# Patient Record
Sex: Female | Born: 1935 | Race: White | Hispanic: No | Smoking: Former smoker
Health system: Southern US, Community
[De-identification: ages and names within clinical notes are randomized; demographics above are authoritative.]

## PROBLEM LIST (undated history)

## (undated) DIAGNOSIS — R0683 Snoring: Secondary | ICD-10-CM

## (undated) DIAGNOSIS — I10 Essential (primary) hypertension: Secondary | ICD-10-CM

## (undated) DIAGNOSIS — R911 Solitary pulmonary nodule: Secondary | ICD-10-CM

## (undated) DIAGNOSIS — I480 Paroxysmal atrial fibrillation: Secondary | ICD-10-CM

## (undated) DIAGNOSIS — IMO0002 Reserved for concepts with insufficient information to code with codable children: Secondary | ICD-10-CM

## (undated) DIAGNOSIS — S72142A Displaced intertrochanteric fracture of left femur, initial encounter for closed fracture: Secondary | ICD-10-CM

## (undated) DIAGNOSIS — C169 Malignant neoplasm of stomach, unspecified: Secondary | ICD-10-CM

## (undated) DIAGNOSIS — F0390 Unspecified dementia without behavioral disturbance: Secondary | ICD-10-CM

## (undated) DIAGNOSIS — I495 Sick sinus syndrome: Secondary | ICD-10-CM

## (undated) DIAGNOSIS — S72141A Displaced intertrochanteric fracture of right femur, initial encounter for closed fracture: Secondary | ICD-10-CM

## (undated) HISTORY — DX: Snoring: R06.83

## (undated) HISTORY — PX: STOMACH SURGERY: SHX791

## (undated) HISTORY — DX: Paroxysmal atrial fibrillation: I48.0

## (undated) HISTORY — DX: Essential (primary) hypertension: I10

## (undated) HISTORY — DX: Sick sinus syndrome: I49.5

## (undated) HISTORY — DX: Reserved for concepts with insufficient information to code with codable children: IMO0002

## (undated) HISTORY — DX: Malignant neoplasm of stomach, unspecified: C16.9

## (undated) HISTORY — DX: Unspecified dementia, unspecified severity, without behavioral disturbance, psychotic disturbance, mood disturbance, and anxiety: F03.90

---

## 1999-11-26 ENCOUNTER — Encounter: Payer: Self-pay | Admitting: Orthopedic Surgery

## 1999-11-26 ENCOUNTER — Encounter: Payer: Self-pay | Admitting: Emergency Medicine

## 1999-11-26 ENCOUNTER — Emergency Department (HOSPITAL_COMMUNITY): Admission: EM | Admit: 1999-11-26 | Discharge: 1999-11-26 | Payer: Self-pay | Admitting: Emergency Medicine

## 2000-01-05 ENCOUNTER — Other Ambulatory Visit: Admission: RE | Admit: 2000-01-05 | Discharge: 2000-01-05 | Payer: Self-pay | Admitting: Obstetrics and Gynecology

## 2000-02-01 ENCOUNTER — Encounter: Admission: RE | Admit: 2000-02-01 | Discharge: 2000-02-01 | Payer: Self-pay | Admitting: Oncology

## 2000-02-01 ENCOUNTER — Encounter: Payer: Self-pay | Admitting: Oncology

## 2000-07-05 ENCOUNTER — Encounter (INDEPENDENT_AMBULATORY_CARE_PROVIDER_SITE_OTHER): Payer: Self-pay | Admitting: Specialist

## 2000-07-05 ENCOUNTER — Ambulatory Visit (HOSPITAL_COMMUNITY): Admission: RE | Admit: 2000-07-05 | Discharge: 2000-07-05 | Payer: Self-pay | Admitting: Gastroenterology

## 2001-01-17 ENCOUNTER — Encounter: Payer: Self-pay | Admitting: Internal Medicine

## 2001-01-17 ENCOUNTER — Encounter: Admission: RE | Admit: 2001-01-17 | Discharge: 2001-01-17 | Payer: Self-pay | Admitting: Internal Medicine

## 2001-03-20 ENCOUNTER — Other Ambulatory Visit: Admission: RE | Admit: 2001-03-20 | Discharge: 2001-03-20 | Payer: Self-pay | Admitting: Internal Medicine

## 2001-05-04 ENCOUNTER — Encounter: Payer: Self-pay | Admitting: Oncology

## 2001-05-04 ENCOUNTER — Ambulatory Visit (HOSPITAL_COMMUNITY): Admission: RE | Admit: 2001-05-04 | Discharge: 2001-05-04 | Payer: Self-pay | Admitting: Oncology

## 2001-05-21 ENCOUNTER — Encounter: Payer: Self-pay | Admitting: Oncology

## 2001-05-21 ENCOUNTER — Ambulatory Visit (HOSPITAL_COMMUNITY): Admission: RE | Admit: 2001-05-21 | Discharge: 2001-05-21 | Payer: Self-pay | Admitting: Oncology

## 2002-05-03 ENCOUNTER — Ambulatory Visit (HOSPITAL_COMMUNITY): Admission: RE | Admit: 2002-05-03 | Discharge: 2002-05-03 | Payer: Self-pay | Admitting: Gastroenterology

## 2002-05-03 ENCOUNTER — Encounter (INDEPENDENT_AMBULATORY_CARE_PROVIDER_SITE_OTHER): Payer: Self-pay | Admitting: *Deleted

## 2002-07-08 ENCOUNTER — Encounter (INDEPENDENT_AMBULATORY_CARE_PROVIDER_SITE_OTHER): Payer: Self-pay | Admitting: *Deleted

## 2002-07-08 ENCOUNTER — Ambulatory Visit (HOSPITAL_COMMUNITY): Admission: RE | Admit: 2002-07-08 | Discharge: 2002-07-08 | Payer: Self-pay | Admitting: Gastroenterology

## 2005-09-06 ENCOUNTER — Ambulatory Visit: Payer: Self-pay | Admitting: Oncology

## 2005-09-12 ENCOUNTER — Ambulatory Visit (HOSPITAL_COMMUNITY): Admission: RE | Admit: 2005-09-12 | Discharge: 2005-09-12 | Payer: Self-pay | Admitting: Oncology

## 2005-12-29 ENCOUNTER — Encounter: Admission: RE | Admit: 2005-12-29 | Discharge: 2006-01-02 | Payer: Self-pay | Admitting: Internal Medicine

## 2006-07-04 ENCOUNTER — Encounter: Admission: RE | Admit: 2006-07-04 | Discharge: 2006-07-04 | Payer: Self-pay | Admitting: Internal Medicine

## 2006-08-22 ENCOUNTER — Encounter: Admission: RE | Admit: 2006-08-22 | Discharge: 2006-08-22 | Payer: Self-pay | Admitting: Internal Medicine

## 2006-09-08 ENCOUNTER — Ambulatory Visit: Payer: Self-pay | Admitting: Oncology

## 2006-09-22 ENCOUNTER — Encounter (INDEPENDENT_AMBULATORY_CARE_PROVIDER_SITE_OTHER): Payer: Self-pay | Admitting: *Deleted

## 2006-09-22 ENCOUNTER — Encounter: Admission: RE | Admit: 2006-09-22 | Discharge: 2006-09-22 | Payer: Self-pay | Admitting: Internal Medicine

## 2007-04-06 ENCOUNTER — Encounter: Admission: RE | Admit: 2007-04-06 | Discharge: 2007-04-06 | Payer: Self-pay | Admitting: Internal Medicine

## 2007-04-16 ENCOUNTER — Encounter: Admission: RE | Admit: 2007-04-16 | Discharge: 2007-04-16 | Payer: Self-pay | Admitting: Internal Medicine

## 2007-08-07 ENCOUNTER — Encounter: Admission: RE | Admit: 2007-08-07 | Discharge: 2007-11-05 | Payer: Self-pay | Admitting: Endocrinology

## 2007-09-13 ENCOUNTER — Ambulatory Visit: Payer: Self-pay | Admitting: Oncology

## 2007-09-18 LAB — COMPREHENSIVE METABOLIC PANEL
Albumin: 3.9 g/dL (ref 3.5–5.2)
Alkaline Phosphatase: 139 U/L — ABNORMAL HIGH (ref 39–117)
BUN: 11 mg/dL (ref 6–23)
CO2: 22 mEq/L (ref 19–32)
Calcium: 9.1 mg/dL (ref 8.4–10.5)
Glucose, Bld: 193 mg/dL — ABNORMAL HIGH (ref 70–99)
Potassium: 4.5 mEq/L (ref 3.5–5.3)
Total Protein: 6.7 g/dL (ref 6.0–8.3)

## 2007-09-18 LAB — CEA: CEA: 2.8 ng/mL (ref 0.0–5.0)

## 2007-09-18 LAB — LACTATE DEHYDROGENASE: LDH: 233 U/L (ref 94–250)

## 2007-09-18 LAB — CBC WITH DIFFERENTIAL/PLATELET
Basophils Absolute: 0.1 10*3/uL (ref 0.0–0.1)
Eosinophils Absolute: 0 10*3/uL (ref 0.0–0.5)
HGB: 12.7 g/dL (ref 11.6–15.9)
MCV: 90.2 fL (ref 81.0–101.0)
MONO#: 0.5 10*3/uL (ref 0.1–0.9)
MONO%: 7.7 % (ref 0.0–13.0)
NEUT#: 5.3 10*3/uL (ref 1.5–6.5)
Platelets: 310 10*3/uL (ref 145–400)
RDW: 12.3 % (ref 11.3–14.5)
WBC: 6.7 10*3/uL (ref 3.9–10.0)

## 2007-12-13 HISTORY — PX: HIP SURGERY: SHX245

## 2008-01-04 ENCOUNTER — Encounter: Admission: RE | Admit: 2008-01-04 | Discharge: 2008-01-04 | Payer: Self-pay | Admitting: Internal Medicine

## 2008-06-11 DIAGNOSIS — S72141A Displaced intertrochanteric fracture of right femur, initial encounter for closed fracture: Secondary | ICD-10-CM

## 2008-06-11 HISTORY — DX: Displaced intertrochanteric fracture of right femur, initial encounter for closed fracture: S72.141A

## 2008-06-29 ENCOUNTER — Inpatient Hospital Stay (HOSPITAL_COMMUNITY): Admission: EM | Admit: 2008-06-29 | Discharge: 2008-07-03 | Payer: Self-pay | Admitting: Emergency Medicine

## 2008-07-14 ENCOUNTER — Encounter: Admission: RE | Admit: 2008-07-14 | Discharge: 2008-07-14 | Payer: Self-pay | Admitting: Internal Medicine

## 2011-04-12 ENCOUNTER — Other Ambulatory Visit: Payer: Self-pay | Admitting: Gastroenterology

## 2011-04-12 DIAGNOSIS — R634 Abnormal weight loss: Secondary | ICD-10-CM

## 2011-04-12 DIAGNOSIS — D649 Anemia, unspecified: Secondary | ICD-10-CM

## 2011-04-19 ENCOUNTER — Other Ambulatory Visit: Payer: Self-pay

## 2011-04-22 ENCOUNTER — Other Ambulatory Visit: Payer: Self-pay

## 2011-04-26 NOTE — Op Note (Signed)
[name], [name]                  ACCOUNT NO.:  [id number]   MEDICAL RECORD NO.:  [id number]          PATIENT TYPE:  INP   LOCATION:  1604                         FACILITY:  [facility]   PHYSICIAN:  Eulas Post, MD    DATE OF BIRTH:  [birth year]   DATE OF PROCEDURE:  06/29/2008  DATE OF DISCHARGE:                               OPERATIVE REPORT   PREOPERATIVE DIAGNOSIS:  Right intertrochanteric hip fracture.   POSTOPERATIVE DIAGNOSIS:  Right intertrochanteric hip fracture.   OPERATIVE PROCEDURE:  Closed reduction and intramedullary fixation of  the right intertrochanteric hip fracture.   ATTENDING PHYSICIAN:  Eulas Post, MD   FIRST ASSISTANT:  Genene Churn. Denton Meek.   OPERATIVE IMPLANTS:  Synthes trochanteric femoral nail size 380 x 11 mm  and a size 95 mm helical blade.   PREOPERATIVE INDICATIONS:  [name] is a 77 year old woman who  broke her right hip.  She elected to undergo the above named procedures.  The risks, benefits and alternatives were discussed preoperatively  including but not limited to risks of infection, bleeding, nerve injury,  blood transfusion, blood clots, malunion, nonunion, hardware prominence,  hardware failure, periprosthetic fracture, cardiopulmonary complications  among others and they were willing to proceed.   PROCEDURE IN DETAIL:  The patient was brought to the operating room and  placed in supine position.  Closed reduction was performed.  Fracture  table was utilized.  Right lower extremity was prepped and draped in the  usual sterile fashion.  A small incision was made proximally and a guide  pin was introduced into the appropriate location through the greater  trochanteric fractured piece and then across the intertrochanteric  fracture.  There were two different fracture lines, one of the basilar  neck and one of the greater trochanter.  Nevertheless, I was able to  reduce the greater trochanteric piece and place the guidewire in  the  appropriate position and open the proximal femur and then place the  guidewire.  It was somewhat tight going down so I reamed with a size 12  and then placed the nail down without difficulty.  Appropriate length  was selected in order to bypass the cortical bone and get into the  distal cancellous metaphyseal bone.  We then placed our helical blade in  the appropriate position.  Reduction maneuver was utilized in order to  gain compression at this junction.  We then removed all of the jig  apparatus and irrigated the wounds copiously and took final x-rays and  closed with Vicryl and Monocryl and Steri-Strips and gauze.  The patient  was awakened and returned to the PACU in stable satisfactory condition.  There were no complications.  The patient tolerated the procedure well.     Eulas Post, MD  Electronically Signed    JPL/MEDQ  D:  06/29/2008  T:  06/29/2008  Job:  [id number]

## 2011-04-26 NOTE — H&P (Signed)
[name], [name]                  ACCOUNT NO.:  [id number]   MEDICAL RECORD NO.:  [id number]          PATIENT TYPE:  EMS   LOCATION:  ED                           FACILITY:  [facility]   PHYSICIAN:  Eulas Post, MD    DATE OF BIRTH:  June 19, 1926   DATE OF ADMISSION:  06/28/2008  DATE OF DISCHARGE:                              HISTORY & PHYSICAL   CHIEF COMPLAINT:  Right hip pain.   HISTORY OF PRESENT ILLNESS:  A 77 year old white female who fell outside  at [facility] this evening.  She said that she tripped, landing on  her right hip.  She was unable to weightbear after this incident.  X-  rays in the emergency room showed a right hip intertrochanteric  fracture.  No other injuries.  His primary care physician is Geoffry Paradise, M.D.  He has a history of gastric cancer with resection in 1991  and his Oncologist is Genene Churn. Cyndie Chime, M.D.   MEDICATIONS:  1. Lisinopril 10 mg p.o. daily.  2. Aspirin 81 mg p.o. daily.  3. B12 vitamin.  4. Multivitamin.  5. Citracal.  6. Vitamin D.  7. Aricept 10 mg p.o. daily.  8. Vitamin B complex.  9. Forteo 20 mcg injection daily.  10.Flax seed oil.  11.Calcium supplement.   ALLERGIES:  No known drug allergies.   PAST MEDICAL/SURGICAL HISTORY:  1. Gastric cancer with resection in 1991.  2. Hiatal hernia.  3. Lumbar degenerative disk disease.  4. L4 vertebroplasty in 2007.  5. Prolapsed uterus.  6. Anemia.  7. Hypertension.  8. Osteoporosis.  9. Alzheimer's dementia.   FAMILY HISTORY:  Noncontributory.   SOCIAL HISTORY:  Patient married Investment banker, operational.  Quit smoking several years  ago.  Drinks a glass of wine daily.  She has one son and one daughter  living.  One son deceased.   REVIEW OF SYSTEMS:  Patient denies fever or chills.  Denies any cardiac,  pulmonary, GI, GU, neurological issues.   PHYSICAL EXAMINATION:  VITAL SIGNS:  Temperature 97, pulse 60,  respirations 16, blood pressure 161/63.  GENERAL APPEARANCE:  A  pleasant, elderly white female, alert and  oriented x3 and lying comfortably on a stretcher.  HEENT:  Head is normocephalic and atraumatic.  PERRLA and EOMI.  Oral  mucosa pink and moist.  NECK:  Good range of motion.  Soft and nontender.  Carotids are intact.  LUNGS:  Clear to auscultation bilaterally.  No wheezing, rhonchi or  rales.  BREASTS:  Not examined.  CARDIOVASCULAR:  Regular rate and rhythm.  S1 and S2.  NO murmurs, rubs,  or gallops noted.  ABDOMEN:  Round, nondistended.  NABS x4.  Soft and nontender.  GU:  Not examined.  EXTREMITIES:  Right lower extremity shortened and externally rotated,  neurovascularly intact.  Calves nontender.  SKIN:  Warm and dry.   X-ray showed a right hip intertrochanteric fracture.  Some degenerative  changes of the right hip.  She has end-stage DJD left hip.   LABORATORY DATA:  A wbc of 7.7, hemoglobin 9.6, hematocrit 29, platelets  244.  PT 12.8, INR 1, PTT 25.  Sodium 135, potassium 4.9, chloride 108,  CO2 18, glucose 108, BUN 31, creatinine 1.07, calcium 9.2, total protein  6.3, albuterol 3.2, AST 28.   Chest x-ray showed cardiomegaly with no definite active process.   ASSESSMENT:  Right hip fracture.   PLAN:  Will admit patient to the floor this evening.  We will schedule  her for open reduction and internal fixation procedure right hip in the  a.m.  Surgical procedure along with potential risks and complications  discussed with her along with her husband and son who is present.  All  questions answered.  She will be placed in Buck's traction tonight.  She  is n.p.o.      Genene Churn. Denton Meek.      Eulas Post, MD  Electronically Signed    JMO/MEDQ  D:  06/29/2008  T:  06/29/2008  Job:  [id number]

## 2011-04-26 NOTE — Discharge Summary (Signed)
[name], [name]                  ACCOUNT NO.:  [id number]   MEDICAL RECORD NO.:  [id number]          PATIENT TYPE:  INP   LOCATION:  1604                         FACILITY:  [facility]   PHYSICIAN:  Eulas Post, MD    DATE OF BIRTH:  Aug 02, 1926   DATE OF ADMISSION:  06/28/2008  DATE OF DISCHARGE:                               DISCHARGE SUMMARY   ADMISSION DIAGNOSIS:  Right intertrochanteric hip fracture.   DISCHARGE DIAGNOSIS:  Right intertrochanteric hip fracture.   SECONDARY DIAGNOSES:  1. Acute postoperative blood loss anemia.  2. Hypertension.  3. Mild dementia.  4. History of an L4 vertebroplasty.  5. Osteoporosis.  6. History of gastric cancer.   DISCHARGE INSTRUCTIONS:  She can be weightbearing as tolerated.  She is  okay to shower after July 26.  Until then, she should sponge bath.  Her  dressings can be changed every other day or as needed.   DISCHARGE MEDICATIONS:  Coumadin for a period of 4 weeks after her  surgery.  She can also resume all of her other medications.   HOSPITAL COURSE:  [name] is a 77 year old woman who had a fall  and a right hip fracture.  She underwent open reduction internal  fixation.  She tolerated it well.  She had acute postoperative anemia  and had a blood transfusion.  She was given perioperative Ancef.  She  was walking with physical therapy and making slow progress.  She was  given Coumadin for DVT prophylaxis.  She is planned to be discharged to  skilled nursing with followup with me in approximately 2 weeks.  There  were no complications, and the patient benefitted maximally from her  hospital stay.      Eulas Post, MD  Electronically Signed     JPL/MEDQ  D:  07/03/2008  T:  07/03/2008  Job:  [id number]

## 2011-04-29 NOTE — Procedures (Signed)
[facility] [place]  Patient:    [name], [name] Visit Number: [id number] MRN: [id number]          Service Type: Attending:  Petra Kuba, M.D. Dictated by:   Petra Kuba, M.D. Proc. Date: 07/08/02   CC:         Genene Churn. Cyndie Chime, M.D.  Richard A. Jacky Kindle, M.D.   Procedure Report  PROCEDURE PERFORMED:  Colonoscopy with biopsy.  ENDOSCOPIST:  Petra Kuba, M.D.  INDICATIONS FOR PROCEDURE:  Patient with recurrent anemia, nondiagnostic endoscopy.  Want to make sure no colonoscopic abnormalities.  Consent was signed after risks, benefits, methods, and options were thoroughly discussed multiple times in the past.  MEDICATIONS USED:  Demerol 50 mg, Versed 5 mg.  DESCRIPTION OF PROCEDURE:  Rectal inspection was pertinent for external hemorrhoids.  Digital exam was negative.  Video pediatric adjustable colonoscope was inserted and advanced around the colon to the cecum.  This did require some abdominal pressure but no position changes.  On insertion no obvious abnormality was seen but some left-sided diverticula.  The cecum was identified by the appendiceal orifice and the ileocecal valve.  In fact the scope was inserted a short ways in the terminal ileum which was normal except for a rare tiny erosion, probably aspirin induced.  No other abnormalities were seen in the distal terminal ileum.  The scope was slowly withdrawn.  The prep was slowly withdrawn.  The prep was adequate.  There was some liquid stool that required washing and suctioning.  In the cecum a tiny polyp was seen which was cold biopsied x 2.  The scope was further withdrawn.  Other than an occasional left-sided diverticula, no other abnormalities were seen. Once back in the rectum the scope was retroflexed pertinent for some internal hemorrhoids.  The scope was straightened, air was suctioned and scope removed. The patient tolerated the procedure well.  There was no obvious  immediate complication.  ENDOSCOPIC DIAGNOSIS: 1. Internal and external hemorrhoids. 2. Left occasional diverticula. 3. Tiny cecal polyp cold biopsied. 4. Rare tiny terminal ileum erosion probably aspirin induced. 5. Otherwise within normal limits to the terminal ileum.  PLAN:  Await pathology to determine future colonic screening.  Follow-up p.r.n. or in two to three months.  Otherwise return care to Dr. Cyndie Chime and Dr. Jacky Kindle for further work-up and plans.  Considerations of small bowel follow-through or CAT scan if GI source is still a question although with her recent guaiac negativity, possibly would look elsewhere. Dictated by:   Petra Kuba, M.D. Attending:  Petra Kuba, M.D. DD:  07/08/02 TD:  07/09/02 Job: [id number]

## 2011-05-11 ENCOUNTER — Ambulatory Visit
Admission: RE | Admit: 2011-05-11 | Discharge: 2011-05-11 | Disposition: A | Discharge status: Home / Self Care | Payer: Medicare Other | Source: Ambulatory Visit | Attending: Gastroenterology | Admitting: Gastroenterology

## 2011-05-11 DIAGNOSIS — D649 Anemia, unspecified: Secondary | ICD-10-CM

## 2011-05-11 DIAGNOSIS — R634 Abnormal weight loss: Secondary | ICD-10-CM

## 2011-05-17 ENCOUNTER — Ambulatory Visit
Admission: RE | Admit: 2011-05-17 | Discharge: 2011-05-17 | Disposition: A | Discharge status: Home / Self Care | Payer: Medicare Other | Source: Ambulatory Visit | Attending: Gastroenterology | Admitting: Gastroenterology

## 2011-05-17 ENCOUNTER — Other Ambulatory Visit: Payer: Self-pay | Admitting: Gastroenterology

## 2011-05-17 DIAGNOSIS — R9389 Abnormal findings on diagnostic imaging of other specified body structures: Secondary | ICD-10-CM

## 2011-05-19 ENCOUNTER — Telehealth: Payer: Self-pay

## 2011-05-19 NOTE — Telephone Encounter (Signed)
Called Dr. Marlane Hatcher office.  Spoke with [employer].  States Dr. Ewing Schlein is requesting pt be seen sometime next week by any pulm dr.  Pt had a CT abd/pelvis which rec a CT Chest.  CT Chest adnormal - ? Lung ca.  Ok with appt in [place] with RA but if this will not work for pt/family WCB to change appt.  Consult scheduled for 05/24/11 at 3:30pm in [place].  Barb advised pt will need to arrive 15 minutes early and bring all current meds.  She verbalized understanding of this and will inform pt.  Also, will fax records to [phone].  CT was done at [facility].

## 2011-05-20 ENCOUNTER — Encounter: Payer: Self-pay | Admitting: Pulmonary Disease

## 2011-05-23 ENCOUNTER — Encounter: Payer: Self-pay | Admitting: Pulmonary Disease

## 2011-05-24 ENCOUNTER — Encounter: Payer: Self-pay | Admitting: Pulmonary Disease

## 2011-05-24 ENCOUNTER — Ambulatory Visit (INDEPENDENT_AMBULATORY_CARE_PROVIDER_SITE_OTHER): Payer: Medicare Other | Admitting: Pulmonary Disease

## 2011-05-24 VITALS — BP 108/60 | HR 66 | Temp 97.9°F | Ht <= 58 in | Wt 95.0 lb

## 2011-05-24 DIAGNOSIS — J984 Other disorders of lung: Secondary | ICD-10-CM

## 2011-05-24 DIAGNOSIS — R911 Solitary pulmonary nodule: Secondary | ICD-10-CM

## 2011-05-24 NOTE — Progress Notes (Signed)
  Subjective:    Patient ID: [name], female    DOB: 08-18-1926, 77 y.o.   MRN: [id number]  HPI 84/F ,ex smoker with mild dementia, for evaluation of lung nodule. She was undergoing evaluation for Anemia by Dr Rhona Leavens. Ct abdomen partially imaged a LLL nodule. This was clarified to be 1.5 x 1.6 cm x 2.4 cm  spiculated nodule with a subtle air bronchogram on CT chest - no contrast. A cluster. of nodules were noted in the RML .No mediastinal or hialr Lns were noted. Other findings included renal atrophy, extensive vascular calcification & vertebral hemangiomas. She reports a Dry cough & 10 lb wt loss , although she has never been heavy, sjhe weighed 10 lbs prior to her gastrectomy in '92 for gastric CA. Son reports mild dementia - on aricept., short term memory deficits + BUn/cr 50/1.8 noted.    Review of Systems  Constitutional: Positive for appetite change. Negative for fever and unexpected weight change.  HENT: Positive for sneezing. Negative for ear pain, nosebleeds, congestion, sore throat, rhinorrhea, trouble swallowing, dental problem, postnasal drip and sinus pressure.   Eyes: Negative for redness and itching.  Respiratory: Positive for shortness of breath. Negative for cough, chest tightness and wheezing.   Cardiovascular: Negative for palpitations and leg swelling.  Gastrointestinal: Negative for nausea and vomiting.  Genitourinary: Negative for dysuria.  Musculoskeletal: Positive for joint swelling.  Skin: Negative for rash.  Neurological: Negative for headaches.  Hematological: Does not bruise/bleed easily.  Psychiatric/Behavioral: Positive for dysphoric mood. The patient is nervous/anxious.        Objective:   Physical Exam Gen. Pleasant, well-nourished, in no distress, normal affect ENT - no lesions, no post nasal drip Neck: No JVD, no thyromegaly, no carotid bruits Lungs: no use of accessory muscles, no dullness to percussion, clear without rales or rhonchi    Cardiovascular: Rhythm regular, heart sounds  normal, no murmurs or gallops, no peripheral edema Abdomen: soft and non-tender, no hepatosplenomegaly, BS normal. Musculoskeletal: No deformities, no cyanosis or clubbing Neuro:  alert, non focal         Assessment & Plan:

## 2011-05-24 NOTE — Patient Instructions (Addendum)
PET scan-Friday June 03, 2011. Please arrive at [facility] (main entrance) at 1 pm. Nothing to eat or drink after 7am (no gum, no sugar, no candy, etc...). Will take approximately 2 hours total. Lets discuss your options once we obtain this - surgery versus radiation

## 2011-05-26 NOTE — Assessment & Plan Note (Signed)
Very suspicious for primary lung malignancy although other etiologies are possible. Cluster of nodules in RML appear either post inflammatory or post infectious. Will proceed with PET scan. If hypermetabolic, then will offer diagnostic options .  Will obtian PFTs in anticipation. I discussed the prognosis of this nodule without intervention should this turn out to be malignancy. We will decide on the best course after the PET scan

## 2011-05-27 ENCOUNTER — Telehealth: Payer: Self-pay | Admitting: Pulmonary Disease

## 2011-05-27 DIAGNOSIS — C3490 Malignant neoplasm of unspecified part of unspecified bronchus or lung: Secondary | ICD-10-CM

## 2011-05-27 NOTE — Telephone Encounter (Signed)
Called and spoke with pt's brother, [name].  [name] is wanting to speak to Dr. Vassie Loll directly regarding pt's condition.  Pt informed brother, [name] that she has lung cancer.  Brother wanted more information but states pt has "alzheimer's" and couldn't remember anything else.  Brother is confused and has questions and is requesting RA speak to him.  Will forward message to RA to address.

## 2011-05-27 NOTE — Telephone Encounter (Signed)
I have spoken to pts son in great detail. [name] can obtain information from him. Please also arrange for this pt to get pFTs before FU visit on Friday

## 2011-05-27 NOTE — Telephone Encounter (Signed)
Spoke with [name] and notified of recs per RA. He verbalized understanding. Order sent to [facility] for PFT's to be sched

## 2011-06-03 ENCOUNTER — Telehealth: Payer: Self-pay | Admitting: Pulmonary Disease

## 2011-06-03 ENCOUNTER — Ambulatory Visit (INDEPENDENT_AMBULATORY_CARE_PROVIDER_SITE_OTHER): Payer: Medicare Other | Admitting: Pulmonary Disease

## 2011-06-03 ENCOUNTER — Encounter: Payer: Self-pay | Admitting: Pulmonary Disease

## 2011-06-03 ENCOUNTER — Encounter (HOSPITAL_COMMUNITY)
Admission: RE | Admit: 2011-06-03 | Discharge: 2011-06-03 | Disposition: A | Discharge status: Home / Self Care | Payer: Medicare Other | Source: Ambulatory Visit | Attending: Pulmonary Disease | Admitting: Pulmonary Disease

## 2011-06-03 VITALS — BP 122/64 | HR 56 | Temp 97.4°F | Ht 64.0 in | Wt 95.8 lb

## 2011-06-03 DIAGNOSIS — Z85028 Personal history of other malignant neoplasm of stomach: Secondary | ICD-10-CM | POA: Insufficient documentation

## 2011-06-03 DIAGNOSIS — I2510 Atherosclerotic heart disease of native coronary artery without angina pectoris: Secondary | ICD-10-CM | POA: Insufficient documentation

## 2011-06-03 DIAGNOSIS — I517 Cardiomegaly: Secondary | ICD-10-CM | POA: Insufficient documentation

## 2011-06-03 DIAGNOSIS — J984 Other disorders of lung: Secondary | ICD-10-CM | POA: Insufficient documentation

## 2011-06-03 DIAGNOSIS — R911 Solitary pulmonary nodule: Secondary | ICD-10-CM

## 2011-06-03 MED ORDER — FLUDEOXYGLUCOSE F - 18 (FDG) INJECTION
17.9000 | Freq: Once | INTRAVENOUS | Status: AC | PRN
  Administered 2011-06-03: 17.9 via INTRAVENOUS

## 2011-06-03 NOTE — Assessment & Plan Note (Signed)
LLL spiculated on CT 6/12 PET 06/03/11 low levl FDG uptake -SUV 2 I explained that this indicates benign etiology. However bronchialveolar carcinoma or other low grade tumor could also be pET negative. Granulomatous nodule remains possible. The various options of biopsy including bronchoscopy, CT guided needle aspiration and surgical biopsy were discussed.The risks of each procedure including coughing, bleeding and the  chances of lung puncture requiring chest tube were discussed in great detail. The benefits & alternatives including serial follow up were also discussed. The pt & family would prefer the wait &w atch approach. Rpt CT scan was scheduled x 3 months

## 2011-06-03 NOTE — Patient Instructions (Signed)
We discussed options of biopsy versus wait & watch approach CT scan in 3 months & follow up

## 2011-06-03 NOTE — Progress Notes (Signed)
  Subjective:    Patient ID: [name], female    DOB: 1926/11/22, 77 y.o.   MRN: [id number]  HPI 84/F ,ex smoker with mild dementia, for evaluation of lung nodule.  She was undergoing evaluation for Anemia by Dr Rhona Leavens. Ct abdomen partially imaged a LLL nodule. This was clarified to be 1.5 x 1.6 cm x 2.4 cm spiculated nodule with a subtle air bronchogram on CT chest - no contrast. A cluster. of nodules were noted in the RML .No mediastinal or hialr Lns were noted.  Other findings included renal atrophy, extensive vascular calcification & vertebral hemangiomas.  She reports a Dry cough & 10 lb wt loss , although she has never been heavy, she weighed 10 lbs prior to her gastrectomy in '92 for gastric CA.  Son reports mild dementia - on aricept., short term memory deficits +  He reports an area of water exposure in the house & possible mold in the walls BUn/cr 50/1.8 noted.  PET scan 06/03/11 >> The left lower lobe 2 cm mass demonstrates low level F D G uptake with maximal SUV of 1.8.    Review of Systems Pt denies any significant  nasal congestion or excess secretions, fever, chills, sweats, unintended wt loss, pleuritic or exertional cp, orthopnea pnd or leg swelling.  Pt also denies any obvious fluctuation in symptoms with weather or environmental change or other alleviating or aggravating factors.    Pt denies  having early am exacerbations or coughing/wheezing/ or dyspnea       Objective:   Physical Exam    Gen. Pleasant, well-nourished, in no distress ENT - no lesions, no post nasal drip Neck: No JVD, no thyromegaly, no carotid bruits Lungs: no use of accessory muscles, no dullness to percussion, clear without rales or rhonchi  Cardiovascular: Rhythm regular, heart sounds  normal, no murmurs or gallops, no peripheral edema Musculoskeletal: No deformities, no cyanosis or clubbing      Assessment & Plan:

## 2011-06-03 NOTE — Telephone Encounter (Signed)
Error - duplicate msg

## 2011-06-06 NOTE — Telephone Encounter (Signed)
ATC PT'S SON (WHO HAD REQUESTED THAT 3RD PARTY RELEASE BE MAILED TO "HIS SISTER". NO ANSWER AND NO ANSW MACHINE AVAIL. WILL INFORM THEM WHEN THEY CALL THAT PER KRISTA- THIS FORM MUST BE SIGNED AND WITNESSED "HERE IN OUR OFFICE". CANNOT BE MAILED. Hazel Sams

## 2011-07-15 ENCOUNTER — Other Ambulatory Visit: Payer: Self-pay | Admitting: Nephrology

## 2011-07-15 DIAGNOSIS — N184 Chronic kidney disease, stage 4 (severe): Secondary | ICD-10-CM

## 2011-07-18 ENCOUNTER — Ambulatory Visit
Admission: RE | Admit: 2011-07-18 | Discharge: 2011-07-18 | Disposition: A | Discharge status: Home / Self Care | Payer: Medicare Other | Source: Ambulatory Visit | Attending: Nephrology | Admitting: Nephrology

## 2011-07-18 DIAGNOSIS — N184 Chronic kidney disease, stage 4 (severe): Secondary | ICD-10-CM

## 2011-08-23 ENCOUNTER — Encounter (HOSPITAL_COMMUNITY): Payer: Medicare Other

## 2011-08-24 ENCOUNTER — Encounter (HOSPITAL_COMMUNITY)
Admission: RE | Admit: 2011-08-24 | Discharge: 2011-08-24 | Disposition: A | Discharge status: Home / Self Care | Payer: Medicare Other | Source: Ambulatory Visit | Attending: Nephrology | Admitting: Nephrology

## 2011-08-24 ENCOUNTER — Other Ambulatory Visit: Payer: Self-pay | Admitting: Nephrology

## 2011-08-24 DIAGNOSIS — D638 Anemia in other chronic diseases classified elsewhere: Secondary | ICD-10-CM | POA: Insufficient documentation

## 2011-08-24 DIAGNOSIS — N184 Chronic kidney disease, stage 4 (severe): Secondary | ICD-10-CM | POA: Insufficient documentation

## 2011-08-24 LAB — POCT HEMOGLOBIN-HEMACUE: Hemoglobin: 9.1 g/dL — ABNORMAL LOW (ref 12.0–15.0)

## 2011-08-30 ENCOUNTER — Telehealth: Payer: Self-pay | Admitting: Pulmonary Disease

## 2011-08-30 NOTE — Telephone Encounter (Signed)
lmomtcb to give rose # in ct to call to reschedule [phone]

## 2011-09-01 NOTE — Telephone Encounter (Signed)
lmomtcb  

## 2011-09-01 NOTE — Telephone Encounter (Signed)
Pt Son, Nida Boatman returned call-please call back at  [phone]. Antionette Fairy

## 2011-09-01 NOTE — Telephone Encounter (Signed)
lmomtcb for [employer]

## 2011-09-01 NOTE — Telephone Encounter (Signed)
Pt's son Nida Boatman returned call says he's trying to get ct scan for earlier in the day on 10/4 he can be reached at [phone].Raylene Everts

## 2011-09-02 ENCOUNTER — Encounter (HOSPITAL_COMMUNITY): Payer: Medicare Other

## 2011-09-02 NOTE — Telephone Encounter (Signed)
He can call Rose with CT scheduling and see if can get earlier time- [facility]

## 2011-09-05 ENCOUNTER — Other Ambulatory Visit: Payer: Self-pay | Admitting: Nephrology

## 2011-09-05 ENCOUNTER — Encounter (HOSPITAL_COMMUNITY): Payer: Medicare Other

## 2011-09-06 LAB — FERRITIN: Ferritin: 72 ng/mL (ref 10–291)

## 2011-09-06 LAB — IRON AND TIBC: TIBC: 299 ug/dL (ref 250–470)

## 2011-09-07 NOTE — Telephone Encounter (Signed)
lmomtcb for brad

## 2011-09-08 NOTE — Telephone Encounter (Signed)
lmomtcb for brad 

## 2011-09-09 ENCOUNTER — Encounter (HOSPITAL_COMMUNITY): Payer: Medicare Other

## 2011-09-09 LAB — BASIC METABOLIC PANEL
BUN: 16
BUN: 20
BUN: 23
BUN: 26 — ABNORMAL HIGH
CO2: 23
CO2: 24
Calcium: 7.4 — ABNORMAL LOW
Calcium: 7.9 — ABNORMAL LOW
Calcium: 7.9 — ABNORMAL LOW
Chloride: 111
Chloride: 111
Creatinine, Ser: 0.98
Creatinine, Ser: 1.06
GFR calc non Af Amer: 55 — ABNORMAL LOW
GFR calc non Af Amer: >60
Glucose, Bld: 114 — ABNORMAL HIGH
Glucose, Bld: 137 — ABNORMAL HIGH
Glucose, Bld: 140 — ABNORMAL HIGH
Potassium: 5.2 — ABNORMAL HIGH
Potassium: 5.2 — ABNORMAL HIGH
Sodium: 140

## 2011-09-09 LAB — DIFFERENTIAL
Basophils Absolute: 0
Basophils Relative: 0
Eosinophils Relative: 2
Lymphocytes Relative: 14
Monocytes Absolute: 0.6
Neutro Abs: 5.9

## 2011-09-09 LAB — TYPE AND SCREEN: ABO/RH(D): B POS

## 2011-09-09 LAB — CBC
HCT: 18.6 — ABNORMAL LOW
HCT: 27.5 — ABNORMAL LOW
HCT: 29 — ABNORMAL LOW
Hemoglobin: 9.1 — ABNORMAL LOW
Hemoglobin: 9.6 — ABNORMAL LOW
MCHC: 33
MCHC: 33.6
MCV: 91.4
MCV: 92.4
MCV: 92.8
Platelets: 143 — ABNORMAL LOW
Platelets: 145 — ABNORMAL LOW
Platelets: 172
RBC: 3.12 — ABNORMAL LOW
RDW: 14.5
RDW: 15
RDW: 15.3
WBC: 7.7
WBC: 8.1

## 2011-09-09 LAB — PREPARE RBC (CROSSMATCH)

## 2011-09-09 LAB — PROTIME-INR
INR: 1.1
INR: 1.4
Prothrombin Time: 12.8
Prothrombin Time: 13.8
Prothrombin Time: 14.8
Prothrombin Time: 15.3 — ABNORMAL HIGH
Prothrombin Time: 17.7 — ABNORMAL HIGH

## 2011-09-09 LAB — COMPREHENSIVE METABOLIC PANEL
Albumin: 3.2 — ABNORMAL LOW
Alkaline Phosphatase: 113
BUN: 31 — ABNORMAL HIGH
CO2: 18 — ABNORMAL LOW
Chloride: 108
Creatinine, Ser: 1.07
GFR calc non Af Amer: 49 — ABNORMAL LOW
Potassium: 4.9
Total Bilirubin: 0.7

## 2011-09-09 LAB — APTT: aPTT: 25

## 2011-09-12 ENCOUNTER — Ambulatory Visit: Payer: Medicare Other | Admitting: Pulmonary Disease

## 2011-09-12 ENCOUNTER — Telehealth: Payer: Self-pay | Admitting: Pulmonary Disease

## 2011-09-12 NOTE — Telephone Encounter (Signed)
Per protocol I will sign off on message and await call back. Jennifer Castillo, CMA  

## 2011-09-12 NOTE — Telephone Encounter (Signed)
I provided the pt number to rose at [address] to r/s CT. Carron Curie, CMA

## 2011-09-14 ENCOUNTER — Telehealth: Payer: Self-pay | Admitting: Pulmonary Disease

## 2011-09-14 NOTE — Telephone Encounter (Signed)
Pt is scheduled to have a Procrit injection on the same day as her CT without contrast? Please advise. Comer Locket Vassie Loll

## 2011-09-14 NOTE — Telephone Encounter (Signed)
Spoke with pt's Son and notified okay per RA to have ct and proctrit same day. He verbalized understanding and states nothing further needed.

## 2011-09-14 NOTE — Telephone Encounter (Signed)
No problem.

## 2011-09-15 ENCOUNTER — Ambulatory Visit: Payer: Medicare Other | Admitting: Pulmonary Disease

## 2011-09-19 ENCOUNTER — Telehealth: Payer: Self-pay | Admitting: Pulmonary Disease

## 2011-09-19 DIAGNOSIS — R911 Solitary pulmonary nodule: Secondary | ICD-10-CM

## 2011-09-19 NOTE — Telephone Encounter (Signed)
Order placed during last VIist - PLEASE SEE NOTE WITHOUT CONTRAST

## 2011-09-19 NOTE — Telephone Encounter (Signed)
Order placed for pt to have ct chest without contrast on 10/03/11 per son's request.

## 2011-09-19 NOTE — Telephone Encounter (Signed)
I spoke with pt son and he states he wants to get CT set up for 10/22 for pt. Please advise if pt needs ct w/ or w/o contrast, thanks  Carver Fila, CMA

## 2011-09-19 NOTE — Telephone Encounter (Signed)
OK to get CT right after return on 10/22 She can keep appt with me. I will be checking emr

## 2011-09-19 NOTE — Telephone Encounter (Signed)
Called, spoke with pt's son, Nida Boatman.  He would like to take pt on a vacation possibly leaving tomorrow but wouldn't be available for another appt until Oct 22.  However, he is concerned that this would be "irresponsible" because pt was seen by RA on 06/03/11 and was told to have another CT in 3 months and follow up.  He would like Dr. Reginia Naas thoughts on this -- ? Does he think it would be ok to wait until the end of Oct to do the CT scan or should pt go ahead and do this before then?  Also, Dr. Vassie Loll will be out of the office until middle of November -- ? Who would he rec she follow up with?  Dr. Vassie Loll, pls advise.  Thanks!

## 2011-09-20 ENCOUNTER — Other Ambulatory Visit: Payer: Self-pay | Admitting: Nephrology

## 2011-09-20 ENCOUNTER — Encounter (HOSPITAL_COMMUNITY)
Admission: RE | Admit: 2011-09-20 | Discharge: 2011-09-20 | Disposition: A | Discharge status: Home / Self Care | Payer: Medicare Other | Source: Ambulatory Visit | Attending: Nephrology | Admitting: Nephrology

## 2011-09-20 DIAGNOSIS — D638 Anemia in other chronic diseases classified elsewhere: Secondary | ICD-10-CM | POA: Insufficient documentation

## 2011-09-20 DIAGNOSIS — N184 Chronic kidney disease, stage 4 (severe): Secondary | ICD-10-CM | POA: Insufficient documentation

## 2011-09-23 ENCOUNTER — Other Ambulatory Visit: Payer: Medicare Other

## 2011-09-23 ENCOUNTER — Encounter (HOSPITAL_COMMUNITY): Payer: Medicare Other

## 2011-09-23 ENCOUNTER — Ambulatory Visit: Payer: Medicare Other | Admitting: Pulmonary Disease

## 2011-10-04 ENCOUNTER — Encounter (HOSPITAL_COMMUNITY): Payer: Medicare Other

## 2011-10-04 ENCOUNTER — Other Ambulatory Visit: Payer: Self-pay | Admitting: Nephrology

## 2011-10-04 LAB — FERRITIN: Ferritin: 36 ng/mL (ref 10–291)

## 2011-10-04 LAB — IRON AND TIBC: Saturation Ratios: 10 % — ABNORMAL LOW (ref 20–55)

## 2011-10-04 LAB — POCT HEMOGLOBIN-HEMACUE: Hemoglobin: 11.5 g/dL — ABNORMAL LOW (ref 12.0–15.0)

## 2011-10-06 ENCOUNTER — Telehealth: Payer: Self-pay | Admitting: Pulmonary Disease

## 2011-10-06 NOTE — Telephone Encounter (Signed)
Called and spoke with [facility] and offered my help.  Nida Boatman stated he was calling regarding pt's CT scan which is scheduled for Monday 10/29 and possibly wanting to reschedule this but now is unsure if he wants to do that or not.   but Nida Boatman states he has Rose's # at [employer]. And will call her if he wants to change the appt.  Nothing further needed from [place].

## 2011-10-07 ENCOUNTER — Telehealth: Payer: Self-pay | Admitting: Pulmonary Disease

## 2011-10-07 ENCOUNTER — Encounter (HOSPITAL_COMMUNITY): Payer: Medicare Other

## 2011-10-07 NOTE — Telephone Encounter (Signed)
Called, spoke with pt's son, Nida Boatman.  States they were getting ready to leave for [place] but had to postpone this trip now d/t the weather.  If they cont with this trip now as planned, they will not be back until November.  Pt has the CT and OV with RA set up for Nov 20 - he would like to know if it would be ok to wait until this day or should it be done earlier.  Dr. Vassie Loll, pls advise.  Thanks!

## 2011-10-09 NOTE — Telephone Encounter (Signed)
Ok to wait until nov 20

## 2011-10-10 NOTE — Telephone Encounter (Signed)
[email or web address].Jennifer Castillo, CMA  

## 2011-10-11 NOTE — Telephone Encounter (Signed)
LMTCBx2. Evens Meno, CMA  

## 2011-10-12 NOTE — Telephone Encounter (Signed)
lmomtcb  

## 2011-10-13 NOTE — Telephone Encounter (Signed)
Left a detailed msg for Nida Boatman, pt's son, to let him know that it is fine for the pt to wait til Nov 20th to have ct scan done per Dr. Vassie Loll. I asked that he call back to leave a new msg if he had any further questions.

## 2011-10-24 ENCOUNTER — Encounter (HOSPITAL_COMMUNITY): Payer: Medicare Other

## 2011-10-24 ENCOUNTER — Encounter (HOSPITAL_COMMUNITY)
Admission: RE | Admit: 2011-10-24 | Discharge: 2011-10-24 | Disposition: A | Discharge status: Home / Self Care | Payer: Medicare Other | Source: Ambulatory Visit | Attending: Nephrology | Admitting: Nephrology

## 2011-10-24 DIAGNOSIS — D638 Anemia in other chronic diseases classified elsewhere: Secondary | ICD-10-CM | POA: Insufficient documentation

## 2011-10-24 DIAGNOSIS — N184 Chronic kidney disease, stage 4 (severe): Secondary | ICD-10-CM | POA: Insufficient documentation

## 2011-10-24 LAB — IRON AND TIBC
Iron: 82 ug/dL (ref 42–135)
UIBC: 269 ug/dL (ref 125–400)

## 2011-10-24 MED ORDER — FERUMOXYTOL INJECTION 510 MG/17 ML
510.0000 mg | Freq: Once | INTRAVENOUS | Status: AC
  Administered 2011-10-24: 510 mg via INTRAVENOUS

## 2011-10-24 MED ORDER — FERUMOXYTOL INJECTION 510 MG/17 ML
INTRAVENOUS | Status: AC
  Filled 2011-10-24: qty 17

## 2011-10-24 MED ORDER — EPOETIN ALFA 20000 UNIT/ML IJ SOLN
20000.0000 [IU] | INTRAMUSCULAR | Status: DC
  Administered 2011-10-24: 20000 [IU] via SUBCUTANEOUS
  Filled 2011-10-24: qty 1

## 2011-10-24 MED ORDER — EPOETIN ALFA 10000 UNIT/ML IJ SOLN: 20000.0000 [IU] | Freq: Once | INTRAMUSCULAR | Status: DC

## 2011-11-01 ENCOUNTER — Other Ambulatory Visit: Payer: Medicare Other

## 2011-11-01 ENCOUNTER — Ambulatory Visit: Payer: Medicare Other | Admitting: Pulmonary Disease

## 2011-11-07 ENCOUNTER — Other Ambulatory Visit (HOSPITAL_COMMUNITY): Payer: Self-pay | Admitting: *Deleted

## 2011-11-09 ENCOUNTER — Encounter (HOSPITAL_COMMUNITY)
Admission: RE | Admit: 2011-11-09 | Discharge: 2011-11-09 | Disposition: A | Discharge status: Home / Self Care | Payer: Medicare Other | Source: Ambulatory Visit | Attending: Nephrology | Admitting: Nephrology

## 2011-11-09 MED ORDER — EPOETIN ALFA 10000 UNIT/ML IJ SOLN: 20000.0000 [IU] | INTRAMUSCULAR | Status: DC

## 2011-11-09 MED ORDER — EPOETIN ALFA 20000 UNIT/ML IJ SOLN
INTRAMUSCULAR | Status: AC
  Administered 2011-11-09: 20000 [IU]
  Filled 2011-11-09: qty 1

## 2011-11-15 ENCOUNTER — Ambulatory Visit (INDEPENDENT_AMBULATORY_CARE_PROVIDER_SITE_OTHER): Payer: Medicare Other | Admitting: Pulmonary Disease

## 2011-11-15 ENCOUNTER — Ambulatory Visit (INDEPENDENT_AMBULATORY_CARE_PROVIDER_SITE_OTHER)
Admission: RE | Admit: 2011-11-15 | Discharge: 2011-11-15 | Disposition: A | Discharge status: Home / Self Care | Payer: Medicare Other | Source: Ambulatory Visit | Attending: Pulmonary Disease | Admitting: Pulmonary Disease

## 2011-11-15 ENCOUNTER — Encounter: Payer: Self-pay | Admitting: Pulmonary Disease

## 2011-11-15 VITALS — BP 132/68 | HR 50 | Temp 97.7°F | Ht 64.0 in | Wt 105.0 lb

## 2011-11-15 DIAGNOSIS — J984 Other disorders of lung: Secondary | ICD-10-CM

## 2011-11-15 DIAGNOSIS — R911 Solitary pulmonary nodule: Secondary | ICD-10-CM

## 2011-11-15 NOTE — Assessment & Plan Note (Signed)
Given decrease in size of nodule, malignancy unlikely. Would recommend FU CT in 6 m - DD incl granulomas vs scarring

## 2011-11-15 NOTE — Patient Instructions (Signed)
CT chest in 6 months.

## 2011-11-15 NOTE — Progress Notes (Signed)
  Subjective:    Patient ID: [name], female    DOB: [birth year], 77 y.o.   MRN: [id number]  HPI  84/F ,ex smoker with mild dementia, for evaluation of lung nodule.  She was undergoing evaluation for Anemia by Dr Rhona Leavens. Ct abdomen partially imaged a LLL nodule. This was clarified to be 1.5 x 1.6 cm x 2.4 cm spiculated nodule with a subtle air bronchogram on CT chest - no contrast. A cluster. of nodules were noted in the RML .No mediastinal or hialr Lns were noted.  Other findings included renal atrophy, extensive vascular calcification & vertebral hemangiomas.  She reports a Dry cough & 10 lb wt loss , although she has never been heavy, she weighed 10 lbs prior to her gastrectomy in '92 for gastric CA.  Son reports mild dementia - on aricept., short term memory deficits +  He reports an area of water exposure in the house & possible mold in the walls  BUn/cr 50/1.8 noted.  PET scan 06/03/11 >> The left lower lobe 2 cm mass demonstrates low level F D G uptake with maximal SUV of 1.8.   11/15/2011 Trip to [place] Lisinopril dc'd LLL nodule shrunk to 12 mm, RML microndularity C/o cold symptoms, no phlegm, no dyspnea, wheeze   Review of Systems Patient denies significant dyspnea,cough, hemoptysis,  chest pain, palpitations, pedal edema, orthopnea, paroxysmal nocturnal dyspnea, lightheadedness, nausea, vomiting, abdominal or  leg pains      Objective:   Physical Exam  Gen. Pleasant, thin woman in weelchair, in no distress ENT - no lesions, no post nasal drip Neck: No JVD, no thyromegaly, no carotid bruits Lungs: no use of accessory muscles, no dullness to percussion, clear without rales or rhonchi  Cardiovascular: Rhythm regular, heart sounds  normal, no murmurs or gallops, no peripheral edema Musculoskeletal: No deformities, no cyanosis or clubbing        Assessment & Plan:

## 2011-11-16 ENCOUNTER — Telehealth: Payer: Self-pay | Admitting: Pulmonary Disease

## 2011-11-16 NOTE — Telephone Encounter (Signed)
[name] aware results of chest ct received and has been placed in Dr. Vassie Loll look at for his review once he returns to the office on Thurs., 12/6.

## 2011-11-17 NOTE — Telephone Encounter (Signed)
Discussed with pt during OV

## 2011-11-23 ENCOUNTER — Encounter (HOSPITAL_COMMUNITY): Payer: Medicare Other

## 2011-11-23 ENCOUNTER — Inpatient Hospital Stay (HOSPITAL_COMMUNITY): Admission: RE | Admit: 2011-11-23 | Payer: Medicare Other | Source: Ambulatory Visit

## 2011-11-28 ENCOUNTER — Other Ambulatory Visit (HOSPITAL_COMMUNITY): Payer: Self-pay | Admitting: *Deleted

## 2011-11-30 ENCOUNTER — Encounter (HOSPITAL_COMMUNITY)
Admission: RE | Admit: 2011-11-30 | Discharge: 2011-11-30 | Disposition: A | Discharge status: Home / Self Care | Payer: Medicare Other | Source: Ambulatory Visit | Attending: Nephrology | Admitting: Nephrology

## 2011-11-30 DIAGNOSIS — D638 Anemia in other chronic diseases classified elsewhere: Secondary | ICD-10-CM | POA: Insufficient documentation

## 2011-11-30 DIAGNOSIS — N184 Chronic kidney disease, stage 4 (severe): Secondary | ICD-10-CM | POA: Insufficient documentation

## 2011-11-30 LAB — IRON AND TIBC
Iron: 101 ug/dL (ref 42–135)
UIBC: 151 ug/dL (ref 125–400)

## 2011-11-30 LAB — FERRITIN: Ferritin: 300 ng/mL — ABNORMAL HIGH (ref 10–291)

## 2011-11-30 LAB — POCT HEMOGLOBIN-HEMACUE: Hemoglobin: 13.1 g/dL (ref 12.0–15.0)

## 2011-11-30 MED ORDER — EPOETIN ALFA 10000 UNIT/ML IJ SOLN: 20000.0000 [IU] | INTRAMUSCULAR | Status: DC

## 2011-12-07 ENCOUNTER — Encounter (HOSPITAL_COMMUNITY): Payer: Medicare Other

## 2011-12-08 ENCOUNTER — Encounter (HOSPITAL_COMMUNITY): Payer: Medicare Other

## 2011-12-15 ENCOUNTER — Encounter (HOSPITAL_COMMUNITY): Payer: Medicare Other

## 2011-12-21 ENCOUNTER — Inpatient Hospital Stay (HOSPITAL_COMMUNITY): Admission: RE | Admit: 2011-12-21 | Payer: Medicare Other | Source: Ambulatory Visit

## 2012-01-04 ENCOUNTER — Encounter (HOSPITAL_COMMUNITY): Payer: Medicare Other

## 2012-01-05 ENCOUNTER — Encounter (HOSPITAL_COMMUNITY)
Admission: RE | Admit: 2012-01-05 | Discharge: 2012-01-05 | Disposition: A | Discharge status: Home / Self Care | Payer: Medicare Other | Source: Ambulatory Visit | Attending: Nephrology | Admitting: Nephrology

## 2012-01-05 DIAGNOSIS — D638 Anemia in other chronic diseases classified elsewhere: Secondary | ICD-10-CM | POA: Insufficient documentation

## 2012-01-05 DIAGNOSIS — N184 Chronic kidney disease, stage 4 (severe): Secondary | ICD-10-CM | POA: Insufficient documentation

## 2012-01-05 LAB — POCT HEMOGLOBIN-HEMACUE: Hemoglobin: 12.6 g/dL (ref 12.0–15.0)

## 2012-01-05 MED ORDER — EPOETIN ALFA 10000 UNIT/ML IJ SOLN: 20000.0000 [IU] | INTRAMUSCULAR | Status: DC

## 2012-01-18 ENCOUNTER — Encounter (HOSPITAL_COMMUNITY): Payer: Medicare Other

## 2012-01-25 ENCOUNTER — Encounter (HOSPITAL_COMMUNITY): Payer: Medicare Other

## 2012-01-26 ENCOUNTER — Encounter (HOSPITAL_COMMUNITY): Payer: Medicare Other

## 2012-01-27 ENCOUNTER — Encounter (HOSPITAL_COMMUNITY): Payer: Medicare Other

## 2012-01-31 ENCOUNTER — Encounter (HOSPITAL_COMMUNITY): Payer: Medicare Other

## 2012-02-06 ENCOUNTER — Encounter (HOSPITAL_COMMUNITY): Payer: Medicare Other

## 2012-02-08 ENCOUNTER — Encounter (HOSPITAL_COMMUNITY): Payer: Medicare Other

## 2012-02-20 ENCOUNTER — Encounter (HOSPITAL_COMMUNITY): Payer: Medicare Other

## 2012-02-29 ENCOUNTER — Other Ambulatory Visit (HOSPITAL_COMMUNITY): Payer: Self-pay | Admitting: *Deleted

## 2012-03-01 ENCOUNTER — Encounter (HOSPITAL_COMMUNITY)
Admission: RE | Admit: 2012-03-01 | Discharge: 2012-03-01 | Disposition: A | Discharge status: Home / Self Care | Payer: Medicare Other | Source: Ambulatory Visit | Attending: Nephrology | Admitting: Nephrology

## 2012-03-01 DIAGNOSIS — D638 Anemia in other chronic diseases classified elsewhere: Secondary | ICD-10-CM | POA: Insufficient documentation

## 2012-03-01 DIAGNOSIS — N184 Chronic kidney disease, stage 4 (severe): Secondary | ICD-10-CM | POA: Insufficient documentation

## 2012-03-01 LAB — IRON AND TIBC
Iron: 78 ug/dL (ref 42–135)
TIBC: 218 ug/dL — ABNORMAL LOW (ref 250–470)

## 2012-03-01 LAB — FERRITIN: Ferritin: 412 ng/mL — ABNORMAL HIGH (ref 10–291)

## 2012-03-01 MED ORDER — EPOETIN ALFA 20000 UNIT/ML IJ SOLN
INTRAMUSCULAR | Status: AC
  Administered 2012-03-01: 20000 [IU] via SUBCUTANEOUS
  Filled 2012-03-01: qty 1

## 2012-03-01 MED ORDER — EPOETIN ALFA 10000 UNIT/ML IJ SOLN: 20000.0000 [IU] | INTRAMUSCULAR | Status: DC

## 2012-03-22 ENCOUNTER — Encounter (HOSPITAL_COMMUNITY): Payer: Medicare Other

## 2012-03-25 ENCOUNTER — Emergency Department (HOSPITAL_COMMUNITY): Payer: Medicare Other

## 2012-03-25 ENCOUNTER — Encounter (HOSPITAL_COMMUNITY): Payer: Self-pay | Admitting: *Deleted

## 2012-03-25 ENCOUNTER — Ambulatory Visit: Payer: Medicare Other

## 2012-03-25 ENCOUNTER — Inpatient Hospital Stay (HOSPITAL_COMMUNITY)
Admission: EM | Admit: 2012-03-25 | Discharge: 2012-04-02 | DRG: 470 | Disposition: A | Discharge status: Skilled Nursing Facility | Payer: Medicare Other | Attending: Family Medicine | Admitting: Family Medicine

## 2012-03-25 ENCOUNTER — Ambulatory Visit (INDEPENDENT_AMBULATORY_CARE_PROVIDER_SITE_OTHER): Payer: Medicare Other | Admitting: Emergency Medicine

## 2012-03-25 VITALS — BP 142/68 | HR 60 | Temp 97.9°F | Resp 16 | Ht 60.0 in | Wt 105.0 lb

## 2012-03-25 DIAGNOSIS — W19XXXA Unspecified fall, initial encounter: Secondary | ICD-10-CM | POA: Diagnosis present

## 2012-03-25 DIAGNOSIS — M87059 Idiopathic aseptic necrosis of unspecified femur: Secondary | ICD-10-CM | POA: Diagnosis present

## 2012-03-25 DIAGNOSIS — D631 Anemia in chronic kidney disease: Secondary | ICD-10-CM | POA: Diagnosis present

## 2012-03-25 DIAGNOSIS — Z87891 Personal history of nicotine dependence: Secondary | ICD-10-CM

## 2012-03-25 DIAGNOSIS — N189 Chronic kidney disease, unspecified: Secondary | ICD-10-CM | POA: Diagnosis present

## 2012-03-25 DIAGNOSIS — M25559 Pain in unspecified hip: Secondary | ICD-10-CM

## 2012-03-25 DIAGNOSIS — F0390 Unspecified dementia without behavioral disturbance: Secondary | ICD-10-CM | POA: Diagnosis present

## 2012-03-25 DIAGNOSIS — S72002A Fracture of unspecified part of neck of left femur, initial encounter for closed fracture: Secondary | ICD-10-CM

## 2012-03-25 DIAGNOSIS — W010XXA Fall on same level from slipping, tripping and stumbling without subsequent striking against object, initial encounter: Secondary | ICD-10-CM | POA: Diagnosis present

## 2012-03-25 DIAGNOSIS — S72142A Displaced intertrochanteric fracture of left femur, initial encounter for closed fracture: Secondary | ICD-10-CM | POA: Diagnosis present

## 2012-03-25 DIAGNOSIS — I129 Hypertensive chronic kidney disease with stage 1 through stage 4 chronic kidney disease, or unspecified chronic kidney disease: Secondary | ICD-10-CM | POA: Diagnosis present

## 2012-03-25 DIAGNOSIS — S72009A Fracture of unspecified part of neck of unspecified femur, initial encounter for closed fracture: Secondary | ICD-10-CM

## 2012-03-25 DIAGNOSIS — D62 Acute posthemorrhagic anemia: Secondary | ICD-10-CM | POA: Diagnosis not present

## 2012-03-25 DIAGNOSIS — Z85028 Personal history of other malignant neoplasm of stomach: Secondary | ICD-10-CM

## 2012-03-25 DIAGNOSIS — Y998 Other external cause status: Secondary | ICD-10-CM

## 2012-03-25 DIAGNOSIS — S72143A Displaced intertrochanteric fracture of unspecified femur, initial encounter for closed fracture: Principal | ICD-10-CM | POA: Diagnosis present

## 2012-03-25 DIAGNOSIS — Z7982 Long term (current) use of aspirin: Secondary | ICD-10-CM

## 2012-03-25 HISTORY — DX: Displaced intertrochanteric fracture of right femur, initial encounter for closed fracture: S72.141A

## 2012-03-25 HISTORY — DX: Solitary pulmonary nodule: R91.1

## 2012-03-25 HISTORY — DX: Displaced intertrochanteric fracture of left femur, initial encounter for closed fracture: S72.142A

## 2012-03-25 LAB — COMPREHENSIVE METABOLIC PANEL
AST: 19 U/L (ref 0–37)
Albumin: 3.3 g/dL — ABNORMAL LOW (ref 3.5–5.2)
Calcium: 9.1 mg/dL (ref 8.4–10.5)
Chloride: 99 mEq/L (ref 96–112)
Creatinine, Ser: 1.76 mg/dL — ABNORMAL HIGH (ref 0.50–1.10)
Total Bilirubin: 0.4 mg/dL (ref 0.3–1.2)
Total Protein: 6.4 g/dL (ref 6.0–8.3)

## 2012-03-25 LAB — URINALYSIS, ROUTINE W REFLEX MICROSCOPIC
Nitrite: NEGATIVE
Specific Gravity, Urine: 1.012 (ref 1.005–1.030)
Urobilinogen, UA: 0.2 mg/dL (ref 0.0–1.0)

## 2012-03-25 LAB — CREATININE, SERUM
GFR calc Af Amer: 31 mL/min — ABNORMAL LOW (ref >90)
GFR calc non Af Amer: 27 mL/min — ABNORMAL LOW (ref >90)

## 2012-03-25 LAB — DIFFERENTIAL
Basophils Absolute: 0.1 10*3/uL (ref 0.0–0.1)
Basophils Relative: 1 % (ref 0–1)
Monocytes Absolute: 0.5 10*3/uL (ref 0.1–1.0)
Neutro Abs: 9.4 10*3/uL — ABNORMAL HIGH (ref 1.7–7.7)

## 2012-03-25 LAB — CBC
HCT: 35.2 % — ABNORMAL LOW (ref 36.0–46.0)
HCT: 34.6 % — ABNORMAL LOW (ref 36.0–46.0)
Hemoglobin: 11.3 g/dL — ABNORMAL LOW (ref 12.0–15.0)
MCHC: 32.4 g/dL (ref 30.0–36.0)
MCHC: 32.7 g/dL (ref 30.0–36.0)
RDW: 13.9 % (ref 11.5–15.5)
WBC: 11.2 10*3/uL — ABNORMAL HIGH (ref 4.0–10.5)

## 2012-03-25 LAB — URINE MICROSCOPIC-ADD ON

## 2012-03-25 LAB — TYPE AND SCREEN: Antibody Screen: NEGATIVE

## 2012-03-25 MED ORDER — VITAMIN B-12 1000 MCG PO TABS
1000.0000 ug | ORAL_TABLET | ORAL | Status: DC
  Administered 2012-03-26 – 2012-04-02 (×4): 1000 ug via ORAL
  Filled 2012-03-25 (×4): qty 1

## 2012-03-25 MED ORDER — ACETAMINOPHEN 325 MG PO TABS
650.0000 mg | ORAL_TABLET | ORAL | Status: DC
  Administered 2012-03-25 – 2012-03-27 (×9): 650 mg via ORAL
  Filled 2012-03-25 (×9): qty 2

## 2012-03-25 MED ORDER — ONDANSETRON HCL 4 MG/2ML IJ SOLN: 4.0000 mg | Freq: Four times a day (QID) | INTRAMUSCULAR | Status: DC | PRN

## 2012-03-25 MED ORDER — BIOTENE DRY MOUTH MT LIQD
15.0000 mL | Freq: Two times a day (BID) | OROMUCOSAL | Status: DC
  Administered 2012-03-25 – 2012-04-02 (×14): 15 mL via OROMUCOSAL

## 2012-03-25 MED ORDER — DONEPEZIL HCL 10 MG PO TABS
10.0000 mg | ORAL_TABLET | Freq: Every day | ORAL | Status: DC
  Administered 2012-03-26 – 2012-04-02 (×8): 10 mg via ORAL
  Filled 2012-03-25 (×10): qty 1

## 2012-03-25 MED ORDER — ONDANSETRON HCL 4 MG/2ML IJ SOLN
4.0000 mg | Freq: Once | INTRAMUSCULAR | Status: AC
  Administered 2012-03-25: 4 mg via INTRAVENOUS
  Filled 2012-03-25: qty 2

## 2012-03-25 MED ORDER — CALCITRIOL 1 MCG/ML PO SOLN
0.2500 ug | Freq: Every day | ORAL | Status: DC
  Administered 2012-03-25: 0.25 ug via ORAL
  Filled 2012-03-25 (×2): qty 0.25

## 2012-03-25 MED ORDER — SODIUM BICARBONATE 650 MG PO TABS
650.0000 mg | ORAL_TABLET | Freq: Four times a day (QID) | ORAL | Status: DC
  Administered 2012-03-25 – 2012-04-02 (×31): 650 mg via ORAL
  Filled 2012-03-25 (×34): qty 1

## 2012-03-25 MED ORDER — ONDANSETRON HCL 4 MG PO TABS: 4.0000 mg | ORAL_TABLET | Freq: Four times a day (QID) | ORAL | Status: DC | PRN

## 2012-03-25 MED ORDER — SODIUM CHLORIDE 0.9 % IV SOLN
1000.0000 mL | INTRAVENOUS | Status: DC
  Administered 2012-03-25: 1000 mL via INTRAVENOUS

## 2012-03-25 MED ORDER — SODIUM CHLORIDE 0.9 % IV SOLN
1000.0000 mL | INTRAVENOUS | Status: DC
  Administered 2012-03-25 – 2012-03-26 (×3): 1000 mL via INTRAVENOUS

## 2012-03-25 MED ORDER — ENOXAPARIN SODIUM 30 MG/0.3ML ~~LOC~~ SOLN
30.0000 mg | SUBCUTANEOUS | Status: DC
  Administered 2012-03-25: 30 mg via SUBCUTANEOUS
  Filled 2012-03-25 (×2): qty 0.3

## 2012-03-25 MED ORDER — FENTANYL CITRATE 0.05 MG/ML IJ SOLN
50.0000 ug | Freq: Once | INTRAMUSCULAR | Status: AC
  Administered 2012-03-25: 50 ug via INTRAVENOUS
  Filled 2012-03-25: qty 2

## 2012-03-25 MED ORDER — MEMANTINE HCL 10 MG PO TABS
10.0000 mg | ORAL_TABLET | Freq: Two times a day (BID) | ORAL | Status: DC
  Administered 2012-03-25 – 2012-04-02 (×16): 10 mg via ORAL
  Filled 2012-03-25 (×17): qty 1

## 2012-03-25 MED ORDER — POLYETHYLENE GLYCOL 3350 17 G PO PACK
17.0000 g | PACK | Freq: Every day | ORAL | Status: DC | PRN
  Filled 2012-03-25: qty 1

## 2012-03-25 NOTE — ED Provider Notes (Signed)
History     CSN: [id number]  Arrival date & time 03/25/12  1441   First MD Initiated Contact with Patient 03/25/12 1444      Chief Complaint  Patient presents with  . Hip Pain    (Consider location/radiation/quality/duration/timing/severity/associated sxs/prior treatment) Patient is a 78 y.o. female presenting with hip pain. The history is provided by the patient and medical records.  Hip Pain   patient was transferred from PCP after a fall. Reportedly with when she got into the shower. She has left hip pain. X-ray there showed a fracture. The patient claims no other injury. She has baseline dementia.  Past Medical History  Diagnosis Date  . Dementia   . Hypertension   . Ulcer     Hx of  . Stomach cancer   . Snoring     Past Surgical History  Procedure Date  . Stomach surgery     3/4 of stomach removed due to stomach cancer  . Hip surgery 2009    right    Family History  Problem Relation Age of Onset  . Cancer Father   . Liver cancer Son   . Allergies      History  Substance Use Topics  . Smoking status: Former Smoker -- 0.3 packs/day for 2 years    Types: Cigarettes    Quit date: 12/12/1973  . Smokeless tobacco: Never Used  . Alcohol Use: Yes     rare    OB History    Grav Para Term Preterm Abortions TAB SAB Ect Mult Living                  Review of Systems  Unable to perform ROS: Dementia    Allergies  Review of patient's allergies indicates no known allergies.  Home Medications   No current outpatient prescriptions on file.  BP 145/58  Pulse 65  Temp(Src) 96.5 F (35.8 C) (Oral)  Resp 18  Ht 5' (1.524 m)  Wt 105 lb (47.628 kg)  BMI 20.51 kg/m2  SpO2 98%  Physical Exam  Vitals reviewed. Constitutional: She appears well-developed.  HENT:  Head: Normocephalic.  Eyes: Pupils are equal, round, and reactive to light.  Neck: Normal range of motion.  Cardiovascular: Normal rate and regular rhythm.   Pulmonary/Chest: Effort normal  and breath sounds normal.  Abdominal: There is no tenderness.  Musculoskeletal:       Moderate tenderness to left hip. Pain with any movement of the hip. Sensation intact distally.  Neurological: She is alert.       Patient has baseline dementia  Skin: There is erythema.    ED Course  Procedures (including critical care time)  Labs Reviewed  CBC - Abnormal; Notable for the following:    WBC 10.6 (*)    RBC 3.58 (*)    Hemoglobin 11.4 (*)    HCT 35.2 (*)    All other components within normal limits  DIFFERENTIAL - Abnormal; Notable for the following:    Neutrophils Relative 89 (*)    Neutro Abs 9.4 (*)    Lymphocytes Relative 6 (*)    Lymphs Abs 0.6 (*)    All other components within normal limits  COMPREHENSIVE METABOLIC PANEL - Abnormal; Notable for the following:    Glucose, Bld 127 (*)    BUN 48 (*)    Creatinine, Ser 1.76 (*)    Albumin 3.3 (*)    GFR calc non Af Amer 26 (*)    GFR calc Af  Amer 30 (*)    All other components within normal limits  URINALYSIS, ROUTINE W REFLEX MICROSCOPIC - Abnormal; Notable for the following:    APPearance CLOUDY (*)    Hgb urine dipstick SMALL (*)    Protein, ur 30 (*)    Leukocytes, UA LARGE (*)    All other components within normal limits  URINE MICROSCOPIC-ADD ON - Abnormal; Notable for the following:    Bacteria, UA MANY (*)    All other components within normal limits  CBC - Abnormal; Notable for the following:    WBC 11.2 (*)    RBC 3.57 (*)    Hemoglobin 11.3 (*)    HCT 34.6 (*)    All other components within normal limits  CREATININE, SERUM - Abnormal; Notable for the following:    Creatinine, Ser 1.70 (*)    GFR calc non Af Amer 27 (*)    GFR calc Af Amer 31 (*)    All other components within normal limits  TYPE AND SCREEN  ABO/RH  URINE CULTURE  CBC  BASIC METABOLIC PANEL   Dg Mandible 4 Views  03/25/2012  *RADIOLOGY REPORT*  Clinical Data: Status post fall.  Chin bruising and pain.  MANDIBLE - 4+ VIEW   Comparison: None.  Findings: No evidence of acute mandible fracture or TMJ dislocation.  IMPRESSION: No acute osseous findings.  Original Report Authenticated By: Gerrianne Scale, M.D.   Dg Chest 1 View  03/25/2012  *RADIOLOGY REPORT*  Clinical Data: Left hip fracture.  Hypertension.  CHEST - 1 VIEW  Comparison: Radiographs 06/28/2008.  CT 11/15/2011.  Findings: Previously demonstrated left lower lobe pulmonary nodule is not well seen on this portable examination and may be smaller. The right lung appears clear.  There is no pleural effusion or pneumothorax.  Heart size and mediastinal contours are stable. Glenohumeral degenerative changes are present bilaterally.  IMPRESSION:  1.  No acute cardiopulmonary process seen. 2.  Known left lower lobe pulmonary nodule not well visualized.  Original Report Authenticated By: Gerrianne Scale, M.D.   Dg Cervical Spine Complete  03/25/2012  *RADIOLOGY REPORT*  Clinical Data: Fall, neck pain  CERVICAL SPINE - COMPLETE 4+ VIEW  Comparison: PET CT scan 06/01/2011  Findings: No lateral projection is provided.  There is no gross evidence of vertebral body fracture.  Open mouth odontoid view appears normal.  IMPRESSION: No gross evidence of fracture.  Exam is limited in that there is no lateral projection.  Best images were obtained due to patient immobility.  If concern for cervical spine fracture recommend CT of the neck.  Original Report Authenticated By: Genevive Bi, M.D.   Dg Hip Complete Left  03/25/2012  *RADIOLOGY REPORT*  Clinical Data: Pain status post fall.  LEFT HIP - COMPLETE 2+ VIEW  Comparison: PET CT 06/03/2011.  Findings: There is a nondisplaced intertrochanteric left femur fracture.  There is severe underlying left hip osteoarthritis with flattening of the femoral head and osteophyte formation. This may be secondary to remote femoral head osteonecrosis.  Patient is status post right femoral fixation and L5 spinal augmentation. There is deformity of  the right pubic bone consistent with an old fracture.  No acute pelvic fractures are identified.  IMPRESSION: Nondisplaced intertrochanteric fracture of the left femur.  Severe left hip osteoarthritis.  Clinically significant discrepancy from primary report, if provided: None  Original Report Authenticated By: Gerrianne Scale, M.D.   Ct Hip Left Wo Contrast  03/25/2012  *RADIOLOGY REPORT*  Clinical  Data: Left hip pain status post fall.  Evaluate fracture.  CT OF THE LEFT HIP WITHOUT CONTRAST  Technique:  Multidetector CT imaging was performed according to the standard protocol. Multiplanar CT image reconstructions were also generated.  Comparison: Radiographs same date.  Findings: There is a nondisplaced intertrochanteric left femur fracture.  This extends across the base of the femoral neck medially.  The patient has severe underlying left hip osteoarthritis with flattening of the femoral head and extensive osteophyte formation. There are multiple intra-articular loose bodies.  There is a moderate sized hip joint effusion.  Fluid extends into the iliopsoas bursa.  No acute fractures are seen within the visualized left hemi pelvis. An old fracture of the right pubic ramus is partially imaged. There are degenerative changes at the left sacroiliac joint. Vaginal support device noted.  IMPRESSION:  1.  Nondisplaced intertrochanteric left femur fracture extending into the basicervical region medially. 2.  Underlying severe left hip osteoarthritis with multiple loose bodies.  Original Report Authenticated By: Gerrianne Scale, M.D.     1. Hip fracture, left       MDM  Transfer from PCP with a left hip fracture. Likely ASA 3. Patient will be admitted to family practice [facility] with an orthopedic consult.        Juliet Rude. Rubin Payor, MD 03/25/12 [phone]

## 2012-03-25 NOTE — ED Notes (Signed)
Pt arrived by ems from [facility], pt had fall at 1215 when getting into her shower, +left hip fracture.

## 2012-03-25 NOTE — Progress Notes (Signed)
03/25/12 1526  Discharge Planning  Type of Residence Private residence  Living Arrangements Alone  Home Care Services No  Support Systems Children;Other relatives  Do you have any problems obtaining your medications? No  Once you are discharged, how will you get to your follow-up appointment? Self;Family  Expected Discharge Date 03/28/12  Social Work Consult Needed Yes (Comment)     Pt with + Hip FX probable SNF Placement   Dionne Milo MSW [facility] Emergency Dept. Weekend/Social Worker [phone]

## 2012-03-25 NOTE — Progress Notes (Signed)
03/25/12 1527  OTHER  CSW Follow Up Status Follow-up required     Dionne Milo MSW [facility] Emergency Dept. Weekend/Social Worker [phone]

## 2012-03-25 NOTE — Consult Note (Signed)
ORTHOPAEDIC CONSULTATION  REQUESTING PHYSICIAN: Barbaraann Barthel, MD  Chief Complaint: Left hip pain  HPI: [name] is a 78 y.o. female who complains of  left hip pain after a fall today. She was seen at urgent care and then admitted to the hospital. She has a past history of a right hip fracture. She also has dementia. According to the son, her dementia has actually been improving over the course of the past couple of years, which is remarkable, and it is thought that her poor cognitive capacity a couple of years ago was secondary to narcotic utilization, which has decreased.  She currently complains of left-sided hip pain, and she is unable to walk. Pain is located around the left groin and hip, and is rated as severe. She has chronic left-sided hip pain that is long-standing, which has impaired her capacity to angulate. She normally does not use any type of assistive devices, although she uses walls and chairs to support herself as she walks around the house. She lives at home with her son. She has a past history of a right hip fracture which I treated about 3 years ago, and she did well from, although she did not enjoy her stay at skilled nursing.  Of note, her mother died of complications of a hip replacement at age 52.  Past Medical History  Diagnosis Date  . Dementia   . Hypertension   . Ulcer     Hx of  . Stomach cancer   . Snoring    Past Surgical History  Procedure Date  . Stomach surgery     3/4 of stomach removed due to stomach cancer  . Hip surgery 2009    right   History   Social History  . Marital Status: Married    Spouse Name: N/A    Number of Children: N/A  . Years of Education: N/A   Social History Main Topics  . Smoking status: Former Smoker -- 0.3 packs/day for 2 years    Types: Cigarettes    Quit date: 12/12/1973  . Smokeless tobacco: Never Used  . Alcohol Use: Yes     rare  . Drug Use: None  . Sexually Active: None   Other Topics Concern  .  None   Social History Narrative  . None   Family History  Problem Relation Age of Onset  . Cancer Father   . Liver cancer Son   . Allergies     No Known Allergies Prior to Admission medications   Medication Sig Start Date End Date Taking? Authorizing Provider  aspirin 81 MG tablet Take 81 mg by mouth daily.     Yes Historical Provider, MD  calcitRIOL (ROCALTROL) 1 MCG/ML solution Take 0.25 mcg by mouth daily.   Yes Historical Provider, MD  donepezil (ARICEPT) 10 MG tablet Take 10 mg by mouth daily after breakfast.    Yes Historical Provider, MD  furosemide (LASIX) 40 MG tablet Take 40 mg by mouth daily.   Yes Historical Provider, MD  memantine (NAMENDA) 10 MG tablet Take 10 mg by mouth 2 (two) times daily.     Yes Historical Provider, MD  Multiple Vitamins-Minerals (EQL GUMMY ADULT PO) Take 1 each by mouth daily.     Yes Historical Provider, MD  sodium bicarbonate 650 MG tablet Take 650 mg by mouth 4 (four) times daily.   Yes Historical Provider, MD  vitamin B-12 (CYANOCOBALAMIN) 1000 MCG tablet Take 1,000 mcg by mouth every Monday,  Wednesday, and Friday. Three times a week   Yes Historical Provider, MD   Dg Mandible 4 Views  03/25/2012  *RADIOLOGY REPORT*  Clinical Data: Status post fall.  Chin bruising and pain.  MANDIBLE - 4+ VIEW  Comparison: None.  Findings: No evidence of acute mandible fracture or TMJ dislocation.  IMPRESSION: No acute osseous findings.  Original Report Authenticated By: Gerrianne Scale, M.D.   Dg Chest 1 View  03/25/2012  *RADIOLOGY REPORT*  Clinical Data: Left hip fracture.  Hypertension.  CHEST - 1 VIEW  Comparison: Radiographs 06/28/2008.  CT 11/15/2011.  Findings: Previously demonstrated left lower lobe pulmonary nodule is not well seen on this portable examination and may be smaller. The right lung appears clear.  There is no pleural effusion or pneumothorax.  Heart size and mediastinal contours are stable. Glenohumeral degenerative changes are present  bilaterally.  IMPRESSION:  1.  No acute cardiopulmonary process seen. 2.  Known left lower lobe pulmonary nodule not well visualized.  Original Report Authenticated By: Gerrianne Scale, M.D.   Dg Cervical Spine Complete  03/25/2012  *RADIOLOGY REPORT*  Clinical Data: Fall, neck pain  CERVICAL SPINE - COMPLETE 4+ VIEW  Comparison: PET CT scan 06/01/2011  Findings: No lateral projection is provided.  There is no gross evidence of vertebral body fracture.  Open mouth odontoid view appears normal.  IMPRESSION: No gross evidence of fracture.  Exam is limited in that there is no lateral projection.  Best images were obtained due to patient immobility.  If concern for cervical spine fracture recommend CT of the neck.  Original Report Authenticated By: Genevive Bi, M.D.   Dg Hip Complete Left  03/25/2012  *RADIOLOGY REPORT*  Clinical Data: Pain status post fall.  LEFT HIP - COMPLETE 2+ VIEW  Comparison: PET CT 06/03/2011.  Findings: There is a nondisplaced intertrochanteric left femur fracture.  There is severe underlying left hip osteoarthritis with flattening of the femoral head and osteophyte formation. This may be secondary to remote femoral head osteonecrosis.  Patient is status post right femoral fixation and L5 spinal augmentation. There is deformity of the right pubic bone consistent with an old fracture.  No acute pelvic fractures are identified.  IMPRESSION: Nondisplaced intertrochanteric fracture of the left femur.  Severe left hip osteoarthritis.  Clinically significant discrepancy from primary report, if provided: None  Original Report Authenticated By: Gerrianne Scale, M.D.   Ct Hip Left Wo Contrast  03/25/2012  *RADIOLOGY REPORT*  Clinical Data: Left hip pain status post fall.  Evaluate fracture.  CT OF THE LEFT HIP WITHOUT CONTRAST  Technique:  Multidetector CT imaging was performed according to the standard protocol. Multiplanar CT image reconstructions were also generated.  Comparison:  Radiographs same date.  Findings: There is a nondisplaced intertrochanteric left femur fracture.  This extends across the base of the femoral neck medially.  The patient has severe underlying left hip osteoarthritis with flattening of the femoral head and extensive osteophyte formation. There are multiple intra-articular loose bodies.  There is a moderate sized hip joint effusion.  Fluid extends into the iliopsoas bursa.  No acute fractures are seen within the visualized left hemi pelvis. An old fracture of the right pubic ramus is partially imaged. There are degenerative changes at the left sacroiliac joint. Vaginal support device noted.  IMPRESSION:  1.  Nondisplaced intertrochanteric left femur fracture extending into the basicervical region medially. 2.  Underlying severe left hip osteoarthritis with multiple loose bodies.  Original Report Authenticated By: Chrissie Noa  B. Purcell Mouton, M.D.    Positive ROS: All other systems have been reviewed and were otherwise negative with the exception of those mentioned in the HPI and as above.  Physical Exam: General: Alert, no acute distress Cardiovascular: No pedal edema Respiratory: No cyanosis, no use of accessory musculature GI: No organomegaly, abdomen is soft and non-tender Skin: No lesions in the area of chief complaint Neurologic: Sensation intact distally Psychiatric: Patient is appropriate with me throughout the interaction, although her insight is somewhat poor. Her son, Nida Boatman, is the shared with her power of attorney, and is here discussing her care. Lymphatic: No axillary or cervical lymphadenopathy  MUSCULOSKELETAL: Left hip has positive pain with log roll movement. Right hip has well-healed surgical wounds. EHL and FHL are firing bilaterally.  Assessment: Left intertrochanteric hip fracture, with advanced severe avascular necrosis, with coexisting risk factors including previous right hip fracture, and dementia.  Plan: This is an acute severe  injury and has substantial risks both for morbidity and mortality standpoint either with or without surgery. I had an extensive discussion with Nida Boatman, her son, telephone [phone], regarding her options. One option would be nonoperative care, versus surgical intervention with internal fixation, which would not treat her previous arthritis, but would treat the fracture, versus total hip replacement. I've counseled him that nonoperative care may not provide the same potential for quality of life as operative care, and would require substantial narcotics, which had substantial adverse effects on her the last time she broke her hip.  The operative options would include internal fixation, which I think would be lower risk than total hip replacement, however not address all of her hip pathology. If we can get a successful total hip replacement then I think that this would improve her long-term function, and overall quality of life. Having said that, I communicated to them that total hip replacement is higher risk than any of the other options.  There is particular anxiety regarding his decision given her family history of death after a hip replacement. I have detail all of the options with them. We discussed the risks benefits and alternatives were discussed with the patient including but not limited to the risks of nonoperative treatment, versus surgical intervention including infection, bleeding, nerve injury, periprosthetic fracture, the need for revision surgery, dislocation, leg length discrepancy, blood clots, cardiopulmonary complications, morbidity, mortality, among others, and they are going to have a family conference and get back to me tomorrow.  Estimated the side to do surgery, then I will probably anticipate performing surgery on Tuesday, assuming that she is optimized medically.     Eulas Post, MD 03/25/2012 6:33 PM

## 2012-03-25 NOTE — Progress Notes (Signed)
  Subjective:    Patient ID: [name], female    DOB: May 29, 1926, 78 y.o.   MRN: [id number]  HPI bowel sounds disease was trying to get in the shower and fell striking her left hip. She is now unable to walk. She has exquisite pain with any movement of the left leg.    Review of Systems     Objective:   Physical Exam patient will not allow any movement of the left leg. She will not allow movement of the knee or the lef    [facility] reading (PRIMARY) by  Dr. Cleta Alberts is avascular necrosis involving the hip joint as well as an inter trochanteric fracture.   Assessment & Plan:  Will get her to x-ray and get films of the left hip femur and knee

## 2012-03-25 NOTE — H&P (Signed)
[facility] Attending Admission Note  Patient seen and examined by me, discussed with admitting resident team and I agree with plan. Briefly, an 85yoF, community-dwelling with son Nida Boatman (at bedside), who reports a slip and fall in her bathroom and subsequent left hip fracture.  She struck her chin in the fall, but otherwise no associated trauma.   On exam she has full range of motion of neck in all planes, and no focal vertebral tenderness to palpation.   Limited cervical films do not reveal fracture but are limited by patient inability to position for lateral views.   I agree with plan to provide VTE prophylaxis and analgesia, avoiding NSAIDS (chronic kidney disease) and opioids at family request (they report "mini strokes" with narcotics, though I wonder if patient might have had delirium/psychosis associated with narcotics).  Orthopedics for fracture management.  Paula Compton, MD

## 2012-03-25 NOTE — H&P (Signed)
[name] is an 78 y.o. female.   History and Physical Note Family Medicine Teaching Service Service Pager: [phone]  Chief Complaint: Fall  HPI: Patient is an 78 yo F with PMH of dementia and kidney disease who presents from [facility] with left hip fracture. Son is at bedside to assist with history. States patient was in the bathroom this morning prior to getting in the shower when she tripped on the rug. She denies weakness, lightheadedness or dizziness. Denies LOC or loss of urine. Denies CP, palpitations. States she has excellent vision. She has had a few stumbles in the recent history, but no falls. She does have history of "mini seizures" and led to dementia per son. She is followed by neurology. She had a fall 4 years ago and fractured her right hip. She does not use a walker or cane at home, but uses furniture at home to hold on to. She states she is having some pain in her hip at this time. She also hit her chin when she fell and reports some pain of the face.  ED Course: Patient had films at [facility] which showed left intertrochanteric fracture. Ortho was consulted from the ED. Had hip CT scan. Given other medical conditions, FMTS was called for admission.  Past Medical History  Diagnosis Date  . Dementia   . Hypertension   . Ulcer     Hx of  . Stomach cancer   . Snoring     Past Surgical History  Procedure Date  . Stomach surgery     3/4 of stomach removed due to stomach cancer  . Hip surgery 2009    right    Family History  Problem Relation Age of Onset  . Cancer Father   . Liver cancer Son   . Allergies     Social History:  reports that she quit smoking about 38 years ago. Her smoking use included Cigarettes. She has a .6 pack-year smoking history. She has never used smokeless tobacco. She reports that she drinks alcohol. Her drug history not on file. Lives with Son. Allergies: No Known Allergies  Medications Prior to Admission  Medication Dose Route  Frequency Provider Last Rate Last Dose  . 0.9 %  sodium chloride infusion  1,000 mL Intravenous Continuous Juliet Rude. Pickering, MD      . fentaNYL (SUBLIMAZE) injection 50 mcg  50 mcg Intravenous Once [employer]. Pickering, MD      . ondansetron [facility]) injection 4 mg  4 mg Intravenous Once Harrold Donath R. Rubin Payor, MD       Medications Prior to Admission  Medication Sig Dispense Refill  . aspirin 81 MG tablet Take 81 mg by mouth daily.        Marland Kitchen donepezil (ARICEPT) 10 MG tablet Take 10 mg by mouth daily after breakfast.       . furosemide (LASIX) 40 MG tablet Take 40 mg by mouth daily.      . memantine (NAMENDA) 10 MG tablet Take 10 mg by mouth 2 (two) times daily.        . Multiple Vitamins-Minerals (EQL GUMMY ADULT PO) Take 1 each by mouth daily.        . vitamin B-12 (CYANOCOBALAMIN) 1000 MCG tablet Take 1,000 mcg by mouth every Monday, Wednesday, and Friday. Three times a week      Calcitriol Sodium bicarb 650mg  BID (2 tabs in morning, 2 at night)  Results for orders placed during the hospital encounter of 03/25/12 (  from the past 48 hour(s))  CBC     Status: Abnormal   Collection Time   03/25/12  2:55 PM      Component Value Range Comment   WBC 10.6 (*) 4.0 - 10.5 (K/uL)    RBC 3.58 (*) 3.87 - 5.11 (MIL/uL)    Hemoglobin 11.4 (*) 12.0 - 15.0 (g/dL)    HCT 16.1 (*) 09.6 - 46.0 (%)    MCV 98.3  78.0 - 100.0 (fL)    MCH 31.8  26.0 - 34.0 (pg)    MCHC 32.4  30.0 - 36.0 (g/dL)    RDW 04.5  40.9 - 81.1 (%)    Platelets 223  150 - 400 (K/uL)   DIFFERENTIAL     Status: Abnormal   Collection Time   03/25/12  2:55 PM      Component Value Range Comment   Neutrophils Relative 89 (*) 43 - 77 (%)    Neutro Abs 9.4 (*) 1.7 - 7.7 (K/uL)    Lymphocytes Relative 6 (*) 12 - 46 (%)    Lymphs Abs 0.6 (*) 0.7 - 4.0 (K/uL)    Monocytes Relative 5  3 - 12 (%)    Monocytes Absolute 0.5  0.1 - 1.0 (K/uL)    Eosinophils Relative 1  0 - 5 (%)    Eosinophils Absolute 0.1  0.0 - 0.7 (K/uL)    Basophils  Relative 1  0 - 1 (%)    Basophils Absolute 0.1  0.0 - 0.1 (K/uL)   COMPREHENSIVE METABOLIC PANEL     Status: Abnormal   Collection Time   03/25/12  2:55 PM      Component Value Range Comment   Sodium 137  135 - 145 (mEq/L)    Potassium 3.5  3.5 - 5.1 (mEq/L)    Chloride 99  96 - 112 (mEq/L)    CO2 29  19 - 32 (mEq/L)    Glucose, Bld 127 (*) 70 - 99 (mg/dL)    BUN 48 (*) 6 - 23 (mg/dL)    Creatinine, Ser 9.14 (*) 0.50 - 1.10 (mg/dL)    Calcium 9.1  8.4 - 10.5 (mg/dL)    Total Protein 6.4  6.0 - 8.3 (g/dL)    Albumin 3.3 (*) 3.5 - 5.2 (g/dL)    AST 19  0 - 37 (U/L)    ALT 13  0 - 35 (U/L)    Alkaline Phosphatase 102  39 - 117 (U/L)    Total Bilirubin 0.4  0.3 - 1.2 (mg/dL)    GFR calc non Af Amer 26 (*) >90 (mL/min)    GFR calc Af Amer 30 (*) >90 (mL/min)   TYPE AND SCREEN     Status: Normal   Collection Time   03/25/12  3:00 PM      Component Value Range Comment   ABO/RH(D) B POS      Antibody Screen NEG      Sample Expiration 03/28/2012      Dg Hip Complete Left  03/25/2012  *RADIOLOGY REPORT*  Clinical Data: Pain status post fall.  LEFT HIP - COMPLETE 2+ VIEW  Comparison: PET CT 06/03/2011.  Findings: There is a nondisplaced intertrochanteric left femur fracture.  There is severe underlying left hip osteoarthritis with flattening of the femoral head and osteophyte formation. This may be secondary to remote femoral head osteonecrosis.  Patient is status post right femoral fixation and L5 spinal augmentation. There is deformity of the right pubic bone consistent with an old  fracture.  No acute pelvic fractures are identified.  IMPRESSION: Nondisplaced intertrochanteric fracture of the left femur.  Severe left hip osteoarthritis.  Clinically significant discrepancy from primary report, if provided: None  Original Report Authenticated By: Gerrianne Scale, M.D.   Ct Hip Left Wo Contrast  03/25/2012  *RADIOLOGY REPORT*  Clinical Data: Left hip pain status post fall.  Evaluate fracture.   CT OF THE LEFT HIP WITHOUT CONTRAST  Technique:  Multidetector CT imaging was performed according to the standard protocol. Multiplanar CT image reconstructions were also generated.  Comparison: Radiographs same date.  Findings: There is a nondisplaced intertrochanteric left femur fracture.  This extends across the base of the femoral neck medially.  The patient has severe underlying left hip osteoarthritis with flattening of the femoral head and extensive osteophyte formation. There are multiple intra-articular loose bodies.  There is a moderate sized hip joint effusion.  Fluid extends into the iliopsoas bursa.  No acute fractures are seen within the visualized left hemi pelvis. An old fracture of the right pubic ramus is partially imaged. There are degenerative changes at the left sacroiliac joint. Vaginal support device noted.  IMPRESSION:  1.  Nondisplaced intertrochanteric left femur fracture extending into the basicervical region medially. 2.  Underlying severe left hip osteoarthritis with multiple loose bodies.  Original Report Authenticated By: Gerrianne Scale, M.D.    Review of Systems  Constitutional: Negative for fever and chills.  Eyes: Negative for blurred vision.  Respiratory: Negative for shortness of breath.   Cardiovascular: Negative for chest pain and palpitations.  Gastrointestinal: Negative for abdominal pain.  Genitourinary: Negative for dysuria.  Musculoskeletal: Positive for joint pain and falls.  Skin: Negative for rash.  Neurological: Negative for dizziness, seizures and headaches.    Blood pressure 147/57, pulse 62, temperature 96.5 F (35.8 C), temperature source Oral, resp. rate 18, SpO2 94.00%. Physical Exam  Constitutional: She appears well-developed and well-nourished. She appears distressed (appears uncomfortable. Lying very still in bed.).  HENT:  Head: Normocephalic.       Visible bruising of mandible. Tender to palpation  Eyes:       Pinpoint pupils    Cardiovascular: Normal rate and regular rhythm.   Respiratory: Effort normal and breath sounds normal.  GI: Soft.  Musculoskeletal:       Patient lying still in bed. Does not move hip or knee.  Lymphadenopathy:    She has no cervical adenopathy.  Neurological: She is alert.       Answers questions appropriately.  Skin: Skin is warm and dry.  Psychiatric: She has a normal mood and affect.     Assessment/Plan 78 yo F with dementia presenting with left hip fracture s/p fall  # Left hip fracture- Seen on Xray and CT scan. Ortho has been consulted - Admit to telemetry (due to fall), attending Dr. Mauricio Po - Pain control with scheduled Tylenol q6 hours. On calcitriol at home. Will add stronger pain medication, if requested - Bed rest - Lovenox (adjusted for creat clearance)  #S/P fall- Patient denies any symptoms surrounding fall. Denies LOC. - Monitor on telemetry. - No syncope workup at this time - Patient had impact to chin as well. Will get X-ray of mandible as well as c-spine to evaluate for fractures.  # Dementia- Followed by neurology, on medications at home. Stable at this time, and has been improving in recent months per son's report - Continue Namenda and Aricept  # CKD- Followed as an outpatient by nephrology. Creat  1.76 at admission - Give gentle fluids - Hold home Lasix for now - Avoid nephrotoxic agents - Recheck BMET in the AM  # FEN/GI- Regular diet. NS @ 75 cc/hr. Patient may be NPO after midnight according to surgery recommendations.   # PPx- Lovenox (adjusted for creat clearance)  # Dispo- Pending ortho evaluation and overall clinical improvement  Lives with son, Mccayla Shimada [phone]. Code status: Full  Draylon Mercadel 03/25/2012, 5:02 PM    Family Medicine Teaching Service Hospital Admission History and Physical  Patient name: [name] Medical record number: [id number] Date of birth: 08/13/1926 Age: 78 y.o. Gender: female  Primary Care Provider:  Elvina Sidle, MD, MD  Chief Complaint: hip pain  History of Present Illness: [name] is a 78 y.o. year old female presenting with left hip pain after fall today. She reports that she thinks she tripped on a rug in the bathroom and fell.  She thinks she also hit her chin on the sink.   She states that she was not dizzy before her fall, she did not have CP or palpitations and she did not lose consciousness.  She did not lose control of bowel or bladder. She denies HA now.  She reports 3 falls in the last several years, but none were serious.  She typically uses a wheelchair for long trips.  Her son lives with her and helps her with ADLs, but she typically can bathe herself.    Review Of Systems: Per HPI with the following additions: none Otherwise 12 point review of systems was performed and was unremarkable.    Physical Exam: BP 147/57  Pulse 62  Temp(Src) 96.5 F (35.8 C) (Oral)  Resp 18  SpO2 94%            General: alert, cooperative and no distress HEENT: small pupils Heart: S1, S2 normal, no murmur, rub or gallop, regular rate and rhythm Lungs: clear to auscultation, no wheezes or rales and unlabored breathing Abdomen: abdomen is soft without significant tenderness, masses, organomegaly or guarding Extremities: no edema in LE.  very tender to any movt in left hip.   Musculoskeletal: bruise on chin with tenderness.  No loose teeth.   Skin:no rashes Neurology: normal without focal findings and sensation grossly normal    Assessment and Plan: [name] is a 78 y.o. year old female presenting with hip pain 1. Hip pain- initial read of xray is hip fracture.  Ortho requested a CT for further eval.  Awaiting ortho recs.  Will manage pain with scheduled tylenol for now unless needs further narcotics. 2.   Chin pain- will obtain mandible xray to eval for fracture 3.   CKD- avoid nephrotoxic agents.  Monitor Cr.  4.   Dementia- continue namenda and aricept.  May have  increased confusion in the hospital and will monitor for delerium.   5.   HTN-no home meds.  Continue to monitor. 2. FEN/GI: NS at 75 ml/hr.  Regular diet 3. Prophylaxis: lovenox adjusted for CrCl 4. Disposition: pending ortho recs   Signed: Rodman Pickle, MD Family Medicine Resident PGY-3 03/25/2012 5:02 PM  [phone]

## 2012-03-25 NOTE — ED Notes (Signed)
Attempted to call report to Lurena Joiner ,RN on [phone] x 2.  The second time report was attempted unable to give to Charge Nurse because she was off floor.

## 2012-03-26 ENCOUNTER — Encounter (HOSPITAL_COMMUNITY): Payer: Self-pay | Admitting: Orthopedic Surgery

## 2012-03-26 LAB — CBC
HCT: 31.2 % — ABNORMAL LOW (ref 36.0–46.0)
RBC: 3.18 MIL/uL — ABNORMAL LOW (ref 3.87–5.11)
RDW: 14 % (ref 11.5–15.5)
WBC: 6.4 10*3/uL (ref 4.0–10.5)

## 2012-03-26 LAB — BASIC METABOLIC PANEL
BUN: 43 mg/dL — ABNORMAL HIGH (ref 6–23)
Chloride: 105 mEq/L (ref 96–112)
GFR calc Af Amer: 28 mL/min — ABNORMAL LOW (ref >90)
Potassium: 3.5 mEq/L (ref 3.5–5.1)

## 2012-03-26 MED ORDER — ENOXAPARIN SODIUM 30 MG/0.3ML ~~LOC~~ SOLN
30.0000 mg | SUBCUTANEOUS | Status: DC
  Administered 2012-03-26 – 2012-03-28 (×3): 30 mg via SUBCUTANEOUS
  Filled 2012-03-26 (×3): qty 0.3

## 2012-03-26 MED ORDER — ENOXAPARIN SODIUM 60 MG/0.6ML ~~LOC~~ SOLN: 1.0000 mg/kg | SUBCUTANEOUS | Status: DC

## 2012-03-26 MED ORDER — CALCITRIOL 0.25 MCG PO CAPS
0.2500 ug | ORAL_CAPSULE | Freq: Every day | ORAL | Status: DC
  Administered 2012-03-26 – 2012-04-02 (×8): 0.25 ug via ORAL
  Filled 2012-03-26 (×8): qty 1

## 2012-03-26 NOTE — Consult Note (Signed)
Consult Note from the Palliative Medicine Team at [facility] Patient ZO:XWRU [name]      DOB: [birth year]      EAV:[id number]   Consult Requested by: Dr Dion Saucier     PCP: Elvina Sidle, MD, MD Reason for Consultation: Goals of care     Phone Number:[phone]  Assessment and Plan:    Discussed in detail with family, they do not want surgery and want to take her home. PT to evaluate, they also want home care services. They don't want Lortab unless needed for pain. Continue Tylenol for now. Family was told that patient at this time does not have any medical problems , or uncontrolled pain to qualify for hospice except hip fracture. And family is also not interested in hospice at this time. They will take her home with home health and physical therapy. Patient also is full code. Son recorded  the conversation on I phone without permission.  1. Code Status: Full code 2. Symptom Control: Pain- Well controlled with tylenol, Consider lortab if pain is not well controlled. 3. Psycho/Social: Family is supportive. 4. Disposition: Home  Patient Documents Completed or Given: Document Given Completed  Advanced Directives Pkt    MOST    DNR    Gone from My Sight    Hard Choices      Brief HPI: 78 year old female with history of mild dementia , Who was brought to the hospital after patient fell down and broke her hip. Patient had discussion with orthopedic surgeon , he recommended the patient gets hip replacement. At this time family do not want hip replacement and water the patient home with home health. Patient has very mild pain 2/10 in intensity, which is well controlled with Tylenol. Family do not want to try any opiates. Goals of care meeting was done with the patient and her son , daughter at the bedside.   ROS: Pain- 2/10, controlled with tyelenol Nausea- denies Vomiting- denies Anxiety- denies  Constipation- denies    PMH:  Past Medical History  Diagnosis Date  . Dementia   .  Hypertension   . Ulcer     Hx of  . Stomach cancer   . Snoring   . Fracture, intertrochanteric, left femur 03/25/2012  . Fracture, intertrochanteric, right femur 06/2008    s/p closed reduction and internal fixation  . Nodule of left lung     followed by Dr. Vassie Loll // LLL spiculated nodule noted per CT in 2012 1.5x1.6x2.4, PET in 06/03/2011 was low uptake.     PSH: Past Surgical History  Procedure Date  . Stomach surgery     3/4 of stomach removed due to stomach cancer  . Hip surgery 2009    right   I have reviewed the FH and SH and  If appropriate update it with new information. No Known Allergies Scheduled Meds:   . acetaminophen  650 mg Oral Q4H  . antiseptic oral rinse  15 mL Mouth Rinse BID  . calcitRIOL  0.25 mcg Oral Daily  . donepezil  10 mg Oral QPC breakfast  . enoxaparin (LOVENOX) injection  30 mg Subcutaneous Q24H  . fentaNYL  50 mcg Intravenous Once  . memantine  10 mg Oral BID  . ondansetron (ZOFRAN) IV  4 mg Intravenous Once  . sodium bicarbonate  650 mg Oral QID  . vitamin B-12  1,000 mcg Oral Q M,W,F  . DISCONTD: calcitRIOL  0.25 mcg Oral Daily  . DISCONTD: enoxaparin  30 mg Subcutaneous Q24H  .  DISCONTD: enoxaparin (LOVENOX) injection  1 mg/kg Subcutaneous Q24H   Continuous Infusions:   . sodium chloride 1,000 mL (03/26/12 0506)  . DISCONTD: sodium chloride 1,000 mL (03/25/12 1731)   PRN Meds:.ondansetron (ZOFRAN) IV, ondansetron, polyethylene glycol    BP 127/54  Pulse 65  Temp(Src) 98.1 F (36.7 C) (Oral)  Resp 19  Ht 5' (1.524 m)  Wt 46.902 kg (103 lb 6.4 oz)  BMI 20.19 kg/m2  SpO2 93%   PPS: 40%   Intake/Output Summary (Last 24 hours) at 03/26/12 1639 Last data filed at 03/26/12 1300  Gross per 24 hour  Intake    720 ml  Output    850 ml  Net   -130 ml     Physical Exam:  General: 78 y/o in no apparent distress HEENT: Normocephalic, Atraumatic Chest: Clear Bilaterally CVS: S1 S2 RRR Abdomen: Soft, nontender Ext: : No  erythema,  tenderness on movement of left hip joint Neuro: CN grossly intact, Alert Oriented x 2  Labs: CBC    Component Value Date/Time   WBC 6.4 03/26/2012 0540   WBC 6.7 09/18/2007 1403   RBC 3.18* 03/26/2012 0540   RBC 4.12 09/18/2007 1403   HGB 10.2* 03/26/2012 0540   HGB 12.7 09/18/2007 1403   HCT 31.2* 03/26/2012 0540   HCT 37.1 09/18/2007 1403   PLT 210 03/26/2012 0540   PLT 310 09/18/2007 1403   MCV 98.1 03/26/2012 0540   MCV 90.2 09/18/2007 1403   MCH 32.1 03/26/2012 0540   MCH 30.8 09/18/2007 1403   MCHC 32.7 03/26/2012 0540   MCHC 34.1 09/18/2007 1403   RDW 14.0 03/26/2012 0540   RDW 12.3 09/18/2007 1403   LYMPHSABS 0.6* 03/25/2012 1455   LYMPHSABS 0.8* 09/18/2007 1403   MONOABS 0.5 03/25/2012 1455   MONOABS 0.5 09/18/2007 1403   EOSABS 0.1 03/25/2012 1455   EOSABS 0.0 09/18/2007 1403   BASOSABS 0.1 03/25/2012 1455   BASOSABS 0.1 09/18/2007 1403    BMET    Component Value Date/Time   NA 144 03/26/2012 0540   K 3.5 03/26/2012 0540   CL 105 03/26/2012 0540   CO2 28 03/26/2012 0540   GLUCOSE 135* 03/26/2012 0540   BUN 43* 03/26/2012 0540   CREATININE 1.84* 03/26/2012 0540   CALCIUM 8.7 03/26/2012 0540   GFRNONAA 24* 03/26/2012 0540   GFRAA 28* 03/26/2012 0540    CMP     Component Value Date/Time   NA 144 03/26/2012 0540   K 3.5 03/26/2012 0540   CL 105 03/26/2012 0540   CO2 28 03/26/2012 0540   GLUCOSE 135* 03/26/2012 0540   BUN 43* 03/26/2012 0540   CREATININE 1.84* 03/26/2012 0540   CALCIUM 8.7 03/26/2012 0540   PROT 6.4 03/25/2012 1455   ALBUMIN 3.3* 03/25/2012 1455   AST 19 03/25/2012 1455   ALT 13 03/25/2012 1455   ALKPHOS 102 03/25/2012 1455   BILITOT 0.4 03/25/2012 1455   GFRNONAA 24* 03/26/2012 0540   GFRAA 28* 03/26/2012 0540     CT OF THE LEFT HIP WITHOUT CONTRAST  Technique: Multidetector CT imaging was performed according to the  standard protocol. Multiplanar CT image reconstructions were also  generated.  Comparison: Radiographs same date.  Findings: There is a  nondisplaced intertrochanteric left femur  fracture. This extends across the base of the femoral neck  medially.  The patient has severe underlying left hip osteoarthritis with  flattening of the femoral head and extensive osteophyte formation.  There are multiple intra-articular loose bodies.  There is a  moderate sized hip joint effusion. Fluid extends into the  iliopsoas bursa.  No acute fractures are seen within the visualized left hemi pelvis.  An old fracture of the right pubic ramus is partially imaged.  There are degenerative changes at the left sacroiliac joint.  Vaginal support device noted.  IMPRESSION:  1. Nondisplaced intertrochanteric left femur fracture extending  into the basicervical region medially.  2. Underlying severe left hip osteoarthritis with multiple loose  bodies.     Time In Time Out Total Time Spent with Patient Total Overall Time  4 pm 5;00 pm 30 min  60 min    Greater than 50%  of this time was spent counseling and coordinating care related to the above assessment and plan.

## 2012-03-26 NOTE — Progress Notes (Signed)
Family Medicine Teaching Service Attending Note  I interviewed and examined patient [name] and reviewed their tests and x-rays.  I discussed with Dr. Mikel Cella and reviewed their note for today.  I agree with their assessment and plan.     Additionally  Denies any pain Appreciate ortho recommendations Will see how tolerates nonoperative therapy - will need to address foley and toileting

## 2012-03-26 NOTE — Progress Notes (Signed)
Clinical Child psychotherapist (CSW) received referral for skilled nursing facility placement however CSW observed that pt and family will be having a palliative care meeting today. CSW will facilitate with disposition once outcome determined at meeting.  Theresia Bough, MSW, Theresia Majors [phone]

## 2012-03-26 NOTE — Progress Notes (Signed)
Palliative Medicine Team consult to confirm goals of care, assist with symptom management requested by Dr Dion Saucier and confirmed with attending service -daughter and son Nida Boatman in room- meeting scheduled for today Monday, 03/26/12 @ 4:00 pm.    Valente David, RN 03/26/2012, 10:50 AM Palliative Medicine Team RN Liaison [phone]

## 2012-03-26 NOTE — Progress Notes (Signed)
Patient ID: [name], female   DOB: [birth year], 78 y.o.   MRN: [id number]          Subjective:  Patient reports pain as mild.  Resting in bed.  Objective:   VITALS:   Filed Vitals:   03/25/12 1732 03/25/12 1739 03/25/12 2205 03/26/12 0504  BP:  145/58 111/50 140/59  Pulse: 65  64 69  Temp:   97.3 F (36.3 C) 97.9 F (36.6 C)  TempSrc:   Oral Oral  Resp:   18 18  Height:  5' (1.524 m) 5' (1.524 m)   Weight:  47.628 kg (105 lb) 46.902 kg (103 lb 6.4 oz)   SpO2: 98%  96% 95%    Neurologically intact Dorsiflexion/Plantar flexion intact  LABS Lab Results  Component Value Date   HGB 10.2* 03/26/2012   HGB 11.3* 03/25/2012   HGB 11.4* 03/25/2012   CBC    Component Value Date/Time   WBC 6.4 03/26/2012 0540   WBC 6.7 09/18/2007 1403   RBC 3.18* 03/26/2012 0540   RBC 4.12 09/18/2007 1403   HGB 10.2* 03/26/2012 0540   HGB 12.7 09/18/2007 1403   HCT 31.2* 03/26/2012 0540   HCT 37.1 09/18/2007 1403   PLT 210 03/26/2012 0540   PLT 310 09/18/2007 1403   MCV 98.1 03/26/2012 0540   MCV 90.2 09/18/2007 1403   MCH 32.1 03/26/2012 0540   MCH 30.8 09/18/2007 1403   MCHC 32.7 03/26/2012 0540   MCHC 34.1 09/18/2007 1403   RDW 14.0 03/26/2012 0540   RDW 12.3 09/18/2007 1403   LYMPHSABS 0.6* 03/25/2012 1455   LYMPHSABS 0.8* 09/18/2007 1403   MONOABS 0.5 03/25/2012 1455   MONOABS 0.5 09/18/2007 1403   EOSABS 0.1 03/25/2012 1455   EOSABS 0.0 09/18/2007 1403   BASOSABS 0.1 03/25/2012 1455   BASOSABS 0.1 09/18/2007 1403    Lab Results  Component Value Date   INR 1.4 07/03/2008   Lab Results  Component Value Date   NA 144 03/26/2012   K 3.5 03/26/2012   CL 105 03/26/2012   CO2 28 03/26/2012   BUN 43* 03/26/2012   CREATININE 1.84* 03/26/2012   GLUCOSE 135* 03/26/2012     Assessment/Plan: Active Problems:  Fall from standing  Trauma left hip  Dementia  Left intertrochanteric hip fracture, avascular necrosis left hip.  I had a long discussion with the family, approximately 30 minutes,  involving both caretakers/durable power of attorney, son and daughter. Mrs. [name] was also able to express her wishes, fairly clearly. They have decided for nonoperative care, at least for the time being. This would involve palliative care, nonweightbearing, left lower extremity, and transferred to chair and bedside commode as tolerated.  We've also discussed the risks of DVT, and the possibility of anticoagulation for the next month or so, and I will defer this decision to the medical team.  If in fact, over the next couple of days, they decide to pursue surgical intervention, please let me know. I provided them with my contact information as well.  Thanks.  Chelcee Korpi P 03/26/2012, 8:19 AM   Teryl Lucy, MD [phone] pager

## 2012-03-26 NOTE — Progress Notes (Signed)
Physical Therapy Note:   03/26/12 1300  PT Visit Information  Reason Eval/Treat Not Completed Orders received, chart reviewed; noted family choosing not to proceed with surgery, and Palliative Care Team consulted for goals of care  If goals of care are primarily for comfort, PT may not be indicated (as PT interventions are often not comfortable, especially with hip fracture)  If PT fits into goals of care, will proceed with PT evaluation/treatment -- will need a Weight Bearing status (would proceed NWB unless otherwise ordered)   PT Plan   Will hold PT eval, awaiting decisions re: goals of care and Palliative Care Consult  Please advise if PT is congruent with goals of care    [place], [place] [phone]

## 2012-03-26 NOTE — Progress Notes (Signed)
PGY-1 Daily Progress Note Family Medicine Teaching Service Paylin Hailu M. Fumie Fiallo, MD Service Pager: [phone]  Subjective: No acute events overnight. Patient continues to have pain, but states it is well enough controlled on Tylenol. Ortho consulted; family has decided to not go forward with surgery at this time.  Objective: Vital signs in last 24 hours: Temp:  [96.5 F (35.8 C)-97.9 F (36.6 C)] 97.9 F (36.6 C) (04/15 0504) Pulse Rate:  [60-69] 69  (04/15 0504) Resp:  [16-18] 18  (04/15 0504) BP: (111-147)/(50-68) 140/59 mmHg (04/15 0504) SpO2:  [94 %-98 %] 95 % (04/15 0504) Weight:  [103 lb 6.4 oz (46.902 kg)-105 lb (47.628 kg)] 103 lb 6.4 oz (46.902 kg) (04/14 2205) Weight change:  Last BM Date: 03/25/12  Intake/Output from previous day: 04/14 0701 - 04/15 0700 In: 240 [P.O.:240] Out: 850 [Urine:850] Intake/Output this shift:   Physical Exam: Constitutional: She appears well-developed and well-nourished. She appears comfortable lying in bed. HENT:  Normocephalic. Chin tender to palpation  Cardiovascular: Normal rate and regular rhythm.  Respiratory: Effort normal and breath sounds normal.  GI: Soft.  Musculoskeletal: Patient lying still in bed. Does not move hip or knee. Elevated on pillow. Neurological: She is alert. Answers questions appropriately.  Skin: Skin is warm and dry.  Psychiatric: She has a normal mood and affect.   Lab Results:  [facility] 03/26/12 0540 03/25/12 1843  WBC 6.4 11.2*  HGB 10.2* 11.3*  HCT 31.2* 34.6*  PLT 210 224   BMET  Basename 03/26/12 0540 03/25/12 1843 03/25/12 1455  NA 144 -- 137  K 3.5 -- 3.5  CL 105 -- 99  CO2 28 -- 29  GLUCOSE 135* -- 127*  BUN 43* -- 48*  CREATININE 1.84* 1.70* --  CALCIUM 8.7 -- 9.1    Studies/Results: Dg Mandible 4 Views  03/25/2012  *RADIOLOGY REPORT*  Clinical Data: Status post fall.  Chin bruising and pain.  MANDIBLE - 4+ VIEW  Comparison: None.  Findings: No evidence of acute mandible fracture or  TMJ dislocation.  IMPRESSION: No acute osseous findings.  Original Report Authenticated By: Gerrianne Scale, M.D.   Dg Chest 1 View  03/25/2012  *RADIOLOGY REPORT*  Clinical Data: Left hip fracture.  Hypertension.  CHEST - 1 VIEW  Comparison: Radiographs 06/28/2008.  CT 11/15/2011.  Findings: Previously demonstrated left lower lobe pulmonary nodule is not well seen on this portable examination and may be smaller. The right lung appears clear.  There is no pleural effusion or pneumothorax.  Heart size and mediastinal contours are stable. Glenohumeral degenerative changes are present bilaterally.  IMPRESSION:  1.  No acute cardiopulmonary process seen. 2.  Known left lower lobe pulmonary nodule not well visualized.  Original Report Authenticated By: Gerrianne Scale, M.D.   Dg Cervical Spine Complete  03/25/2012  *RADIOLOGY REPORT*  Clinical Data: Fall, neck pain  CERVICAL SPINE - COMPLETE 4+ VIEW  Comparison: PET CT scan 06/01/2011  Findings: No lateral projection is provided.  There is no gross evidence of vertebral body fracture.  Open mouth odontoid view appears normal.  IMPRESSION: No gross evidence of fracture.  Exam is limited in that there is no lateral projection.  Best images were obtained due to patient immobility.  If concern for cervical spine fracture recommend CT of the neck.  Original Report Authenticated By: Genevive Bi, M.D.   Dg Hip Complete Left  03/25/2012  *RADIOLOGY REPORT*  Clinical Data: Pain status post fall.  LEFT HIP - COMPLETE 2+ VIEW  Comparison:  PET CT 06/03/2011.  Findings: There is a nondisplaced intertrochanteric left femur fracture.  There is severe underlying left hip osteoarthritis with flattening of the femoral head and osteophyte formation. This may be secondary to remote femoral head osteonecrosis.  Patient is status post right femoral fixation and L5 spinal augmentation. There is deformity of the right pubic bone consistent with an old fracture.  No acute pelvic  fractures are identified.  IMPRESSION: Nondisplaced intertrochanteric fracture of the left femur.  Severe left hip osteoarthritis.  Clinically significant discrepancy from primary report, if provided: None  Original Report Authenticated By: Gerrianne Scale, M.D.   Ct Hip Left Wo Contrast  03/25/2012  *RADIOLOGY REPORT*  Clinical Data: Left hip pain status post fall.  Evaluate fracture.  CT OF THE LEFT HIP WITHOUT CONTRAST  Technique:  Multidetector CT imaging was performed according to the standard protocol. Multiplanar CT image reconstructions were also generated.  Comparison: Radiographs same date.  Findings: There is a nondisplaced intertrochanteric left femur fracture.  This extends across the base of the femoral neck medially.  The patient has severe underlying left hip osteoarthritis with flattening of the femoral head and extensive osteophyte formation. There are multiple intra-articular loose bodies.  There is a moderate sized hip joint effusion.  Fluid extends into the iliopsoas bursa.  No acute fractures are seen within the visualized left hemi pelvis. An old fracture of the right pubic ramus is partially imaged. There are degenerative changes at the left sacroiliac joint. Vaginal support device noted.  IMPRESSION:  1.  Nondisplaced intertrochanteric left femur fracture extending into the basicervical region medially. 2.  Underlying severe left hip osteoarthritis with multiple loose bodies.  Original Report Authenticated By: Gerrianne Scale, M.D.    Medications:  I have reviewed the patient's current medications. Scheduled:   . acetaminophen  650 mg Oral Q4H  . antiseptic oral rinse  15 mL Mouth Rinse BID  . calcitRIOL  0.25 mcg Oral Daily  . donepezil  10 mg Oral QPC breakfast  . enoxaparin (LOVENOX) injection  1 mg/kg Subcutaneous Q24H  . fentaNYL  50 mcg Intravenous Once  . memantine  10 mg Oral BID  . ondansetron (ZOFRAN) IV  4 mg Intravenous Once  . sodium bicarbonate  650 mg  Oral QID  . vitamin B-12  1,000 mcg Oral Q M,W,F  . DISCONTD: enoxaparin  30 mg Subcutaneous Q24H   Continuous:   . sodium chloride 1,000 mL (03/26/12 0506)  . DISCONTD: sodium chloride 1,000 mL (03/25/12 1731)   AVW:[id number] (ZOFRAN) IV, ondansetron, polyethylene glycol  Assessment/Plan: 78 yo F with dementia presenting with left hip fracture s/p fall   # Left hip fracture- Seen on Xray and CT scan. Ortho has been consulted. Patient and her family have decided to not have surgery at this time. - Ortho recommends nonweightbearing, Lovenox daily for at least 30 days, CSW consult, CM consult, PC consult, PT/OT. Patient and family are aware of these consults and agree at this time. - Pain control with scheduled Tylenol q6 hours. On calcitriol at home. Will add stronger pain medication, if requested  - Continue Lovenox (adjusted for creat clearance)  - We appreciate Dr. Shelba Flake consult and excellent care of this patient  #S/P fall- Patient denies any symptoms surrounding fall. Denies LOC.  - Monitor on telemetry. No syncope workup at this time  - Patient had impact to chin as well. Xrays negative.  # Dementia- Followed by neurology, on medications at home. Stable at this  time, and has been improving in recent months per son's report  - Continue Namenda and Aricept   # CKD- Followed as an outpatient by nephrology. Creat 1.76 at admission  - Give gentle fluids  - Hold home Lasix for now  - Avoid nephrotoxic agents  - Recheck BMET in the AM   # FEN/GI- Regular diet. NS @ 75 cc/hr. Patient may be NPO after midnight according to surgery recommendations.   # PPx- Lovenox (adjusted for creat clearance)   # Dispo- Pending consults and family decisions  Lives with son, Kida Digiulio [phone].  Code status: Full   LOS: 1 day   Jazyah Butsch 03/26/2012, 8:55 AM

## 2012-03-27 ENCOUNTER — Encounter (HOSPITAL_COMMUNITY)
Admission: EM | Disposition: A | Discharge status: Skilled Nursing Facility | Payer: Self-pay | Source: Home / Self Care | Attending: Family Medicine

## 2012-03-27 LAB — BASIC METABOLIC PANEL
CO2: 24 mEq/L (ref 19–32)
Glucose, Bld: 115 mg/dL — ABNORMAL HIGH (ref 70–99)
Potassium: 3.8 mEq/L (ref 3.5–5.1)
Sodium: 140 mEq/L (ref 135–145)

## 2012-03-27 LAB — URINE CULTURE

## 2012-03-27 LAB — CBC
Hemoglobin: 9.9 g/dL — ABNORMAL LOW (ref 12.0–15.0)
RBC: 3.16 MIL/uL — ABNORMAL LOW (ref 3.87–5.11)

## 2012-03-27 SURGERY — ARTHROPLASTY, HIP, TOTAL,POSTERIOR APPROACH
Anesthesia: General | Laterality: Left

## 2012-03-27 MED ORDER — TRAMADOL HCL 50 MG PO TABS
50.0000 mg | ORAL_TABLET | Freq: Two times a day (BID) | ORAL | Status: DC
  Administered 2012-03-27 – 2012-03-28 (×3): 50 mg via ORAL
  Filled 2012-03-27 (×4): qty 1

## 2012-03-27 MED ORDER — HYDROCODONE-ACETAMINOPHEN 5-325 MG PO TABS
0.5000 | ORAL_TABLET | ORAL | Status: DC | PRN
  Administered 2012-03-28 – 2012-03-29 (×3): 0.5 via ORAL
  Filled 2012-03-27 (×3): qty 1

## 2012-03-27 MED ORDER — ACETAMINOPHEN 500 MG PO TABS
500.0000 mg | ORAL_TABLET | ORAL | Status: DC
  Administered 2012-03-27 – 2012-04-02 (×22): 500 mg via ORAL
  Filled 2012-03-27 (×43): qty 1

## 2012-03-27 NOTE — Clinical Documentation Improvement (Signed)
CKD DOCUMENTATION CLARIFICATION QUERY   THIS DOCUMENT IS NOT A PERMANENT PART OF THE MEDICAL RECORD  TO RESPOND TO THE THIS QUERY, FOLLOW THE INSTRUCTIONS BELOW:  1. If needed, update documentation for the patient's encounter via the notes activity.  2. Access this query again and click edit on the In [employer].  3. After updating, or not, click F2 to complete all highlighted (required) fields concerning your review. Select "additional documentation in the medical record" OR "no additional documentation provided".  4. Click Sign note button.  5. The deficiency will fall out of your In Basket *Please let us know if you are not able to complete this workflow by phone or e-mail (listed below).  Please update your documentation within the medical record to reflect your response to this query.                                                                                        03/27/12   Dear Dr. Jeffrie Stander:/Associates,  In a better effort to capture your patient's severity of illness, reflect appropriate length of stay and utilization of resources, a review of the patient medical record has revealed the following indicators.    Based on your clinical judgment, please clarify and document in a progress note and/or discharge summary the clinical condition associated with the following supporting information:  In responding to this query please exercise your independent judgment.  The fact that a query is asked, does not imply that any particular answer is desired or expected.  Possible Clinical Conditions?   _______CKD Stage I -  GFR > OR = 90 CKD Stage II - GFR 60-80 CKD Stage III - GFR 30-59 CKD Stage IV - GFR 15-29 CKD Stage V - GFR < 15 ESRD (End Stage Renal Disease) Other condition Cannot Clinically determine   Supporting Information:  Risk Factors:( As per notes)" CKD" " followed by outpt nephrologist"  Signs & Symptoms: Elevated BUN & Creatinine  Diagnostics:  Lab: BUN               4-14=48*  4-15=43*   CREATININE  4-15= 1.84*  4-14=1.70  Calculated GFR: 4-14 = 29  Treatment: (As per H&P) - Give gentle fluids - Hold home Lasix for now - Avoid nephrotoxic agents - Recheck BMET in the AM   You may use possible, probable, or suspect with inpatient documentation. possible, probable, suspected diagnoses MUST be documented at the time of discharge  Reviewed:  no additional documentation provided   Thank You,  Joanette Gula Delk RN,BSN  Clinical Documentation Specialist: [phone] Pager Health Information Management [place]

## 2012-03-27 NOTE — Progress Notes (Signed)
Order received, chart reviewed, noted Pallative Care Consult note, and have spoken to PT that is following pt. At this point PT is the most important to look at getting the pt moving since family will definitely have to do BADLs for her. Depending on how pt is moving once PT evals her, they will recommend if 3-n-1 appropriate. Acute OT will sign off. HHOT may be appropriate, the HHPT can recommend this once they start seeing pt. Ignacia Palma, [place] [phone] 03/27/2012

## 2012-03-27 NOTE — Progress Notes (Signed)
Patient ID: [name], female   DOB: [birth year], 78 y.o.   MRN: [id number] PGY-1 Daily Progress Note Family Medicine Teaching Service [name] M. Leslie Kalka, MD Service Pager: [phone]  Subjective: No acute events overnight. Patient's family has decided to take her home with HH/PT. Not open to Hospice or SNF at this time. Patient is in more pain this morning and requesting stronger pain medication.  Objective: Vital signs in last 24 hours: Temp:  [97.8 F (36.6 C)-98.1 F (36.7 C)] 98 F (36.7 C) (04/16 0430) Pulse Rate:  [60-78] 78  (04/16 0430) Resp:  [18-19] 18  (04/16 0430) BP: (114-146)/(50-68) 134/68 mmHg (04/16 0430) SpO2:  [93 %-96 %] 96 % (04/16 0430) Weight:  [108 lb 0.4 oz (49 kg)] 108 lb 0.4 oz (49 kg) (04/15 2130) Weight change: 3 lb 0.4 oz (1.372 kg) Last BM Date: 03/25/12  Intake/Output from previous day: 04/15 0701 - 04/16 0700 In: 480 [P.O.:480] Out: -  Intake/Output this shift:   Physical Exam: Constitutional: She appears well-developed and well-nourished. She appears comfortable lying in bed. HENT:  Normocephalic. Chin with visible bruising, nontender. Cardiovascular: Normal rate and regular rhythm.  Respiratory: Effort normal and breath sounds normal.  GI: Soft.  Musculoskeletal: Patient lying still in bed. Does not move hip or knee. Elevated on pillow and slightly bent. Tenderness with palpation of left calf, but she states the pain is in her hip, not her calf. Neurological: She is alert. Answers questions appropriately.  Skin: Skin is warm and dry.  Psychiatric: She has a normal mood and affect.   Lab Results:  [facility] 03/27/12 0615 03/26/12 0540  WBC 7.1 6.4  HGB 9.9* 10.2*  HCT 30.8* 31.2*  PLT 179 210   BMET  Basename 03/27/12 0615 03/26/12 0540  NA 140 144  K 3.8 3.5  CL 106 105  CO2 24 28  GLUCOSE 115* 135*  BUN 38* 43*  CREATININE 1.60* 1.84*  CALCIUM 8.3* 8.7    Studies/Results: Dg Mandible 4 Views  03/25/2012  *RADIOLOGY REPORT*   Clinical Data: Status post fall.  Chin bruising and pain.  MANDIBLE - 4+ VIEW  Comparison: None.  Findings: No evidence of acute mandible fracture or TMJ dislocation.  IMPRESSION: No acute osseous findings.  Original Report Authenticated By: Gerrianne Scale, M.D.   Dg Chest 1 View  03/25/2012  *RADIOLOGY REPORT*  Clinical Data: Left hip fracture.  Hypertension.  CHEST - 1 VIEW  Comparison: Radiographs 06/28/2008.  CT 11/15/2011.  Findings: Previously demonstrated left lower lobe pulmonary nodule is not well seen on this portable examination and may be smaller. The right lung appears clear.  There is no pleural effusion or pneumothorax.  Heart size and mediastinal contours are stable. Glenohumeral degenerative changes are present bilaterally.  IMPRESSION:  1.  No acute cardiopulmonary process seen. 2.  Known left lower lobe pulmonary nodule not well visualized.  Original Report Authenticated By: Gerrianne Scale, M.D.   Dg Cervical Spine Complete  03/25/2012  *RADIOLOGY REPORT*  Clinical Data: Fall, neck pain  CERVICAL SPINE - COMPLETE 4+ VIEW  Comparison: PET CT scan 06/01/2011  Findings: No lateral projection is provided.  There is no gross evidence of vertebral body fracture.  Open mouth odontoid view appears normal.  IMPRESSION: No gross evidence of fracture.  Exam is limited in that there is no lateral projection.  Best images were obtained due to patient immobility.  If concern for cervical spine fracture recommend CT of the neck.  Original Report  Authenticated By: Genevive Bi, M.D.   Dg Hip Complete Left  03/25/2012  *RADIOLOGY REPORT*  Clinical Data: Pain status post fall.  LEFT HIP - COMPLETE 2+ VIEW  Comparison: PET CT 06/03/2011.  Findings: There is a nondisplaced intertrochanteric left femur fracture.  There is severe underlying left hip osteoarthritis with flattening of the femoral head and osteophyte formation. This may be secondary to remote femoral head osteonecrosis.  Patient is  status post right femoral fixation and L5 spinal augmentation. There is deformity of the right pubic bone consistent with an old fracture.  No acute pelvic fractures are identified.  IMPRESSION: Nondisplaced intertrochanteric fracture of the left femur.  Severe left hip osteoarthritis.  Clinically significant discrepancy from primary report, if provided: None  Original Report Authenticated By: Gerrianne Scale, M.D.   Ct Hip Left Wo Contrast  03/25/2012  *RADIOLOGY REPORT*  Clinical Data: Left hip pain status post fall.  Evaluate fracture.  CT OF THE LEFT HIP WITHOUT CONTRAST  Technique:  Multidetector CT imaging was performed according to the standard protocol. Multiplanar CT image reconstructions were also generated.  Comparison: Radiographs same date.  Findings: There is a nondisplaced intertrochanteric left femur fracture.  This extends across the base of the femoral neck medially.  The patient has severe underlying left hip osteoarthritis with flattening of the femoral head and extensive osteophyte formation. There are multiple intra-articular loose bodies.  There is a moderate sized hip joint effusion.  Fluid extends into the iliopsoas bursa.  No acute fractures are seen within the visualized left hemi pelvis. An old fracture of the right pubic ramus is partially imaged. There are degenerative changes at the left sacroiliac joint. Vaginal support device noted.  IMPRESSION:  1.  Nondisplaced intertrochanteric left femur fracture extending into the basicervical region medially. 2.  Underlying severe left hip osteoarthritis with multiple loose bodies.  Original Report Authenticated By: Gerrianne Scale, M.D.    Medications:  I have reviewed the patient's current medications. Scheduled:    . acetaminophen  650 mg Oral Q4H  . antiseptic oral rinse  15 mL Mouth Rinse BID  . calcitRIOL  0.25 mcg Oral Daily  . donepezil  10 mg Oral QPC breakfast  . enoxaparin (LOVENOX) injection  30 mg Subcutaneous  Q24H  . memantine  10 mg Oral BID  . sodium bicarbonate  650 mg Oral QID  . vitamin B-12  1,000 mcg Oral Q M,W,F  . DISCONTD: calcitRIOL  0.25 mcg Oral Daily  . DISCONTD: enoxaparin  30 mg Subcutaneous Q24H  . DISCONTD: enoxaparin (LOVENOX) injection  1 mg/kg Subcutaneous Q24H   Continuous:    . sodium chloride 1,000 mL (03/26/12 1836)   JYN:[id number] (ZOFRAN) IV, ondansetron, polyethylene glycol  Assessment/Plan: 78 yo F with dementia presenting with left hip fracture s/p fall   # Left hip fracture- Seen on Xray and CT scan. Ortho has been consulted. Patient and her family have decided to not have surgery at this time. - Ortho recommends nonweightbearing, Lovenox daily for at least 30 days, CSW consult, CM consult, PC consult, PT/OT. Patient and family are aware of these consults and agree at this time. - Pain control with scheduled Tylenol 500mg  q4 hours. On calcitriol at home. Requesting stronger pain medication. Will give Lortab 5/325 half tablet q4prn - Continue Lovenox (adjusted for creat clearance)  - We appreciate Dr. Shelba Flake consult and excellent care of this patient  #S/P fall- Patient denies any symptoms surrounding fall. Denies LOC.  - Monitor on  telemetry. No syncope workup at this time. Can d/c tele today  - Patient had impact to chin as well. Xrays negative.  # Dementia- Followed by neurology, on medications at home. Stable at this time, and has been improving in recent months per son's report  - Continue Namenda and Aricept   # CKD- Followed as an outpatient by nephrology. Creat 1.76 at admission, improved to 1.6 today.  - Received IVF, will KVO for now since she is taking good PO. - Hold home Lasix for now  - Avoid nephrotoxic agents  - History of anemia secondary to CKD. HgB 9.9 today. Followed by nephro as an outpatient.  # FEN/GI- Regular diet. NS @ to [facility]  # PPx- Lovenox (adjusted for creat clearance)   # Dispo- Pending PT evaluation and arranging home  health needs.   Lives with son, Korena Nass [phone].  Code status: Full   LOS: 2 days   Fabyan Loughmiller 03/27/2012, 8:36 AM

## 2012-03-27 NOTE — Progress Notes (Signed)
Family Medicine Teaching Service Attending Note  I interviewed and examined patient [name] and reviewed their tests and x-rays.  I discussed with Dr. Mikel Cella and reviewed their note for today.  I agree with their assessment and plan.     Additionally  Has significant pain with movement.   Did have bowel movement Discussed with patient and family member she is a high risk for many complications with a nonoperative approach and will likely endure significant pain.  I will try to talk further with son

## 2012-03-27 NOTE — Progress Notes (Addendum)
Physical Therapy Evaluation Patient Details Name: [name] MRN: [id number] DOB: 1926-11-29 Today's Date: 03/27/2012  Problem List:  Patient Active Problem List  Diagnoses  . Lung nodule  . Fall from standing  . Fracture, intertrochanteric, left femur  . Dementia    Past Medical History:  Past Medical History  Diagnosis Date  . Dementia   . Hypertension   . Ulcer     Hx of  . Stomach cancer   . Snoring   . Fracture, intertrochanteric, left femur 03/25/2012  . Fracture, intertrochanteric, right femur 06/2008    s/p closed reduction and internal fixation  . Nodule of left lung     followed by Dr. Vassie Loll // LLL spiculated nodule noted per CT in 2012 1.5x1.6x2.4, PET in 06/03/2011 was low uptake.   Past Surgical History:  Past Surgical History  Procedure Date  . Stomach surgery     3/4 of stomach removed due to stomach cancer  . Hip surgery 2009    right    PT Assessment/Plan/Recommendation PT Assessment Clinical Impression Statement:  78 yo female admitted post fall resulting in Left Intertrochanteric fracture, family has chosen non-operative course; Presents to PT with decreased functional mobility, pain with all movement (which seems to subside when pt settles down); Of note, pt is participating well with PT, though difficulty maintaining NWB (tended to TDWB LLE in standing) and based on her basic transfers/ mobility today, pt does show good rehab potential whether family chooses operative or non-operative path; Lengthy discussion with son re: rehab potential with either non-operative versus operative path, level of care necessary for safe dc home, and positioning to prevent contracture,   With knowledge that family's wishes are for pt to return home, these are recommendations for dc home:  24 hour max to total assist  HHPT/OT/RN/Aide  Hospital bed (possibly with air overlay)  Wheelchair with cushion  Bedside commode (possibly drop-arm)  Ambulance transport or medical  transport Zenaida Niece with pt riding in wheelchair) home If unable to arrange for 24 hour max/tot assist in home, must consider SNF  Considering good participation with PT, and difficulty keeping NWBing LLE in standing/transfers, this writer feels surgical intervention is still a good option for pt -- as it's possible pt may be able to put some weight on LLE postop; Discussed above with MD Also of note, discussion with son re: plan of care was recorded by pt's son on his iPhone PT Recommendation/Assessment: Patient will need skilled PT in the acute care venue PT Problem List: Decreased strength;Decreased range of motion;Decreased activity tolerance;Decreased balance;Decreased mobility;Decreased coordination;Decreased cognition;Decreased knowledge of use of DME;Pain;Decreased knowledge of precautions PT Therapy Diagnosis : Acute pain;Difficulty walking PT Plan PT Frequency: Min 5X/week PT Treatment/Interventions: DME instruction;Stair training;Functional mobility training;Therapeutic activities;Therapeutic exercise;Patient/family education;Wheelchair mobility training (add gait to care plan if pt can maintain NWB LLE) PT Recommendation Recommendations for Other Services:  (Case Management/ Social Work) Follow Up Recommendations: Home health PT;Supervision/Assistance - 24 hour;Skilled nursing facility (If unable to arrange for 24 hour max/tot assist, must consider SNF) Equipment Recommended: Rolling walker with 5" wheels;3 in 1 bedside comode;Wheelchair (measurements);Wheelchair cushion (measurements) [facility] bed, with possible air overlay, Ambulance transport) PT Goals  Acute Rehab PT Goals PT Goal Formulation: With patient/family Time For Goal Achievement: 2 weeks Pt will Roll Supine to Right Side: with min assist;with rail PT Goal: Rolling Supine to Right Side - Progress: Goal set today Pt will Roll Supine to Left Side: with mod assist;with rail PT Goal: Rolling Supine  to Left Side - Progress:  Goal set today Pt will go Supine/Side to Sit: with mod assist;with rail PT Goal: Supine/Side to Sit - Progress: Goal set today Pt will go Sit to Supine/Side: with mod assist;with rail PT Goal: Sit to Supine/Side - Progress: Goal set today Pt will go Sit to Stand: with mod assist PT Goal: Sit to Stand - Progress: Goal set today Pt will go Stand to Sit: with mod assist PT Goal: Stand to Sit - Progress: Goal set today Pt will Transfer Bed to Chair/Chair to Bed: with mod assist PT Transfer Goal: Bed to Chair/Chair to Bed - Progress: Goal set today Additional Goals Additional Goal #1: Family will demonstrate proper body mechanics when providing assist/moving patient PT Goal: Additional Goal #1 - Progress: Goal set today  PT Evaluation Precautions/Restrictions  Precautions Precautions: Fall Precaution Comments: Very painful with any motion Restrictions Weight Bearing Restrictions: Yes LLE Weight Bearing: Non weight bearing Prior Functioning  Home Living Lives With: Son Available Help at Discharge: Family (Son will look into getting 24 hour assist) Type of Home: House Home Access: Stairs to enter (Son arranging for ramp) Secretary/administrator of Steps: 2 Entrance Stairs-Rails:  (Not sure) Home Layout: One level Home Adaptive Equipment: None Prior Function Level of Independence: Independent (with simple mobility) Comments: Per aon, pt was independent with basic mobility and ADLs; tended to furniture walk Cognition Cognition Arousal/Alertness: Awake/alert Overall Cognitive Status: History of cognitive impairments History of Cognitive Impairment: Appears at baseline functioning (Pt with h/o dementia) Orientation Level: Oriented to person;Oriented to situation;Disoriented to time;Oriented to place Cognition - Other Comments: Pt very pleasant, able to follow commands, participating with PT even through pain Sensation/Coordination Sensation Light Touch: Appears  Intact Coordination Gross Motor Movements are Fluid and Coordinated: No Fine Motor Movements are Fluid and Coordinated: Not tested Coordination and Movement Description: All movement and mobility limited by left hip pain Extremity Assessment RUE Assessment RUE Assessment: Within Functional Limits LUE Assessment LUE Assessment: Within Functional Limits RLE Assessment RLE Assessment: Within Functional Limits (though Lhip painful with RLE movement) LLE Assessment LLE Assessment:  (Extremely painful with all movement; tending to keep hip in flexed position)  Educated pt and son on importance of working on getting hip to neutral position to prevent secondary musculoskeletal complications like hip flexion contracture while healing Mobility (including Balance) Bed Mobility Bed Mobility: Yes Rolling Right: 1: +2 Total assist;Patient percentage (comment) (pt=25%) Rolling Right Details (indicate cue type and reason): cues and physical assist to initiate and to use rails; used pillow between legs for hopefully more comfort Rolling Left: 1: +2 Total assist;Patient percentage (comment) (pt<10%) Rolling Left Details (indicate cue type and reason): Semi-roll for hygeine; very painful as she rolled onto Left side Supine to Sit: 1: +2 Total assist;Patient percentage (comment);HOB flat;With rails (pt=40%) Supine to Sit Details (indicate cue type and reason): Very painful with movement; +2 physical assist to clear feet off EOB (ease LLE down) and elevate trunk off of bed; Good push from rails Sit to Supine: 1: +2 Total assist;Patient percentage (comment) (pt<20%) Sit to Supine - Details (indicate cue type and reason): total physical assist to help LEs into bed and ease trunk down to lying position Transfers Transfers: Yes Sit to Stand: 1: +2 Total assist;Patient percentage (comment);From bed (pt=40%; second person present for safety) Sit to Stand Details (indicate cue type and reason): max cues for NWB LLE;  Good weight acceptance and standing on RLE; support and physical assist given to patient at  bil UEs and trunk (gait belt); Blocked right knee initially for safety and stability, but pt was able maintain knee extension in stance; tended to TDWB LLE in standing despite cues Stand to Sit: 1: +2 Total assist;Patient percentage (comment);To bed (pt =50%) Stand to Sit Details: Physical assist to control descent Ambulation/Gait Ambulation/Gait: No  Balance Balance Assessed: Yes Static Sitting Balance Static Sitting - Balance Support: Bilateral upper extremity supported Static Sitting - Level of Assistance: 4: Min assist Static Sitting - Comment/# of Minutes: Sat EOB for approx 5 minutes with min assist and R hand hold to rails    End of Session PT - End of Session Equipment Utilized During Treatment: Gait belt Activity Tolerance: Patient limited by pain;Patient tolerated treatment well (less pain once settled down) Patient left: in bed;with call bell in reach;with family/visitor present Nurse Communication: Mobility status for transfers General Behavior During Session: [facility] for tasks performed Cognition: Impaired, at baseline  Van Clines [facility] [place], [place] [phone]  03/27/2012, 1:50 PM

## 2012-03-28 ENCOUNTER — Telehealth: Payer: Self-pay

## 2012-03-28 LAB — BASIC METABOLIC PANEL
Calcium: 8.4 mg/dL (ref 8.4–10.5)
Chloride: 107 mEq/L (ref 96–112)
Creatinine, Ser: 1.53 mg/dL — ABNORMAL HIGH (ref 0.50–1.10)
GFR calc Af Amer: 35 mL/min — ABNORMAL LOW (ref >90)
GFR calc non Af Amer: 30 mL/min — ABNORMAL LOW (ref >90)

## 2012-03-28 LAB — CBC
MCH: 31.6 pg (ref 26.0–34.0)
MCHC: 32 g/dL (ref 30.0–36.0)
MCV: 98.8 fL (ref 78.0–100.0)
Platelets: 187 10*3/uL (ref 150–400)
RDW: 13.8 % (ref 11.5–15.5)
WBC: 5.8 10*3/uL (ref 4.0–10.5)

## 2012-03-28 LAB — PREPARE RBC (CROSSMATCH)

## 2012-03-28 LAB — GLUCOSE, CAPILLARY: Glucose-Capillary: 89 mg/dL (ref 70–99)

## 2012-03-28 NOTE — Progress Notes (Signed)
Family Medicine Teaching Service Attending Note  I interviewed and examined patient [name] and reviewed their tests and x-rays.  I discussed with Dr. Mikel Cella and reviewed their note for today.  I agree with their assessment and plan.     Additionally  Had two conversations with patient and son.  She gives contradictory responses to yes or no she will have surgery within 10 minutes of each other.   I do not feel she is competent to make the decision.  Her son is considering and will have an decision for [place] this afternoon If they do not want surgery she should be discharged to home for familiar surroundigns to hopefully improve or maintain her mental status as best as possible I have told them several times that her risk of complications is quite high with a nonoperative approach

## 2012-03-28 NOTE — Progress Notes (Signed)
PGY-1 Progress Note  Spoke again with patient and son, [name], about patient's plan of care. He states adamantly that patient will not have surgery. He states he "could not ethically put her through a procedure she does not want." Patient states she does not want surgery because she does not want to go to the operating room. Again, patient does not have full recall of recent events, but patient's son does not want to force her to do something that he feels she does not want.  Discussed with him that if she is non-operative then we can send her home today. He states he is not ready at home for her. Told him she would be discharged tomorrow and we would have our case management, Elnita Maxwell, to help arrange the resources required by PT.   [name] M. Taura Lamarre, M.D. 03/28/2012 4:51 PM

## 2012-03-28 NOTE — Telephone Encounter (Signed)
Pt son [name] would really like to speak with Dr L his mom is in the hospital please contact pt as soon as possible.

## 2012-03-28 NOTE — Progress Notes (Signed)
Met with pt son Nida Boatman, and PT Jeanice Lim re pt care plan. Jeanice Lim has expertly demonstrated to the family the care needed to be provided to this pt if they d/c directly to home. The pt experiences extreme pain with any mobility, yet mobility is absolutely necessary. The son is now convinced that this is too painful and that two months of this care would be too much for this mother. He will discuss with his sister and the patient and is now leaning toward a surgical repair. This CM notified Dr Clinton Sawyer of family current plan.  Johny Shock RN MPH Case Manager [phone]

## 2012-03-28 NOTE — Progress Notes (Signed)
Patient ID: [name], female   DOB: June 30, 1926, 78 y.o.   MRN: [id number] PGY-1 Daily Progress Note Family Medicine Teaching Service [name] M. Leslie Sinha, MD Service Pager: [phone]  Subjective: Patient had no acute events overnight. Care taker at bedside, no questions at this time. Patient continues to report pain.   Objective: Vital signs in last 24 hours: Temp:  [98.3 F (36.8 C)-98.7 F (37.1 C)] 98.3 F (36.8 C) (04/16 2145) Pulse Rate:  [57-61] 59  (04/16 2145) Resp:  [18] 18  (04/16 2145) BP: (129-151)/(63-78) 141/69 mmHg (04/16 2145) SpO2:  [92 %-97 %] 97 % (04/16 2145) Weight:  [111 lb 8 oz (50.576 kg)] 111 lb 8 oz (50.576 kg) (04/16 2145) Weight change: 3 lb 7.6 oz (1.576 kg) Last BM Date: 03/27/12  Intake/Output from previous day: 04/16 0701 - 04/17 0700 In: 720 [P.O.:720] Out: 1101 [Urine:1100; Stool:1] Intake/Output this shift:   Physical Exam: Constitutional: She appears well-developed and well-nourished. She appears comfortable lying in bed until anything is moved HENT:  Normocephalic. Chin with visible bruising, nontender. Cardiovascular: Normal rate and regular rhythm.  Respiratory: Effort normal and breath sounds normal.  GI: Soft.  Musculoskeletal: Patient lying still in bed. Does not move hip or knee. Elevated on pillow and slightly bent. Pain with any manipulation of lower extremity or bumping bed Neurological: She is alert. Answers questions appropriately.  Skin: Skin is warm and dry.  Psychiatric: She has a normal mood and affect.   Lab Results:  [facility] 03/28/12 0717 03/27/12 0615  WBC 5.8 7.1  HGB 10.1* 9.9*  HCT 31.6* 30.8*  PLT 187 179   BMET  Basename 03/28/12 0717 03/27/12 0615  NA 139 140  K 4.0 3.8  CL 107 106  CO2 24 24  GLUCOSE 104* 115*  BUN 29* 38*  CREATININE 1.53* 1.60*  CALCIUM 8.4 8.3*    Studies/Results: No results found.  Medications:  I have reviewed the patient's current medications. Scheduled:    .  acetaminophen  500 mg Oral Q4H  . antiseptic oral rinse  15 mL Mouth Rinse BID  . calcitRIOL  0.25 mcg Oral Daily  . donepezil  10 mg Oral QPC breakfast  . enoxaparin (LOVENOX) injection  30 mg Subcutaneous Q24H  . memantine  10 mg Oral BID  . sodium bicarbonate  650 mg Oral QID  . traMADol  50 mg Oral Q12H  . vitamin B-12  1,000 mcg Oral Q M,W,F   Continuous:   JYN:[id number]-[id number], ondansetron (ZOFRAN) IV, ondansetron, polyethylene glycol  Assessment/Plan: 78 yo F with dementia presenting with left hip fracture s/p fall   # Left hip fracture- Seen on Xray and CT scan. Ortho has been consulted. Patient and her family have decided to not have surgery at this time. - Ortho recommends nonweightbearing, Lovenox daily for at least 30 days, CSW consult, CM consult, PC consult, PT/OT. Patient and family are aware of these consults and agree at this time. - Pain control with scheduled Tylenol 500mg  q4 hours. On calcitriol at home. Lortab prn. Added Tramadol q12 hours yesterday, which may be helping. - Continue Lovenox (adjusted for creat clearance)  - We appreciate Dr. Shelba Monroe consult and excellent care of this patient - From discussion with the Family Medicine team, patient does not seem competent to make her own decisions. She does not remember recent events including discussions about surgery. We have spoken with the son, and PT has as well, and family needs more time to make final decisions. Encouraged  them to make decision by this afternoon so we can go forward with discharge, if that is what they desire.  #S/P fall- Patient denies any symptoms surrounding fall. Denies LOC.  - Monitored on telemetry, but this was d/c'd. No syncope workup at this time. - Patient had impact to chin as well. Xrays negative.  # Dementia- Followed by neurology, on medications at home. Stable at this time, and has been improving in recent months per son's report  - Continue Namenda and Aricept   #  CKD- Followed as an outpatient by nephrology. Creat 1.76 at admission, improved to 1.6 today.  - Received IVF, will KVO for now since she is taking good PO. - Hold home Lasix for now  - Avoid nephrotoxic agents  - History of anemia secondary to CKD. HgB stable today. Followed by nephro as an outpatient and will continue to be followed.  # FEN/GI- Regular diet. NS @ to [facility]  # PPx- Lovenox (adjusted for creat clearance)   # Dispo- Pending family's decision about operative course vs. Home. I am now under the impression they are considering surgery. Will follow up with family this afternoon for final decision.  Lives with son, [name] [phone].  Code status: Full   LOS: 3 days   [name] 03/28/2012, 10:28 AM

## 2012-03-28 NOTE — Progress Notes (Signed)
Physical Therapy Treatment Patient Details Name: [name] MRN: [id number] DOB: 1925/12/25 Today's Date: 03/28/2012  PT Assessment/Plan  PT - Assessment/Plan Comments on Treatment Session:  Son present for session, and witnessed the extreme amount of pain with mobility; Also reinforced with son and caregiver the need to mobilize pt in hopes of preventing secondary musculoskeletal and skin breakdown complications pt is at higher risk of developing without surgical fixation;  Having witnessed this, [name] seems to have decided to proceed with surgery; Discussed at length with [name] and with [name], Case Mgr (see also her note);  Discussed today's session with Drs. Hairford and Chambliss;  If plans for surgical fixation proceed, will plan to hold further PT until postop, and will need PT reorder with WBing status and any precautions necessary for Hip;  If still family decides to take non-operative approach, please see the PT note from 4/16 for equipment and services recommended PT Plan: Discharge plan needs to be updated (Will depend on decision re: surgery; Worth considering some form of post-acute rehab) PT Frequency:  (If for surgery, will hold PT until postop) Follow Up Recommendations: Home health PT;Supervision/Assistance - 24 hour (Will also depend on possible surgery, and status/progress postop) Equipment Recommended:  (See note of 4/16) PT Goals  Acute Rehab PT Goals Pt will Roll Supine to Right Side: with min assist;with rail PT Goal: Rolling Supine to Right Side - Progress: Not progressing Pt will go Supine/Side to Sit: with mod assist;with rail PT Goal: Supine/Side to Sit - Progress: Not progressing Pt will go Sit to Stand: with mod assist PT Goal: Sit to Stand - Progress: Not progressing Pt will go Stand to Sit: with mod assist PT Goal: Stand to Sit - Progress: Not progressing Pt will Transfer Bed to Chair/Chair to Bed: with mod assist PT Transfer Goal: Bed to Chair/Chair to Bed -  Progress: Progressing toward goal  PT Treatment Precautions/Restrictions  Precautions Precautions: Fall Precaution Comments: Very painful with any motion Restrictions Weight Bearing Restrictions: Yes LLE Weight Bearing: Non weight bearing Mobility (including Balance) Bed Mobility Rolling Right: 1: +2 Total assist;Patient percentage (comment) (pt=25%) Rolling Right Details (indicate cue type and reason): Extremely painful, pt requesting to stop; cues to hold handrail Supine to Sit: 1: +2 Total assist;Patient percentage (comment);HOB flat;With rails (pt<10%) Supine to Sit Details (indicate cue type and reason): Cues to use rails to push up; continued extreme pain Transfers Sit to Stand: 1: +2 Total assist;Patient percentage (comment);From bed (pt=25%) Sit to Stand Details (indicate cue type and reason): Max cues for NWB LLE; more painful today Stand to Sit: 1: +2 Total assist;Patient percentage (comment) (pt =30%) Stand to Sit Details: cues for use of armests to control descent       End of Session PT - End of Session Equipment Utilized During Treatment: Gait belt Activity Tolerance: Patient limited by pain Patient left: in chair;with call bell in reach;with family/visitor present Nurse Communication: Mobility status for transfers (Rec for drawsheet for back to bed) General Behavior During Session: [facility] for tasks performed Cognition: Impaired, at baseline  Van Clines [facility] [place], [place] [phone]  03/28/2012, 4:01 PM

## 2012-03-28 NOTE — Progress Notes (Signed)
I received a call from [place], her son, who indicated that she can not tolerate the pain and has elected for surgical management.    The risks benefits and alternatives were discussed with the patient including but not limited to the risks of nonoperative treatment, versus surgical intervention including infection, bleeding, nerve injury, periprosthetic fracture, the need for revision surgery, dislocation, leg length discrepancy, blood clots, cardiopulmonary complications, morbidity, mortality, among others, and they were willing to proceed.    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

## 2012-03-28 NOTE — Progress Notes (Signed)
Patient AV:WUJW [name]      DOB: 01-17-1926      JXB:[id number]  Reviewed case with my partner.  Family has set their goals .  We will sign off.  Please reconsult if family open to Palliative Care assistance.  Geraldina Parrott L. Ladona Ridgel, MD MBA The Palliative Medicine Team at [facility] Phone: [phone] Pager: [phone]

## 2012-03-29 ENCOUNTER — Encounter (HOSPITAL_COMMUNITY): Payer: Self-pay | Admitting: Anesthesiology

## 2012-03-29 ENCOUNTER — Inpatient Hospital Stay (HOSPITAL_COMMUNITY): Payer: Medicare Other

## 2012-03-29 ENCOUNTER — Inpatient Hospital Stay (HOSPITAL_COMMUNITY): Payer: Medicare Other | Admitting: Anesthesiology

## 2012-03-29 ENCOUNTER — Encounter (HOSPITAL_COMMUNITY)
Admission: EM | Disposition: A | Discharge status: Skilled Nursing Facility | Payer: Self-pay | Source: Home / Self Care | Attending: Family Medicine

## 2012-03-29 HISTORY — PX: TOTAL HIP ARTHROPLASTY: SHX124

## 2012-03-29 LAB — CBC
MCH: 31.6 pg (ref 26.0–34.0)
MCHC: 32.1 g/dL (ref 30.0–36.0)
MCV: 98.5 fL (ref 78.0–100.0)
Platelets: 216 10*3/uL (ref 150–400)
RDW: 13.8 % (ref 11.5–15.5)

## 2012-03-29 LAB — BASIC METABOLIC PANEL
BUN: 31 mg/dL — ABNORMAL HIGH (ref 6–23)
Calcium: 8.7 mg/dL (ref 8.4–10.5)
Creatinine, Ser: 1.38 mg/dL — ABNORMAL HIGH (ref 0.50–1.10)
GFR calc Af Amer: 40 mL/min — ABNORMAL LOW (ref >90)

## 2012-03-29 LAB — SURGICAL PCR SCREEN: MRSA, PCR: NEGATIVE

## 2012-03-29 LAB — GLUCOSE, CAPILLARY

## 2012-03-29 SURGERY — ARTHROPLASTY, HIP, TOTAL,POSTERIOR APPROACH
Anesthesia: General | Site: Hip | Laterality: Left | Wound class: Clean

## 2012-03-29 MED ORDER — MORPHINE SULFATE 2 MG/ML IJ SOLN
0.5000 mg | INTRAMUSCULAR | Status: DC | PRN
  Administered 2012-03-30 – 2012-03-31 (×4): 0.5 mg via INTRAVENOUS
  Filled 2012-03-29 (×4): qty 1

## 2012-03-29 MED ORDER — CEFAZOLIN SODIUM 1-5 GM-% IV SOLN
INTRAVENOUS | Status: AC
  Filled 2012-03-29: qty 50

## 2012-03-29 MED ORDER — LACTATED RINGERS IV SOLN: INTRAVENOUS | Status: DC

## 2012-03-29 MED ORDER — HYDROCODONE-ACETAMINOPHEN 5-325 MG PO TABS
1.0000 | ORAL_TABLET | ORAL | Status: DC | PRN
  Administered 2012-03-30 (×2): 1 via ORAL
  Administered 2012-03-30 – 2012-04-02 (×9): 2 via ORAL
  Filled 2012-03-29: qty 2
  Filled 2012-03-29: qty 1
  Filled 2012-03-29 (×3): qty 2
  Filled 2012-03-29: qty 1
  Filled 2012-03-29 (×5): qty 2

## 2012-03-29 MED ORDER — ENOXAPARIN SODIUM 40 MG/0.4ML ~~LOC~~ SOLN
30.0000 mg | SUBCUTANEOUS | Status: DC
  Administered 2012-03-30 – 2012-04-02 (×4): 30 mg via SUBCUTANEOUS
  Filled 2012-03-29 (×2): qty 0.4
  Filled 2012-03-29: qty 0.3
  Filled 2012-03-29: qty 0.4

## 2012-03-29 MED ORDER — ALUM & MAG HYDROXIDE-SIMETH 200-200-20 MG/5ML PO SUSP
30.0000 mL | ORAL | Status: DC | PRN
  Filled 2012-03-29: qty 30

## 2012-03-29 MED ORDER — DEXTROSE-NACL 5-0.45 % IV SOLN
INTRAVENOUS | Status: DC
  Administered 2012-03-29: 09:00:00 via INTRAVENOUS

## 2012-03-29 MED ORDER — METHOCARBAMOL 100 MG/ML IJ SOLN
500.0000 mg | Freq: Four times a day (QID) | INTRAVENOUS | Status: DC | PRN
  Filled 2012-03-29: qty 5

## 2012-03-29 MED ORDER — METOCLOPRAMIDE HCL 5 MG PO TABS
5.0000 mg | ORAL_TABLET | Freq: Three times a day (TID) | ORAL | Status: DC | PRN
  Filled 2012-03-29: qty 2

## 2012-03-29 MED ORDER — ONDANSETRON HCL 4 MG/2ML IJ SOLN: 4.0000 mg | Freq: Four times a day (QID) | INTRAMUSCULAR | Status: DC | PRN

## 2012-03-29 MED ORDER — HYDROMORPHONE HCL PF 1 MG/ML IJ SOLN
INTRAMUSCULAR | Status: AC
  Administered 2012-03-29: 0.5 mg via INTRAVENOUS
  Filled 2012-03-29: qty 1

## 2012-03-29 MED ORDER — MENTHOL 3 MG MT LOZG: 1.0000 | LOZENGE | OROMUCOSAL | Status: DC | PRN

## 2012-03-29 MED ORDER — FENTANYL CITRATE 0.05 MG/ML IJ SOLN: 25.0000 ug | INTRAMUSCULAR | Status: DC | PRN

## 2012-03-29 MED ORDER — METOCLOPRAMIDE HCL 5 MG/ML IJ SOLN
5.0000 mg | Freq: Three times a day (TID) | INTRAMUSCULAR | Status: DC | PRN
  Filled 2012-03-29: qty 2

## 2012-03-29 MED ORDER — BUPIVACAINE HCL (PF) 0.25 % IJ SOLN
INTRAMUSCULAR | Status: DC | PRN
  Administered 2012-03-29: 20 mL

## 2012-03-29 MED ORDER — DOCUSATE SODIUM 100 MG PO CAPS
100.0000 mg | ORAL_CAPSULE | Freq: Two times a day (BID) | ORAL | Status: DC
  Administered 2012-03-30 – 2012-04-02 (×7): 100 mg via ORAL
  Filled 2012-03-29 (×8): qty 1

## 2012-03-29 MED ORDER — ONDANSETRON HCL 4 MG PO TABS: 4.0000 mg | ORAL_TABLET | Freq: Four times a day (QID) | ORAL | Status: DC | PRN

## 2012-03-29 MED ORDER — ACETAMINOPHEN 650 MG RE SUPP: 650.0000 mg | Freq: Four times a day (QID) | RECTAL | Status: DC | PRN

## 2012-03-29 MED ORDER — CEFAZOLIN SODIUM 1-5 GM-% IV SOLN
1.0000 g | Freq: Four times a day (QID) | INTRAVENOUS | Status: AC
  Administered 2012-03-29 – 2012-03-30 (×3): 1 g via INTRAVENOUS
  Filled 2012-03-29 (×3): qty 50

## 2012-03-29 MED ORDER — ONDANSETRON HCL 4 MG/2ML IJ SOLN
INTRAMUSCULAR | Status: DC | PRN
  Administered 2012-03-29: 4 mg via INTRAVENOUS

## 2012-03-29 MED ORDER — ROCURONIUM BROMIDE 100 MG/10ML IV SOLN
INTRAVENOUS | Status: DC | PRN
  Administered 2012-03-29: 40 mg via INTRAVENOUS
  Administered 2012-03-29: 10 mg via INTRAVENOUS

## 2012-03-29 MED ORDER — LACTATED RINGERS IV SOLN
INTRAVENOUS | Status: DC | PRN
  Administered 2012-03-29 (×2): via INTRAVENOUS

## 2012-03-29 MED ORDER — HYDROMORPHONE HCL PF 1 MG/ML IJ SOLN
0.2500 mg | INTRAMUSCULAR | Status: DC | PRN
  Administered 2012-03-29 (×2): 0.5 mg via INTRAVENOUS
  Administered 2012-03-29 (×2): 0.25 mg via INTRAVENOUS
  Administered 2012-03-29: 0.5 mg via INTRAVENOUS

## 2012-03-29 MED ORDER — CEFAZOLIN SODIUM 1-5 GM-% IV SOLN
INTRAVENOUS | Status: DC | PRN
  Administered 2012-03-29: 1 g via INTRAVENOUS

## 2012-03-29 MED ORDER — SENNA 8.6 MG PO TABS
1.0000 | ORAL_TABLET | Freq: Two times a day (BID) | ORAL | Status: DC
  Administered 2012-03-30 – 2012-04-02 (×7): 8.6 mg via ORAL
  Filled 2012-03-29 (×8): qty 1

## 2012-03-29 MED ORDER — FENTANYL CITRATE 0.05 MG/ML IJ SOLN
INTRAMUSCULAR | Status: DC | PRN
  Administered 2012-03-29: 150 ug via INTRAVENOUS
  Administered 2012-03-29: 100 ug via INTRAVENOUS

## 2012-03-29 MED ORDER — ACETAMINOPHEN 325 MG PO TABS: 650.0000 mg | ORAL_TABLET | Freq: Four times a day (QID) | ORAL | Status: DC | PRN

## 2012-03-29 MED ORDER — POTASSIUM CHLORIDE IN NACL 20-0.45 MEQ/L-% IV SOLN
INTRAVENOUS | Status: DC
  Administered 2012-03-29 – 2012-03-31 (×2): via INTRAVENOUS
  Filled 2012-03-29 (×6): qty 1000

## 2012-03-29 MED ORDER — PROPOFOL 10 MG/ML IV EMUL
INTRAVENOUS | Status: DC | PRN
  Administered 2012-03-29: 100 mg via INTRAVENOUS

## 2012-03-29 MED ORDER — PHENOL 1.4 % MT LIQD: 1.0000 | OROMUCOSAL | Status: DC | PRN

## 2012-03-29 MED ORDER — METHOCARBAMOL 100 MG/ML IJ SOLN
500.0000 mg | INTRAVENOUS | Status: AC
  Administered 2012-03-29: 500 mg via INTRAVENOUS
  Filled 2012-03-29: qty 5

## 2012-03-29 MED ORDER — SODIUM CHLORIDE 0.9 % IR SOLN
Status: DC | PRN
  Administered 2012-03-29: 1000 mL

## 2012-03-29 MED ORDER — ZOLPIDEM TARTRATE 5 MG PO TABS: 5.0000 mg | ORAL_TABLET | Freq: Every evening | ORAL | Status: DC | PRN

## 2012-03-29 MED ORDER — METHOCARBAMOL 500 MG PO TABS
500.0000 mg | ORAL_TABLET | Freq: Four times a day (QID) | ORAL | Status: DC | PRN
  Administered 2012-04-01: 500 mg via ORAL
  Filled 2012-03-29: qty 1

## 2012-03-29 MED ORDER — DEXTROSE 5 % IV SOLN
INTRAVENOUS | Status: DC | PRN
  Administered 2012-03-29: 18:00:00 via INTRAVENOUS

## 2012-03-29 SURGICAL SUPPLY — 65 items
APL SKNCLS STERI-STRIP NONHPOA (GAUZE/BANDAGES/DRESSINGS) ×1
BENZOIN TINCTURE PRP APPL 2/3 (GAUZE/BANDAGES/DRESSINGS) ×2 IMPLANT
BLADE SAW SAG 73X25 THK (BLADE) ×1
BLADE SAW SGTL 73X25 THK (BLADE) ×1 IMPLANT
BRUSH FEMORAL CANAL (MISCELLANEOUS) IMPLANT
CLOTH BEACON ORANGE TIMEOUT ST (SAFETY) ×2 IMPLANT
COVER BACK TABLE 24X17X13 BIG (DRAPES) IMPLANT
COVER SURGICAL LIGHT HANDLE (MISCELLANEOUS) ×2 IMPLANT
DRAPE INCISE IOBAN 66X45 STRL (DRAPES) ×1 IMPLANT
DRAPE ORTHO SPLIT 77X108 STRL (DRAPES) ×4
DRAPE PROXIMA HALF (DRAPES) ×2 IMPLANT
DRAPE SURG ORHT 6 SPLT 77X108 (DRAPES) ×2 IMPLANT
DRAPE U-SHAPE 47X51 STRL (DRAPES) ×2 IMPLANT
DRILL BIT 5/64 (BIT) ×2 IMPLANT
DRSG MEPILEX BORDER 4X12 (GAUZE/BANDAGES/DRESSINGS) ×1 IMPLANT
DRSG MEPILEX BORDER 4X8 (GAUZE/BANDAGES/DRESSINGS) IMPLANT
DRSG PAD ABDOMINAL 8X10 ST (GAUZE/BANDAGES/DRESSINGS) ×2 IMPLANT
DURAPREP 26ML APPLICATOR (WOUND CARE) ×2 IMPLANT
ELECT CAUTERY BLADE 6.4 (BLADE) ×2 IMPLANT
ELECT REM PT RETURN 9FT ADLT (ELECTROSURGICAL) ×2
ELECTRODE REM PT RTRN 9FT ADLT (ELECTROSURGICAL) ×1 IMPLANT
EVACUATOR 1/8 PVC DRAIN (DRAIN) IMPLANT
GLOVE BIO SURGEON STRL SZ 6.5 (GLOVE) ×1 IMPLANT
GLOVE BIOGEL PI IND STRL 8 (GLOVE) ×1 IMPLANT
GLOVE BIOGEL PI INDICATOR 8 (GLOVE) ×1
GLOVE ECLIPSE 7.0 STRL STRAW (GLOVE) ×1 IMPLANT
GLOVE ECLIPSE 7.5 STRL STRAW (GLOVE) ×1 IMPLANT
GLOVE ORTHO TXT STRL SZ7.5 (GLOVE) ×2 IMPLANT
GLOVE SURG ORTHO 8.0 STRL STRW (GLOVE) ×4 IMPLANT
GOWN EXTRA PROTECTION XL (GOWNS) ×1 IMPLANT
GOWN STRL NON-REIN LRG LVL3 (GOWN DISPOSABLE) IMPLANT
HANDPIECE INTERPULSE COAX TIP (DISPOSABLE)
HOOD PEEL AWAY FACE SHEILD DIS (HOOD) ×5 IMPLANT
KIT BASIN OR (CUSTOM PROCEDURE TRAY) ×2 IMPLANT
KIT ROOM TURNOVER OR (KITS) ×2 IMPLANT
MANIFOLD NEPTUNE II (INSTRUMENTS) ×2 IMPLANT
NDL HYPO 25GX1X1/2 BEV (NEEDLE) ×1 IMPLANT
NEEDLE HYPO 25GX1X1/2 BEV (NEEDLE) ×2 IMPLANT
NS IRRIG 1000ML POUR BTL (IV SOLUTION) ×2 IMPLANT
PACK TOTAL JOINT (CUSTOM PROCEDURE TRAY) ×2 IMPLANT
PAD ARMBOARD 7.5X6 YLW CONV (MISCELLANEOUS) ×4 IMPLANT
PILLOW ABDUCTION HIP (SOFTGOODS) ×2 IMPLANT
PRESSURIZER FEMORAL UNIV (MISCELLANEOUS) IMPLANT
RETRIEVER SUT HEWSON (MISCELLANEOUS) ×2 IMPLANT
SET HNDPC FAN SPRY TIP SCT (DISPOSABLE) IMPLANT
SPONGE GAUZE 4X4 12PLY (GAUZE/BANDAGES/DRESSINGS) ×1 IMPLANT
SPONGE LAP 4X18 X RAY DECT (DISPOSABLE) ×1 IMPLANT
STRIP CLOSURE SKIN 1/2X4 (GAUZE/BANDAGES/DRESSINGS) ×4 IMPLANT
SUCTION FRAZIER TIP 10 FR DISP (SUCTIONS) ×2 IMPLANT
SUT FIBERWIRE #2 38 REV NDL BL (SUTURE) ×4
SUT FIBERWIRE #2 38 T-5 BLUE (SUTURE) ×2
SUT MNCRL AB 4-0 PS2 18 (SUTURE) ×1 IMPLANT
SUT VIC AB 0 CT1 27 (SUTURE) ×2
SUT VIC AB 0 CT1 27XBRD ANBCTR (SUTURE) ×1 IMPLANT
SUT VIC AB 2-0 CT1 27 (SUTURE) ×2
SUT VIC AB 2-0 CT1 TAPERPNT 27 (SUTURE) ×1 IMPLANT
SUT VIC AB 3-0 SH 18 (SUTURE) ×2 IMPLANT
SUTURE FIBERWR #2 38 T-5 BLUE (SUTURE) IMPLANT
SUTURE FIBERWR#2 38 REV NDL BL (SUTURE) ×3 IMPLANT
SYR CONTROL 10ML LL (SYRINGE) ×2 IMPLANT
TOWEL OR 17X24 6PK STRL BLUE (TOWEL DISPOSABLE) ×2 IMPLANT
TOWEL OR 17X26 10 PK STRL BLUE (TOWEL DISPOSABLE) ×3 IMPLANT
TOWER CARTRIDGE SMART MIX (DISPOSABLE) IMPLANT
TRAY FOLEY CATH 14FR (SET/KITS/TRAYS/PACK) ×1 IMPLANT
WATER STERILE IRR 1000ML POUR (IV SOLUTION) ×6 IMPLANT

## 2012-03-29 NOTE — Progress Notes (Signed)
Family Medicine Teaching Service Attending Note  I interviewed and examined patient Goyal and reviewed their tests and x-rays.  I discussed with Dr. Mikel Cella and reviewed their note for today.  I agree with their assessment and plan.     Additionally  Seems to have decided on surgery Start IS to hopefully reduce post op pneumonia atelectasis

## 2012-03-29 NOTE — Telephone Encounter (Signed)
Called patient but no answer or answering machine.

## 2012-03-29 NOTE — Anesthesia Procedure Notes (Addendum)
Performed by: Carolyn Stare S   Procedure Name: Intubation Date/Time: 03/29/2012 6:16 PM Performed by: Nicholos Johns Pre-anesthesia Checklist: Patient identified, Emergency Drugs available, Suction available, Patient being monitored and Timeout performed Patient Re-evaluated:Patient Re-evaluated prior to inductionOxygen Delivery Method: Circle system utilized Intubation Type: IV induction Ventilation: Mask ventilation without difficulty Laryngoscope Size: Miller and 3 Grade View: Grade I Tube type: Oral Tube size: 7.0 mm Number of attempts: 1 Airway Equipment and Method: Stylet Placement Confirmation: ETT inserted through vocal cords under direct vision,  positive ETCO2 and breath sounds checked- equal and bilateral Secured at: 21 cm Tube secured with: Tape Dental Injury: Teeth and Oropharynx as per pre-operative assessment  Comments: By Vinnie Langton, CRNA

## 2012-03-29 NOTE — Progress Notes (Signed)
Patient ID: [name], female   DOB: Sep 25, 1926, 78 y.o.   MRN: [id number] PGY-1 Daily Progress Note Family Medicine Teaching Service Tora Prunty M. Alyda Megna, MD Service Pager: [phone]  Subjective: Patient had no acute events overnight. Family and patient have decided to go forward with surgery today. NPO at this time.  Objective: Vital signs in last 24 hours: Temp:  [97.7 F (36.5 C)-98.4 F (36.9 C)] 97.8 F (36.6 C) (04/18 0554) Pulse Rate:  [58-66] 66  (04/18 0554) Resp:  [18-20] 18  (04/18 0554) BP: (139-180)/(56-64) 180/64 mmHg (04/18 0554) SpO2:  [95 %-99 %] 97 % (04/18 0554) Weight change:  Last BM Date: 03/28/12  Intake/Output from previous day: 04/17 0701 - 04/18 0700 In: 240 [P.O.:240] Out: 625 [Urine:625] Intake/Output this shift:   Physical Exam: Constitutional: She appears well-developed and well-nourished. She appears comfortable lying in bed until anything is moved HENT:  Normocephalic. Chin with visible bruising, nontender. Cardiovascular: Normal rate and regular rhythm.  Respiratory: Effort normal and breath sounds normal.  GI: Soft.  Musculoskeletal: Patient lying still in bed. Does not move hip or knee. Elevated on pillow and slightly bent. Pain with any manipulation of lower extremity or bumping bed Neurological: She is alert. Answers questions appropriately.  Skin: Skin is warm and dry.  Psychiatric: She has a normal mood and affect.   Lab Results:  Basename 03/29/12 0525 03/28/12 0717  WBC 6.4 5.8  HGB 10.4* 10.1*  HCT 32.4* 31.6*  PLT 216 187   BMET  Basename 03/28/12 0717 03/27/12 0615  NA 139 140  K 4.0 3.8  CL 107 106  CO2 24 24  GLUCOSE 104* 115*  BUN 29* 38*  CREATININE 1.53* 1.60*  CALCIUM 8.4 8.3*    Studies/Results: No results found.  Medications:  I have reviewed the patient's current medications. Scheduled:    . acetaminophen  500 mg Oral Q4H  . antiseptic oral rinse  15 mL Mouth Rinse BID  . calcitRIOL  0.25 mcg Oral  Daily  . donepezil  10 mg Oral QPC breakfast  . memantine  10 mg Oral BID  . sodium bicarbonate  650 mg Oral QID  . vitamin B-12  1,000 mcg Oral Q M,W,F  . DISCONTD: enoxaparin (LOVENOX) injection  30 mg Subcutaneous Q24H  . DISCONTD: traMADol  50 mg Oral Q12H   Continuous:    . dextrose 5 % and 0.45% NaCl     XVQ:[id number], ondansetron (ZOFRAN) IV, ondansetron, polyethylene glycol  Assessment/Plan: 78 yo F with dementia presenting with left hip fracture s/p fall   # Left hip fracture- Seen on Xray and CT scan. Ortho has been consulted. Patient and her family have decided to not have surgery at this time. - Ortho recommends nonweightbearing, Lovenox daily for at least 30 days, CSW consult, CM consult, PC consult, PT/OT. Patient and family are aware of these consults and agree at this time. Family has decided to go forward with surgery. Patient listed for total hip replacement today. NPO at this time. - Pain control with scheduled Tylenol 500mg  q4 hours. On calcitriol at home. Lortab prn. Added Tramadol q12 hours yesterday, which may be helping. - Continue Lovenox (adjusted for creat clearance)  - We appreciate Dr. Shelba Flake consult and excellent care of this patient - From discussion with the Family Medicine team, patient does not seem competent to make her own decisions. She does not remember recent events including discussions about surgery. We have spoken with the son and decided to go  with operative course.  #S/P fall- Patient denies any symptoms surrounding fall. Denies LOC.  - Monitored on telemetry, but this was d/c'd. No syncope workup at this time. - Patient had impact to chin as well. Xrays negative.  # Dementia- Followed by neurology, on medications at home. Stable at this time, and has been improving in recent months per son's report  - Continue Namenda and Aricept   # CKD- Followed as an outpatient by nephrology. Creat 1.76 at admission, improved to 1.6  today.  - Received IVF, will KVO for now since she is taking good PO. - Hold home Lasix for now  - Avoid nephrotoxic agents  - History of anemia secondary to CKD. HgB stable today. Followed by nephro as an outpatient and will continue to be followed.  # FEN/GI-NPO. IVF to D51/2NS @100cc /hr # PPx- Lovenox (adjusted for creat clearance)   # Dispo- Surgery today. Will continue to follow up post-op.  Lives with son, Redith Drach [phone].  Code status: Full   LOS: 4 days   Assunta Pupo 03/29/2012, 8:48 AM

## 2012-03-29 NOTE — Op Note (Signed)
03/25/2012 - 03/29/2012  8:02 PM  PATIENT:  [name]   MRN: [id number]  PRE-OPERATIVE DIAGNOSIS:  left intertrochanteric hip fracture with avascular necrosis  POST-OPERATIVE DIAGNOSIS:  left Intertrachanteric hip fracture with avascular necrosis  PROCEDURE:  Procedure(s): Left hip hemiarthroplasty  PREOPERATIVE INDICATIONS:    [name] is an 78 y.o. female who has a diagnosis of Fracture, intertrochanteric, left femur and elected for surgical management after failing conservative treatment.  We in fact had her on palliative care for almost a week, and proved unsuccessful in managing her pain. She had goals of regaining ambulatory function, and both she and her family elected for surgical management. The risks benefits and alternatives were discussed with the patient and the durable power of attorney including but not limited to the risks of nonoperative treatment, versus surgical intervention including infection, bleeding, nerve injury, periprosthetic fracture, the need for revision surgery, dislocation, leg length discrepancy, blood clots, cardiopulmonary complications, morbidity, mortality, among others, and they were willing to proceed.     OPERATIVE REPORT     SURGEON:  Teryl Lucy, MD    ASSISTANT:  Janace Litten, OPA-C  (Present throughout the entire procedure,  necessary for completion of procedure in a timely manner, assisting with retraction, instrumentation, and closure)     ANESTHESIA:  General    COMPLICATIONS:  None.     COMPONENTS:  Depuy AML size 15 stem, small, with a 49 mm -3 hemiarthroplasty head ball.    PROCEDURE IN DETAIL:   The patient was met in the holding area and  identified.  The appropriate hip was identified and marked at the operative site.  The patient was then transported to the OR  and  placed under general anesthesia.  At that point, the patient was  placed in the lateral decubitus position with the operative side up and  secured to the  operating room table and all bony prominences padded.     The operative lower extremity was prepped from the iliac crest to the distal leg.  Sterile draping was performed.  Time out was performed prior to incision.      A routine posterolateral approach was utilized via sharp dissection  carried down to the subcutaneous tissue.  Gross bleeders were Bovie coagulated.  The iliotibial band was identified and incised along the length of the skin incision.  Self-retaining retractors were  inserted.  With the hip internally rotated, the short external rotators  were identified. The piriformis and capsule was tagged with FiberWire, and the hip capsule released in a T-type fashion. The fracture site was identified, and the femoral head was removed from the acetabulum.  The femoral head was extremely misshapen, and collapsed. The fracture extended down to the lesser trochanter. The greater trochanter was fractured, but overall intact, and remained intact throughout the case, although it was definitely weak.    I then exposed the deep acetabulum.  The acetabulum did have chondral changes, but overall still maintained and acetabular shape. I debated replacing the acetabulum with a prosthetic shell, however I was concerned because of her dementia, as well as her low demand, that this would increase operative morbidity, and risks, and increased risk for dislocation, and so therefore abandoned the placement of an acetabular shell, and instead converted to a hemiarthroplasty.    I then prepared the proximal femur using the cookie-cutter, the lateralizing reamer, and then sequentially reamed and broached.  I sequentially reamed after using the canal finder, and had a good fit  with a 15. I trialed, with the appropriate version, and the -3 head appropriate restoration of soft tissue tension and excellent stability. Therefore I placed the real implant, and trialed once more with the -3. It again fit quite well. I also did  trial with the femoral head sizing guides in the acetabulum in order to confirm appropriate size of the head, as it was impossible to do with the native head due to deformity.   I based the height off of my estimation of the prosthesis at the appropriate level above the junction of the lesser trochanter. I also base this off of the relationship of the greater trochanter to the center of the femoral head where the tip of the neck ended.  I then used a 2 mm drill bits to pass the FiberWire suture from the capsule and puriform is through the greater trochanter, and secured this. Excellent posterior capsular repair was achieved. I also closed the T in the capsule.  I then irrigated the hip copiously again with pulse lavage, and repaired the fascia with Vicryl, followed by Vicryl for the subcutaneous tissue, Monocryl for the skin, Steri-Strips and sterile gauze. The wounds were injected. The patient was then awakened and returned to PACU in stable and satisfactory condition. No complications.  Teryl Lucy, MD Orthopedic Surgeon [phone]   03/29/2012 8:02 PM

## 2012-03-29 NOTE — Progress Notes (Signed)
Subjective:  Patient reports pain as severe.  She and her son have elected for surgery.  Objective:   VITALS:   Filed Vitals:   03/28/12 2130 03/29/12 0554 03/29/12 1000 03/29/12 1400  BP: 158/56 180/64 178/70 148/61  Pulse: 59 66 71 65  Temp: 97.7 F (36.5 C) 97.8 F (36.6 C) 97.6 F (36.4 C) 97.8 F (36.6 C)  TempSrc: Oral Oral Oral Oral  Resp: 18 18 20 18   Height:      Weight:      SpO2: 96% 97% 97% 96%    Neurologically intact Dorsiflexion/Plantar flexion intact  LABS  Results for orders placed during the hospital encounter of 03/25/12 (from the past 24 hour(s))  PREPARE RBC (CROSSMATCH)     Status: Normal   Collection Time   03/28/12  9:40 PM      Component Value Range   Order Confirmation ORDER PROCESSED BY BLOOD BANK    TYPE AND SCREEN     Status: Normal (Preliminary result)   Collection Time   03/28/12  9:40 PM      Component Value Range   ABO/RH(D) B POS     Antibody Screen NEG     Sample Expiration 03/31/2012     Unit Number [id number]     Blood Component Type RED CELLS,LR     Unit division 00     Status of Unit ALLOCATED     Transfusion Status OK TO TRANSFUSE     Crossmatch Result Compatible     Unit Number [id number]     Blood Component Type RED CELLS,LR     Unit division 00     Status of Unit ALLOCATED     Transfusion Status OK TO TRANSFUSE     Crossmatch Result Compatible    SURGICAL PCR SCREEN     Status: Normal   Collection Time   03/29/12  1:05 AM      Component Value Range   MRSA, PCR NEGATIVE  NEGATIVE    Staphylococcus aureus NEGATIVE  NEGATIVE   CBC     Status: Abnormal   Collection Time   03/29/12  5:25 AM      Component Value Range   WBC 6.4  4.0 - 10.5 (K/uL)   RBC 3.29 (*) 3.87 - 5.11 (MIL/uL)   Hemoglobin 10.4 (*) 12.0 - 15.0 (g/dL)   HCT 57.8 (*) 46.9 - 46.0 (%)   MCV 98.5  78.0 - 100.0 (fL)   MCH 31.6  26.0 - 34.0 (pg)   MCHC 32.1  30.0 - 36.0 (g/dL)   RDW 62.9  52.8 - 41.3 (%)   Platelets 216  150 - 400 (K/uL)    BASIC METABOLIC PANEL     Status: Abnormal   Collection Time   03/29/12  5:25 AM      Component Value Range   Sodium 140  135 - 145 (mEq/L)   Potassium 4.0  3.5 - 5.1 (mEq/L)   Chloride 104  96 - 112 (mEq/L)   CO2 23  19 - 32 (mEq/L)   Glucose, Bld 105 (*) 70 - 99 (mg/dL)   BUN 31 (*) 6 - 23 (mg/dL)   Creatinine, Ser 2.44 (*) 0.50 - 1.10 (mg/dL)   Calcium 8.7  8.4 - 01.0 (mg/dL)   GFR calc non Af Amer 34 (*) >90 (mL/min)   GFR calc Af Amer 40 (*) >90 (mL/min)  GLUCOSE, CAPILLARY     Status: Abnormal   Collection Time   03/29/12  7:52 AM      Component Value Range   Glucose-Capillary 102 (*) 70 - 99 (mg/dL)   Comment 1 Documented in Chart     Comment 2 Notify RN      No results found.  Assessment/Plan:   Principal Problem:  *Fracture, intertrochanteric, left femur Active Problems:  Fall from standing  Dementia advanced left avascular necrosis.  Plan for THA.  The risks benefits and alternatives were discussed with the patient including but not limited to the risks of nonoperative treatment, versus surgical intervention including infection, bleeding, nerve injury, periprosthetic fracture, the need for revision surgery, dislocation, leg length discrepancy, blood clots, cardiopulmonary complications, morbidity, mortality, among others, and they were willing to proceed.  Predicted outcome is good, although there will be at least a six to nine month expected recovery.    Eaton Folmar P 03/29/2012, 5:58 PM   Teryl Lucy, MD [phone] pager

## 2012-03-29 NOTE — Preoperative (Signed)
Beta Blockers   Reason not to administer Beta Blockers:Not Applicable 

## 2012-03-29 NOTE — Progress Notes (Signed)
PT Cancellation Note  Treatment cancelled today due to noted plan for pt to OR later today for THA.  Will hold PT today and will need new order with WBing and THA Precautions to resume PT after surgery.  Thanks.    Sunny Schlein, [place] [phone] 03/29/2012, 10:05 AM

## 2012-03-29 NOTE — Anesthesia Preprocedure Evaluation (Addendum)
Anesthesia Evaluation  Patient identified by MRN, date of birth, ID band Patient confused    Reviewed: Allergy & Precautions, H&P , NPO status , Patient's Chart, lab work & pertinent test results  Airway Mallampati: II TM Distance: >3 FB Neck ROM: Full    Dental  (+) Teeth Intact and Dental Advisory Given   Pulmonary neg pulmonary ROS,          Cardiovascular hypertension, Pt. on medications     Neuro/Psych PSYCHIATRIC DISORDERS    GI/Hepatic negative GI ROS, Neg liver ROS,   Endo/Other  negative endocrine ROS  Renal/GU   negative genitourinary   Musculoskeletal  (+) Arthritis -, Osteoarthritis,    Abdominal   Peds  Hematology negative hematology ROS (+)   Anesthesia Other Findings   Reproductive/Obstetrics negative OB ROS                          Anesthesia Physical Anesthesia Plan  ASA: III  Anesthesia Plan: General   Post-op Pain Management:    Induction: Intravenous  Airway Management Planned: Oral ETT  Additional Equipment:   Intra-op Plan:   Post-operative Plan: Extubation in OR  Informed Consent: I have reviewed the patients History and Physical, chart, labs and discussed the procedure including the risks, benefits and alternatives for the proposed anesthesia with the patient or authorized representative who has indicated his/her understanding and acceptance.   Dental advisory given  Plan Discussed with: CRNA  Anesthesia Plan Comments: (Pt with dementia - interviewed pt and chart reviews for information)        Anesthesia Quick Evaluation

## 2012-03-29 NOTE — Transfer of Care (Signed)
Immediate Anesthesia Transfer of Care Note  Patient: [name]  Procedure(s) Performed: Procedure(s) (LRB): TOTAL HIP ARTHROPLASTY (Left)  Patient Location: PACU  Anesthesia Type: General  Level of Consciousness: awake and confused  Airway & Oxygen Therapy: Patient Spontanous Breathing and Patient connected to nasal cannula oxygen  Post-op Assessment: Report given to PACU RN and Post -op Vital signs reviewed and stable  Post vital signs: Reviewed and stable  Complications: No apparent anesthesia complications

## 2012-03-29 NOTE — Anesthesia Postprocedure Evaluation (Signed)
  Anesthesia Post-op Note  Patient: [name]  Procedure(s) Performed: Procedure(s) (LRB): TOTAL HIP ARTHROPLASTY (Left)  Patient Location: PACU  Anesthesia Type: General  Level of Consciousness: awake and alert   Airway and Oxygen Therapy: Patient Spontanous Breathing and Patient connected to nasal cannula oxygen  Post-op Pain: mild  Post-op Assessment: Post-op Vital signs reviewed, Patient's Cardiovascular Status Stable, Respiratory Function Stable, Patent Airway, No signs of Nausea or vomiting and Pain level controlled  Post-op Vital Signs: Reviewed and stable  Complications: No apparent anesthesia complications

## 2012-03-29 NOTE — Progress Notes (Signed)
Called OR to check when pt would go down for sx. Aram Beecham in [place] said pt would go for sx after 5 pm. Dr. Lorretta Harp is working at another location and will not be available until 5 pm or after. Family and pt informed. [place], Rosanna Randy

## 2012-03-30 LAB — BASIC METABOLIC PANEL
Calcium: 8.5 mg/dL (ref 8.4–10.5)
GFR calc non Af Amer: 34 mL/min — ABNORMAL LOW (ref >90)
Glucose, Bld: 135 mg/dL — ABNORMAL HIGH (ref 70–99)
Sodium: 137 mEq/L (ref 135–145)

## 2012-03-30 LAB — CBC
MCH: 31.4 pg (ref 26.0–34.0)
MCHC: 32.3 g/dL (ref 30.0–36.0)
Platelets: 209 10*3/uL (ref 150–400)

## 2012-03-30 NOTE — Progress Notes (Signed)
Subjective:  Patient reports pain as moderate.  Slept well overnight.  Objective:   VITALS:   Filed Vitals:   03/29/12 2332 03/30/12 0100 03/30/12 0353 03/30/12 0529  BP:  172/76  146/74  Pulse:  76  86  Temp:    97.9 F (36.6 C)  TempSrc:    Oral  Resp: 15 15 16 17   Height:      Weight:      SpO2: 99% 96% 96% 95%    Neurologically intact ABD soft Dorsiflexion/Plantar flexion intact Incision: dressing C/D/I  LABS  Results for orders placed during the hospital encounter of 03/25/12 (from the past 24 hour(s))  GLUCOSE, CAPILLARY     Status: Abnormal   Collection Time   03/29/12  7:52 AM      Component Value Range   Glucose-Capillary 102 (*) 70 - 99 (mg/dL)   Comment 1 Documented in Chart     Comment 2 Notify RN    CBC     Status: Abnormal   Collection Time   03/30/12  5:20 AM      Component Value Range   WBC 8.8  4.0 - 10.5 (K/uL)   RBC 2.93 (*) 3.87 - 5.11 (MIL/uL)   Hemoglobin 9.2 (*) 12.0 - 15.0 (g/dL)   HCT 16.1 (*) 09.6 - 46.0 (%)   MCV 97.3  78.0 - 100.0 (fL)   MCH 31.4  26.0 - 34.0 (pg)   MCHC 32.3  30.0 - 36.0 (g/dL)   RDW 04.5  40.9 - 81.1 (%)   Platelets 209  150 - 400 (K/uL)  BASIC METABOLIC PANEL     Status: Abnormal   Collection Time   03/30/12  5:20 AM      Component Value Range   Sodium 137  135 - 145 (mEq/L)   Potassium 4.6  3.5 - 5.1 (mEq/L)   Chloride 104  96 - 112 (mEq/L)   CO2 24  19 - 32 (mEq/L)   Glucose, Bld 135 (*) 70 - 99 (mg/dL)   BUN 26 (*) 6 - 23 (mg/dL)   Creatinine, Ser 9.14 (*) 0.50 - 1.10 (mg/dL)   Calcium 8.5  8.4 - 78.2 (mg/dL)   GFR calc non Af Amer 34 (*) >90 (mL/min)   GFR calc Af Amer 40 (*) >90 (mL/min)    Dg Pelvis Portable  03/29/2012  *RADIOLOGY REPORT*  Clinical Data: Status post left hip arthroplasty.  PORTABLE PELVIS  Comparison: CT of the pelvis 03/25/2012.  Findings: The patient is status post left hip hemiarthroplasty. The prosthesis is located.  Sclerotic changes are noted in the acetabulum.  The  femoral component is well seated.  The patient is status post ORIF of the right hip.  A proximal femoral nail is evident.  Degenerative changes in the right hip are less severe than on the left.  Vertebral augmentation is evident at L5.  The SI joints are fused bilaterally.  IMPRESSION:  1.  Status post left hip hemiarthroplasty without radiographic evidence for complication.  Original Report Authenticated By: Jamesetta Orleans. MATTERN, M.D.   Dg Hip Portable 1 View Left  03/29/2012  *RADIOLOGY REPORT*  Clinical Data: Status post left hip arthroplasty.  PORTABLE LEFT HIP - 1 VIEW  Comparison: Pelvis CT 03/25/2012.  Findings: A cross-table lateral view confirms that the left hip is located.  The femoral component is well seated.  IMPRESSION: Status post left hip hemiarthroplasty without radiographic evidence for complication.  Original Report Authenticated By: Jamesetta Orleans. MATTERN,  M.D.    Assessment/Plan: 1 Day Post-Op   Principal Problem:  *Fracture, intertrochanteric, left femur Active Problems:  Fall from standing  Dementia   Advance diet Up with therapy weightbearing as tolerated. SNIF versus home, based on family preferences and resources.   She seems to be doing well.  Acute blood loss anemia, observe.   Suda Forbess P 03/30/2012, 7:03 AM   Teryl Lucy, MD [phone] pager

## 2012-03-30 NOTE — Progress Notes (Signed)
Clinical Child psychotherapist (CSW) met with pt son [name] to discuss dc planning. [name] is agreeable to ST rehab and has given consent for CSW to fax pt information out to SNF in [facility].  A complete psychosocial assessment to follow.  Theresia Bough, MSW, Theresia Majors [phone]

## 2012-03-30 NOTE — Progress Notes (Signed)
Clinical Social Worker (CSW) spoke with pt son Molly Maduro over the phone and explained her role. Son stated he needed to eat and "get his things together" before he could talk to CSW. Son requested that CSW call him back at 2pm. CSW made son aware that if pt would return home she could potentially return tomorrow and if placement is desired than a dc could possibly happen early next week. Son stated he understood. CSW will follow up with son at 2pm.  Theresia Bough, MSW, [employer] [phone]

## 2012-03-30 NOTE — Progress Notes (Signed)
PT RE-EVALUATION  03/30/12 0906  PT Visit Information  Last PT Received On 03/30/12  PT Time Calculation  PT Start Time 0857  PT Stop Time 0935  PT Time Calculation (min) 38 min  Subjective Data  Patient Stated Goal walk independently  Precautions  Precautions Posterior Hip;Fall  Restrictions  Weight Bearing Restrictions Yes  LLE Weight Bearing WBAT  Home Living  Lives With Son  Available Help at Discharge Family  Type of Home House  Home Access Stairs to enter;Ramped entrance;Other (comment) (ramp built 03/29/12)  Home Layout One level  Bathroom Shower/Tub Walk-in shower;Door  Horticulturist, commercial Yes  How Accessible Accessible via walker  Home Adaptive Equipment None  Prior Function  Level of Independence Independent  Driving No  Vocation Retired  Geneticist, molecular No difficulties  Cognition  Overall Cognitive Status History of cognitive impairments - at baseline  Arousal/Alertness Awake/alert  Behavior During Session [facility] for tasks performed  Cognition - Other Comments Able to follow directions and cooperate with requests  Sensation  Light Touch Appears Intact  Coordination  Gross Motor Movements are Fluid and Coordinated Yes  Fine Motor Movements are Fluid and Coordinated Not tested  Right Upper Extremity Assessment  RUE ROM/Strength/Tone WFL for tasks assessed  RUE Sensation WFL - Light Touch  Left Upper Extremity Assessment  LUE ROM/Strength/Tone WFL for tasks assessed  LUE Sensation WFL - Light Touch  Right Lower Extremity Assessment  RLE ROM/Strength/Tone WFL  RLE Sensation WFL - Light Touch  Left Lower Extremity Assessment  LLE ROM/Strength/Tone Unable to fully assess;Due to pain;Due to precautions (s/p hip hemiarthroplasty)  LLE Sensation WFL - Light Touch  Bed Mobility  Bed Mobility Supine to Sit  Supine to Sit 1: +2 Total assist;Other (comment);HOB elevated (. 40%)  Supine to Sit: Patient Percentage 40%  Sit  to Supine Not Tested (comment);Other (comment) (pt. remained in recliner)  Details for Bed Mobility Assistance pt. needed cueing for technique and safety  Transfers  Transfers Sit to Stand;Stand to Sit  Sit to Stand 1: +2 Total assist;With upper extremity assist;From bed  Sit to Stand: Patient Percentage 50%  Stand to Sit 1: +2 Total assist;With armrests;To chair/3-in-1  Stand to Sit: Patient Percentage 50%  Details for Transfer Assistance cues for safe technique and hand placement  Ambulation/Gait  Ambulation/Gait Assistance 1: +2 Total assist  Ambulation/Gait: Patient Percentage 60  Ambulation Distance (Feet) 2 Feet  Assistive device Rolling walker  Ambulation/Gait Assistance Details pt. tends to stand too far behind RW; needed postural cues and assist to move left LE forward  Gait Pattern Step-to pattern;Decreased step length - right;Decreased step length - left;Decreased hip/knee flexion - left;Antalgic  Gait velocity decreased  Ambulation/Gait Yes  Balance  Balance Assessed Yes  Static Sitting Balance  Static Sitting - Balance Support Bilateral upper extremity supported;Feet supported  Static Sitting - Level of Assistance 4: Min assist  Static Sitting - Comment/# of Minutes 5  RUE Assessment  RUE Assessment WFL  LUE Assessment  LUE Assessment WFL  RLE Assessment  RLE Assessment WFL  LLE Assessment  LLE Assessment X  Cervical Assessment  Cervical Assessment WFL  Thoracic Assessment  Thoracic Assessment WFL  Lumbar Assessment  Lumbar Assessment [facility]  Exercises  Exercises Total Joint  Total Joint Exercises  Ankle Circles/Pumps AROM;Both;10 reps;Supine  Quad Sets AROM;Right;5 reps;Supine  PT - End of Session  Equipment Utilized During Treatment Gait belt  Activity Tolerance Patient limited by fatigue;Patient limited by pain  Patient left with call bell/phone within reach;in chair;with family/visitor present  Nurse Communication Mobility status;Precautions;Weight  bearing status  PT Assessment  Clinical Impression Statement Pt. is now s/p left hip hemiarthroplasty after a period of medical management for hip f x.  Pt. continues to have pain but it seems as though it is a more managable pain than what sehe was having pre-operatively.  Believe that pt. will need SNF for rehab before return home with her son.  She needs acute PT to address below problems and to maximize functional mobility for  ease of burden of care at next [place].Marland Kitchen  PT Recommendation/Assessment Patient needs continued PT services  PT Problem List Decreased strength;Decreased range of motion;Decreased activity tolerance;Decreased balance;Decreased mobility;Decreased cognition;Decreased knowledge of use of DME;Decreased safety awareness;Decreased knowledge of precautions;Pain  Barriers to Discharge Decreased caregiver support  PT Therapy Diagnosis  Acute pain;Difficulty walking  PT Plan  PT Frequency Min 6X/week  PT Treatment/Interventions DME instruction;Gait training;Functional mobility training;Therapeutic activities;Therapeutic exercise;Balance training;Patient/family education  PT Recommendation  Follow Up Recommendations Skilled nursing facility  Equipment Recommended Defer to next venue  Individuals Consulted  Consulted and Agree with Results and Recommendations Patient;Family member/caregiver  Family Member Consulted daughter   Acute Rehab PT Goals  PT Goal Formulation With patient/family  Time For Goal Achievement 04/06/12  Potential to Achieve Goals Good  Pt will go Supine/Side to Sit with min assist;with rail;with HOB not 0 degrees (comment degree)  PT Goal: Supine/Side to Sit - Progress Goal set today  Pt will go Sit to Supine/Side with min assist  PT Goal: Sit to Supine/Side - Progress Goal set today  Pt will go Sit to Stand with min assist;with upper extremity assist  PT Goal: Sit to Stand - Progress Goal set today  Pt will go Stand to Sit with min assist;with upper  extremity assist  PT Goal: Stand to Sit - Progress Goal set today  Pt will Transfer Bed to Chair/Chair to Bed with min assist  PT Transfer Goal: Bed to Chair/Chair to Bed - Progress Goal set today  Additional Goals  Additional Goal #1 Pt./family  will state and demo 3/3 hip precautions  PT Goal: Additional Goal #1 - Progress Goal set today   Weldon Picking PT Acute Rehab Services [phone] [place]per [phone]

## 2012-03-30 NOTE — Progress Notes (Signed)
Patient ID: [name], female   DOB: 23-Dec-1925, 78 y.o.   MRN: [id number] PGY-1 Daily Progress Note Family Medicine Teaching Service [name] M. Leslie Montecalvo, MD Service Pager: [phone]  Subjective: Patient post-op day #1 from left total hip replacement. Required Norco this morning. Complaining of pain on medial side of leg.  Objective: Vital signs in last 24 hours: Temp:  [97.6 F (36.4 C)-99.2 F (37.3 C)] 97.9 F (36.6 C) (04/19 0529) Pulse Rate:  [65-111] 86  (04/19 0529) Resp:  [12-45] 17  (04/19 0529) BP: (146-190)/(61-105) 146/74 mmHg (04/19 0529) SpO2:  [95 %-100 %] 95 % (04/19 0529) Weight change:  Last BM Date: 03/28/12  Intake/Output from previous day: 04/18 0701 - 04/19 0700 In: 1350 [I.V.:1350] Out: 1150 [Urine:1050; Blood:100] Intake/Output this shift:   Physical Exam: Constitutional: She appears well-developed and well-nourished. No acute distress HENT:  Normocephalic. Chin with visible bruising, nontender. Cardiovascular: Normal rate and regular rhythm.  Respiratory: Effort normal and breath sounds normal.  GI: Soft.  Musculoskeletal: Patient lying still in bed. Has immobilizer block between legs which is uncomfortable for her. Dressings in place on left hip C/D/I Neurological: She is alert. Answers questions appropriately.  Skin: Skin is warm and dry.  Psychiatric: She has a normal mood and affect.   Lab Results:  [facility] 03/30/12 0520 03/29/12 0525  WBC 8.8 6.4  HGB 9.2* 10.4*  HCT 28.5* 32.4*  PLT 209 216   BMET  Basename 03/30/12 0520 03/29/12 0525  NA 137 140  K 4.6 4.0  CL 104 104  CO2 24 23  GLUCOSE 135* 105*  BUN 26* 31*  CREATININE 1.38* 1.38*  CALCIUM 8.5 8.7    Studies/Results: Dg Pelvis Portable  03/29/2012  *RADIOLOGY REPORT*  Clinical Data: Status post left hip arthroplasty.  PORTABLE PELVIS  Comparison: CT of the pelvis 03/25/2012.  Findings: The patient is status post left hip hemiarthroplasty. The prosthesis is located.   Sclerotic changes are noted in the acetabulum.  The femoral component is well seated.  The patient is status post ORIF of the right hip.  A proximal femoral nail is evident.  Degenerative changes in the right hip are less severe than on the left.  Vertebral augmentation is evident at L5.  The SI joints are fused bilaterally.  IMPRESSION:  1.  Status post left hip hemiarthroplasty without radiographic evidence for complication.  Original Report Authenticated By: [name]. [name], M.D.   Dg Hip Portable 1 View Left  03/29/2012  *RADIOLOGY REPORT*  Clinical Data: Status post left hip arthroplasty.  PORTABLE LEFT HIP - 1 VIEW  Comparison: Pelvis CT 03/25/2012.  Findings: A cross-table lateral view confirms that the left hip is located.  The femoral component is well seated.  IMPRESSION: Status post left hip hemiarthroplasty without radiographic evidence for complication.  Original Report Authenticated By: [name]. [name], M.D.    Medications:  I have reviewed the patient's current medications. Scheduled:    . acetaminophen  500 mg Oral Q4H  . antiseptic oral rinse  15 mL Mouth Rinse BID  . calcitRIOL  0.25 mcg Oral Daily  .  ceFAZolin (ANCEF) IV  1 g Intravenous Q6H  . docusate sodium  100 mg Oral BID  . donepezil  10 mg Oral QPC breakfast  . enoxaparin  30 mg Subcutaneous Q24H  . memantine  10 mg Oral BID  . methocarbamol (ROBAXIN) IV  500 mg Intravenous To PACU  . senna  1 tablet Oral BID  . sodium bicarbonate  650 mg Oral QID  . vitamin B-12  1,000 mcg Oral Q M,W,F   Continuous:    . 0.45 % NaCl with KCl 20 mEq / L 75 mL/hr at 03/29/12 2355  . DISCONTD: dextrose 5 % and 0.45% NaCl 100 mL/hr at 03/29/12 0919  . DISCONTD: lactated ringers     OEU:MPNTIRWERXVQM, acetaminophen, alum & mag hydroxide-simeth, HYDROcodone-acetaminophen, menthol-cetylpyridinium, methocarbamol (ROBAXIN) IV, methocarbamol, metoCLOPramide (REGLAN) injection, metoCLOPramide, morphine, ondansetron  (ZOFRAN) IV, ondansetron, phenol, polyethylene glycol, zolpidem, DISCONTD: bupivacaine, DISCONTD: fentaNYL, DISCONTD: HYDROcodone-acetaminophen, DISCONTD: HYDROmorphone, DISCONTD: ondansetron (ZOFRAN) IV DISCONTD: ondansetron (ZOFRAN) IV, DISCONTD: ondansetron, DISCONTD: sodium chloride irrigation  Assessment/Plan: 78 yo F with dementia presenting with left hip fracture s/p fall. Went to OR on 03/29/12 for total hip replacement.  # Left hip fracture- Seen on Xray and CT scan. Ortho has been consulted. Patient and her family opted out of surgery at admission, but changed their plan of care and therefore patient went to surgery on 03/29/12 - Post-op day #1 from [facility]. Patient doing well. Only complaint of pain is from immobilizer - Pain control with scheduled Tylenol 500mg  q4 hours. On calcitriol at home. Norco q4 prn - Continue Lovenox (adjusted for creat clearance)  - We appreciate Dr. Shelba Monroe consult and excellent care of this patient  #S/P fall- Patient denies any symptoms surrounding fall. Denies LOC.  - Monitored on telemetry. No syncope workup at this time. Can d/c telemetry. - Patient had impact to chin as well. Xrays negative. Healing well.  # Dementia- Followed by neurology, on medications at home. Stable at this time, and has been improving in recent months per son's report  - Continue Namenda and Aricept   # CKD- Followed as an outpatient by nephrology. Creat 1.76 at admission, improved to 1.6 today.  - Received IVF at admission. Restarted yesterday while NPO for surgery. Will decrease rate today - Hold home Lasix for now  - Avoid nephrotoxic agents  - History of anemia secondary to CKD. HgB stable today, even as post-op. Followed by nephro as an outpatient and will continue to be followed.  # FEN/GI-Regular diet. IVF to 1/2NS + Kcl at 75 cc/hr. Will decrease rate once taking good PO  # PPx- Lovenox (adjusted for creat clearance)   # Dispo- Patient doing well post-op. Will have  CSW speak with family again today about disposition. Will follow up PT recommendations. Discharge pending further clinical improvement  Lives with son, Leslie Yontz [phone].  Code status: Full   LOS: 5 days   Zaliyah Meikle 03/30/2012, 8:01 AM

## 2012-03-30 NOTE — Progress Notes (Signed)
Family Medicine Teaching Service Attending Note  I interviewed and examined patient [name] and reviewed their tests and x-rays.  I discussed with Dr. Mikel Cella and reviewed their note for today.  I agree with their assessment and plan.     Additionally  Status post surgery Awake alert no acute distress Sitting in chair Family needs to decide on home or SNF Stop foley Appreciate Otho's care

## 2012-03-30 NOTE — Progress Notes (Signed)
Order received, chart reviewed, noted pt needing increased A with PT today, and pt/son agreeable to SNF stay. WIll defer OT eval to SNF. Acute OT will sign off.

## 2012-03-30 NOTE — Progress Notes (Signed)
PT RE-EVALUATION Past Medical History  Diagnosis Date  . Dementia   . Hypertension   . Ulcer     Hx of  . Stomach cancer   . Snoring   . Fracture, intertrochanteric, left femur 03/25/2012  . Fracture, intertrochanteric, right femur 06/2008    s/p closed reduction and internal fixation  . Nodule of left lung     followed by Dr. Vassie Loll // LLL spiculated nodule noted per CT in 2012 1.5x1.6x2.4, PET in 06/03/2011 was low uptake.   Past Surgical History  Procedure Date  . Stomach surgery     3/4 of stomach removed due to stomach cancer  . Hip surgery 2009    right    03/30/12 0906  PT Visit Information  Last PT Received On 03/30/12  PT Time Calculation  PT Start Time 0857  PT Stop Time 0935  PT Time Calculation (min) 38 min  Subjective Data  Patient Stated Goal walk independently  Precautions  Precautions Posterior Hip;Fall  Restrictions  Weight Bearing Restrictions Yes  LLE Weight Bearing WBAT  Home Living  Lives With Son  Available Help at Discharge Family  Type of Home House  Home Access Stairs to enter;Ramped entrance;Other (comment) (ramp built 03/29/12)  Home Layout One level  Bathroom Shower/Tub Walk-in shower;Door  Horticulturist, commercial Yes  How Accessible Accessible via walker  Home Adaptive Equipment None  Prior Function  Level of Independence Independent  Driving No  Vocation Retired  Geneticist, molecular No difficulties  Cognition  Overall Cognitive Status History of cognitive impairments - at baseline  Arousal/Alertness Awake/alert  Behavior During Session [facility] for tasks performed  Cognition - Other Comments Able to follow directions and cooperate with requests  Sensation  Light Touch Appears Intact  Coordination  Gross Motor Movements are Fluid and Coordinated Yes  Fine Motor Movements are Fluid and Coordinated Not tested  Right Upper Extremity Assessment  RUE ROM/Strength/Tone WFL for tasks assessed  RUE  Sensation WFL - Light Touch  Left Upper Extremity Assessment  LUE ROM/Strength/Tone WFL for tasks assessed  LUE Sensation WFL - Light Touch  Right Lower Extremity Assessment  RLE ROM/Strength/Tone WFL  RLE Sensation WFL - Light Touch  Left Lower Extremity Assessment  LLE ROM/Strength/Tone Unable to fully assess;Due to pain;Due to precautions (s/p hip hemiarthroplasty)  LLE Sensation WFL - Light Touch  Bed Mobility  Bed Mobility Supine to Sit  Supine to Sit 1: +2 Total assist;Other (comment);HOB elevated (. 40%)  Supine to Sit: Patient Percentage 40%  Sit to Supine Not Tested (comment);Other (comment) (pt. remained in recliner)  Details for Bed Mobility Assistance pt. needed cueing for technique and safety  Transfers  Transfers Sit to Stand;Stand to Sit  Sit to Stand 1: +2 Total assist;With upper extremity assist;From bed  Sit to Stand: Patient Percentage 50%  Stand to Sit 1: +2 Total assist;With armrests;To chair/3-in-1  Stand to Sit: Patient Percentage 50%  Details for Transfer Assistance cues for safe technique and hand placement  Ambulation/Gait  Ambulation/Gait Assistance 1: +2 Total assist  Ambulation/Gait: Patient Percentage 60  Ambulation Distance (Feet) 2 Feet  Assistive device Rolling walker  Ambulation/Gait Assistance Details pt. tends to stand too far behind RW; needed postural cues and assist to move left LE forward  Gait Pattern Step-to pattern;Decreased step length - right;Decreased step length - left;Decreased hip/knee flexion - left;Antalgic  Gait velocity decreased  Ambulation/Gait Yes  Balance  Balance Assessed Yes  Static Sitting Balance  Static  Sitting - Balance Support Bilateral upper extremity supported;Feet supported  Static Sitting - Level of Assistance 4: Min assist  Static Sitting - Comment/# of Minutes 5  RUE Assessment  RUE Assessment WFL  LUE Assessment  LUE Assessment WFL  RLE Assessment  RLE Assessment WFL  LLE Assessment  LLE Assessment  X  Cervical Assessment  Cervical Assessment [facility]  Thoracic Assessment  Thoracic Assessment WFL  Lumbar Assessment  Lumbar Assessment [facility]  Exercises  Exercises Total Joint  Total Joint Exercises  Ankle Circles/Pumps AROM;Both;10 reps;Supine  Quad Sets AROM;Right;5 reps;Supine  PT - End of Session  Equipment Utilized During Treatment Gait belt  Activity Tolerance Patient limited by fatigue;Patient limited by pain  Patient left with call bell/phone within reach;in chair;with family/visitor present  Nurse Communication Mobility status;Precautions;Weight bearing status  PT Assessment  Clinical Impression Statement Pt. is now s/p left hip hemiarthroplasty after a period of medical management for hip f x.  Pt. continues to have pain but it seems as though it is a more managable pain than what sehe was having pre-operatively.  Believe that pt. will need SNF for rehab before return home with her son.  She needs acute PT to address below problems and to maximize functional mobility for  ease of burden of care at [place].Marland Kitchen  PT Recommendation/Assessment Patient needs continued PT services  PT Problem List Decreased strength;Decreased range of motion;Decreased activity tolerance;Decreased balance;Decreased mobility;Decreased cognition;Decreased knowledge of use of DME;Decreased safety awareness;Decreased knowledge of precautions;Pain  Barriers to Discharge Decreased caregiver support  PT Therapy Diagnosis  Acute pain;Difficulty walking  PT Plan  PT Frequency Min 6X/week  PT Treatment/Interventions DME instruction;Gait training;Functional mobility training;Therapeutic activities;Therapeutic exercise;Balance training;Patient/family education  PT Recommendation  Follow Up Recommendations Skilled nursing facility  Equipment Recommended Defer to next venue  Individuals Consulted  Consulted and Agree with Results and Recommendations Patient;Family member/caregiver  Family Member Consulted daughter     Acute Rehab PT Goals  PT Goal Formulation With patient/family  Time For Goal Achievement 04/06/12  Potential to Achieve Goals Good  Pt will go Supine/Side to Sit with min assist;with rail;with HOB not 0 degrees (comment degree)  PT Goal: Supine/Side to Sit - Progress Goal set today  Pt will go Sit to Supine/Side with min assist  PT Goal: Sit to Supine/Side - Progress Goal set today  Pt will go Sit to Stand with min assist;with upper extremity assist  PT Goal: Sit to Stand - Progress Goal set today  Pt will go Stand to Sit with min assist;with upper extremity assist  PT Goal: Stand to Sit - Progress Goal set today  Pt will Transfer Bed to Chair/Chair to Bed with min assist  PT Transfer Goal: Bed to Chair/Chair to Bed - Progress Goal set today  Additional Goals  Additional Goal #1 Pt./family  will state and demo 3/3 hip precautions  PT Goal: Additional Goal #1 - Progress Goal set today   Weldon Picking PT Acute Rehab Services [phone] [place] [phone]

## 2012-03-31 LAB — GLUCOSE, CAPILLARY: Glucose-Capillary: 116 mg/dL — ABNORMAL HIGH (ref 70–99)

## 2012-03-31 LAB — CBC
HCT: 22.5 % — ABNORMAL LOW (ref 36.0–46.0)
MCH: 32.2 pg (ref 26.0–34.0)
Platelets: 156 10*3/uL (ref 150–400)
Platelets: 159 10*3/uL (ref 150–400)
RBC: 2.27 MIL/uL — ABNORMAL LOW (ref 3.87–5.11)
RDW: 13.7 % (ref 11.5–15.5)
RDW: 13.8 % (ref 11.5–15.5)
WBC: 7.4 10*3/uL (ref 4.0–10.5)
WBC: 8 10*3/uL (ref 4.0–10.5)

## 2012-03-31 LAB — BASIC METABOLIC PANEL
CO2: 24 mEq/L (ref 19–32)
Calcium: 8 mg/dL — ABNORMAL LOW (ref 8.4–10.5)
GFR calc Af Amer: 35 mL/min — ABNORMAL LOW (ref >90)
Sodium: 133 mEq/L — ABNORMAL LOW (ref 135–145)

## 2012-03-31 NOTE — Progress Notes (Signed)
Patient ID: [name], female   DOB: 1926/11/27, 78 y.o.   MRN: [id number] PGY-1 Daily Progress Note Family Medicine Teaching Service [name] M. Sie Formisano, MD Service Pager: [phone]  Subjective: Patient had some urinary incontinence after foley was removed overnight. She required Morphine and Norco for pain according to [facility].  Objective: Vital signs in last 24 hours: Temp:  [97.5 F (36.4 C)-98.9 F (37.2 C)] 98.1 F (36.7 C) (04/20 0900) Pulse Rate:  [72-84] 73  (04/20 0900) Resp:  [17-20] 17  (04/20 0900) BP: (117-135)/(47-71) 123/71 mmHg (04/20 0900) SpO2:  [93 %-100 %] 95 % (04/20 0900) Weight:  [115 lb 3.2 oz (52.254 kg)] 115 lb 3.2 oz (52.254 kg) (04/19 2159) Weight change:  Last BM Date: 03/28/12  Intake/Output from previous day: 04/19 0701 - 04/20 0700 In: 1227.5 [P.O.:340; I.V.:887.5] Out: 680 [Urine:680] Intake/Output this shift: Total I/O In: 50 [P.O.:50] Out: -  Physical Exam: Patient sleeping comfortably. Will see patient later today for further assessment.  Lab Results:  [facility] 03/31/12 0553 03/30/12 0520  WBC 7.4 8.8  HGB 7.3* 9.2*  HCT 22.0* 28.5*  PLT 156 209   BMET  Basename 03/31/12 0553 03/30/12 0520  NA 133* 137  K 4.8 4.6  CL 103 104  CO2 24 24  GLUCOSE 118* 135*  BUN 28* 26*  CREATININE 1.53* 1.38*  CALCIUM 8.0* 8.5    Studies/Results: Dg Pelvis Portable  03/29/2012  *RADIOLOGY REPORT*  Clinical Data: Status post left hip arthroplasty.  PORTABLE PELVIS  Comparison: CT of the pelvis 03/25/2012.  Findings: The patient is status post left hip hemiarthroplasty. The prosthesis is located.  Sclerotic changes are noted in the acetabulum.  The femoral component is well seated.  The patient is status post ORIF of the right hip.  A proximal femoral nail is evident.  Degenerative changes in the right hip are less severe than on the left.  Vertebral augmentation is evident at L5.  The SI joints are fused bilaterally.  IMPRESSION:  1.  Status post  left hip hemiarthroplasty without radiographic evidence for complication.  Original Report Authenticated By: Jamesetta Orleans. MATTERN, M.D.   Dg Hip Portable 1 View Left  03/29/2012  *RADIOLOGY REPORT*  Clinical Data: Status post left hip arthroplasty.  PORTABLE LEFT HIP - 1 VIEW  Comparison: Pelvis CT 03/25/2012.  Findings: A cross-table lateral view confirms that the left hip is located.  The femoral component is well seated.  IMPRESSION: Status post left hip hemiarthroplasty without radiographic evidence for complication.  Original Report Authenticated By: Jamesetta Orleans. MATTERN, M.D.    Medications:  I have reviewed the patient's current medications. Scheduled:    . acetaminophen  500 mg Oral Q4H  . antiseptic oral rinse  15 mL Mouth Rinse BID  . calcitRIOL  0.25 mcg Oral Daily  .  ceFAZolin (ANCEF) IV  1 g Intravenous Q6H  . docusate sodium  100 mg Oral BID  . donepezil  10 mg Oral QPC breakfast  . enoxaparin  30 mg Subcutaneous Q24H  . memantine  10 mg Oral BID  . senna  1 tablet Oral BID  . sodium bicarbonate  650 mg Oral QID  . vitamin B-12  1,000 mcg Oral Q M,W,F   Continuous:    . 0.45 % NaCl with KCl 20 mEq / L 75 mL/hr at 03/31/12 0428   JYN:WGNFAOZHYQMVH, alum & mag hydroxide-simeth, HYDROcodone-acetaminophen, menthol-cetylpyridinium, methocarbamol (ROBAXIN) IV, methocarbamol, metoCLOPramide (REGLAN) injection, metoCLOPramide, morphine, ondansetron (ZOFRAN) IV, ondansetron, phenol, polyethylene glycol,  zolpidem, DISCONTD: acetaminophen  Assessment/Plan: 78 yo F with dementia presenting with left hip fracture s/p fall. Went to OR on 03/29/12 for total hip replacement.  # Left hip fracture- Seen on Xray and CT scan. Ortho has been consulted. Patient and her family opted out of surgery at admission, but changed their plan of care and therefore patient went to surgery on 03/29/12 - Post-op day #2 from [facility]. Patient doing well. - Pain control with scheduled Tylenol 500mg  q4  hours. On calcitriol at home. Morphine and Norco q4 prn for better pain controll - Continue Lovenox (adjusted for creat clearance)  - We appreciate Dr. Shelba Flake consult and excellent care of this patient. Will continue to follow up post-op recommendations  #S/P fall- Patient denies any symptoms surrounding fall. Denies LOC.  - Monitored on telemetry. No syncope workup at this time. Can d/c telemetry. - Patient had impact to chin as well. Xrays negative. Healing well.  # Dementia- Followed by neurology, on medications at home. Stable at this time, and has been improving in recent months per son's report  - Continue Namenda and Aricept   # CKD- Followed as an outpatient by nephrology. Creat 1.76 at admission, improved to 1.38, but now trending back up. Will continue to monitor - Received IVF at admission. Restarted yesterday while NPO for surgery. Will decrease rate today - Hold home Lasix for now  - Avoid nephrotoxic agents  - History of anemia secondary to CKD. HgB stable pre-op but had acute drop from 9.2 to 7.3 today. Will recheck CBC this evening and consider transfusion, if needed.  # FEN/GI-Regular diet. IVF to 1/2NS + Kcl at 75 cc/hr. Will decrease rate once taking good PO  # PPx- Lovenox (adjusted for creat clearance)   # Dispo- Patient doing well post-op but continues to have pain. Family agreeable to short term SNF. Will continue to monitor patient and her pain, as well as surgery recommendations.   Lives with son, Joni Colegrove [phone].  Code status: Full   LOS: 6 days   Zymire Turnbo 03/31/2012, 10:03 AM

## 2012-03-31 NOTE — Progress Notes (Signed)
Nursing Note  Pt noted to be grossly incontinent. Bladder scanned for <211mls. Did not need to do I&O cath at this time. Will monitor. C.Kacey Dysert, RN.

## 2012-03-31 NOTE — Progress Notes (Signed)
Family Medicine Teaching Service Attending Note  I interviewed and examined patient [name] and reviewed their tests and x-rays.  I discussed with Dr. Dayle Points and reviewed their note for today.  I agree with their assessment and plan.     Additionally  Sleepy but arousable Does not remember surgery Monitor Hgb transfuse if lower Seeking SNF for rehab Maximize nutrition

## 2012-03-31 NOTE — Progress Notes (Signed)
Nursing Note  Foley catheter removed 4/19 at 1800. Pt voided at midnight with a PVR . Attempted to get pt to void again at 0200, but pt unable. Bladder scanned for . Notified MD on call who stated to retry in 2 hrs hours and if unable to void, do an I&O cath at that time. Will continue to monitor. C.Leilani Cespedes, RN.

## 2012-03-31 NOTE — Progress Notes (Signed)
Physical Therapy Treatment Patient Details Name: [name] MRN: [id number] DOB: July 31, 1926 Today's Date: 03/31/2012 Time: 1191-4782 PT Time Calculation (min): 24 min  PT Assessment / Plan / Recommendation Comments on Treatment Session  Brother present for session, aware of some improvement in mobility    Follow Up Recommendations  Skilled nursing facility    Equipment Recommendations  Defer to next venue    Frequency Min 6X/week   Plan Discharge plan needs to be updated    Precautions / Restrictions Precautions Precautions: Posterior Hip;Fall Precaution Booklet Issued: Yes (comment) Precaution Comments: Posted posterior hip precautions and board, discussed w/RN [place] Required Braces or Orthoses: Other Brace/Splint (needs abduction pillow on at night in) Other Brace/Splint: bed pillow between knees when up in cahir Restrictions Weight Bearing Restrictions: Yes LLE Weight Bearing: Weight bearing as tolerated       Mobility  Bed Mobility Bed Mobility: Supine to Sit Supine to Sit: 1: +2 Total assist;Other (comment);HOB elevated Supine to Sit: Patient Percentage: 40% Sit to Supine: Not Tested (comment);Other (comment) Details for Bed Mobility Assistance: pt. needed cueing for technique and safety Transfers Transfers: Sit to Stand;Stand to Sit Sit to Stand: 1: +2 Total assist;With upper extremity assist;From bed Sit to Stand: Patient Percentage: 50% Stand to Sit: 1: +2 Total assist;With armrests;To chair/3-in-1 Stand to Sit: Patient Percentage: 50% Details for Transfer Assistance: technique and safety cues Ambulation/Gait Ambulation/Gait Assistance: 1: +2 Total assist Ambulation/Gait: Patient Percentage: 60 Ambulation Distance (Feet): 3 Feet Assistive device: Rolling walker Ambulation/Gait Assistance Details: assist to advance L LE , postural cues for upright Gait Pattern: Step-to pattern;Decreased step length - right;Decreased step length - left;Decreased hip/knee  flexion - left;Antalgic Gait velocity: decreased    Exercises Total Joint Exercises Ankle Circles/Pumps: AROM;Both;10 reps;Supine Quad Sets: AROM;Right;5 reps;Supine   PT Goals Acute Rehab PT Goals PT Goal: Supine/Side to Sit - Progress: Progressing toward goal PT Goal: Sit to Stand - Progress: Progressing toward goal PT Goal: Stand to Sit - Progress: Progressing toward goal PT Transfer Goal: Bed to Chair/Chair to Bed - Progress: Progressing toward goal Additional Goals PT Goal: Additional Goal #1 - Progress: Not progressing  Visit Information  Last PT Received On: 03/31/12 Assistance Needed: +2    Subjective Data  Subjective: I'm hurting   Cognition  Overall Cognitive Status: History of cognitive impairments - at baseline Arousal/Alertness: Awake/alert Behavior During Session: [facility] for tasks performed Cognition - Other Comments: Able to follow directions and cooperate with requests    Balance     End of Session PT - End of Session Equipment Utilized During Treatment: Gait belt Activity Tolerance: Patient limited by fatigue;Patient limited by pain Patient left: with call bell/phone within reach;in chair;with family/visitor present Nurse Communication: Mobility status;Precautions;Weight bearing status    Ferman Hamming 03/31/2012, 12:17 PM Acute Rehabilitation Services [phone] (pager)

## 2012-03-31 NOTE — Telephone Encounter (Signed)
LMOM to CB. 

## 2012-04-01 LAB — CBC
HCT: 21.4 % — ABNORMAL LOW (ref 36.0–46.0)
Hemoglobin: 7.1 g/dL — ABNORMAL LOW (ref 12.0–15.0)
MCHC: 33.2 g/dL (ref 30.0–36.0)
MCV: 96.4 fL (ref 78.0–100.0)
RDW: 13.7 % (ref 11.5–15.5)
WBC: 8 10*3/uL (ref 4.0–10.5)

## 2012-04-01 LAB — TYPE AND SCREEN: Unit division: 0

## 2012-04-01 LAB — BASIC METABOLIC PANEL
BUN: 30 mg/dL — ABNORMAL HIGH (ref 6–23)
Chloride: 104 mEq/L (ref 96–112)
Creatinine, Ser: 1.47 mg/dL — ABNORMAL HIGH (ref 0.50–1.10)
Glucose, Bld: 111 mg/dL — ABNORMAL HIGH (ref 70–99)
Potassium: 5.6 mEq/L — ABNORMAL HIGH (ref 3.5–5.1)

## 2012-04-01 MED ORDER — SODIUM CHLORIDE 0.45 % IV SOLN: INTRAVENOUS | Status: DC

## 2012-04-01 NOTE — Progress Notes (Signed)
Patient ID: [name], female   DOB: 1926-10-24, 78 y.o.   MRN: [id number] PGY-1 Daily Progress Note Family Medicine Teaching Service Service Pager: [phone]  Subjective: Doing well this morning.  Does have some left hip pain.  Drinking fluids okay, no appetite for solid food yet.  No nausea.  No reported BM.  Objective: Vital signs in last 24 hours: Temp:  [97.3 F (36.3 C)-98.1 F (36.7 C)] 97.3 F (36.3 C) (04/21 0453) Pulse Rate:  [68-73] 71  (04/21 0453) Resp:  [16-18] 16  (04/21 0453) BP: (116-129)/(38-71) 126/41 mmHg (04/21 0453) SpO2:  [93 %-96 %] 95 % (04/21 0453) Weight:  [120 lb (54.432 kg)] 120 lb (54.432 kg) (04/20 2020) Weight change: 4 lb 12.8 oz (2.177 kg) Last BM Date: 03/28/12  Intake/Output from previous day: 04/20 0701 - 04/21 0700 In: 728 [P.O.:290; I.V.:438] Out: -  Intake/Output this shift:   Physical Exam: Gen: sleeping, but easily arousable; appears pleasantly confused CV: RRR, no murmurs Pulm: CTAB Left Hip: dressing c/d/i, no surround erythema, mildly tender to touch  Lab Results:  [facility] 04/01/12 0602 03/31/12 1753  WBC 8.0 8.0  HGB 7.1* 7.3*  HCT 21.4* 22.5*  PLT 182 159   BMET  Basename 04/01/12 0602 03/31/12 0553  NA 137 133*  K 5.6* 4.8  CL 104 103  CO2 25 24  GLUCOSE 111* 118*  BUN 30* 28*  CREATININE 1.47* 1.53*  CALCIUM 8.4 8.0*    Studies/Results: No results found.  Medications:  I have reviewed the patient's current medications. Scheduled:    . acetaminophen  500 mg Oral Q4H  . antiseptic oral rinse  15 mL Mouth Rinse BID  . calcitRIOL  0.25 mcg Oral Daily  . docusate sodium  100 mg Oral BID  . donepezil  10 mg Oral QPC breakfast  . enoxaparin  30 mg Subcutaneous Q24H  . memantine  10 mg Oral BID  . senna  1 tablet Oral BID  . sodium bicarbonate  650 mg Oral QID  . vitamin B-12  1,000 mcg Oral Q M,W,F   Continuous:    . 0.45 % NaCl with KCl 20 mEq / L 75 mL/hr at 03/31/12 0428   JYN:WGNFAOZHYQMVH,  alum & mag hydroxide-simeth, HYDROcodone-acetaminophen, menthol-cetylpyridinium, methocarbamol (ROBAXIN) IV, methocarbamol, metoCLOPramide (REGLAN) injection, metoCLOPramide, morphine, ondansetron (ZOFRAN) IV, ondansetron, phenol, polyethylene glycol, zolpidem  Assessment/Plan: 78 yo F with dementia presenting with left hip fracture s/p fall. Went to OR on 03/29/12 for total hip replacement.  # Left hip fracture- Seen on Xray and CT scan. Ortho has been consulted. Patient and her family opted out of surgery at admission, but changed their plan of care and therefore patient went to surgery on 03/29/12 - Post-op day #3 from [facility]. Patient doing well. - Pain control with scheduled Tylenol 500mg  q4 hours. On calcitriol at home. Morphine and Norco q4 prn for better pain control - Continue Lovenox (adjusted for creat clearance)  - We appreciate Dr. Shelba Flake consult and excellent care of this patient. Will continue to follow up post-op recommendations  # S/P fall- Patient denies any symptoms surrounding fall. Denies LOC.  - Monitored on telemetry. No syncope workup at this time. Can d/c telemetry. - Patient had impact to chin as well. Xrays negative. Healing well.  # Dementia- Followed by neurology, on medications at home. Stable at this time, and has been improving in recent months per son's report  - Continue Namenda and Aricept   # CKD- Followed as an  outpatient by nephrology. Creat 1.76 at admission, improved to 1.38, but now trending back up. Will continue to monitor - Received IVF at admission.  - Hold home Lasix for now  - Avoid nephrotoxic agents  - History of anemia secondary to CKD. HgB stable pre-op but had acute drop from 9.2 to 7.3 today. Will recheck CBC this evening and consider transfusion, if needed.  # Anemia: Chronic anemia due to CKD with acute blood loss anemia. - Hgb stable at 7.1 this morning - transfusion threshold <7 - recheck tomorrow am  # FEN/GI-Regular diet. IVF to  1/2NS at 75 cc/hr. Will decrease rate once taking good PO.  Will stop KCl in IVFs.  # PPx- Lovenox (adjusted for creat clearance)   # Dispo- Patient doing well post-op but continues to have pain. Family agreeable to short term SNF. Will continue to monitor patient and her pain, as well as surgery recommendations.   Lives with son, Shamon Lobo [phone].  Code status: Full   LOS: 7 days   BOOTH, Allis Quirarte 04/01/2012, 6:38 AM

## 2012-04-01 NOTE — Progress Notes (Signed)
Family Medicine Teaching Service Attending Note  I interviewed and examined patient [name] and reviewed their tests and x-rays.  I discussed with Dr. Elwyn Reach and reviewed their note for today.  I agree with their assessment and plan.     Additionally  Feeling well this AM but still has pain Recheck hgb - transfuse if worsening or symptomatic For rehab in SNF

## 2012-04-01 NOTE — Progress Notes (Signed)
CSW met with pt and pt's son and provided bed offers. Pt and pt's son to review and f/u with weekday CSW. Pt hopeful for [facility]. CSW left message for [facility] admissions.  Dellie Burns, MSW, [place] [phone] (weekend)

## 2012-04-02 ENCOUNTER — Ambulatory Visit: Payer: Medicare Other | Admitting: Family Medicine

## 2012-04-02 ENCOUNTER — Encounter (HOSPITAL_COMMUNITY): Payer: Self-pay | Admitting: Orthopedic Surgery

## 2012-04-02 LAB — BASIC METABOLIC PANEL
BUN: 31 mg/dL — ABNORMAL HIGH (ref 6–23)
CO2: 25 mEq/L (ref 19–32)
Calcium: 8.8 mg/dL (ref 8.4–10.5)
Creatinine, Ser: 1.48 mg/dL — ABNORMAL HIGH (ref 0.50–1.10)
GFR calc non Af Amer: 32 mL/min — ABNORMAL LOW (ref >90)
Glucose, Bld: 95 mg/dL (ref 70–99)

## 2012-04-02 LAB — CBC
HCT: 22.3 % — ABNORMAL LOW (ref 36.0–46.0)
Hemoglobin: 7.3 g/dL — ABNORMAL LOW (ref 12.0–15.0)
MCH: 31.9 pg (ref 26.0–34.0)
MCHC: 32.7 g/dL (ref 30.0–36.0)
MCV: 97.4 fL (ref 78.0–100.0)
RBC: 2.29 MIL/uL — ABNORMAL LOW (ref 3.87–5.11)

## 2012-04-02 MED ORDER — TRAMADOL HCL 50 MG PO TABS
50.0000 mg | ORAL_TABLET | Freq: Two times a day (BID) | ORAL | Status: DC | PRN
  Filled 2012-04-02: qty 1

## 2012-04-02 MED ORDER — ALUM & MAG HYDROXIDE-SIMETH 200-200-20 MG/5ML PO SUSP: 30.0000 mL | ORAL | Status: AC | PRN

## 2012-04-02 MED ORDER — ONDANSETRON HCL 4 MG PO TABS: 4.0000 mg | ORAL_TABLET | Freq: Four times a day (QID) | ORAL | Status: AC | PRN

## 2012-04-02 MED ORDER — HYDROCODONE-ACETAMINOPHEN 5-325 MG PO TABS: 1.0000 | ORAL_TABLET | Freq: Four times a day (QID) | ORAL | Status: AC | PRN

## 2012-04-02 MED ORDER — DSS 100 MG PO CAPS: 100.0000 mg | ORAL_CAPSULE | Freq: Two times a day (BID) | ORAL | Status: AC | PRN

## 2012-04-02 MED ORDER — ACETAMINOPHEN 500 MG PO TABS: 500.0000 mg | ORAL_TABLET | ORAL | Status: AC

## 2012-04-02 MED ORDER — TRAMADOL HCL 50 MG PO TABS: 50.0000 mg | ORAL_TABLET | Freq: Two times a day (BID) | ORAL | Status: AC | PRN

## 2012-04-02 MED ORDER — ENOXAPARIN SODIUM 30 MG/0.3ML ~~LOC~~ SOLN: 30.0000 mg | Freq: Every day | SUBCUTANEOUS | Status: DC

## 2012-04-02 NOTE — Progress Notes (Signed)
LATE ENTRY: 03/30/12   Clinical Social Work Department CLINICAL SOCIAL WORK PLACEMENT NOTE 04/02/2012  Patient:  CORRENE, LALANI R  Account Number:  [id number] Admit date:  03/25/2012  Clinical Social Worker:  Theresia Bough, LCSWA  Date/time:     Clinical Social Work is seeking post-discharge placement for this patient at the following level of care:   SKILLED NURSING   (*CSW will update this form in Epic as items are completed)     Patient/family provided with [facility] Department of Clinical Social Work's list of facilities offering this level of care within the geographic area requested by the patient (or if unable, by the patient's family).    Patient/family informed of their freedom to choose among providers that offer the needed level of care, that participate in Medicare, Medicaid or managed care program needed by the patient, have an available bed and are willing to accept the patient.    Patient/family informed of [facility]' ownership interest in [facility], as well as of the fact that they are under no obligation to receive care at this facility.  PASARR submitted to EDS on 07/03/2008 PASARR number received from [employer] on 07/03/2008  FL2 transmitted to all facilities in geographic area requested by pt/family on  03/30/2012 FL2 transmitted to all facilities within larger geographic area on   Patient informed that his/her managed care company has contracts with or will negotiate with  certain facilities, including the following:     Patient/family informed of bed offers received:  03/31/2012 Patient chooses bed at  Physician recommends and patient chooses bed at    Patient to be transferred to  on   Patient to be transferred to facility by   The following physician request were entered in Epic:   Additional Comments:  Theresia Bough, MSW, LCSWA [phone]

## 2012-04-02 NOTE — Discharge Summary (Signed)
Physician Discharge Summary  Patient ID: [name] MRN: [id number] DOB/AGE: 09/04/1926 78 y.o.  Admit date: 03/25/2012 Discharge date: 04/02/2012  Admission Diagnoses: Left hip fracture  Discharge Diagnoses:  Principal Problem:  *Fracture, intertrochanteric, left femur Active Problems:  Fall from standing  Dementia   Discharged Condition: stable  Hospital Course: Patient is an 78 yo F with pmh of dementia, CKD, prior right hip fracture and HTN presenting with left hip fracture s/p mechanical fall at home. Patient did not have syncopal episode, and therefore she did not have syncope work-up. Patient was found to have insignificant chin impact as well as a left hip intertrochanteric fracture seen on Xray and CT scan. Ortho (Dr. Dion Saucier) was consulted at admission and initially patient and her family opted out of surgery. She therefore had PT/OT, case management and social work consults to come up with a plan at home where she lives with her son. Due to increasing pain with physical therapy, patient/family changed their plan of care and patient went to surgery on 03/29/12 for a total hip replacement. Patient tolerated procedure well. Post-op course complicated by acute blood loss anemia with a drop in HgB from 9 to 7.3. Patient did not receive blood transfusion for anemia, but would consider rechecking CBC after discharge or if patient becomes symptomatic. Pain control with scheduled Tylenol 500mg  q4 hours, which she will continue, as well as Calcitriol, Tramadol 50mg  BID prn and Norco q6 prn. Given her fracture, patient should continue Lovenox (adjusted for creat clearance) for 3 additional weeks. Patient has a history of dementia- Followed by neurology, on medications at home including Namenda and Aricept. Stable at this time, and has been improving in recent months per son's report. These medications were continued during this hospitalization, as well as at discharge. She also has a history of CKD-  Followed as an outpatient by nephrology. Creat 1.76 at admission, improved after IVF. Home lasix was held during admission, but restarted at discharge. All nephrotoxic agents were avoiding. Patient's creat was 1.48 on day of discharge, but she was taking PO. Would recommend an outpatient appointment with nephrology to continue to follow her kidney disease. Of note, patient's family did have a meeting with palliative care to discuss goals of care given patient's dementia. Patient is full code, and son would like her to make all of her own decisions. She lived with son prior to admission, and he is hopeful that she will return.  Consults: orthopedic surgery and PT/OT, case management, social work, palliative medicine  Significant Diagnostic Studies:    03/25/2012 14:55 03/25/2012 18:43 03/26/2012 05:40 03/27/2012 06:15 03/28/2012 07:17 03/29/2012 05:25 03/30/2012 05:20 03/31/2012 05:53 04/01/2012 06:02 04/02/2012 06:40  HGB 11.4 (L) 11.3 (L) 10.2 (L) 9.9 (L) 10.1 (L) 10.4 (L) 9.2 (L) 7.3 (L) 7.1 (L) 7.3 (L)     03/25/2012 14:55 03/25/2012 18:43 03/26/2012 05:40 03/27/2012 06:15 03/28/2012 07:17 03/29/2012 05:25 03/30/2012 05:20 03/31/2012 05:53 04/01/2012 06:02 04/02/2012 06:40  Creat 1.76 (H) 1.70 (H) 1.84 (H) 1.60 (H) 1.53 (H) 1.38 (H) 1.38 (H) 1.53 (H) 1.47 (H) 1.48 (H)   Dg Mandible 4 Views 03/25/2012   IMPRESSION: No acute osseous findings. Dg Chest 1 View 03/25/2012  IMPRESSION:  1.  No acute cardiopulmonary process seen. 2.  Known left lower lobe pulmonary nodule not well visualized.   Dg Cervical Spine Complete 03/25/2012   IMPRESSION: No gross evidence of fracture.  Exam is limited in that there is no lateral projection.  Best images were obtained due to  patient immobility.  If concern for cervical spine fracture recommend CT of the neck.   Ct Hip Left Wo Contrast  03/25/2012   IMPRESSION:  1.  Nondisplaced intertrochanteric left femur fracture extending into the basicervical region medially. 2.  Underlying severe  left hip osteoarthritis with multiple loose bodies.    Treatments: surgery: Left hip hemiarthroplasty  Discharge Exam: Blood pressure 151/71, pulse 66, temperature 98.1 F (36.7 C), temperature source Oral, resp. rate 18, height 5' (1.524 m), weight 113 lb (51.256 kg), SpO2 92.00%. Gen: Sitting up in chair, awake, very pleasant.  CV: RRR, no murmurs  Pulm: CTAB  Left Hip: dressing c/d/i, no surround erythema, mildly tender to touch. Some edema of LLE  Neuro: Oriented to person only.  Disposition: Skilled Nursing Facility  Discharge Orders    Future Appointments: Provider: Department: Dept Phone: Center:   06/15/2012 10:00 AM [facility] [phone] [facility]     Medication List  As of 04/02/2012 12:15 PM   TAKE these medications         acetaminophen 500 MG tablet   Commonly known as: TYLENOL   Take 1 tablet (500 mg total) by mouth every 4 (four) hours.      alum & mag hydroxide-simeth 200-200-20 MG/5ML suspension   Commonly known as: MAALOX/MYLANTA   Take 30 mLs by mouth every 4 (four) hours as needed for indigestion.      ARICEPT 10 MG tablet   Generic drug: donepezil   Take 10 mg by mouth daily after breakfast.      aspirin 81 MG tablet   Take 81 mg by mouth daily.      calcitRIOL 1 MCG/ML solution   Commonly known as: ROCALTROL   Take 0.25 mcg by mouth daily.      DSS 100 MG Caps   Take 100 mg by mouth 2 (two) times daily as needed for constipation.      enoxaparin 30 MG/0.3ML injection   Commonly known as: LOVENOX   Inject 0.3 mLs (30 mg total) into the skin daily.      EQL GUMMY ADULT PO   Take 1 each by mouth daily.      furosemide 40 MG tablet   Commonly known as: LASIX   Take 40 mg by mouth daily.      HYDROcodone-acetaminophen 5-325 MG per tablet   Commonly known as: NORCO   Take 1-2 tablets by mouth every 6 (six) hours as needed.      memantine 10 MG tablet   Commonly known as: NAMENDA   Take 10 mg by mouth 2 (two) times daily.       ondansetron 4 MG tablet   Commonly known as: ZOFRAN   Take 1 tablet (4 mg total) by mouth every 6 (six) hours as needed for nausea.      sodium bicarbonate 650 MG tablet   Take 650 mg by mouth 4 (four) times daily.      traMADol 50 MG tablet   Commonly known as: ULTRAM   Take 1 tablet (50 mg total) by mouth every 12 (twelve) hours as needed.      vitamin B-12 1000 MCG tablet   Commonly known as: CYANOCOBALAMIN   Take 1,000 mcg by mouth every Monday, Wednesday, and Friday. Three times a week           Follow-up Information    Follow up with Elvina Sidle, MD .         Signed: Mikel Cella, AMBER  04/02/2012, 12:15 PM  Patient discharged to SNF while Dr. Mikel Cella in clinic, no updates to Discharge summary needed to be made.   Hisako Bugh 04/02/2012 3:36 PM

## 2012-04-02 NOTE — Discharge Summary (Signed)
Seen and examined earlier today.  Agree with DC as outlined by Dr. Lula Olszewski.

## 2012-04-02 NOTE — Progress Notes (Signed)
Clinical Social Worker (CSW) spoke with pt son [name] who confirmed he would like placement at [facility] for pt. CSW will facilitate with dc when dc paperwork is complete.  Theresia Bough, MSW, Theresia Majors [phone]

## 2012-04-02 NOTE — Progress Notes (Signed)
Pt. is ready to be d/c to [facility],IV has been d/c.

## 2012-04-02 NOTE — Progress Notes (Signed)
Clinical Child psychotherapist (CSW) confirmed pt son completed paperwork at facility. CSW prepared dc packet and placed it in pt shadow chart. CSW contacted [facility] for a 17:00pm pick up and transport to [facility]. No further CSW needs addressed.  CSW signing off.  Theresia Bough, MSW, Theresia Majors [phone]

## 2012-04-02 NOTE — Progress Notes (Signed)
Late Entry for 03/30/12  Clinical Social Work Department BRIEF PSYCHOSOCIAL ASSESSMENT 04/02/2012  Patient:  [name], [name]     Account Number:  [id number]     Admit date:  03/25/2012  Clinical Social Worker:  Lourdes Sledge  Date/Time:  04/02/2012 11:49 AM  Referred by:  Physician  Date Referred:  03/30/2012 Referred for  SNF Placement   Other Referral:   Interview type:  Family Other interview type:   CSW completed assessment with pt son Leslie Maduro in pt room.    PSYCHOSOCIAL DATA Living Status:  WITH ADULT CHILDREN Admitted from facility:   Level of care:   Primary support name:  [name] Primary support relationship to patient:  CHILD, ADULT Degree of support available:   Pt lives with pt son [name] however son has reported being overwhelmed and is agreeable to placement for pt.    CURRENT CONCERNS Current Concerns  Post-Acute Placement   Other Concerns:    SOCIAL WORK ASSESSMENT / PLAN CSW completed assessment with son who is agreeable to placement for pt.   Assessment/plan status:  Psychosocial Support/Ongoing Assessment of Needs Other assessment/ plan:   Information/referral to community resources:    PATIENT'S/FAMILY'S RESPONSE TO PLAN OF CARE: Pt presents with dementia and therefore pt son is pt main Management consultant. Son agreeable to placement and prefers placement at [facility].        Theresia Bough, MSW, Theresia Majors [phone]

## 2012-04-02 NOTE — Progress Notes (Signed)
Patient ID: [name], female   DOB: 02-07-1926, 78 y.o.   MRN: [id number] PGY-1 Daily Progress Note Family Medicine Teaching Service Anna Livers M. Leeah Politano, MD Service Pager: [phone]  Subjective: Patient OOB in chair with PT. Still complains of pain, but no other complaints.  Objective: Vital signs in last 24 hours: Temp:  [97.8 F (36.6 C)-98.1 F (36.7 C)] 98.1 F (36.7 C) (04/22 0900) Pulse Rate:  [64-70] 66  (04/22 0900) Resp:  [16-20] 18  (04/22 0900) BP: (119-151)/(40-71) 151/71 mmHg (04/22 0900) SpO2:  [91 %-99 %] 92 % (04/22 0900) Weight:  [113 lb (51.256 kg)] 113 lb (51.256 kg) (04/21 2045) Weight change: -7 lb (-3.175 kg) Last BM Date: 03/27/12  Intake/Output from previous day: 04/21 0701 - 04/22 0700 In: 290 [P.O.:290] Out: 100 [Urine:100] Intake/Output this shift:   Physical Exam: Gen: Sitting up in chair, awake, very pleasant. CV: RRR, no murmurs Pulm: CTAB Left Hip: dressing c/d/i, no surround erythema, mildly tender to touch. Some edema of LLE Neuro: Oriented to person only.  Lab Results:  [facility] 04/02/12 0640 04/01/12 0602  WBC 6.8 8.0  HGB 7.3* 7.1*  HCT 22.3* 21.4*  PLT 245 182   BMET  Basename 04/02/12 0640 04/01/12 0602  NA 133* 137  K 4.8 5.6*  CL 100 104  CO2 25 25  GLUCOSE 95 111*  BUN 31* 30*  CREATININE 1.48* 1.47*  CALCIUM 8.8 8.4    Studies/Results: No results found.  Medications:  I have reviewed the patient's current medications. Scheduled:    . acetaminophen  500 mg Oral Q4H  . antiseptic oral rinse  15 mL Mouth Rinse BID  . calcitRIOL  0.25 mcg Oral Daily  . docusate sodium  100 mg Oral BID  . donepezil  10 mg Oral QPC breakfast  . enoxaparin  30 mg Subcutaneous Q24H  . memantine  10 mg Oral BID  . senna  1 tablet Oral BID  . sodium bicarbonate  650 mg Oral QID  . vitamin B-12  1,000 mcg Oral Q M,W,F   Continuous:   JYN:WGNFAOZHYQMVH, alum & mag hydroxide-simeth, HYDROcodone-acetaminophen,  menthol-cetylpyridinium, methocarbamol (ROBAXIN) IV, methocarbamol, metoCLOPramide (REGLAN) injection, metoCLOPramide, morphine, ondansetron (ZOFRAN) IV, ondansetron, phenol, polyethylene glycol, zolpidem  Assessment/Plan: 78 yo F with dementia presenting with left hip fracture s/p fall. Went to OR on 03/29/12 for total hip replacement.  # Left hip fracture- Seen on Xray and CT scan. Ortho has been consulted. Patient and her family opted out of surgery at admission, but changed their plan of care and therefore patient went to surgery on 03/29/12 - Post-op day #4 from [facility]. Patient doing well. - Pain control with scheduled Tylenol 500mg  q4 hours. On calcitriol at home. Morphine and Norco q4 prn for better pain control - Continue Lovenox (adjusted for creat clearance)  - We appreciate Dr. Shelba Flake consult and excellent care of this patient. Will continue to follow up post-op recommendations  # S/P fall- Patient denies any symptoms surrounding fall. Denies LOC.  - Monitored on telemetry with no events. (Tele has been discontinued) - Patient had impact to chin as well. Xrays negative. Healing well.  # Dementia- Followed by neurology, on medications at home. Stable at this time, and has been improving in recent months per son's report  - Continue Namenda and Aricept   # CKD- Followed as an outpatient by nephrology. Creat 1.76 at admission, improved. - Received IVF at admission.   - Hold home Lasix for now  - Avoid  nephrotoxic agents   # Anemia: Chronic anemia due to CKD with acute blood loss anemia. - Hgb stable at 7.3 this morning - transfusion threshold <7 or if patient becomes symptomatic  # FEN/GI-Regular diet. Drinking fluids, will put IVF to [facility]  # PPx- Lovenox (adjusted for creat clearance)   # Dispo- Patient doing well post-op but continues to have pain. Family agreeable to short term SNF. Will await bed offers.  Lives with son, Elexis Pollak [phone].  Code status: Full   LOS:  8 days   Blakely Gluth 04/02/2012, 9:56 AM

## 2012-04-02 NOTE — Progress Notes (Signed)
Seen and examined.  Agree with Dr. Mikel Cella.  I am glad she is on scheduled tylenol.  Perhaps we can get her on less (tramadol versus hydrocodone) for breakthrough pain.  She has two iv's in.  We can take one or both out.  Clean up med list from her host of prns.  Medically stable.  Plan is for SNF for short term rehab before returning home.

## 2012-04-02 NOTE — Progress Notes (Signed)
Physical Therapy Treatment Patient Details Name: [name] MRN: [id number] DOB: 1926-06-07 Today's Date: 04/02/2012 Time: 4098-1191 PT Time Calculation (min): 28 min  PT Assessment / Plan / Recommendation Comments on Treatment Session  continuing improvements in mobility    Follow Up Recommendations  Skilled nursing facility    Equipment Recommendations  Defer to next venue    Frequency Min 6X/week   Plan Discharge plan remains appropriate    Precautions / Restrictions Precautions Precautions: Posterior Hip;Fall Precaution Booklet Issued: Yes (comment) Precaution Comments: Posted posterior hip precautions and board, discussed w/RN [place] Required Braces or Orthoses: Other Brace/Splint Other Brace/Splint: bed pillow between knees when up in cahir Restrictions Weight Bearing Restrictions: Yes LLE Weight Bearing: Weight bearing as tolerated   Pertinent Vitals/Pain Reports 10/10 L hip pain with motion; RN notified and came in to give meds    Mobility  Bed Mobility Bed Mobility: Supine to Sit;Sitting - Scoot to Edge of Bed Supine to Sit: 1: +2 Total assist;With rails Supine to Sit: Patient Percentage: 50% Sitting - Scoot to Edge of Bed: 1: +1 Total assist;With rail Details for Bed Mobility Assistance: Continued need for step-by-step cues for safety and technique; close guard of LLE for post hip prec; Pt used rails well with cues; once sitting verbal and tactile cues for fully ukpright sitting econdary to antalgic lean Transfers Transfers: Sit to Stand;Stand to Sit Sit to Stand: 1: +2 Total assist;With upper extremity assist;From bed Sit to Stand: Patient Percentage: 60% Stand to Sit: 1: +2 Total assist;With armrests;To chair/3-in-1 Stand to Sit: Patient Percentage: 50% Details for Transfer Assistance: cues for safety and technique; reminders for positioning for posterior hip prec (pt will need reminders every time secondary to memory defecits); decreased control with stand  to sit; once standing, pt became incontinent of urine; was able to stand at least 5 to 8 minutes for cleaning up and hygeine; Will plan to go straight to bedside commode next time we get up, then will walk Ambulation/Gait Ambulation/Gait Assistance: 1: +2 Total assist Ambulation/Gait: Patient Percentage: 60 Ambulation Distance (Feet): 4 Feet Assistive device: Rolling walker Ambulation/Gait Assistance Details: Cues for sequence, and to use RW to unweigh painful LLE Gait Pattern: Step-to pattern;Decreased step length - right;Decreased step length - left;Decreased hip/knee flexion - left;Antalgic Gait velocity: decreased    Exercises Total Joint Exercises Ankle Circles/Pumps: AROM;Both;10 reps;Supine   PT Goals Acute Rehab PT Goals Time For Goal Achievement: 04/06/12 Potential to Achieve Goals: Good Pt will go Supine/Side to Sit: with min assist;with rail;with HOB not 0 degrees (comment degree) PT Goal: Supine/Side to Sit - Progress: Progressing toward goal Pt will go Sit to Stand: with min assist;with upper extremity assist PT Goal: Sit to Stand - Progress: Progressing toward goal Pt will go Stand to Sit: with min assist;with upper extremity assist PT Goal: Stand to Sit - Progress: Progressing toward goal Pt will Ambulate: 16 - 50 feet;with min assist;with rolling walker PT Goal: Ambulate - Progress: Goal set today  Visit Information  Last PT Received On: 04/02/12 Assistance Needed: +2    Subjective Data  Subjective: Agreeable to OOB   Cognition  Overall Cognitive Status: History of cognitive impairments - at baseline Arousal/Alertness: Awake/alert Behavior During Session: [facility] for tasks performed Cognition - Other Comments: Able to follow directions and cooperate with requests    Balance     End of Session PT - End of Session Equipment Utilized During Treatment: Gait belt Activity Tolerance: Patient tolerated treatment well Patient left:  in chair;with call bell/phone within  reach Nurse Communication: Mobility status;Precautions;Weight bearing status    Van Clines [facility] [place], [place] [phone]  04/02/2012, 10:44 AM

## 2012-04-02 NOTE — Progress Notes (Signed)
Clinical Child psychotherapist (CSW) spoke with pt and informed him that [facility] has not offered placement as of yet and that son will need to choose another facility as pt is stable for dc. Son understands and will contact CSW by noon with a placement decision. CSW will follow up and facilitate with dc today.  Theresia Bough, MSW, Theresia Majors [phone]

## 2012-04-02 NOTE — Discharge Instructions (Signed)
Hip Fracture (Upper Femoral Fracture) You have a hip fracture (break in bone). This is a fracture of the upper part of the big bone (femur, thigh bone) between your hip and knee. If your caregiver feels it is a stable fracture, occasionally it can be treated without surgery. Usually these fractures are unstable. This means that the bones will not heal properly without surgery. Surgery is necessary to hold the bones together in a good position where they will heal well. DIAGNOSIS A physical exam can determine if a fracture has occurred. X-ray studies are needed to see what type of fracture is present and to look for other injuries. These studies will help your caregiver determine what the best treatment is for you. If there is more than one option, your caregiver can give you the information needed to help you decide on the treatment. TREATMENT  The treatment for an unstable fracture is usually surgery. This means using a screw, nail, or rod to hold the bones in place.  RISKS AND COMPLICATIONS All surgery is associated with risks. Sometimes the implant may fail. Other complications of surgery include infection or the bones not healing properly. Sometimes the fracture may damage the blood supply to the head of the femur. That portion of bone may die (osteonecrosis or avascular necrosis). Sometimes to avoid this complication, an implant is used which just replaces the ball of the femur (hemi-arthroplasty or prosthetic replacement). Some of the other risks are:  Excessive bleeding.   Infection.   Dislocation if a hemi-arthroplasty or a total hip was inserted.   Failure to heal properly resulting in an unstable hip.   Stiffness of hip following repair.   On occasion, blood may have to be replaced before or during the procedure  LET YOUR CAREGIVERS KNOW ABOUT:  Allergies.   Medications taken including herbs, eye drops, over the counter medications, and creams.   Use of steroids (by mouth or  creams).   History of bleeding or blood problems.   History of serious infection.   Previous problems with anesthetics or novocaine.   Possibility of pregnancy, if this applies.   History of blood clots (thrombophlebitis).   Previous surgery.   Other health problems.  BEFORE THE PROCEDURE Before surgery, an IV (intravenous line connected to your vein) may be started. You will be given an anesthetic (medications and gas to make you sleep) or given medications in your back to make you numb from the waist down (spinal anesthetic). AFTER THE PROCEDURE After surgery, you will be taken to the recovery area where a nurse will watch your progress. You may have a catheter (a long, narrow, hollow tube) in your bladder that helps you pass your water. Once you're awake, stable, and taking fluids well, you will be returned to your room. You will receive physical therapy and other care until you are doing well and your caregiver feels it is safe for you to be transferred either to home or to an extended care facility. Your activity level will change as your caregiver determines what is best for you.  You may resume normal diet and activities as directed or allowed.   Change dressings if necessary or as directed.   Only take over-the-counter or prescription medicines for pain, discomfort, or fever as directed by your caregiver.   You may be placed on blood thinners for 4-6 weeks to prevent blood clots.  SEEK IMMEDIATE MEDICAL CARE IF:  There is swelling of your calf or leg.   You  IF:   There is swelling of your calf or leg.   You have shortness of breath or chest pain.   There is redness, swelling, or increasing pain in the wound.   There is pus coming from wound.   You have an unexplained oral temperature above 102 F (38.9 C).   There is a foul (bad) smell coming from the wound or dressing.   There is a breaking open of the wound (edges not staying together) after sutures or staples have been removed.   There is a marked increase in pain or shortening of  the leg.   You have severe pain anywhere in the leg.   There is any change in color or temperature of your leg below the injury.  MAKE SURE YOU:    Understand these instructions.   Will watch your condition.   Will get help right away if you are not doing well or get worse.  Document Released: 11/28/2005 Document Revised: 11/17/2011 Document Reviewed: 07/18/2008  [employer] Patient Information 2012 [employer].

## 2012-04-02 NOTE — Progress Notes (Signed)
Patient ID: LAMEES GABLE, female   DOB: 1926/12/06, 78 y.o.   MRN: [id number]     Subjective:  Patient reports pain as mild to moderate.  She states that her hip feels better. She denies CP or SOB however there is no family at the bedside and the patient is not aware of time or place.  She is able to follow commands and report which side is the involved side.   Objective:   VITALS:   Filed Vitals:   04/01/12 1758 04/01/12 2000 04/01/12 2045 04/02/12 0420  BP: 149/56  127/46 119/40  Pulse: 68  64 70  Temp: 97.9 F (36.6 C)  97.8 F (36.6 C) 97.8 F (36.6 C)  TempSrc: Oral  Oral Oral  Resp: 18 16 18 18   Height:      Weight:   51.256 kg (113 lb)   SpO2: 98% 97% 97% 97%    ABD soft Sensation intact distally Dorsiflexion/Plantar flexion intact Incision: dressing C/D/I and no drainage  LABS  Results for orders placed during the hospital encounter of 03/25/12 (from the past 24 hour(s))  CBC     Status: Abnormal   Collection Time   04/02/12  6:40 AM      Component Value Range   WBC 6.8  4.0 - 10.5 (K/uL)   RBC 2.29 (*) 3.87 - 5.11 (MIL/uL)   Hemoglobin 7.3 (*) 12.0 - 15.0 (g/dL)   HCT 86.5 (*) 78.4 - 46.0 (%)   MCV 97.4  78.0 - 100.0 (fL)   MCH 31.9  26.0 - 34.0 (pg)   MCHC 32.7  30.0 - 36.0 (g/dL)   RDW 69.6  29.5 - 28.4 (%)   Platelets 245  150 - 400 (K/uL)  GLUCOSE, CAPILLARY     Status: Normal   Collection Time   04/02/12  7:36 AM      Component Value Range   Glucose-Capillary 91  70 - 99 (mg/dL)    No results found.  Assessment/Plan: 4 Days Post-Op   Principal Problem:  *Fracture, intertrochanteric, left femur Active Problems:  Fall from standing  Dementia   Advance diet Up with therapy ABLA, clinically seems asymptomatic, but will defer decision for transfusion to primary team.   Continue plan per medicine. Ok for SNF when OK per medicine  Haskel Khan 04/02/2012, 8:10 AM   Teryl Lucy, MD [phone] pager

## 2012-04-05 ENCOUNTER — Telehealth: Payer: Self-pay

## 2012-04-05 NOTE — Telephone Encounter (Signed)
.  umfc The patient's son called to see if his mother's PNA vaccine was done last year.  Please call at [phone].

## 2012-04-06 NOTE — Telephone Encounter (Signed)
LMOM to call back

## 2012-04-06 NOTE — Telephone Encounter (Signed)
Spoke with son and let him know that she did not have a pneumonia vaccine last year.

## 2012-05-04 ENCOUNTER — Telehealth: Payer: Self-pay

## 2012-05-04 NOTE — Telephone Encounter (Signed)
Son would like to remove mom from the stool softner due to diarrhea Would like opinion from the provider if this is okay?

## 2012-05-05 NOTE — Telephone Encounter (Signed)
Does she have a stomach virus? If she is having diarrhea, they should definitely hold the stool softener until the diarrhea resolves

## 2012-05-05 NOTE — Telephone Encounter (Signed)
Called mail box is full

## 2012-05-07 NOTE — Telephone Encounter (Signed)
Called son back, who first stated "Whats going on?" . When I explained that I was getting back in touch w/him concerning his question about his mother, and that we had tried to reach him over the weekend, but his VM was full, he angrily stated that I had scared him to death. He is OOT and thought something had happened to his mother and why would we call him on ____ [facility] Day. I calmly stated we were just returning his call concerning pt. He angrily replied "can we just talk about this tomorrow? I can't believe you would call someone on [facility] Day!!!!"

## 2012-05-09 NOTE — Telephone Encounter (Signed)
Called son back and asked him is he still had any questions about his mother, and told him that if his mother has diarrhea he should hold the stool softener until it resolves. Son reports he doesn't think his mother has a virus, he thinks she takes too much laxatives/fiber supplements. Advised son that he should cut back on these to a point where pt's diarrhea resolves but she is still having regular bowel movements. Son stated that he had to go and take another call, but he will just ask the pharmacist if he has further ?s about this. Advised pt to CB if needed.

## 2012-06-15 ENCOUNTER — Other Ambulatory Visit: Payer: Medicare Other

## 2012-06-27 ENCOUNTER — Other Ambulatory Visit: Payer: Self-pay | Admitting: Family Medicine

## 2012-07-09 ENCOUNTER — Other Ambulatory Visit (HOSPITAL_COMMUNITY): Payer: Self-pay | Admitting: *Deleted

## 2012-07-10 ENCOUNTER — Inpatient Hospital Stay (HOSPITAL_COMMUNITY): Admission: RE | Admit: 2012-07-10 | Payer: Medicare Other | Source: Ambulatory Visit

## 2012-07-13 ENCOUNTER — Encounter (HOSPITAL_COMMUNITY)
Admission: RE | Admit: 2012-07-13 | Discharge: 2012-07-13 | Disposition: A | Discharge status: Home / Self Care | Payer: Medicare Other | Source: Ambulatory Visit | Attending: Nephrology | Admitting: Nephrology

## 2012-07-13 DIAGNOSIS — D638 Anemia in other chronic diseases classified elsewhere: Secondary | ICD-10-CM | POA: Insufficient documentation

## 2012-07-13 DIAGNOSIS — N184 Chronic kidney disease, stage 4 (severe): Secondary | ICD-10-CM | POA: Insufficient documentation

## 2012-07-13 LAB — IRON AND TIBC
Iron: 39 ug/dL — ABNORMAL LOW (ref 42–135)
UIBC: 220 ug/dL (ref 125–400)

## 2012-07-13 LAB — POCT HEMOGLOBIN-HEMACUE: Hemoglobin: 13.6 g/dL (ref 12.0–15.0)

## 2012-07-13 LAB — FERRITIN: Ferritin: 364 ng/mL — ABNORMAL HIGH (ref 10–291)

## 2012-07-13 MED ORDER — EPOETIN ALFA 10000 UNIT/ML IJ SOLN: 20000.0000 [IU] | INTRAMUSCULAR | Status: DC

## 2012-07-16 ENCOUNTER — Encounter: Payer: Self-pay | Admitting: Family Medicine

## 2012-07-17 ENCOUNTER — Telehealth: Payer: Self-pay | Admitting: Pulmonary Disease

## 2012-07-17 NOTE — Telephone Encounter (Signed)
Called pt to schedule follow up apt x3.Sent letter 07/09/12. ° °

## 2012-07-24 ENCOUNTER — Encounter (HOSPITAL_COMMUNITY): Payer: Medicare Other

## 2012-08-07 ENCOUNTER — Encounter (HOSPITAL_COMMUNITY): Payer: Medicare Other

## 2012-08-08 ENCOUNTER — Telehealth: Payer: Self-pay | Admitting: Pulmonary Disease

## 2012-08-08 DIAGNOSIS — R911 Solitary pulmonary nodule: Secondary | ICD-10-CM

## 2012-08-08 NOTE — Telephone Encounter (Signed)
LMTCB-will need to forward message to RA to ask; RA back in the office on 08-09-12.

## 2012-08-09 ENCOUNTER — Other Ambulatory Visit (HOSPITAL_COMMUNITY): Payer: Self-pay | Admitting: *Deleted

## 2012-08-09 NOTE — Telephone Encounter (Signed)
According to last OV note pt was to f/u in 6 months with repeat CT which was ordered and scheduled but according to epic pt no show or cancelled the appt. I will reorder the CT to have done before appt on 09/10/12. Pt is aware.  Carron Curie, CMA

## 2012-08-10 ENCOUNTER — Encounter (HOSPITAL_COMMUNITY): Payer: Medicare Other

## 2012-08-21 ENCOUNTER — Encounter (HOSPITAL_COMMUNITY): Payer: Medicare Other

## 2012-08-22 ENCOUNTER — Encounter (HOSPITAL_COMMUNITY)
Admission: RE | Admit: 2012-08-22 | Discharge: 2012-08-22 | Disposition: A | Discharge status: Home / Self Care | Payer: Medicare Other | Source: Ambulatory Visit | Attending: Nephrology | Admitting: Nephrology

## 2012-08-22 DIAGNOSIS — N184 Chronic kidney disease, stage 4 (severe): Secondary | ICD-10-CM | POA: Insufficient documentation

## 2012-08-22 DIAGNOSIS — D638 Anemia in other chronic diseases classified elsewhere: Secondary | ICD-10-CM | POA: Insufficient documentation

## 2012-08-22 LAB — IRON AND TIBC
Iron: 82 ug/dL (ref 42–135)
UIBC: 173 ug/dL (ref 125–400)

## 2012-08-22 MED ORDER — FERUMOXYTOL INJECTION 510 MG/17 ML
INTRAVENOUS | Status: AC
  Filled 2012-08-22: qty 17

## 2012-08-22 MED ORDER — EPOETIN ALFA 10000 UNIT/ML IJ SOLN
20000.0000 [IU] | INTRAMUSCULAR | Status: DC
  Administered 2012-08-22: 10000 [IU] via SUBCUTANEOUS
  Filled 2012-08-22: qty 1

## 2012-08-22 MED ORDER — SODIUM CHLORIDE 0.9 % IV SOLN
INTRAVENOUS | Status: DC
  Administered 2012-08-22: 12:00:00 via INTRAVENOUS

## 2012-08-22 MED ORDER — FERUMOXYTOL INJECTION 510 MG/17 ML
510.0000 mg | Freq: Once | INTRAVENOUS | Status: AC
  Administered 2012-08-22: 510 mg via INTRAVENOUS

## 2012-09-05 ENCOUNTER — Other Ambulatory Visit: Payer: Medicare Other

## 2012-09-08 IMAGING — CT CT HIP*L* W/O CM
2 of 3 series · 17 of 46 positions shown, 19 images · non-contrast
Comparison: Radiographs same date.

CLINICAL DATA: Left hip pain status post fall.  Evaluate fracture.

CT OF THE LEFT HIP WITHOUT CONTRAST
TECHNIQUE: Multidetector CT imaging was performed according to the
standard protocol. Multiplanar CT image reconstructions were also
generated.

[Series 2: hip · axial · 0.45mm/px · z∈[-225,-55]mm · 14 of 78 slices shown, 16 images]
[im 5/78  soft-tissue]
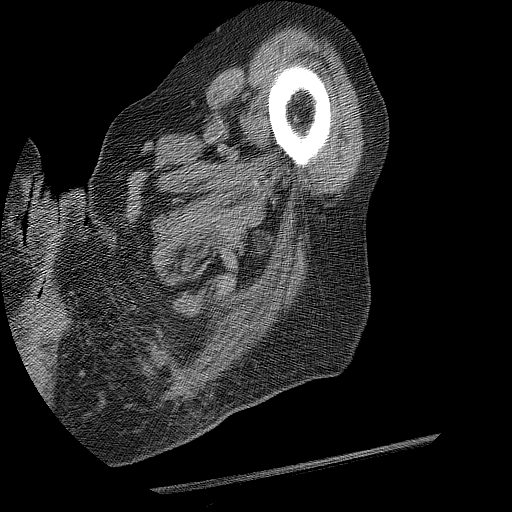
[im 5/78  bone]
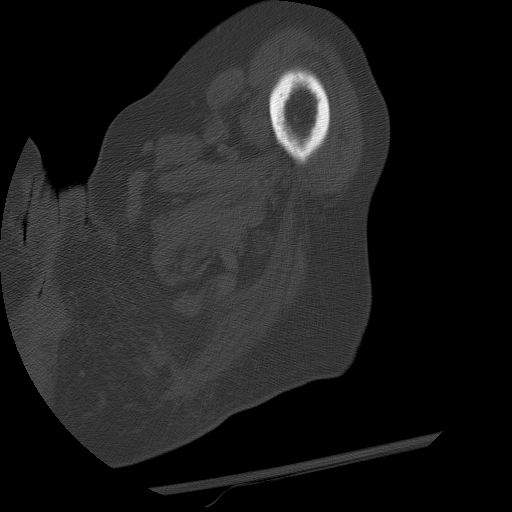
[im 10/78  soft-tissue]
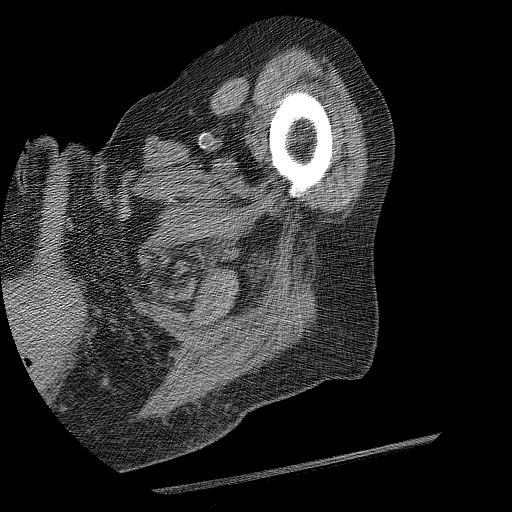
[im 15/78  soft-tissue]
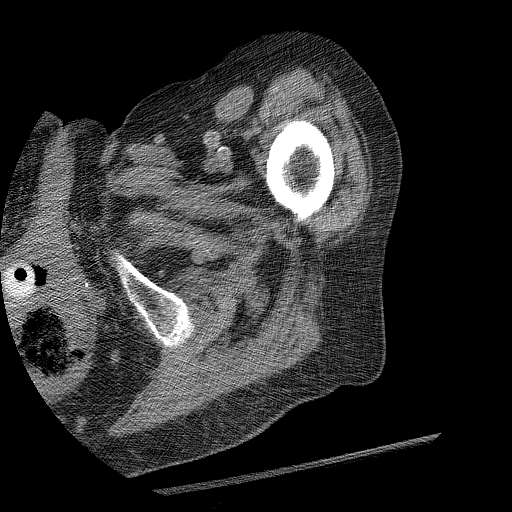
[im 20/78  soft-tissue]
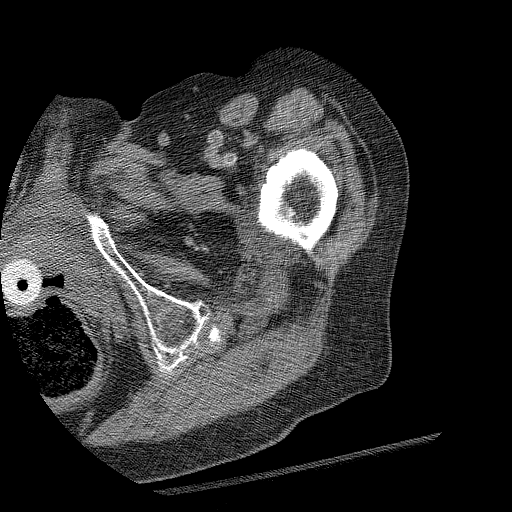
[im 25/78  soft-tissue]
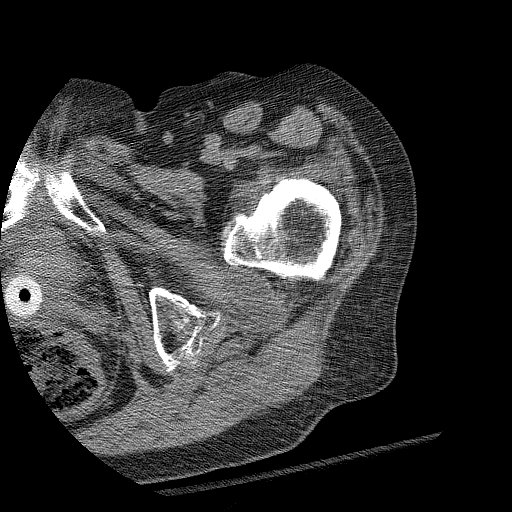
[im 30/78  soft-tissue]
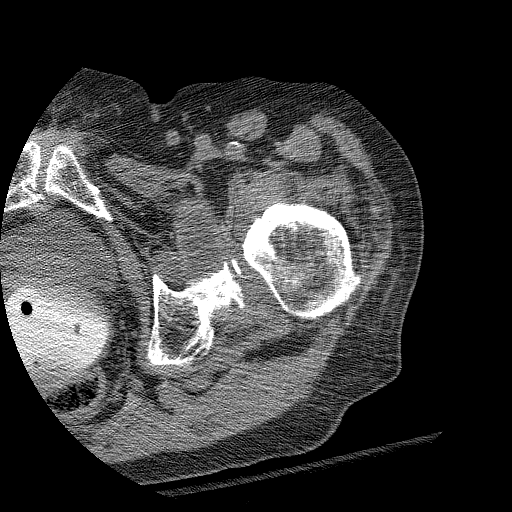
[im 35/78  soft-tissue]
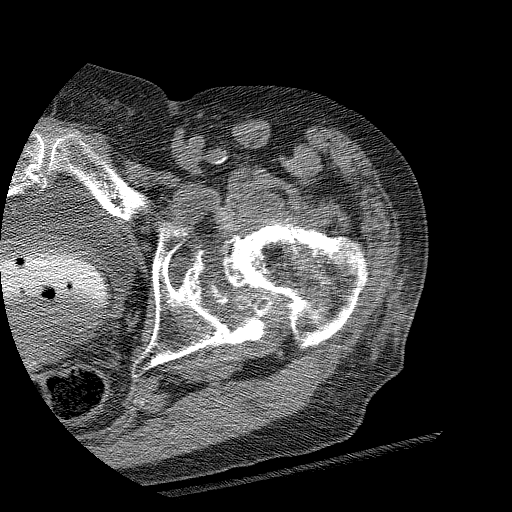
[im 43/78  soft-tissue]
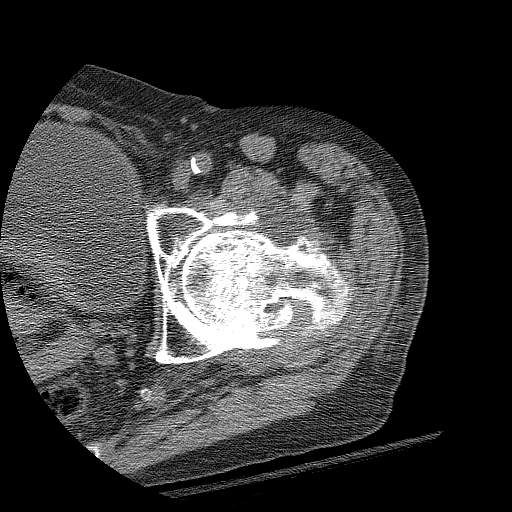
[im 48/78  soft-tissue]
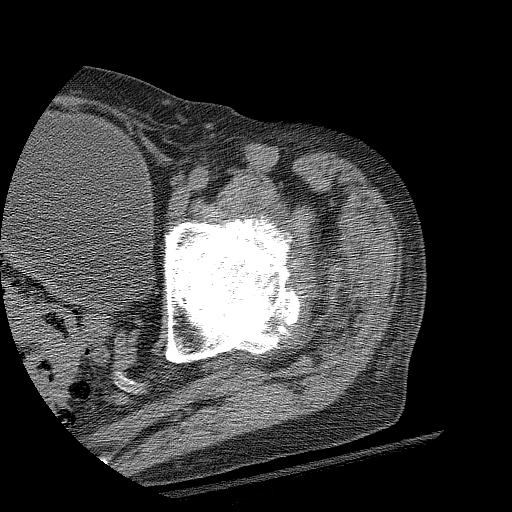
[im 48/78  bone]
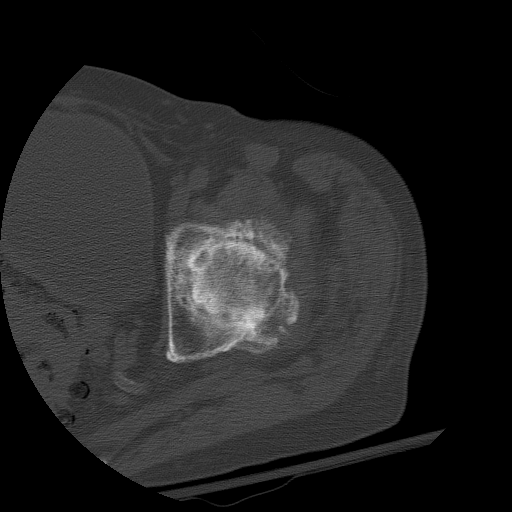
[im 53/78  soft-tissue]
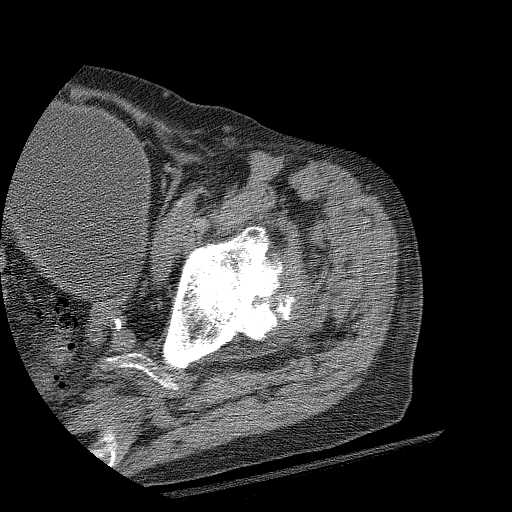
[im 58/78  soft-tissue]
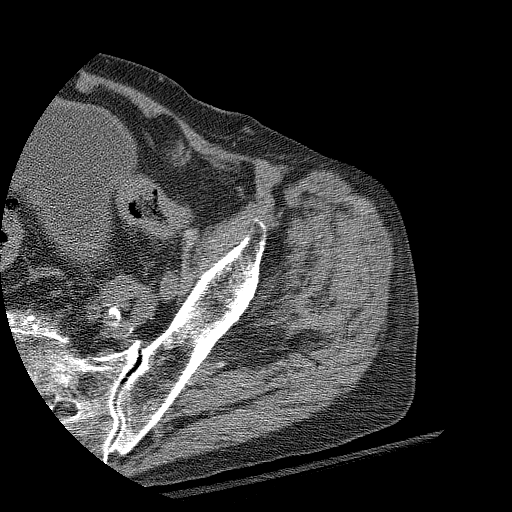
[im 63/78  soft-tissue]
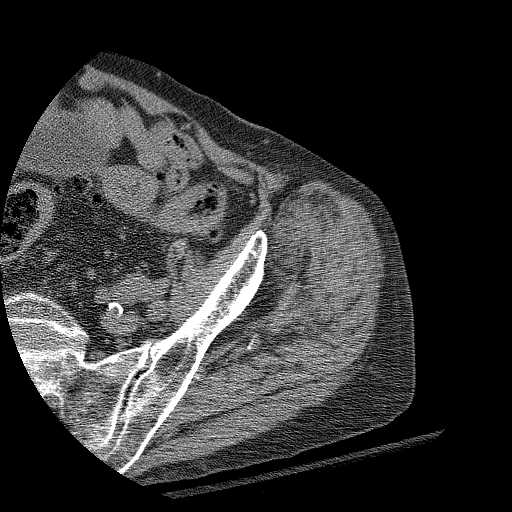
[im 68/78  soft-tissue]
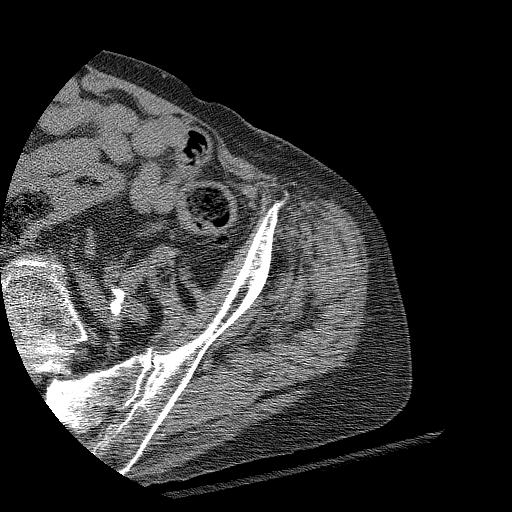
[im 73/78  soft-tissue]
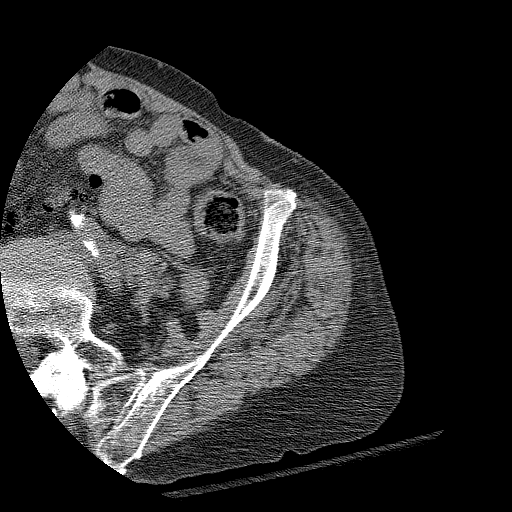

[Series 400: mpr · coronal · 0.45mm/px · 3 of 74 slices shown]
[im 25/74  soft-tissue]
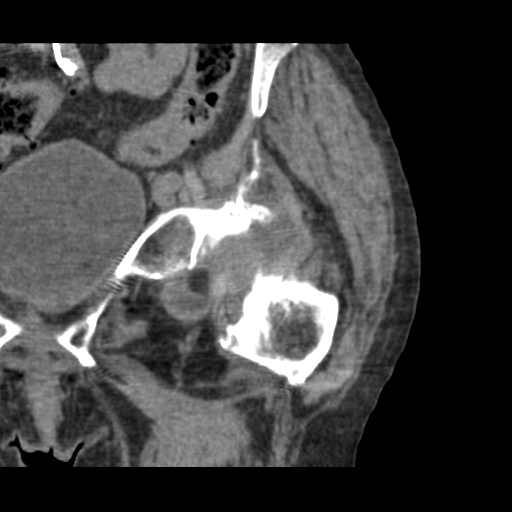
[im 33/74  soft-tissue]
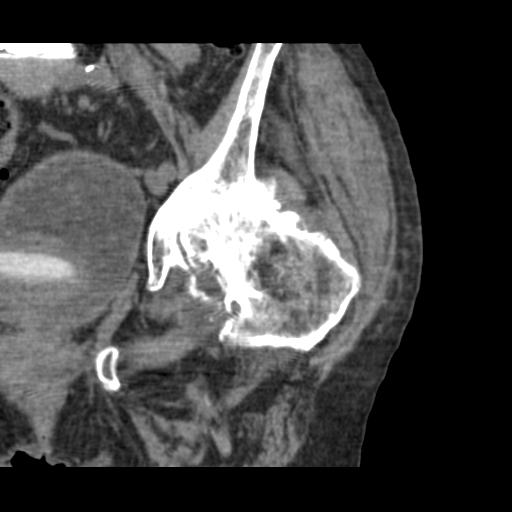
[im 41/74  soft-tissue]
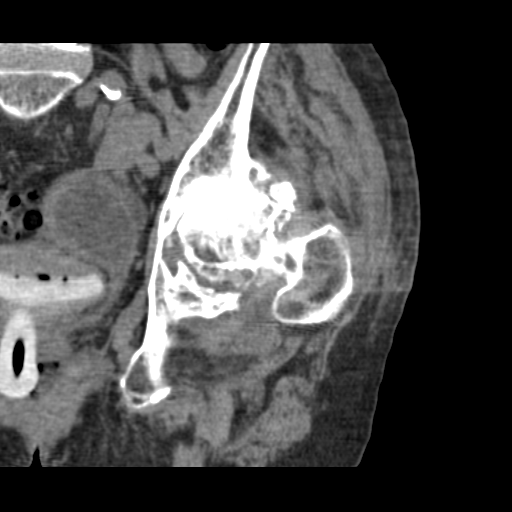

[17 of 46 positions shown; findings below may reference images not displayed]

FINDINGS: There is a nondisplaced intertrochanteric left femur
fracture.  This extends across the base of the femoral neck
medially.

The patient has severe underlying left hip osteoarthritis with
flattening of the femoral head and extensive osteophyte formation.
There are multiple intra-articular loose bodies.  There is a
moderate sized hip joint effusion.  Fluid extends into the
iliopsoas bursa.

No acute fractures are seen within the visualized left hemi pelvis.
An old fracture of the right pubic ramus is partially imaged.
There are degenerative changes at the left sacroiliac joint.
Vaginal support device noted.
IMPRESSION: 1.  Nondisplaced intertrochanteric left femur fracture extending
into the basicervical region medially.
2.  Underlying severe left hip osteoarthritis with multiple loose
bodies.

## 2012-09-10 ENCOUNTER — Ambulatory Visit: Payer: Medicare Other | Admitting: Pulmonary Disease

## 2012-09-12 IMAGING — CR DG HIP 1V PORT*L*
1 series · 1 of 1 positions shown · non-contrast
Comparison: Pelvis CT 03/25/2012.

CLINICAL DATA: Status post left hip arthroplasty.

PORTABLE LEFT HIP - 1 VIEW

[cross table lat hip]
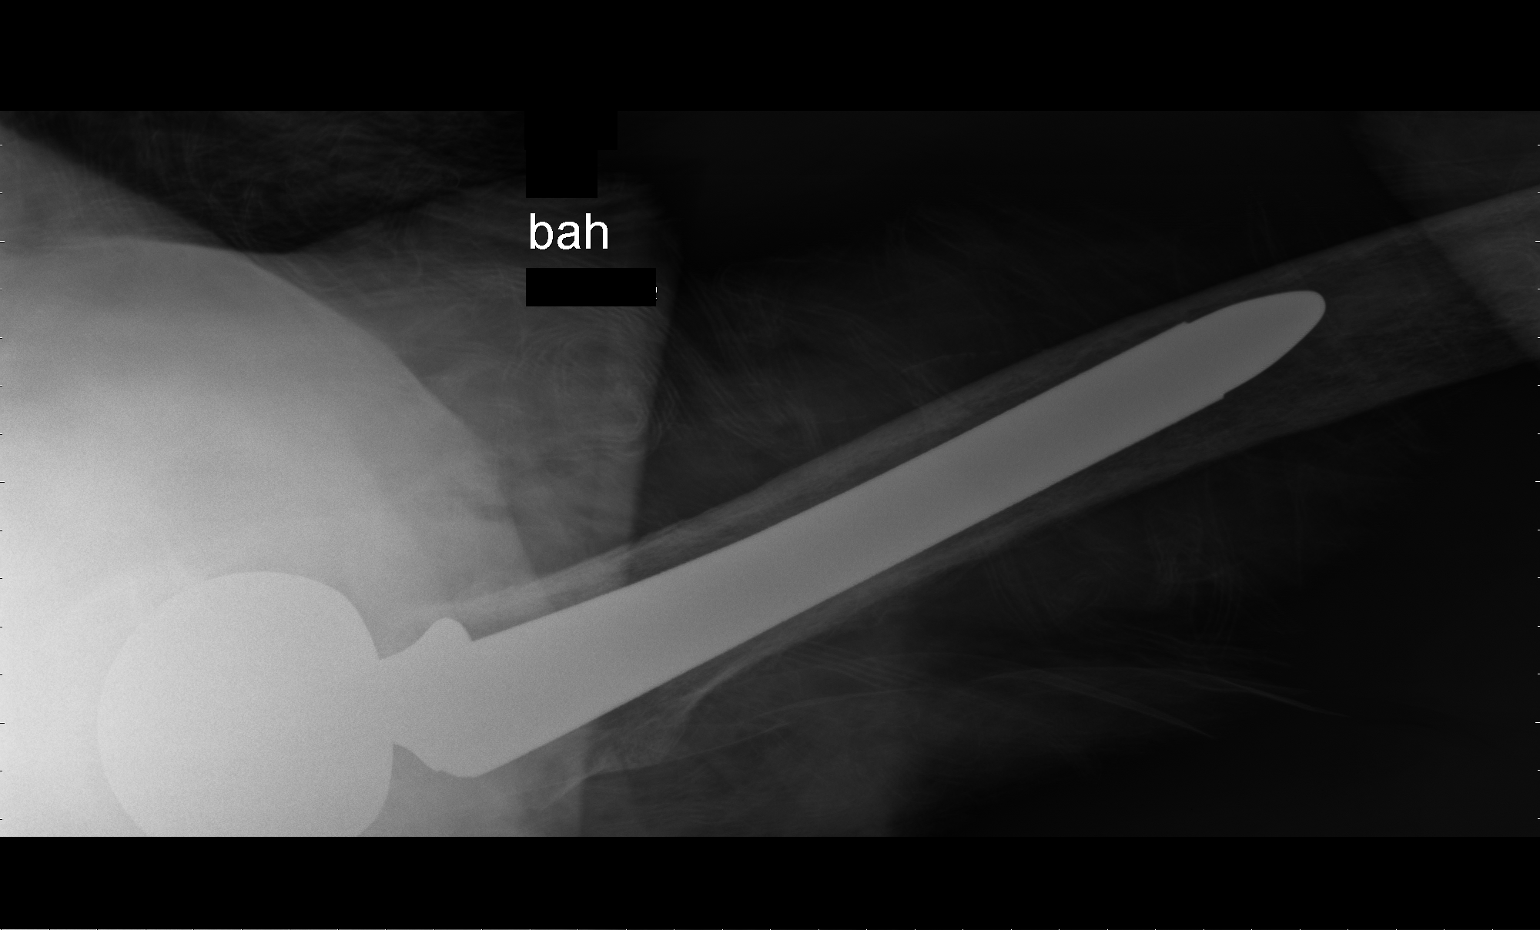

[1 of 1 positions shown; findings below may reference images not displayed]

FINDINGS: A cross-table lateral view confirms that the left hip is
located.  The femoral component is well seated.
IMPRESSION: Status post left hip hemiarthroplasty without radiographic evidence
for complication.

## 2012-09-12 IMAGING — CR DG PORTABLE PELVIS
1 series · 1 of 1 positions shown · non-contrast
Comparison: CT of the pelvis 03/25/2012.

CLINICAL DATA: Status post left hip arthroplasty.

PORTABLE PELVIS

[AP]
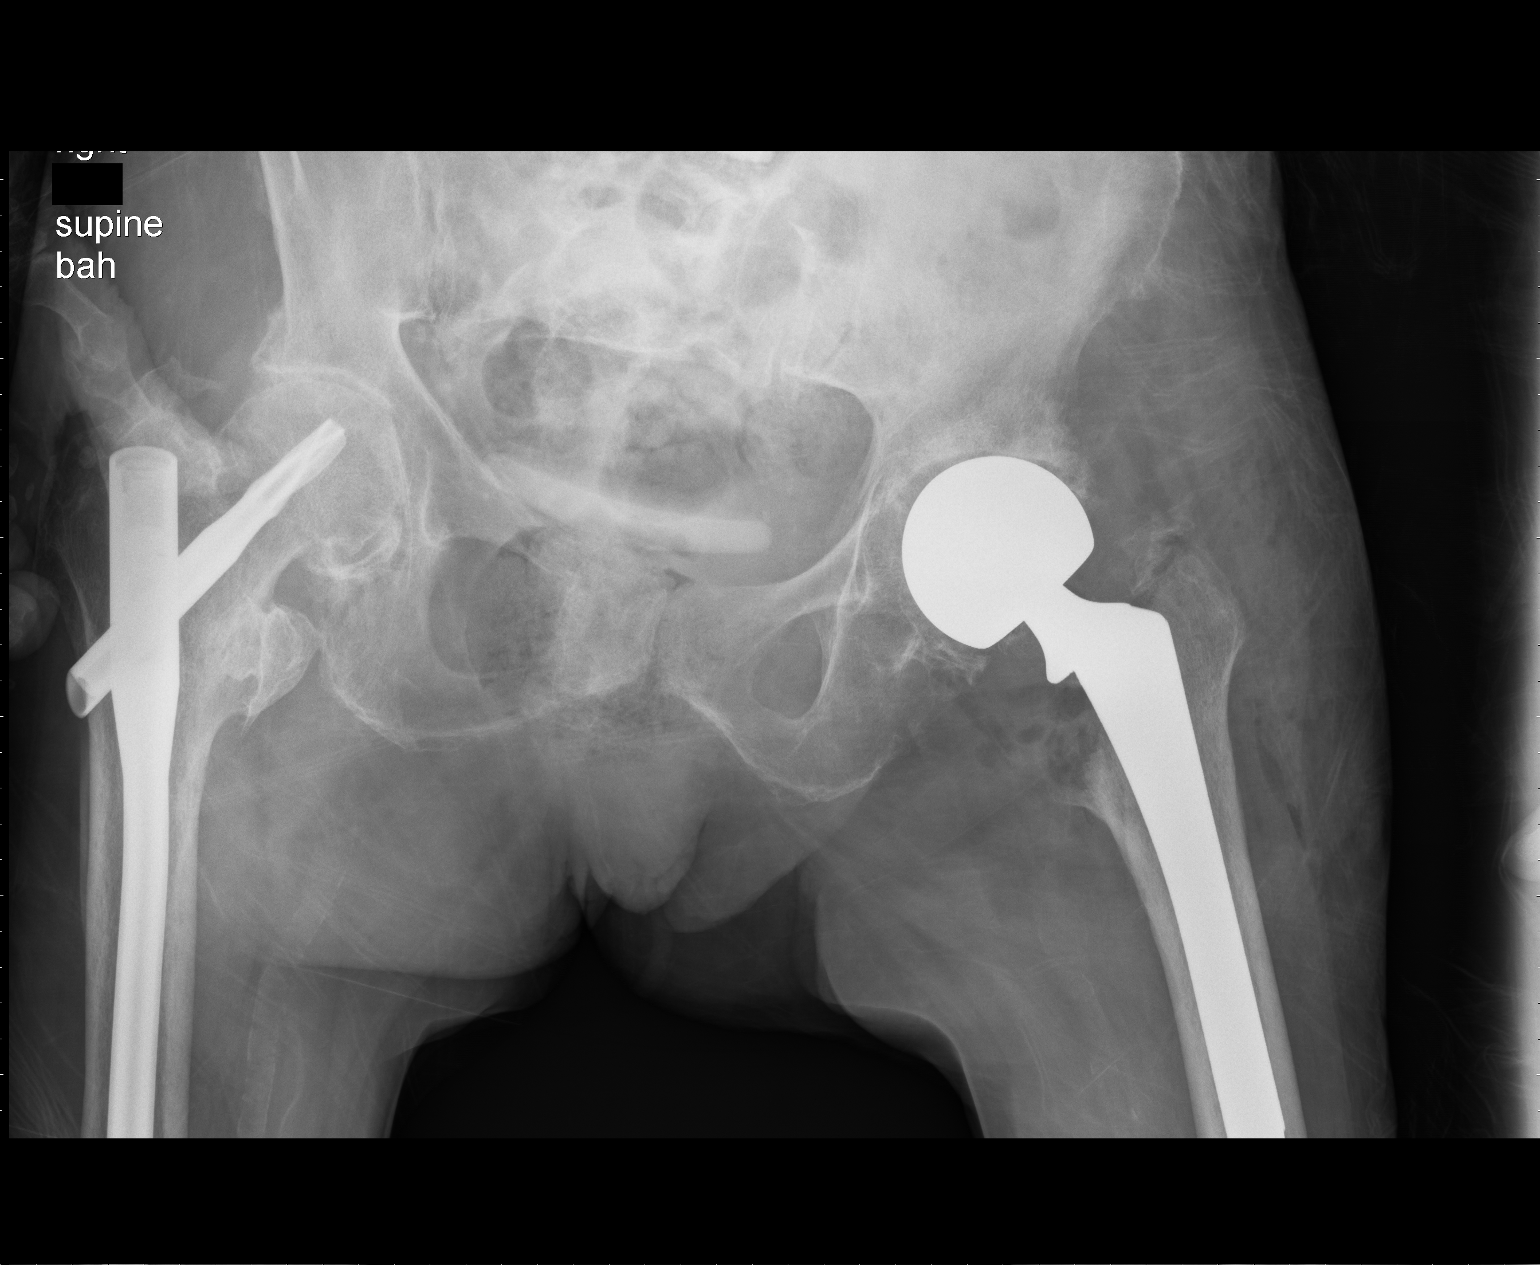

[1 of 1 positions shown; findings below may reference images not displayed]

FINDINGS: The patient is status post left hip hemiarthroplasty.
The prosthesis is located.  Sclerotic changes are noted in the
acetabulum.  The femoral component is well seated.

The patient is status post ORIF of the right hip.  A proximal
femoral nail is evident.  Degenerative changes in the right hip are
less severe than on the left.  Vertebral augmentation is evident at
L5.  The SI joints are fused bilaterally.
IMPRESSION: 1.  Status post left hip hemiarthroplasty without radiographic
evidence for complication.

## 2012-09-18 ENCOUNTER — Other Ambulatory Visit (HOSPITAL_COMMUNITY): Payer: Self-pay | Admitting: *Deleted

## 2012-09-19 ENCOUNTER — Encounter (HOSPITAL_COMMUNITY): Payer: Medicare Other

## 2012-09-24 ENCOUNTER — Encounter (HOSPITAL_COMMUNITY): Payer: Medicare Other

## 2012-10-01 ENCOUNTER — Encounter (HOSPITAL_COMMUNITY)
Admission: RE | Admit: 2012-10-01 | Discharge: 2012-10-01 | Disposition: A | Discharge status: Home / Self Care | Payer: Medicare Other | Source: Ambulatory Visit | Attending: Nephrology | Admitting: Nephrology

## 2012-10-01 DIAGNOSIS — N184 Chronic kidney disease, stage 4 (severe): Secondary | ICD-10-CM | POA: Insufficient documentation

## 2012-10-01 DIAGNOSIS — D638 Anemia in other chronic diseases classified elsewhere: Secondary | ICD-10-CM | POA: Insufficient documentation

## 2012-10-01 LAB — IRON AND TIBC
Saturation Ratios: 39 % (ref 20–55)
TIBC: 244 ug/dL — ABNORMAL LOW (ref 250–470)
UIBC: 149 ug/dL (ref 125–400)

## 2012-10-01 LAB — POCT HEMOGLOBIN-HEMACUE: Hemoglobin: 12.3 g/dL (ref 12.0–15.0)

## 2012-10-01 LAB — FERRITIN: Ferritin: 644 ng/mL — ABNORMAL HIGH (ref 10–291)

## 2012-10-01 MED ORDER — EPOETIN ALFA 10000 UNIT/ML IJ SOLN: 10000.0000 [IU] | INTRAMUSCULAR | Status: DC

## 2012-10-12 ENCOUNTER — Telehealth: Payer: Self-pay | Admitting: Pulmonary Disease

## 2012-10-12 NOTE — Telephone Encounter (Signed)
Spoke to pt son and he made me aware that her appointment had been moved from 10/16/12 to 11/15/12. The order that is in the system was entered in by [facility] [place] back in august and doesn't expire until 10/2013. I will get a [facility] to schedule this for them.

## 2012-10-15 ENCOUNTER — Encounter (HOSPITAL_COMMUNITY): Payer: Medicare Other

## 2012-10-15 NOTE — Telephone Encounter (Signed)
Pt's son is dealing with rose in ct about this ct appt for same day ov Tobe Sos

## 2012-10-16 ENCOUNTER — Ambulatory Visit: Payer: Medicare Other | Admitting: Pulmonary Disease

## 2012-10-29 ENCOUNTER — Encounter (HOSPITAL_COMMUNITY): Payer: Medicare Other

## 2012-11-05 ENCOUNTER — Encounter (HOSPITAL_COMMUNITY)
Admission: RE | Admit: 2012-11-05 | Discharge: 2012-11-05 | Disposition: A | Discharge status: Home / Self Care | Payer: Medicare Other | Source: Ambulatory Visit | Attending: Nephrology | Admitting: Nephrology

## 2012-11-05 DIAGNOSIS — D638 Anemia in other chronic diseases classified elsewhere: Secondary | ICD-10-CM | POA: Insufficient documentation

## 2012-11-05 DIAGNOSIS — N184 Chronic kidney disease, stage 4 (severe): Secondary | ICD-10-CM | POA: Insufficient documentation

## 2012-11-05 MED ORDER — EPOETIN ALFA 10000 UNIT/ML IJ SOLN: 10000.0000 [IU] | INTRAMUSCULAR | Status: DC

## 2012-11-12 ENCOUNTER — Ambulatory Visit: Payer: Medicare Other | Admitting: Pulmonary Disease

## 2012-11-15 ENCOUNTER — Ambulatory Visit: Payer: Medicare Other | Admitting: Pulmonary Disease

## 2012-11-15 ENCOUNTER — Other Ambulatory Visit: Payer: Medicare Other

## 2012-11-29 ENCOUNTER — Encounter (HOSPITAL_COMMUNITY)
Admission: RE | Admit: 2012-11-29 | Discharge: 2012-11-29 | Disposition: A | Discharge status: Home / Self Care | Payer: Medicare Other | Source: Ambulatory Visit | Attending: Nephrology | Admitting: Nephrology

## 2012-11-29 DIAGNOSIS — D638 Anemia in other chronic diseases classified elsewhere: Secondary | ICD-10-CM | POA: Insufficient documentation

## 2012-11-29 DIAGNOSIS — N184 Chronic kidney disease, stage 4 (severe): Secondary | ICD-10-CM | POA: Insufficient documentation

## 2012-11-29 LAB — POCT HEMOGLOBIN-HEMACUE: Hemoglobin: 11.9 g/dL — ABNORMAL LOW (ref 12.0–15.0)

## 2012-11-29 MED ORDER — EPOETIN ALFA 10000 UNIT/ML IJ SOLN
10000.0000 [IU] | INTRAMUSCULAR | Status: DC
  Administered 2012-11-29: 10000 [IU] via SUBCUTANEOUS

## 2012-11-29 MED ORDER — EPOETIN ALFA 10000 UNIT/ML IJ SOLN
INTRAMUSCULAR | Status: AC
  Filled 2012-11-29: qty 1

## 2012-11-30 ENCOUNTER — Other Ambulatory Visit (HOSPITAL_COMMUNITY): Payer: Self-pay | Admitting: *Deleted

## 2012-12-03 ENCOUNTER — Encounter (HOSPITAL_COMMUNITY): Payer: Medicare Other

## 2012-12-26 ENCOUNTER — Telehealth: Payer: Self-pay | Admitting: *Deleted

## 2012-12-26 NOTE — Telephone Encounter (Signed)
Pharmacy requesting refill on donepezil 10mg . Last fill 05/02/12

## 2012-12-27 ENCOUNTER — Telehealth: Payer: Self-pay | Admitting: Radiology

## 2012-12-27 NOTE — Telephone Encounter (Signed)
Patients son was advised Aricept should come from her PCP, Dr Jacky Kindle. Patients son states Dr Milus Glazier is PCP and they need the Aricept sent to [facility]. Please advise.

## 2012-12-27 NOTE — Telephone Encounter (Signed)
[facility] and they reported that the Rx was originally written by a short term provider. They had sent a request to pt's PCP, Dr Jacky Kindle, and it was denied stating she needs an OV. She reported that pt also needs Aricept and a couple of other medications that were not originally written by [place], but that pt's son asked them to send request to Dr L. I denied RFs bc they should go through pt's PCP. Pharmacy stated they will be glad to let the son know.

## 2012-12-27 NOTE — Telephone Encounter (Signed)
I do not think that we prescribe this for the patient.

## 2012-12-28 ENCOUNTER — Telehealth: Payer: Self-pay | Admitting: Family Medicine

## 2012-12-28 ENCOUNTER — Other Ambulatory Visit (HOSPITAL_COMMUNITY): Payer: Self-pay | Admitting: *Deleted

## 2012-12-28 NOTE — Telephone Encounter (Signed)
patients son called and wanted to know about status of Aricept RX. I notified him of previous message regarding this from Dr. Milus Glazier. He states she no longer see Dr. Jacky Kindle. He is no longer her PCP. She hasn't seen him in 2 years and stated Dr. Milus Glazier is her PCP. The pharmacy gave him 2 pills 1 for Saturday and 1 for Sunday so she is not without. He is bringing his mother to see Dr. Milus Glazier Saturday or Sunday.

## 2012-12-28 NOTE — Telephone Encounter (Signed)
Called pharmacy again to advise. They will send again to Dr Jacky Kindle.

## 2012-12-28 NOTE — Telephone Encounter (Signed)
Aricept can have many adverse effects and it offers very little in the way of benefits.  Dr. Jacky Kindle is the appropriate person to approach, just as you have indicated.

## 2012-12-29 ENCOUNTER — Other Ambulatory Visit: Payer: Self-pay | Admitting: Family Medicine

## 2012-12-29 DIAGNOSIS — F0280 Dementia in other diseases classified elsewhere without behavioral disturbance: Secondary | ICD-10-CM

## 2012-12-29 DIAGNOSIS — G309 Alzheimer's disease, unspecified: Secondary | ICD-10-CM

## 2012-12-29 MED ORDER — DONEPEZIL HCL 10 MG PO TABS: 10.0000 mg | ORAL_TABLET | Freq: Every day | ORAL | Status: DC

## 2012-12-31 ENCOUNTER — Encounter (HOSPITAL_COMMUNITY): Payer: Medicare Other

## 2013-01-04 ENCOUNTER — Encounter (HOSPITAL_COMMUNITY)
Admission: RE | Admit: 2013-01-04 | Discharge: 2013-01-04 | Disposition: A | Discharge status: Home / Self Care | Payer: Medicare Other | Source: Ambulatory Visit | Attending: Nephrology | Admitting: Nephrology

## 2013-01-04 DIAGNOSIS — N184 Chronic kidney disease, stage 4 (severe): Secondary | ICD-10-CM | POA: Insufficient documentation

## 2013-01-04 DIAGNOSIS — D638 Anemia in other chronic diseases classified elsewhere: Secondary | ICD-10-CM | POA: Insufficient documentation

## 2013-01-04 LAB — POCT HEMOGLOBIN-HEMACUE: Hemoglobin: 12.3 g/dL (ref 12.0–15.0)

## 2013-01-10 ENCOUNTER — Telehealth: Payer: Self-pay

## 2013-01-10 ENCOUNTER — Other Ambulatory Visit: Payer: Self-pay | Admitting: Family Medicine

## 2013-01-10 MED ORDER — FUROSEMIDE 20 MG PO TABS: ORAL_TABLET | ORAL | Status: DC

## 2013-01-10 NOTE — Telephone Encounter (Signed)
Pharmacy called and reported they have been trying to send Furosemide 20 mg QD #30 for pt for several days electronicall. We have not received this request and helped pharmacist reenter Dr Cain Saupe name w/SPI # into their system. They have resent request but it hasn't come thought yet.  Dr Milus Glazier are you willing to RF pt's Rx for Furosemide as written above? Son had stated that he was going to bring pt in for OV since you are now her PCP when we RFd her Aricept last week, but she has not been in yet.

## 2013-01-16 NOTE — Telephone Encounter (Signed)
Pt's Rx was sent in.

## 2013-01-29 ENCOUNTER — Other Ambulatory Visit: Payer: Self-pay | Admitting: Family Medicine

## 2013-01-29 ENCOUNTER — Telehealth: Payer: Self-pay | Admitting: Radiology

## 2013-01-29 NOTE — Telephone Encounter (Signed)
Namenda 10mg  bid is being requested by [facility]. Dr Milus Glazier is patients PCP now, he has her on Aricept also. He has never given her the [place], but she has been taking for long duration by Dr Jacky Kindle. Patient states she no longer is a patient of Dr Jacky Kindle, Dr Milus Glazier is her PCP. Kim at [facility] wants [place] to call her.

## 2013-01-29 NOTE — Telephone Encounter (Signed)
As we have never written these medications for this patient and her PCP is out of the office I advise her to come in and be seen.

## 2013-01-29 NOTE — Telephone Encounter (Signed)
Called pharmacy to advise patient would need to be seen. Pharmacy will advise the patients son.

## 2013-01-29 NOTE — Telephone Encounter (Signed)
Patient requesting memantine (NAMENDA) 10 MG tablet  Take 10 mg by mouth  Not written here previously.  [place] Lemaster (son) [phone] (H)

## 2013-01-31 ENCOUNTER — Encounter (HOSPITAL_COMMUNITY): Payer: Medicare Other

## 2013-02-25 ENCOUNTER — Telehealth: Payer: Self-pay | Admitting: Pulmonary Disease

## 2013-02-25 DIAGNOSIS — R911 Solitary pulmonary nodule: Secondary | ICD-10-CM

## 2013-02-25 NOTE — Telephone Encounter (Signed)
Spoke with pt's son and given appt with dr Vassie Loll on 03-04-12 at 1:30.

## 2013-02-25 NOTE — Telephone Encounter (Signed)
Spoke with pt's son .  Pt had broken hip back in 03/2012 and was unable to do f/u chest ct at that time but is now needing this done.  Order sent to [facility] to schedule Chest ct and we will schedule f/u ov with Dr Vassie Loll once ct results are back.

## 2013-03-01 ENCOUNTER — Other Ambulatory Visit: Payer: Medicare Other

## 2013-03-04 ENCOUNTER — Ambulatory Visit: Payer: Medicare Other | Admitting: Pulmonary Disease

## 2013-03-04 ENCOUNTER — Other Ambulatory Visit: Payer: Medicare Other

## 2013-03-15 ENCOUNTER — Telehealth: Payer: Self-pay | Admitting: Pulmonary Disease

## 2013-03-15 ENCOUNTER — Ambulatory Visit (INDEPENDENT_AMBULATORY_CARE_PROVIDER_SITE_OTHER)
Admission: RE | Admit: 2013-03-15 | Discharge: 2013-03-15 | Disposition: A | Discharge status: Home / Self Care | Payer: Medicare Other | Source: Ambulatory Visit | Attending: Pulmonary Disease | Admitting: Pulmonary Disease

## 2013-03-15 DIAGNOSIS — R911 Solitary pulmonary nodule: Secondary | ICD-10-CM

## 2013-03-15 NOTE — Telephone Encounter (Signed)
I was called by radiology; CT chest with enlarging LLL nodule concerning for adenocarcinoma. Radiology recommends biopsy vs resection.   The patient was seen in the past by Dr. Vassie Loll, but last note in the system from 12/12.   I am not familiar with the patient and it is unclear to me if aggressive therapy is in the patient's best interest.   Would defer further assessment and plan to treating physicians; I will route this note to pcp Dr. Milus Glazier and Dr. Jacky Kindle and Dr. Vassie Loll.

## 2013-03-16 NOTE — Telephone Encounter (Signed)
In epic Dr. Aaron Mose is listed under PCP on the main information tab (please see top of the epic chart);   Dr. Aaron Mose, in order to avoid further misunderstandings,would be helpful to  ask your office to correct the epic chart and list the correct pcp.

## 2013-03-16 NOTE — Telephone Encounter (Signed)
This is not my patient as one can see by reviewing the record in EPIC.  I am disturbed that this has come to me when I did not see the patient, did not order the CT scan, and my only connection was abstracting her paper chart into EPIC.  Please direct correspondences to the correct provider

## 2013-03-19 NOTE — Telephone Encounter (Signed)
I will take care of this OV arranged for 03/25/2013

## 2013-03-25 ENCOUNTER — Encounter: Payer: Self-pay | Admitting: Pulmonary Disease

## 2013-03-25 ENCOUNTER — Ambulatory Visit (INDEPENDENT_AMBULATORY_CARE_PROVIDER_SITE_OTHER): Payer: Medicare Other | Admitting: Pulmonary Disease

## 2013-03-25 VITALS — BP 116/66 | HR 73 | Temp 97.3°F | Ht 63.0 in | Wt 97.0 lb

## 2013-03-25 DIAGNOSIS — C3490 Malignant neoplasm of unspecified part of unspecified bronchus or lung: Secondary | ICD-10-CM

## 2013-03-25 DIAGNOSIS — R911 Solitary pulmonary nodule: Secondary | ICD-10-CM

## 2013-03-25 NOTE — Assessment & Plan Note (Addendum)
Slight increase in size of nodule concerning for lung malignancy We discussed biopsy options. The various options of biopsy including bronchoscopy, CT guided needle aspiration and surgical biopsy were discussed.The risks of each procedure including coughing, bleeding and the  chances of lung puncture requiring chest tube were discussed in great detail. The benefits & alternatives including serial follow up were also discussed.  > 30 mins of time was spent going over various options including wait & watch approach. We finally decided to opt for PET-CT in 4 mnths. Pt was a passive listener during the discussion & was not able to give a definite answer on which option she preferred. They will call back if they change their mind

## 2013-03-25 NOTE — Progress Notes (Signed)
  Subjective:    Patient ID: [name], female    DOB: [birth year], 79 y.o.   MRN: [id number]  HPI 86/F ,ex smoker with mild dementia, for evaluation of lung nodule.  She was undergoing evaluation for Anemia by Dr Rhona Leavens. Ct abdomen partially imaged a LLL nodule. This was clarified to be 1.5 x 1.6 cm x 2.4 cm spiculated nodule with a subtle air bronchogram on CT chest - no contrast. A cluster. of nodules were noted in the RML .No mediastinal or hialr Lns were noted.  Other findings included renal atrophy, extensive vascular calcification & vertebral hemangiomas.  She reports a Dry cough & 10 lb wt loss , although she has never been heavy, she weighed 10 lbs prior to her gastrectomy in '92 for gastric CA.  Son reports mild dementia - on aricept., short term memory deficits +  He reports an area of water exposure in the house & possible mold in the walls  BUn/cr 50/1.8 noted.  PET scan 06/03/11 >> The left lower lobe 2 cm mass demonstrates low level F D G uptake with maximal SUV of 1.8.    03/25/2013 Pt had broken hip back in 03/2012 and was unable to do f/u chest ct at that time LLL nodule seemed to have shrunk to 12 mm in dec' 12 is now back up to 16 mm unchanged in size on axial images compared to the original CT examination dated 05/17/2011 lesion  appears to be larger on coronal & sagittal recon with internal air bronchograms   Her dementia is unchanged, she is able to ambulate with support     Review of Systems neg for any significant sore throat, dysphagia, itching, sneezing, nasal congestion or excess/ purulent secretions, fever, chills, sweats, unintended wt loss, pleuritic or exertional cp, hempoptysis, orthopnea pnd or change in chronic leg swelling. Also denies presyncope, palpitations, heartburn, abdominal pain, nausea, vomiting, diarrhea or change in bowel or urinary habits, dysuria,hematuria, rash, arthralgias, visual complaints, headache, numbness weakness or ataxia.      Objective:   Physical Exam  Gen. Pleasant,thin woman, in no distress, normal affect ENT - no lesions, no post nasal drip Neck: No JVD, no thyromegaly, no carotid bruits Lungs: no use of accessory muscles, no dullness to percussion, clear without rales or rhonchi  Cardiovascular: Rhythm regular, heart sounds  normal, no murmurs or gallops, no peripheral edema Abdomen: soft and non-tender, no hepatosplenomegaly, BS normal. Musculoskeletal: No deformities, no cyanosis or clubbing Neuro:  alert, non focal       Assessment & Plan:

## 2013-03-25 NOTE — Patient Instructions (Signed)
Slight increase in size of nodule We discussed biopsy options PET-CT in 4 mnths

## 2013-04-22 ENCOUNTER — Other Ambulatory Visit: Payer: Self-pay | Admitting: Family Medicine

## 2013-04-22 NOTE — Telephone Encounter (Signed)
Pt due for OV.

## 2013-05-18 ENCOUNTER — Ambulatory Visit (INDEPENDENT_AMBULATORY_CARE_PROVIDER_SITE_OTHER): Payer: Medicare Other | Admitting: Emergency Medicine

## 2013-05-18 ENCOUNTER — Other Ambulatory Visit: Payer: Self-pay | Admitting: Physician Assistant

## 2013-05-18 ENCOUNTER — Encounter: Payer: Self-pay | Admitting: Family Medicine

## 2013-05-18 VITALS — BP 125/72 | HR 61 | Temp 97.4°F | Resp 16 | Ht 63.0 in | Wt 94.0 lb

## 2013-05-18 DIAGNOSIS — S81009A Unspecified open wound, unspecified knee, initial encounter: Secondary | ICD-10-CM

## 2013-05-18 DIAGNOSIS — S81809A Unspecified open wound, unspecified lower leg, initial encounter: Secondary | ICD-10-CM

## 2013-05-18 DIAGNOSIS — S91001A Unspecified open wound, right ankle, initial encounter: Secondary | ICD-10-CM

## 2013-05-18 DIAGNOSIS — M79609 Pain in unspecified limb: Secondary | ICD-10-CM

## 2013-05-18 DIAGNOSIS — Z23 Encounter for immunization: Secondary | ICD-10-CM

## 2013-05-18 MED ORDER — SILVER SULFADIAZINE 1 % EX CREA: TOPICAL_CREAM | Freq: Every day | CUTANEOUS | Status: DC

## 2013-05-18 NOTE — Patient Instructions (Addendum)

## 2013-05-18 NOTE — Progress Notes (Signed)
  Subjective:    Patient ID: [name], female    DOB: Mar 02, 1926, 79 y.o.   MRN: [id number]  HPI  79 year old female here with a sore on her right side.  Son noticed it two days ago.  It is looking worse than it was.  Patient doesn't know of any injury. Patient denies any injury to the area. She denies burning herself with coffee. She has underlying dementia.      Review of Systems     Objective:   Physical Exam there is an area of second-degree burn involving the proximal right thigh extending laterally. There is evidence of streaking that with be consistent with a liquid-type burn. There is eschar present over the burn site. There is no evidence of purulence or active infection         Assessment & Plan:  Patient treated with Silvadene tetanus updated. Recheck 1 week. They were given advice to be seen in [place] sure if she develops any fevers chills or worsening symptoms

## 2013-05-20 ENCOUNTER — Other Ambulatory Visit: Payer: Self-pay

## 2013-05-20 DIAGNOSIS — S81801A Unspecified open wound, right lower leg, initial encounter: Secondary | ICD-10-CM

## 2013-05-20 MED ORDER — SILVER SULFADIAZINE 1 % EX CREA: TOPICAL_CREAM | Freq: Every day | CUTANEOUS | Status: DC

## 2013-05-20 NOTE — Telephone Encounter (Signed)
Pharm sent req for new tub of cream bc pt had left the first one in a very hot car and needs a replacement. Sent in another Rx.

## 2013-05-21 ENCOUNTER — Other Ambulatory Visit: Payer: Self-pay | Admitting: Emergency Medicine

## 2013-05-21 ENCOUNTER — Other Ambulatory Visit: Payer: Self-pay | Admitting: Physician Assistant

## 2013-05-21 NOTE — Telephone Encounter (Signed)
Needs ov, 3rd notice

## 2013-06-04 ENCOUNTER — Ambulatory Visit (INDEPENDENT_AMBULATORY_CARE_PROVIDER_SITE_OTHER): Payer: Medicare Other | Admitting: Family Medicine

## 2013-06-04 VITALS — BP 145/69 | HR 60 | Temp 97.3°F | Resp 16 | Ht 62.0 in | Wt 97.3 lb

## 2013-06-04 DIAGNOSIS — T24201S Burn of second degree of unspecified site of right lower limb, except ankle and foot, sequela: Secondary | ICD-10-CM

## 2013-06-04 DIAGNOSIS — IMO0002 Reserved for concepts with insufficient information to code with codable children: Secondary | ICD-10-CM

## 2013-06-04 DIAGNOSIS — N390 Urinary tract infection, site not specified: Secondary | ICD-10-CM

## 2013-06-04 DIAGNOSIS — N3944 Nocturnal enuresis: Secondary | ICD-10-CM

## 2013-06-04 LAB — POCT URINALYSIS DIPSTICK
Bilirubin, UA: NEGATIVE
Glucose, UA: NEGATIVE
Spec Grav, UA: 1.02
Urobilinogen, UA: 0.2

## 2013-06-04 LAB — POCT UA - MICROSCOPIC ONLY: Casts, Ur, LPF, POC: NEGATIVE

## 2013-06-04 MED ORDER — CEPHALEXIN 500 MG PO CAPS: 500.0000 mg | ORAL_CAPSULE | Freq: Three times a day (TID) | ORAL | Status: DC

## 2013-06-04 NOTE — Progress Notes (Signed)
Patient ID: [name] MRN: [id number], DOB: Jul 20, 1926, 79 y.o. Date of Encounter: 06/04/2013, 8:42 PM  Primary Physician: Elvina Sidle, MD  Chief Complaint: Follow up burn  HPI: 79 y.o. female with history below presents for follow up of burn to the right proximal thigh. She was initially evaluated on 05/18/13 by Dr. Cleta Alberts. Unfortunately the family suffered a few deaths and they were unable to follow up until now due to travel. They have been performing daily dressing changes with Silvadene and Neosporin. No concerns voices about the wound.    Her son also also notes her recently having had a UTI that she received treatment for. Since finishing treatment for this she still wets the bed in the morning. He requests that we check for a residual or second UTI. He is worried about urine getting into her wound.    Past Medical History  Diagnosis Date  . Dementia   . Hypertension   . Ulcer     Hx of  . Stomach cancer   . Snoring   . Fracture, intertrochanteric, left femur 03/25/2012  . Fracture, intertrochanteric, right femur 06/2008    s/p closed reduction and internal fixation  . Nodule of left lung     followed by Dr. Vassie Loll // LLL spiculated nodule noted per CT in 2012 1.5x1.6x2.4, PET in 06/03/2011 was low uptake.     Home Meds: Prior to Admission medications   Medication Sig Start Date End Date Taking? Authorizing Provider  aspirin 81 MG tablet Take 81 mg by mouth daily.      Historical Provider, MD  calcitRIOL (ROCALTROL) 1 MCG/ML solution Take 0.25 mcg by mouth daily.    Historical Provider, MD  donepezil (ARICEPT) 10 MG tablet Take 1 tablet (10 mg total) by mouth daily. NEED VISIT - 3rd NOTICE! 05/21/13   Godfrey Pick, PA-C  furosemide (LASIX) 20 MG tablet One tablet qam 01/10/13   Elvina Sidle, MD  memantine (NAMENDA) 10 MG tablet Take 10 mg by mouth 2 (two) times daily.     Historical Provider, MD  Multiple Vitamins-Minerals (EQL GUMMY ADULT PO) Take 1 each by mouth  daily.      Historical Provider, MD  silver sulfADIAZINE (SILVADENE) 1 % cream Apply topically daily. 05/20/13   Collene Gobble, MD  sodium bicarbonate 650 MG tablet Take 650 mg by mouth 4 (four) times daily.    Historical Provider, MD  vitamin B-12 (CYANOCOBALAMIN) 1000 MCG tablet Take 1,000 mcg by mouth every Monday, Wednesday, and Friday. Three times a week    Historical Provider, MD    Allergies: No Known Allergies  History   Social History  . Marital Status: Married    Spouse Name: N/A    Number of Children: N/A  . Years of Education: N/A   Occupational History  . Not on file.   Social History Main Topics  . Smoking status: Former Smoker -- 0.30 packs/day for 2 years    Types: Cigarettes    Quit date: 12/12/1973  . Smokeless tobacco: Never Used  . Alcohol Use: Yes     Comment: rare  . Drug Use: Not on file  . Sexually Active: Not on file   Other Topics Concern  . Not on file   Social History Narrative  . No narrative on file     Review of Systems: Constitutional: negative for chills or fever  Dermatological: positive for wound   Physical Exam: Blood pressure 145/69, pulse 60, temperature 97.3 F (36.3  C), temperature source Oral, resp. rate 16, height 5\' 2"  (1.575 m), weight 97 lb 4.8 oz (44.135 kg), SpO2 98.00%., Body mass index is 17.79 kg/(m^2). General: Well developed, well nourished, in no acute distress. Head: Normocephalic, atraumatic, eyes without discharge, sclera non-icteric, nares are without discharge.   Neck: Supple. Full ROM. Lungs: Breathing is unlabored. Heart: Regular rate. Msk:  Strength and tone normal for age. Abdomen: Soft, non-tender, non-distended. No rebound/guarding. No obvious abdominal masses. No CVA TTP. Extremities/Skin: Warm and dry. No clubbing or cyanosis. No edema. Well healing second degree type burn to the right proximal thigh that extends laterally. Granulation tissue present along the central aspect of the main wound. 2 scabs  present inferiorly. No erythema or signs of infection. No purulence. Sensation intact.   Neuro: Alert and oriented X 3. Moves all extremities spontaneously. Gait is normal. CNII-XII grossly in tact. Psych:  Responds to questions appropriately with a normal affect.  Debbie in room for entire exam.   Labs: Results for orders placed in visit on 06/04/13  POCT UA - MICROSCOPIC ONLY      Result Value Range   WBC, Ur, HPF, POC TNTC     RBC, urine, microscopic 15-20     Bacteria, U Microscopic 3+     Mucus, UA trace     Epithelial cells, urine per micros 5-8     Crystals, Ur, HPF, POC neg     Casts, Ur, LPF, POC neg     Yeast, UA neg    POCT URINALYSIS DIPSTICK      Result Value Range   Color, UA yellow     Clarity, UA cloudy     Glucose, UA neg     Bilirubin, UA neg     Ketones, UA trace     Spec Grav, UA 1.020     Blood, UA large     pH, UA 7.5     Protein, UA 3+     Urobilinogen, UA 0.2     Nitrite, UA neg     Leukocytes, UA large (3+)       ASSESSMENT AND PLAN:  79 y.o. female with burn to proximal right thigh and UTI  1) Burn -Silvadene dressing in room -Wash wound daily -Continue daily silvadene dressings -Recheck 1 week, sooner if worse  2) UTI -KEflex 500 mg 1 po tid #30 no RF -Patient discussed with and seen by Dr. Neva Seat   Signed, Eula Listen, PA-C 06/04/2013 8:42 PM

## 2013-06-04 NOTE — Progress Notes (Signed)
  Subjective:    Patient ID: [name], female    DOB: 03-11-1926, 79 y.o.   MRN: [id number]  HPI See notes from Eula Listen PA-C.  I personally obtained history and exam as below in regards to her urinary symptoms and agree with his findings an plan.   Hx of UTI few months ago - recently caregiver noticed more incontinence. No fever/abd pain/back pain.   Review of Systems As above and per [employer] note.     Objective:   Physical Exam  Constitutional: She is oriented to person, place, and time. She appears well-developed and well-nourished.  HENT:  Head: Normocephalic and atraumatic.  Pulmonary/Chest: Effort normal.  Abdominal: Soft. Normal appearance. She exhibits no distension. There is no tenderness. There is no rebound, no guarding and no CVA tenderness.  Neurological: She is alert and oriented to person, place, and time.  Psychiatric: She has a normal mood and affect. Her behavior is normal.   Results for orders placed in visit on 06/04/13  POCT UA - MICROSCOPIC ONLY      Result Value Range   WBC, Ur, HPF, POC TNTC     RBC, urine, microscopic 15-20     Bacteria, U Microscopic 3+     Mucus, UA trace     Epithelial cells, urine per micros 5-8     Crystals, Ur, HPF, POC neg     Casts, Ur, LPF, POC neg     Yeast, UA neg    POCT URINALYSIS DIPSTICK      Result Value Range   Color, UA yellow     Clarity, UA cloudy     Glucose, UA neg     Bilirubin, UA neg     Ketones, UA trace     Spec Grav, UA 1.020     Blood, UA large     pH, UA 7.5     Protein, UA 3+     Urobilinogen, UA 0.2     Nitrite, UA neg     Leukocytes, UA large (3+)         Assessment & Plan:  UTi likely in setting of incontinence. Doubt asx. Bacteruria. Checking urine cx and agree with keflex treatment initially.   reviewed plan for leg burn, agree with plan.

## 2013-06-07 LAB — URINE CULTURE: Colony Count: >=100000

## 2013-06-08 ENCOUNTER — Other Ambulatory Visit: Payer: Self-pay | Admitting: Family Medicine

## 2013-06-27 ENCOUNTER — Ambulatory Visit (INDEPENDENT_AMBULATORY_CARE_PROVIDER_SITE_OTHER): Payer: Medicare Other | Admitting: Family Medicine

## 2013-06-27 VITALS — BP 98/50 | HR 86 | Temp 98.0°F | Resp 16 | Ht 62.0 in | Wt 97.0 lb

## 2013-06-27 DIAGNOSIS — T300 Burn of unspecified body region, unspecified degree: Secondary | ICD-10-CM

## 2013-06-27 DIAGNOSIS — N390 Urinary tract infection, site not specified: Secondary | ICD-10-CM

## 2013-06-27 NOTE — Progress Notes (Signed)
[facility]:  Office Visit  Chief Complaint:  Chief Complaint  Patient presents with  . Follow-up    burn right leg    HPI: [name] is a 79 y.o. female who complains of :  Here for UTI recheck, has been bedwetting and was found to have a UTI. E coli was given Keflex. Finished it. Here for recheck. Unable to give urine sample,  urinated everywehre but into urinal hat. Son will bring urine in tomorrow. She has had less bedwetting since treatment. She did not wet her bed x 2 nights prior to yesterday but last night she did  Here for burn/sore recheck, doing much better. Using silvidene daily with dry dressing and wound care daily as well.   She is here with her son, she has dementia, he is taking care of both his parents at their house.   Past Medical History  Diagnosis Date  . Dementia   . Hypertension   . Ulcer     Hx of  . Stomach cancer   . Snoring   . Fracture, intertrochanteric, left femur 03/25/2012  . Fracture, intertrochanteric, right femur 06/2008    s/p closed reduction and internal fixation  . Nodule of left lung     followed by Dr. Vassie Loll // LLL spiculated nodule noted per CT in 2012 1.5x1.6x2.4, PET in 06/03/2011 was low uptake.   Past Surgical History  Procedure Laterality Date  . Stomach surgery      3/4 of stomach removed due to stomach cancer  . Hip surgery  2009    right  . Total hip arthroplasty  03/29/2012    Procedure: TOTAL HIP ARTHROPLASTY;  Surgeon: Eulas Post, MD;  Location: [facility] OR;  Service: Orthopedics;  Laterality: Left;  Left Hip Hemiarthroplasty   History   Social History  . Marital Status: Married    Spouse Name: N/A    Number of Children: N/A  . Years of Education: N/A   Social History Main Topics  . Smoking status: Former Smoker -- 0.30 packs/day for 2 years    Types: Cigarettes    Quit date: 12/12/1973  . Smokeless tobacco: Never Used  . Alcohol Use: Yes     Comment: rare  . Drug Use: None  . Sexually  Active: None   Other Topics Concern  . None   Social History Narrative  . None   Family History  Problem Relation Age of Onset  . Cancer Father   . Liver cancer Son   . Allergies     No Known Allergies Prior to Admission medications   Medication Sig Start Date End Date Taking? Authorizing Provider  aspirin 81 MG tablet Take 81 mg by mouth daily.     Yes Historical Provider, MD  calcitRIOL (ROCALTROL) 1 MCG/ML solution Take 0.25 mcg by mouth daily.   Yes Historical Provider, MD  donepezil (ARICEPT) 10 MG tablet Take 1 tablet (10 mg total) by mouth daily. NEED VISIT - 3rd NOTICE! 05/21/13  Yes Eleanore Delia Chimes, PA-C  memantine (NAMENDA) 10 MG tablet Take 10 mg by mouth 2 (two) times daily.    Yes Historical Provider, MD  Multiple Vitamins-Minerals (EQL GUMMY ADULT PO) Take 1 each by mouth daily.     Yes Historical Provider, MD  silver sulfADIAZINE (SILVADENE) 1 % cream Apply topically daily. 05/20/13  Yes Collene Gobble, MD  sodium bicarbonate 650 MG tablet Take 650 mg by mouth 4 (four) times daily.  Yes Historical Provider, MD  vitamin B-12 (CYANOCOBALAMIN) 1000 MCG tablet Take 1,000 mcg by mouth every Monday, Wednesday, and Friday. Three times a week   Yes Historical Provider, MD  cephALEXin (KEFLEX) 500 MG capsule Take 1 capsule (500 mg total) by mouth 3 (three) times daily. 06/04/13   Sondra Barges, PA-C  furosemide (LASIX) 20 MG tablet TAKE 1 TABLET IN THE MORNING. 06/08/13   Nelva Nay, PA-C     ROS: Unable to do full ROS due to dementia   PHYSICAL EXAM: Filed Vitals:   06/27/13 2038  BP: 98/50  Pulse: 86  Temp: 98 F (36.7 C)  Resp: 16   Filed Vitals:   06/27/13 2038  Height: 5\' 2"  (1.575 m)  Weight: 97 lb (43.999 kg)   Body mass index is 17.74 kg/(m^2).  General: Alert, no acute distress, elderly pleasantly demented female HEENT:  Normocephalic, atraumatic, oropharynx patent.  Cardiovascular:  Regular rate and rhythm, no rubs murmurs or gallops.   No pedal edema.   Respiratory: Clear to auscultation bilaterally.  No wheezes, rales, or rhonchi.  No cyanosis, no use of accessory musculature GI: No organomegaly, abdomen is soft and non-tender, positive bowel sounds.  No masses. Skin: + Right proximal thigh wound pink  skin present at site of wounds, no open areas that are still granulating , no drainage, no warmth, no sign o infection  Neurologic: Facial musculature symmetric. Psychiatric: Patient is appropriate throughout our interaction. Lymphatic: No cervical lymphadenopathy Musculoskeletal: Gait  Slouched, needs assistance   LABS: Results for orders placed in visit on 06/04/13  URINE CULTURE      Result Value Range   Culture ESCHERICHIA COLI     Colony Count >=100,000 COLONIES/ML     Organism ID, Bacteria ESCHERICHIA COLI    POCT UA - MICROSCOPIC ONLY      Result Value Range   WBC, Ur, HPF, POC TNTC     RBC, urine, microscopic 15-20     Bacteria, U Microscopic 3+     Mucus, UA trace     Epithelial cells, urine per micros 5-8     Crystals, Ur, HPF, POC neg     Casts, Ur, LPF, POC neg     Yeast, UA neg    POCT URINALYSIS DIPSTICK      Result Value Range   Color, UA yellow     Clarity, UA cloudy     Glucose, UA neg     Bilirubin, UA neg     Ketones, UA trace     Spec Grav, UA 1.020     Blood, UA large     pH, UA 7.5     Protein, UA 3+     Urobilinogen, UA 0.2     Nitrite, UA neg     Leukocytes, UA large (3+)       EKG/XRAY:   Primary read interpreted by Dr. Conley Rolls at [facility].   ASSESSMENT/PLAN: Encounter Diagnoses  Name Primary?  . UTI (urinary tract infection) Yes  . Burn    Will f/u with urine specimen in the AM to recheck urine for UTI, was recently dx with E coli and was on Kelfex. Burn/Wound  is better 99.9 % completely healed, there is only one small pinpoint area that is slightly friable at the burn site, continue with silvidene and wound care for 1 more week then dc.  We do not know if it is a burn but the area of  interest is healing, patient has  dementia and does not know how this occurred. Son noticed it on 05/16/2013 and brought her in.  Monitor for s/sx of infection F/u prn     Ryelan Kazee PHUONG, DO 06/27/2013 9:36 PM

## 2013-07-04 ENCOUNTER — Other Ambulatory Visit: Payer: Self-pay | Admitting: Physician Assistant

## 2013-07-04 ENCOUNTER — Telehealth: Payer: Self-pay

## 2013-07-04 ENCOUNTER — Encounter: Payer: Self-pay | Admitting: Family Medicine

## 2013-07-04 LAB — POCT URINALYSIS DIPSTICK
Bilirubin, UA: NEGATIVE
Glucose, UA: NEGATIVE
Nitrite, UA: NEGATIVE
Protein, UA: 100
Spec Grav, UA: 1.02
Urobilinogen, UA: 0.2
pH, UA: 7

## 2013-07-04 LAB — POCT UA - MICROSCOPIC ONLY
Casts, Ur, LPF, POC: NEGATIVE
Crystals, Ur, HPF, POC: NEGATIVE
Mucus, UA: POSITIVE
Yeast, UA: NEGATIVE

## 2013-07-04 NOTE — Addendum Note (Signed)
Addended by: Gerrianne Scale on: 07/04/2013 09:10 AM   Modules accepted: Orders

## 2013-07-05 MED ORDER — DONEPEZIL HCL 10 MG PO TABS: 10.0000 mg | ORAL_TABLET | Freq: Every day | ORAL | Status: DC

## 2013-07-05 NOTE — Telephone Encounter (Signed)
Pt's son called concerning our denial of Aricept RF d/t pt needing OV. Reviewed chart and pt has been recently for UTI but did speak with Dr Conley Rolls about pt's dementia at OV. D/W son that pt's neurologist is the best provider to be managing this medication and son will ask him about it for next RF. Discussed w/Heather and will RF x 1mos. Notified son Rx sent .

## 2013-07-06 ENCOUNTER — Telehealth: Payer: Self-pay

## 2013-07-06 DIAGNOSIS — N390 Urinary tract infection, site not specified: Secondary | ICD-10-CM

## 2013-07-06 LAB — URINE CULTURE: Colony Count: >=100000

## 2013-07-06 NOTE — Telephone Encounter (Signed)
Please review

## 2013-07-06 NOTE — Telephone Encounter (Signed)
pts son, [name], called to get lab results  [name] can be reached at [phone]

## 2013-07-10 ENCOUNTER — Telehealth: Payer: Self-pay | Admitting: Pulmonary Disease

## 2013-07-10 ENCOUNTER — Telehealth: Payer: Self-pay | Admitting: Family Medicine

## 2013-07-10 NOTE — Telephone Encounter (Signed)
Son CB and I gave him info from Dr Conley Rolls. He wanted to ask if there would be any increased risk of tendon rupture w/cipro if pt is on a trip to [place] where she will be more active than normal. They will be driving up for 7-8 days.

## 2013-07-10 NOTE — Telephone Encounter (Signed)
Ok to drive up to [employer]. Address concerns of tendon rupture.

## 2013-07-10 NOTE — Telephone Encounter (Signed)
Called patient x3 and lm for return appointment. No return call back. Sent letter 07/10/13. °

## 2013-07-10 NOTE — Telephone Encounter (Signed)
LMOM for Brad to CB. Per Dr Conley Rolls: new Rx for Cipro sent to pharm BID for 7 days - tell son there is a slight risk of tendon rupture in the elderly, but this is Abx w/strongest sensitivity to pt's bacteria. Could put pt on another Abx, but lower sensitivity than the Keflex that has not been effective. Son needs to p/up urine cup and bring in urine sample in 8 - 10 days for repeat tests. Future orders have been put in for UA.

## 2013-07-11 MED ORDER — CIPROFLOXACIN HCL 500 MG PO TABS: 500.0000 mg | ORAL_TABLET | Freq: Two times a day (BID) | ORAL | Status: DC

## 2013-07-11 NOTE — Telephone Encounter (Signed)
Son CB and [facility] had not received Rx. I will resend it. Also son stated that if they are OOT on the 8-10 days, they will try to find an urgent care up there to have UA/culture done. Otherwise will RTC to have it done.

## 2013-07-25 ENCOUNTER — Encounter (HOSPITAL_COMMUNITY): Payer: Medicare Other

## 2013-07-26 ENCOUNTER — Telehealth: Payer: Self-pay

## 2013-07-29 ENCOUNTER — Other Ambulatory Visit (INDEPENDENT_AMBULATORY_CARE_PROVIDER_SITE_OTHER): Payer: Medicare Other | Admitting: Family Medicine

## 2013-07-29 DIAGNOSIS — N390 Urinary tract infection, site not specified: Secondary | ICD-10-CM

## 2013-07-29 LAB — POCT UA - MICROSCOPIC ONLY
Casts, Ur, LPF, POC: NEGATIVE
Crystals, Ur, HPF, POC: NEGATIVE
Mucus, UA: NEGATIVE
Yeast, UA: NEGATIVE

## 2013-07-29 LAB — POCT URINALYSIS DIPSTICK
Bilirubin, UA: NEGATIVE
Glucose, UA: NEGATIVE
Nitrite, UA: NEGATIVE
Protein, UA: 100
Spec Grav, UA: 1.02
Urobilinogen, UA: 0.2
pH, UA: 6.5

## 2013-07-30 ENCOUNTER — Telehealth: Payer: Self-pay | Admitting: Neurology

## 2013-07-30 NOTE — Telephone Encounter (Signed)
I tried to call back on the number given, unable to reach the patient. The patient has not been seen through this office since 2012, and we have not written a prescription for Aricept since 2010. We are not writing this prescription at this point.

## 2013-07-31 ENCOUNTER — Telehealth: Payer: Self-pay | Admitting: Family Medicine

## 2013-07-31 NOTE — Telephone Encounter (Signed)
Spoke with son. She is aysmptomatic. I have already treated her with cipro for 10 days and am hesistant to treat her with mor abx if this is a contaminant. We have ordered urine cx and will await for those results.

## 2013-08-01 LAB — URINE CULTURE: Colony Count: 30000

## 2013-08-07 ENCOUNTER — Other Ambulatory Visit: Payer: Self-pay | Admitting: Physician Assistant

## 2013-08-07 ENCOUNTER — Encounter (HOSPITAL_COMMUNITY): Payer: Medicare Other

## 2013-08-07 ENCOUNTER — Telehealth: Payer: Self-pay

## 2013-08-07 NOTE — Telephone Encounter (Signed)
Patients son called to speak with Britta Mccreedy about his mother.  He sounded anxious and needs to speak with someone as soon as possible. Call at [phone].

## 2013-08-07 NOTE — Telephone Encounter (Signed)
Molly Maduro called and left a message saying the patients old MD and PCP were prescribing Aricept, (at first he said Namenda, because she is on that too, but then stated that was an error, and it was Aricept they needed) and the other doctors will no longer fill meds and he now wants Dr Marylou Flesher to refill it.  He was very rude in his message, yelling.  Said we need to get our facts straight about when she was seen and they are going on vacation, so he wants meds filled. Says if we do not call him back, he will come here in person tomorrow to get this straightened out.  I called the pharmacy and spoke with the pharmacist regarding Rx.  She said Molly Maduro has called them several times and has been rude to them.  Says patient was getting meds from other MD, but they will no longer fill it because the son refuses to take her back for an appt.  Thanked her for the info.  I called Robert back.  From the beginning of the phone call, he was yelling at me.  Says no one ever calls him back for several days and we better fill her meds because she cannot go without them.  (I called him back less than 20 minutes after the message was left).  He said the patient was diagnosed by [place] in 2008 and we need to fill her meds!!!  I advised him she would need to schedule an appt.  He said it's not right for [place] to say she needs to come in when she already came in before.  I explained she needed to be seen at least once per year.  Last OV was 05/23/2011, noting patient needs to RV yearly.  He kept yelling at me and saying they are going on vacation and they want meds NOW!!!  Each time I tried to speak, he interrupted me.  I asked him to stop yelling at me, as I was trying to help him.  He said no one here helps them.  Again I told him I was trying to help them, and asked that he stop yelling at me.  I told him they would need to schedule and KEEP appt or we could not refill meds.  He continued to yell at me.  I asked him to calm down, and told him I  would be happy to connect him with the administrator if he wished.  At first he said yes, then he told me to stop talking to him in that manner (asking him to calm down and stop yelling).  In turn, I again asked him to stop yelling.  I told him I could get him with a scheduler and he said "they don't know how to do anything".  I asked him to hold while I get him with a scheduler, placed him on hold, and when I went to pick up again, he was no longer on the line.   I went to speak to Vernona Rieger about this patient.  Lupita Leash came in the office and said he called back and cursed at [place].  She also said it looks like the patient was dismissed previously.  I looked back in [facility] on the chart, and we did send a dismissal letter on 03/02/2011, however, the patient was seen after this letter was sent.  I printed a copy of this letter and gave it to Addison.  We will likely send another dismissal letter to the patient  if okayed by Dr Vickey Huger.

## 2013-08-07 NOTE — Telephone Encounter (Signed)
To you 

## 2013-08-08 NOTE — Telephone Encounter (Signed)
Called son back and LMOM (OK per HIPPA) that 2 RFs were sent for mother's Aricept (see refill note and also note from neurologist) but that pt will need to be seen to discuss and do labs before 2nd RF runs out.

## 2013-08-21 ENCOUNTER — Other Ambulatory Visit: Payer: Self-pay | Admitting: Physician Assistant

## 2013-08-21 ENCOUNTER — Encounter (HOSPITAL_COMMUNITY): Payer: Medicare Other

## 2013-08-23 ENCOUNTER — Telehealth: Payer: Self-pay

## 2013-08-23 MED ORDER — FUROSEMIDE 20 MG PO TABS: ORAL_TABLET | ORAL | Status: DC

## 2013-08-23 NOTE — Telephone Encounter (Signed)
Sent in

## 2013-08-23 NOTE — Telephone Encounter (Signed)
Meriam Sprague from [facility] is calling in reference to a refill request for lasix that they have been sending since 08-13-13, pt is due for he delivery of this medication at 11 and would like to know if Rhoderick Moody could authorize this refill asap. Best# [phone]

## 2013-08-29 ENCOUNTER — Encounter (HOSPITAL_COMMUNITY): Payer: Self-pay | Admitting: Emergency Medicine

## 2013-08-29 ENCOUNTER — Inpatient Hospital Stay (HOSPITAL_COMMUNITY): Payer: Medicare Other

## 2013-08-29 ENCOUNTER — Emergency Department (HOSPITAL_COMMUNITY): Payer: Medicare Other

## 2013-08-29 ENCOUNTER — Inpatient Hospital Stay (HOSPITAL_COMMUNITY)
Admission: EM | Admit: 2013-08-29 | Discharge: 2013-09-05 | DRG: 065 | Disposition: A | Discharge status: Rehabilitation Facility | Payer: Medicare Other | Attending: Family Medicine | Admitting: Family Medicine

## 2013-08-29 DIAGNOSIS — Z8711 Personal history of peptic ulcer disease: Secondary | ICD-10-CM

## 2013-08-29 DIAGNOSIS — I69920 Aphasia following unspecified cerebrovascular disease: Secondary | ICD-10-CM

## 2013-08-29 DIAGNOSIS — R32 Unspecified urinary incontinence: Secondary | ICD-10-CM | POA: Diagnosis present

## 2013-08-29 DIAGNOSIS — R531 Weakness: Secondary | ICD-10-CM | POA: Diagnosis present

## 2013-08-29 DIAGNOSIS — E785 Hyperlipidemia, unspecified: Secondary | ICD-10-CM | POA: Diagnosis present

## 2013-08-29 DIAGNOSIS — I129 Hypertensive chronic kidney disease with stage 1 through stage 4 chronic kidney disease, or unspecified chronic kidney disease: Secondary | ICD-10-CM | POA: Diagnosis present

## 2013-08-29 DIAGNOSIS — N390 Urinary tract infection, site not specified: Secondary | ICD-10-CM | POA: Diagnosis present

## 2013-08-29 DIAGNOSIS — Z681 Body mass index (BMI) 19 or less, adult: Secondary | ICD-10-CM

## 2013-08-29 DIAGNOSIS — Z515 Encounter for palliative care: Secondary | ICD-10-CM

## 2013-08-29 DIAGNOSIS — I4891 Unspecified atrial fibrillation: Secondary | ICD-10-CM | POA: Diagnosis not present

## 2013-08-29 DIAGNOSIS — I10 Essential (primary) hypertension: Secondary | ICD-10-CM | POA: Diagnosis present

## 2013-08-29 DIAGNOSIS — R1310 Dysphagia, unspecified: Secondary | ICD-10-CM | POA: Diagnosis present

## 2013-08-29 DIAGNOSIS — K279 Peptic ulcer, site unspecified, unspecified as acute or chronic, without hemorrhage or perforation: Secondary | ICD-10-CM | POA: Diagnosis present

## 2013-08-29 DIAGNOSIS — L8990 Pressure ulcer of unspecified site, unspecified stage: Secondary | ICD-10-CM | POA: Diagnosis present

## 2013-08-29 DIAGNOSIS — F0390 Unspecified dementia without behavioral disturbance: Secondary | ICD-10-CM | POA: Diagnosis present

## 2013-08-29 DIAGNOSIS — E872 Acidosis, unspecified: Secondary | ICD-10-CM | POA: Diagnosis not present

## 2013-08-29 DIAGNOSIS — R159 Full incontinence of feces: Secondary | ICD-10-CM | POA: Diagnosis present

## 2013-08-29 DIAGNOSIS — I6340 Cerebral infarction due to embolism of unspecified cerebral artery: Principal | ICD-10-CM | POA: Diagnosis present

## 2013-08-29 DIAGNOSIS — R1313 Dysphagia, pharyngeal phase: Secondary | ICD-10-CM | POA: Diagnosis present

## 2013-08-29 DIAGNOSIS — R636 Underweight: Secondary | ICD-10-CM | POA: Diagnosis present

## 2013-08-29 DIAGNOSIS — R5381 Other malaise: Secondary | ICD-10-CM | POA: Diagnosis present

## 2013-08-29 DIAGNOSIS — N183 Chronic kidney disease, stage 3 unspecified: Secondary | ICD-10-CM | POA: Diagnosis present

## 2013-08-29 DIAGNOSIS — Z85028 Personal history of other malignant neoplasm of stomach: Secondary | ICD-10-CM

## 2013-08-29 DIAGNOSIS — L89109 Pressure ulcer of unspecified part of back, unspecified stage: Secondary | ICD-10-CM | POA: Diagnosis present

## 2013-08-29 DIAGNOSIS — Z96649 Presence of unspecified artificial hip joint: Secondary | ICD-10-CM

## 2013-08-29 DIAGNOSIS — R1311 Dysphagia, oral phase: Secondary | ICD-10-CM | POA: Diagnosis present

## 2013-08-29 DIAGNOSIS — R471 Dysarthria and anarthria: Secondary | ICD-10-CM | POA: Diagnosis present

## 2013-08-29 DIAGNOSIS — D649 Anemia, unspecified: Secondary | ICD-10-CM | POA: Diagnosis present

## 2013-08-29 DIAGNOSIS — G459 Transient cerebral ischemic attack, unspecified: Secondary | ICD-10-CM

## 2013-08-29 DIAGNOSIS — Z87891 Personal history of nicotine dependence: Secondary | ICD-10-CM

## 2013-08-29 DIAGNOSIS — R911 Solitary pulmonary nodule: Secondary | ICD-10-CM

## 2013-08-29 DIAGNOSIS — I639 Cerebral infarction, unspecified: Secondary | ICD-10-CM

## 2013-08-29 LAB — CBC
HCT: 32.6 % — ABNORMAL LOW (ref 36.0–46.0)
MCH: 31.4 pg (ref 26.0–34.0)
MCHC: 33.1 g/dL (ref 30.0–36.0)
MCV: 96 fL (ref 78.0–100.0)
MCV: 94.8 fL (ref 78.0–100.0)
Platelets: 243 10*3/uL (ref 150–400)
Platelets: 202 10*3/uL (ref 150–400)
RBC: 3.78 MIL/uL — ABNORMAL LOW (ref 3.87–5.11)
RDW: 13.3 % (ref 11.5–15.5)
RDW: 13.1 % (ref 11.5–15.5)
WBC: 6.7 10*3/uL (ref 4.0–10.5)

## 2013-08-29 LAB — PROTIME-INR: Prothrombin Time: 12 seconds (ref 11.6–15.2)

## 2013-08-29 LAB — POCT I-STAT, CHEM 8
BUN: 40 mg/dL — ABNORMAL HIGH (ref 6–23)
Creatinine, Ser: 1.8 mg/dL — ABNORMAL HIGH (ref 0.50–1.10)
Glucose, Bld: 95 mg/dL (ref 70–99)
Potassium: 3.9 mEq/L (ref 3.5–5.1)
Sodium: 141 mEq/L (ref 135–145)
TCO2: 27 mmol/L (ref 0–100)

## 2013-08-29 LAB — RAPID URINE DRUG SCREEN, HOSP PERFORMED
Barbiturates: NOT DETECTED
Cocaine: NOT DETECTED
Tetrahydrocannabinol: NOT DETECTED

## 2013-08-29 LAB — URINALYSIS, ROUTINE W REFLEX MICROSCOPIC
Bilirubin Urine: NEGATIVE
Nitrite: NEGATIVE
Specific Gravity, Urine: 1.01 (ref 1.005–1.030)
Urobilinogen, UA: 0.2 mg/dL (ref 0.0–1.0)
pH: 8 (ref 5.0–8.0)

## 2013-08-29 LAB — COMPREHENSIVE METABOLIC PANEL
ALT: 12 U/L (ref 0–35)
AST: 22 U/L (ref 0–37)
Alkaline Phosphatase: 115 U/L (ref 39–117)
CO2: 24 mEq/L (ref 19–32)
Calcium: 9.1 mg/dL (ref 8.4–10.5)
Chloride: 100 mEq/L (ref 96–112)
GFR calc Af Amer: 30 mL/min — ABNORMAL LOW (ref >90)
GFR calc non Af Amer: 26 mL/min — ABNORMAL LOW (ref >90)
Glucose, Bld: 109 mg/dL — ABNORMAL HIGH (ref 70–99)
Potassium: 3.8 mEq/L (ref 3.5–5.1)
Sodium: 137 mEq/L (ref 135–145)
Total Bilirubin: 0.3 mg/dL (ref 0.3–1.2)

## 2013-08-29 LAB — DIFFERENTIAL
Basophils Absolute: 0.1 10*3/uL (ref 0.0–0.1)
Eosinophils Relative: 4 % (ref 0–5)
Lymphocytes Relative: 20 % (ref 12–46)
Lymphs Abs: 1.3 10*3/uL (ref 0.7–4.0)
Neutro Abs: 4.4 10*3/uL (ref 1.7–7.7)

## 2013-08-29 LAB — CREATININE, SERUM: GFR calc Af Amer: 33 mL/min — ABNORMAL LOW (ref >90)

## 2013-08-29 LAB — TROPONIN I: Troponin I: <0.3 ng/mL (ref <0.30)

## 2013-08-29 LAB — GLUCOSE, CAPILLARY

## 2013-08-29 LAB — URINE MICROSCOPIC-ADD ON

## 2013-08-29 LAB — APTT: aPTT: 27 seconds (ref 24–37)

## 2013-08-29 MED ORDER — SENNOSIDES-DOCUSATE SODIUM 8.6-50 MG PO TABS
1.0000 | ORAL_TABLET | Freq: Every evening | ORAL | Status: DC | PRN
  Filled 2013-08-29: qty 1

## 2013-08-29 MED ORDER — HYDRALAZINE HCL 20 MG/ML IJ SOLN
2.0000 mg | INTRAMUSCULAR | Status: DC | PRN
  Administered 2013-08-29 – 2013-08-30 (×2): 2 mg via INTRAVENOUS
  Filled 2013-08-29 (×2): qty 1

## 2013-08-29 MED ORDER — ASPIRIN 81 MG PO CHEW
81.0000 mg | CHEWABLE_TABLET | Freq: Every day | ORAL | Status: DC
  Filled 2013-08-29: qty 1

## 2013-08-29 MED ORDER — DEXTROSE 5 % IV SOLN
1.0000 g | Freq: Once | INTRAVENOUS | Status: AC
  Administered 2013-08-29: 1 g via INTRAVENOUS
  Filled 2013-08-29: qty 10

## 2013-08-29 MED ORDER — ASPIRIN 300 MG RE SUPP
300.0000 mg | Freq: Once | RECTAL | Status: AC
  Administered 2013-08-29: 300 mg via RECTAL
  Filled 2013-08-29: qty 1

## 2013-08-29 MED ORDER — ATORVASTATIN CALCIUM 40 MG PO TABS
40.0000 mg | ORAL_TABLET | Freq: Every day | ORAL | Status: DC
  Administered 2013-09-03 – 2013-09-04 (×2): 40 mg via ORAL
  Filled 2013-08-29 (×7): qty 1

## 2013-08-29 MED ORDER — HEPARIN SODIUM (PORCINE) 5000 UNIT/ML IJ SOLN
5000.0000 [IU] | Freq: Three times a day (TID) | INTRAMUSCULAR | Status: DC
  Administered 2013-08-29 – 2013-09-05 (×19): 5000 [IU] via SUBCUTANEOUS
  Filled 2013-08-29 (×24): qty 1

## 2013-08-29 MED ORDER — DEXTROSE 5 % IV SOLN
1.0000 g | INTRAVENOUS | Status: AC
  Administered 2013-08-30 – 2013-08-31 (×2): 1 g via INTRAVENOUS
  Filled 2013-08-29 (×2): qty 10

## 2013-08-29 MED ORDER — ACETAMINOPHEN 325 MG PO TABS: 650.0000 mg | ORAL_TABLET | ORAL | Status: DC | PRN

## 2013-08-29 MED ORDER — SODIUM CHLORIDE 0.9 % IV SOLN
INTRAVENOUS | Status: DC
  Administered 2013-08-29: 1000 mL via INTRAVENOUS
  Administered 2013-08-30: 14:00:00 via INTRAVENOUS

## 2013-08-29 MED ORDER — ACETAMINOPHEN 650 MG RE SUPP
650.0000 mg | RECTAL | Status: DC | PRN
  Filled 2013-08-29: qty 1

## 2013-08-29 MED ORDER — SODIUM CHLORIDE 0.9 % IV BOLUS (SEPSIS)
1000.0000 mL | Freq: Once | INTRAVENOUS | Status: AC
  Administered 2013-08-29: 1000 mL via INTRAVENOUS

## 2013-08-29 MED ORDER — CLOPIDOGREL BISULFATE 75 MG PO TABS
75.0000 mg | ORAL_TABLET | Freq: Every day | ORAL | Status: DC
  Administered 2013-09-04: 75 mg via ORAL
  Filled 2013-08-29 (×7): qty 1

## 2013-08-29 NOTE — ED Notes (Signed)
To ED via [facility] from home, with son c/o altered LOC. Son states last saw normal at 12n-- he went out and c=when he came back, pt wasn't responding. On arrival to ED, awake, aphasic, unable to answer questions, does follow commands.  Pt -- incontinent of urine and stool-- (Incontinence brief saturated with large amount of foul smelling urine) clothing is wet with urine. Bathed upon arrival to room.

## 2013-08-29 NOTE — H&P (Signed)
Family Medicine Teaching [facility] Admission History and Physical Service Pager: [phone]  Patient name: [name] Medical record number: [id number] Date of birth: [birth year] Age: 79 y.o. Gender: female  Primary Care Provider: Elvina Sidle, MD, neurologist at [facility] Consultants: Neuro Code Status: Full  Chief Complaint: acute aphasia, AMS, unilateral weakness  Assessment and Plan: [name] is a 79 y.o. female presenting with expressive aphasia and left sided weakness that started this afternoon.  PMH is significant for HTN, dementia, gastric ca, PUD, and stroke in 2003 (takes ASA 81). Already appears to be improving.  #Neuro:  Stroke vs TIA: Pt. Is with clinical symptoms of acute stroke. Code stroke was initiated in the ED and Neurology evaluated the patient. Aphasia has slightly improved since onset today at 1 pm. Admitted to Neuro floor on telemetry.  - Neuro: presenting with probable left MCA subcortical TIA. Acute small vessel subcortical stroke cannot be ruled out, however. - Neuro checks Q2, TIA/stroke admission order set followed.  - Risk stratification labs ordered: HgbA1c, fasting lipid panel  - MRI, MRA of the brain without contrast, carotid dopplers ordered - PT consult, OT consult, Speech consult  - Echo:SR, RBBB; repeat in the am - Plavix for anticoagulation per neuro - CBC and CMP repeated in the AM; trop cycled, CBG Q6 - start atorvastatin after NPO status cleared.   Dementia: Patient has local neurologist for h/o of TIA and Dementia. TIA occurred in 2003, pt was on daily ASA (81). - Patient is mildly demented. She is taking Namenda and Aricept daily. Currently those meds are being held until swallow evaluation is completed. Will restart when capable.   #GU:  UTI: urine was obtained in ED, does not appear to be a clean catch or cath with many squamous cells, large leuks and negative nitrites. Ceftriaxone IV was administered in the ED. - Continued  ceftriaxone x3d  #CV:  H/O HTN: Patient currently only prescribed Lasix for what her son states was for leg edema. She has been prescribed no cardiac medications. Patient with elevated BP 163/88. VSS. - allow for permissive HTN currently with parameters of >200/100 to call FPTS and administer low dose hydralazine.  FEN/GI: NPO, until swallow study Prophylaxis: Hep SQ  Disposition: Pending clinical stabilization and Neuro recommendations.   History of Present Illness: [name] is a 79 y.o. female presenting with sudden onset of trouble speaking and left sided weakness this afternoon around 1:15pm. PMH significant for TIA (2003), HTN and dementia. Per son, mother had been feeling fine when he left the house at noon. Upon returning he found his mother lying in bed, he noted her to be inattentive, attempting to speak but unable to form words. Also at that time noted right sided facial droop and right upper extremity weakness. Additionally noted to be incontinent of bowel and bladder, however this is a chronic issue for patient.  No seizure like activity. He states prirt to this episode she was her normal self, normal appetite (small) and acting appropriately. Was brought to ED for concerns of stroke.   Upon arrival to ED code stroke was initiated. Patient to CT scan without evidence of acute intracranial process. Neuro eval non focal. Patient noted to be improving with regards to speech, but still with some mild dysarthria. NIH stroke score 2. Patient's urine was noted to be particularly smelly and temp mildly hypothermic 96.9. Full septic w/up initiated. Of note pt with recent UTIs three 3wks ago. She denies any current pain, and  does not recall any discomfort this afternoon. She reports she felt like her son was just waking her up from a nap and she could not speak.   Review Of Systems: Per HPI with the following additions: some memory loss assoc with dementia but fairly good short term memory,  incontinence of both bladder and bowel regularly Otherwise 12 point review of systems was performed and was unremarkable.  Patient Active Problem List   Diagnosis Date Noted  . TIA (transient ischemic attack) 08/29/2013  . Hypertension 08/29/2013  . Fall from standing 03/25/2012  . Fracture, intertrochanteric, left femur 03/25/2012  . Dementia 03/25/2012  . Lung nodule 05/24/2011   Past Medical History: Past Medical History  Diagnosis Date  . Dementia   . Hypertension   . Ulcer     Hx of  . Stomach cancer   . Snoring   . Fracture, intertrochanteric, left femur 03/25/2012  . Fracture, intertrochanteric, right femur 06/2008    s/p closed reduction and internal fixation  . Nodule of left lung     followed by Dr. Vassie Loll // LLL spiculated nodule noted per CT in 2012 1.5x1.6x2.4, PET in 06/03/2011 was low uptake.   Past Surgical History: Past Surgical History  Procedure Laterality Date  . Stomach surgery      3/4 of stomach removed due to stomach cancer  . Hip surgery  2009    right  . Total hip arthroplasty  03/29/2012    Procedure: TOTAL HIP ARTHROPLASTY;  Surgeon: Eulas Post, MD;  Location: [facility] OR;  Service: Orthopedics;  Laterality: Left;  Left Hip Hemiarthroplasty   Social History: History  Substance Use Topics  . Smoking status: Former Smoker -- 0.30 packs/day for 2 years    Types: Cigarettes    Quit date: 12/12/1973  . Smokeless tobacco: Never Used  . Alcohol Use: Yes     Comment: rare   Additional social history: lives at home with son, able to walk around with support from son or stroller at home Please also refer to relevant sections of EMR.  Family History: Family History  Problem Relation Age of Onset  . Cancer Father   . Liver cancer Son   . Allergies     Allergies and Medications: No Known Allergies No current facility-administered medications on file prior to encounter.   Current Outpatient Prescriptions on File Prior to Encounter  Medication Sig  Dispense Refill  . aspirin 81 MG tablet Take 81 mg by mouth daily.        . calcitRIOL (ROCALTROL) 1 MCG/ML solution Take 0.25 mcg by mouth daily.      . cephALEXin (KEFLEX) 500 MG capsule Take 1 capsule (500 mg total) by mouth 3 (three) times daily.  30 capsule  0  . ciprofloxacin (CIPRO) 500 MG tablet Take 1 tablet (500 mg total) by mouth 2 (two) times daily.  14 tablet  0  . donepezil (ARICEPT) 10 MG tablet TAKE 1 TABLET EACH DAY.  30 tablet  1  . furosemide (LASIX) 20 MG tablet TAKE 1 TABLET IN THE MORNING.  30 tablet  0  . memantine (NAMENDA) 10 MG tablet Take 10 mg by mouth 2 (two) times daily.       . Multiple Vitamins-Minerals (EQL GUMMY ADULT PO) Take 1 each by mouth daily.        . silver sulfADIAZINE (SILVADENE) 1 % cream Apply topically daily.  400 g  0  . sodium bicarbonate 650 MG tablet Take 650 mg by  mouth 4 (four) times daily.      . vitamin B-12 (CYANOCOBALAMIN) 1000 MCG tablet Take 1,000 mcg by mouth every Monday, Wednesday, and Friday. Three times a week        Objective: BP 191/70  Pulse 59  Temp(Src) 97.7 F (36.5 C) (Oral)  Resp 16  SpO2 98% Exam: Gen: Distressed. Concerned about speech. Pleasant and able to smile and laugh.  HEENT: AT. [place]. Bilateral TM unable to be visualized due to cerumen impaction.  Bilateral eyes without injections or icterus. Dry mucous membranes. Bilateral nares normal. Throat without erythema or exudates.  CV: RRR. No murmur appreciated Chest: Mild bibasilar crackles. No wheeze, normal WOB. Abd: Soft.  NTND. BS present. No Masses palpated. Midline scar from stomach resection (cancer) Ext: No erythema. No edema.  Skin: No rashes, purpura or petechiae. No skin breakdown appreciated Neuro: PERLA. EOMi. Alert. Grossly intact, with the exception of patient no responding to facial sensation bilaterally in all tested locations. Upon smiling right side of face appeared mildly drooped in comparison to left. Speech was improving and words could be  understood, although slurred. Patient moved around in bed and cooperated with entire exam. Finger to nose normal. Strength 5/5 bilateral.    Labs and Imaging: CBC BMET   Recent Labs Lab 08/29/13 1357 08/29/13 1412  WBC 6.7  --   HGB 11.5* 12.6  HCT 36.3 37.0  PLT 243  --      Recent Labs Lab 08/29/13 1357 08/29/13 1412  NA 137 141  K 3.8 3.9  CL 100 107  CO2 24  --   BUN 41* 40*  CREATININE 1.73* 1.80*  GLUCOSE 109* 95  CALCIUM 9.1  --      Urinalysis    Component Value Date/Time   COLORURINE YELLOW 08/29/2013 1439   APPEARANCEUR TURBID* 08/29/2013 1439   LABSPEC 1.010 08/29/2013 1439   PHURINE 8.0 08/29/2013 1439   GLUCOSEU NEGATIVE 08/29/2013 1439   HGBUR MODERATE* 08/29/2013 1439   BILIRUBINUR NEGATIVE 08/29/2013 1439   BILIRUBINUR neg 07/29/2013 2112   KETONESUR NEGATIVE 08/29/2013 1439   PROTEINUR 100* 08/29/2013 1439   UROBILINOGEN 0.2 08/29/2013 1439   UROBILINOGEN 0.2 07/29/2013 2112   NITRITE NEGATIVE 08/29/2013 1439   NITRITE neg 07/29/2013 2112   LEUKOCYTESUR LARGE* 08/29/2013 1439   Ct Head Wo Contrast  08/29/2013   CLINICAL DATA:  Altered mental status ; pinpoint pupils  EXAM: CT HEAD WITHOUT CONTRAST  TECHNIQUE: Contiguous axial images were obtained from the base of the skull through the vertex without intravenous contrast. Study was obtained within 24 hr of patient's arrival at the emergency department.  COMPARISON:  Brain MRI January 04, 2008  FINDINGS: There is moderate diffuse atrophy. There is no mass, hemorrhage, extra-axial fluid collection, or midline shift. There is patchy small vessel disease in the centra semiovale bilaterally. These changes are greatest in the posterior centra semiovale, slightly more severe on the right than on the left. No acute appearing infarct is seen on this study. There is no new gray-white compartment lesion.  Bony calvarium appears intact. The mastoid air cells are clear.  IMPRESSION: Atrophy with fairly extensive small vessel  disease. No demonstrable mass, hemorrhage, or acute appearing infarct.  CriticalValue/emergent results were called by telephone at the time of interpretation on 08/29/2013 at [facility] , who verbally acknowledged these results.   Electronically Signed   By: Bretta Bang   On: 08/29/2013 14:11     Felix Pacini PGY-2, [facility] pager: [phone], text pages welcome

## 2013-08-29 NOTE — Consult Note (Signed)
Referring Physician: Dr. Wilkie Aye    Chief Complaint: Acute onset of inability to speak.  HPI: [name] is an 79 y.o. female with a history of hypertension, dementia, stomach cancer and peptic ulcer disease, presenting with new onset of inability to speak. Patient reportedly was inattentive to those around her and attempted to speak but could not utter any sounds and was aware that she could not speak. She was last seen well at noon today. His previous history of stroke in 2003. She's been on aspirin 81 mg per day. CT scan of her head showed no acute intracranial abnormality. Her ability to speak improved markedly while being evaluated in the emergency room. She had mild residual dysarthria. She also did not remember her correct age. NIH stroke score was 2. Patient had foul-smelling urine and was hypothermic with a rectal temperature of 96.9. She's being admitted for TIA workup as well as for possible urosepsis.  LSN: Noon on 08/29/2013 tPA Given: No: Rapidly resolving deficits.  MRankin: 2  Past Medical History  Diagnosis Date  . Dementia   . Hypertension   . Ulcer     Hx of  . Stomach cancer   . Snoring   . Fracture, intertrochanteric, left femur 03/25/2012  . Fracture, intertrochanteric, right femur 06/2008    s/p closed reduction and internal fixation  . Nodule of left lung     followed by Dr. Vassie Loll // LLL spiculated nodule noted per CT in 2012 1.5x1.6x2.4, PET in 06/03/2011 was low uptake.    Family History  Problem Relation Age of Onset  . Cancer Father   . Liver cancer Son   . Allergies       Medications: I have reviewed the patient's current medications.  ROS: History obtained from child  General ROS: negative for - chills, fatigue, fever, night sweats, weight gain or weight loss Psychological ROS: Positive for chronic dementia Ophthalmic ROS: negative for - blurry vision, double vision, eye pain or loss of vision ENT ROS: negative for - epistaxis, nasal discharge,  oral lesions, sore throat, tinnitus or vertigo Allergy and Immunology ROS: negative for - hives or itchy/watery eyes Hematological and Lymphatic ROS: negative for - bleeding problems, bruising or swollen lymph nodes Endocrine ROS: negative for - galactorrhea, hair pattern changes, polydipsia/polyuria or temperature intolerance Respiratory ROS: negative for - cough, hemoptysis, shortness of breath or wheezing Cardiovascular ROS: negative for - chest pain, dyspnea on exertion, edema or irregular heartbeat Gastrointestinal ROS: Known incontinence of stool, mostly at night Genito-Urinary ROS: Known urinary incontinence Musculoskeletal ROS: negative for - joint swelling or muscular weakness Neurological ROS: as noted in HPI Dermatological ROS: negative for rash and skin lesion changes  Physical Examination: Blood pressure 163/88, pulse 55, temperature 96.9 F (36.1 C), temperature source Rectal, SpO2 99.00%.  Neurologic Examination: Mental Status: Alert, oriented oriented to time but did not remember her correct age.  Speech slightly dysarthric without evidence of residual aphasia. Able to follow commands without difficulty. Cranial Nerves: II-Visual fields were normal. III/IV/VI-Pupils were equal and reacted. Extraocular movements were full and conjugate.    V/VII-no facial numbness and no facial weakness. VIII-normal. X-no dysarthria. Motor: 5/5 bilaterally with normal tone and bulk Sensory: Normal throughout. Deep Tendon Reflexes: 2+ and symmetric; moderate frontal release signs. Plantars: Flexor bilaterally Cerebellar: Normal finger-to-nose testing.  Ct Head Wo Contrast  08/29/2013   CLINICAL DATA:  Altered mental status ; pinpoint pupils  EXAM: CT HEAD WITHOUT CONTRAST  TECHNIQUE: Contiguous axial images were obtained  from the base of the skull through the vertex without intravenous contrast. Study was obtained within 24 hr of patient's arrival at the emergency department.   COMPARISON:  Brain MRI January 04, 2008  FINDINGS: There is moderate diffuse atrophy. There is no mass, hemorrhage, extra-axial fluid collection, or midline shift. There is patchy small vessel disease in the centra semiovale bilaterally. These changes are greatest in the posterior centra semiovale, slightly more severe on the right than on the left. No acute appearing infarct is seen on this study. There is no new gray-white compartment lesion.  Bony calvarium appears intact. The mastoid air cells are clear.  IMPRESSION: Atrophy with fairly extensive small vessel disease. No demonstrable mass, hemorrhage, or acute appearing infarct.  CriticalValue/emergent results were called by telephone at the time of interpretation on 08/29/2013 at [facility] , who verbally acknowledged these results.   Electronically Signed   By: Bretta Bang   On: 08/29/2013 14:11    Assessment: 79 y.o. female of hypertension and dementia presenting with probable left MCA subcortical TIA. Acute small vessel subcortical stroke cannot be ruled out, however.  Stroke Risk Factors - hypertension  Plan: 1. HgbA1c, fasting lipid panel 2. MRI, MRA  of the brain without contrast 3. PT consult, OT consult, Speech consult 4. Echocardiogram 5. Carotid dopplers 6. Prophylactic therapy-Antiplatelet med: Plavix 75 mg per day 7. Risk factor modification 8. Telemetry monitoring   C.R. Roseanne Reno, MD [facility] [phone]  08/29/2013, 2:51 PM

## 2013-08-29 NOTE — ED Provider Notes (Signed)
CSN: [id number]     Arrival date & time 08/29/13  1346 History   First MD Initiated Contact with Patient 08/29/13 1355     Chief Complaint  Patient presents with  . Altered Mental Status   (Consider location/radiation/quality/duration/timing/severity/associated sxs/prior Treatment) HPI This is an 79 year old female with a history of dementia, hypertension, and TIA who presents as a code stroke. Per EMS report, they were called to the home for expressive aphasia. His son states that he last saw his mom normal at noon it. He went and ran some errands and by 1:15 when he checked on his mother, she was unable to speak. The son also reports left-sided facial droop and left upper extremity weakness.  Upon presentation, patient was initially moaning. She was sent to the CT scanner without evidence of acute bleed. Patient had generalized improvement of her symptoms. She is currently alert and oriented. She denies any pain. She is but is incontinent of stool and urine. Urine has a foul odor. No reported fevers at home.  Neurology is at the bedside. Patient is nonfocal on exam this time and symptoms appear to have resolved.  They recommend sepsis workup an inpatient admission. MRI will be obtained as an inpatient. Past Medical History  Diagnosis Date  . Dementia   . Hypertension   . Ulcer     Hx of  . Stomach cancer   . Snoring   . Fracture, intertrochanteric, left femur 03/25/2012  . Fracture, intertrochanteric, right femur 06/2008    s/p closed reduction and internal fixation  . Nodule of left lung     followed by Dr. Vassie Loll // LLL spiculated nodule noted per CT in 2012 1.5x1.6x2.4, PET in 06/03/2011 was low uptake.   Past Surgical History  Procedure Laterality Date  . Stomach surgery      3/4 of stomach removed due to stomach cancer  . Hip surgery  2009    right  . Total hip arthroplasty  03/29/2012    Procedure: TOTAL HIP ARTHROPLASTY;  Surgeon: Eulas Post, MD;  Location: [facility] OR;  Service:  Orthopedics;  Laterality: Left;  Left Hip Hemiarthroplasty   Family History  Problem Relation Age of Onset  . Cancer Father   . Liver cancer Son   . Allergies     History  Substance Use Topics  . Smoking status: Former Smoker -- 0.30 packs/day for 2 years    Types: Cigarettes    Quit date: 12/12/1973  . Smokeless tobacco: Never Used  . Alcohol Use: Yes     Comment: rare   OB History   Grav Para Term Preterm Abortions TAB SAB Ect Mult Living                 Review of Systems  Unable to perform ROS: Dementia   level V caveat applies, patient denies any pain  Allergies  Review of patient's allergies indicates no known allergies.  Home Medications   Current Outpatient Rx  Name  Route  Sig  Dispense  Refill  . aspirin 81 MG tablet   Oral   Take 81 mg by mouth daily.           . calcitRIOL (ROCALTROL) 1 MCG/ML solution   Oral   Take 0.25 mcg by mouth daily.         . cephALEXin (KEFLEX) 500 MG capsule   Oral   Take 1 capsule (500 mg total) by mouth 3 (three) times daily.   30 capsule  0   . ciprofloxacin (CIPRO) 500 MG tablet   Oral   Take 1 tablet (500 mg total) by mouth 2 (two) times daily.   14 tablet   0   . donepezil (ARICEPT) 10 MG tablet      TAKE 1 TABLET EACH DAY.   30 tablet   1   . furosemide (LASIX) 20 MG tablet      TAKE 1 TABLET IN THE MORNING.   30 tablet   0   . memantine (NAMENDA) 10 MG tablet   Oral   Take 10 mg by mouth 2 (two) times daily.          . Multiple Vitamins-Minerals (EQL GUMMY ADULT PO)   Oral   Take 1 each by mouth daily.           . silver sulfADIAZINE (SILVADENE) 1 % cream   Topical   Apply topically daily.   400 g   0   . sodium bicarbonate 650 MG tablet   Oral   Take 650 mg by mouth 4 (four) times daily.         . vitamin B-12 (CYANOCOBALAMIN) 1000 MCG tablet   Oral   Take 1,000 mcg by mouth every Monday, Wednesday, and Friday. Three times a week          BP 191/70  Pulse 59  Temp(Src)  97.7 F (36.5 C) (Oral)  Resp 16  SpO2 98% Physical Exam  Nursing note and vitals reviewed. Constitutional: She is oriented to person, place, and time.  Elderly, thin  HENT:  Head: Normocephalic and atraumatic.  mucous membranes dry  Eyes: Pupils are equal, round, and reactive to light.  Neck: Neck supple.  Cardiovascular: Normal rate, regular rhythm and normal heart sounds.   No murmur heard. Pulmonary/Chest: Effort normal and breath sounds normal. No respiratory distress. She has no wheezes.  Abdominal: Soft. Bowel sounds are normal. There is no tenderness.  Musculoskeletal: She exhibits no edema.  Neurological: She is alert and oriented to person, place, and time.  Cranial nerves II through XII intact, coordination intact to finger nose finger, 5 out of 5 bilateral upper and lower extremity strength  Skin: Skin is warm and dry.  Psychiatric: She has a normal mood and affect.    ED Course  Procedures (including critical care time) Labs Review Labs Reviewed  CBC - Abnormal; Notable for the following:    RBC 3.78 (*)    Hemoglobin 11.5 (*)    All other components within normal limits  COMPREHENSIVE METABOLIC PANEL - Abnormal; Notable for the following:    Glucose, Bld 109 (*)    BUN 41 (*)    Creatinine, Ser 1.73 (*)    Albumin 3.1 (*)    GFR calc non Af Amer 26 (*)    GFR calc Af Amer 30 (*)    All other components within normal limits  URINALYSIS, ROUTINE W REFLEX MICROSCOPIC - Abnormal; Notable for the following:    APPearance TURBID (*)    Hgb urine dipstick MODERATE (*)    Protein, ur 100 (*)    Leukocytes, UA LARGE (*)    All other components within normal limits  URINE MICROSCOPIC-ADD ON - Abnormal; Notable for the following:    Squamous Epithelial / LPF MANY (*)    Bacteria, UA MANY (*)    All other components within normal limits  POCT I-STAT, CHEM 8 - Abnormal; Notable for the following:    BUN 40 (*)  Creatinine, Ser 1.80 (*)    All other components  within normal limits  URINE CULTURE  CULTURE, BLOOD (ROUTINE X 2)  CULTURE, BLOOD (ROUTINE X 2)  ETHANOL  PROTIME-INR  APTT  DIFFERENTIAL  TROPONIN I  URINE RAPID DRUG SCREEN (HOSP PERFORMED)  GLUCOSE, CAPILLARY  POCT I-STAT TROPONIN I  OCCULT BLOOD, POC DEVICE   Imaging Review Ct Head Wo Contrast  08/29/2013   CLINICAL DATA:  Altered mental status ; pinpoint pupils  EXAM: CT HEAD WITHOUT CONTRAST  TECHNIQUE: Contiguous axial images were obtained from the base of the skull through the vertex without intravenous contrast. Study was obtained within 24 hr of patient's arrival at the emergency department.  COMPARISON:  Brain MRI January 04, 2008  FINDINGS: There is moderate diffuse atrophy. There is no mass, hemorrhage, extra-axial fluid collection, or midline shift. There is patchy small vessel disease in the centra semiovale bilaterally. These changes are greatest in the posterior centra semiovale, slightly more severe on the right than on the left. No acute appearing infarct is seen on this study. There is no new gray-white compartment lesion.  Bony calvarium appears intact. The mastoid air cells are clear.  IMPRESSION: Atrophy with fairly extensive small vessel disease. No demonstrable mass, hemorrhage, or acute appearing infarct.  CriticalValue/emergent results were called by telephone at the time of interpretation on 08/29/2013 at [facility] , who verbally acknowledged these results.   Electronically Signed   By: Bretta Bang   On: 08/29/2013 14:11   EKG independently reviewed by myself: Sinus rhythm with a rate of 56, right bundle branch block noted which is unchanged from prior MDM   1. TIA (transient ischemic attack)   2. Dementia   3. Hypertension   4. Urinary tract infection    Patient presented as a code stroke. Her symptoms have since resolved. She appears neurologically intact at this time. Exam was notable for foul-smelling urine and the patient has a history  of urinary tract infections. CT scan is negative for acute infarct. Neurology recommends inpatient TIA workup. Blood cultures and urine cultures were sent as the patient was noted to be mildly hypothermic. Patient has evidence of UTI on her urine. Patient was given Rocephin based on prior urine cultures. She will be admitted for further management.    Shon Baton, MD 08/29/13 1630

## 2013-08-29 NOTE — ED Notes (Signed)
Family at bedside. 

## 2013-08-29 NOTE — ED Notes (Signed)
CBG 91 

## 2013-08-29 NOTE — Code Documentation (Signed)
78 yo female who lives at home with her son. He saw her in her usual state of health at 1200 when he left for a run. When he returned at 1307 he found her unable to speak, and with LUE and L facial weakness. EMS was called. They described the environment as foul smelling and like a hoarder's house. Code stroke was activated at 1342. Pt arrived at 1346 and was met at the bridge by EDP. Labs were drawn at the bridge. Pt is drooling from L side of mouth and unable to speak, though she does make facial expressions indicating she understands.  She is moving all 4s spontaneously and follows commands. The pt wreaks of urine and stool.Taken to CT which showed no acute abnormality. NIHSS is 2: she missed the month and is sl dysarthric, apparently much improved from the paramedics' findings. Cleaned of incontinence; now smiling and verbal, though only answering questions and not really initiating conversation.  She has improved too much for tPA, but still in the window until 3 pm....discussed in handoff with the ED RN.  Pt is supposed to get a B12 injection every month.  Son states, " I may have forgotten 1 or 2....maybe 3 months since she has had one."

## 2013-08-30 ENCOUNTER — Inpatient Hospital Stay (HOSPITAL_COMMUNITY): Payer: Medicare Other

## 2013-08-30 DIAGNOSIS — I359 Nonrheumatic aortic valve disorder, unspecified: Secondary | ICD-10-CM

## 2013-08-30 LAB — CBC WITH DIFFERENTIAL/PLATELET
Basophils Relative: 0 % (ref 0–1)
HCT: 34.1 % — ABNORMAL LOW (ref 36.0–46.0)
Hemoglobin: 11.3 g/dL — ABNORMAL LOW (ref 12.0–15.0)
Lymphs Abs: 0.9 10*3/uL (ref 0.7–4.0)
MCH: 31.4 pg (ref 26.0–34.0)
MCHC: 33.1 g/dL (ref 30.0–36.0)
Monocytes Absolute: 0.6 10*3/uL (ref 0.1–1.0)
Monocytes Relative: 6 % (ref 3–12)
Neutro Abs: 8.8 10*3/uL — ABNORMAL HIGH (ref 1.7–7.7)
Neutrophils Relative %: 84 % — ABNORMAL HIGH (ref 43–77)
RBC: 3.6 MIL/uL — ABNORMAL LOW (ref 3.87–5.11)

## 2013-08-30 LAB — PROTIME-INR
INR: 0.99 (ref 0.00–1.49)
Prothrombin Time: 12.9 seconds (ref 11.6–15.2)

## 2013-08-30 LAB — COMPREHENSIVE METABOLIC PANEL
Alkaline Phosphatase: 102 U/L (ref 39–117)
BUN: 37 mg/dL — ABNORMAL HIGH (ref 6–23)
Chloride: 107 mEq/L (ref 96–112)
Creatinine, Ser: 1.6 mg/dL — ABNORMAL HIGH (ref 0.50–1.10)
GFR calc Af Amer: 33 mL/min — ABNORMAL LOW (ref >90)
GFR calc non Af Amer: 28 mL/min — ABNORMAL LOW (ref >90)
Glucose, Bld: 104 mg/dL — ABNORMAL HIGH (ref 70–99)
Potassium: 3.7 mEq/L (ref 3.5–5.1)
Total Bilirubin: 0.2 mg/dL — ABNORMAL LOW (ref 0.3–1.2)

## 2013-08-30 LAB — TROPONIN I: Troponin I: <0.3 ng/mL (ref <0.30)

## 2013-08-30 LAB — URINE CULTURE
Colony Count: NO GROWTH
Culture: NO GROWTH
Special Requests: NORMAL

## 2013-08-30 LAB — LIPID PANEL
HDL: 87 mg/dL (ref >39)
Total CHOL/HDL Ratio: 2.5 RATIO
VLDL: 17 mg/dL (ref 0–40)

## 2013-08-30 LAB — GLUCOSE, CAPILLARY
Glucose-Capillary: 92 mg/dL (ref 70–99)
Glucose-Capillary: 108 mg/dL — ABNORMAL HIGH (ref 70–99)
Glucose-Capillary: 124 mg/dL — ABNORMAL HIGH (ref 70–99)

## 2013-08-30 MED ORDER — DILTIAZEM HCL 100 MG IV SOLR
5.0000 mg/h | INTRAVENOUS | Status: DC
  Administered 2013-08-30: 10 mg/h via INTRAVENOUS
  Administered 2013-08-30: 5 mg/h via INTRAVENOUS
  Administered 2013-08-31: 15 mg/h via INTRAVENOUS
  Filled 2013-08-30 (×2): qty 100

## 2013-08-30 MED ORDER — ASPIRIN 300 MG RE SUPP
300.0000 mg | Freq: Every day | RECTAL | Status: DC
  Administered 2013-08-30 – 2013-09-04 (×6): 300 mg via RECTAL
  Filled 2013-08-30 (×7): qty 1

## 2013-08-30 NOTE — Progress Notes (Signed)
FPTS INTERIM PROGRESS NOTE  Called by nurse for elevated heart rate in 150s  Subjective: Patient sitting up in chair with family at bedside. Patient still unable to speak. She nods her head to feeling heart racing, pain, and feeling short of breath. After EKG performed she started to spit up a lot of saliva which was suctioned out since she is unable to clear it herself.  Objective: Filed Vitals:   08/30/13 1020 08/30/13 1428 08/30/13 1800 08/30/13 2006  BP: 156/53 160/51 145/60 137/91  Pulse: 72 76 73 98  Temp: 97.6 F (36.4 C) 98 F (36.7 C) 98 F (36.7 C) 98.4 F (36.9 C)  TempSrc: Oral Oral Oral Oral  Resp: 20 20 18 18   Height:      Weight:      SpO2: 98% 98% 99% 96%   Oxygen saturation during exam 97-99%  General: No apparent distress, but patient cannot verbalize anything.  HEENT: clear mucous/saliva in mouth CV: irregularly irregular rate, tachycardic. Normal heart sounds and no murmur Resp: CTAB, no increased WOB Extremities: 2+ radial and PT pulses bilaterally  EKG: Rate 166. PR . QRS . RBBB still present. Appears to be afib with RVR.   A/P: New onset afib with RVR.  -Start diltiazem drip, will need to be transferred to stepdown unit -Cycle troponins q6h x 3 -Continue to monitor vitals closely  Tawni Carnes, MD 08/30/2013, 8:26 PM PGY-1, [facility] FPTS Intern Pager: [phone], text pages welcome

## 2013-08-30 NOTE — Progress Notes (Signed)
  Echocardiogram 2D Echocardiogram has been performed.  Akshar Starnes FRANCES 08/30/2013, 9:49 AM

## 2013-08-30 NOTE — Evaluation (Signed)
Physical Therapy Evaluation Patient Details Name: [name] MRN: [id number] DOB: Mar 05, 1926 Today's Date: 08/30/2013 Time: 4098-1191 PT Time Calculation (min): 29 min  PT Assessment / Plan / Recommendation History of Present Illness  79 y.o. female admitted to [facility] on 08/29/13 with acute onset aphasia and per H&P left sided weakness.  Also, of note, she also has a UTI.  MRI still pending.   Clinical Impression  Pt seems to be pretty close to her mobility baseline, generally weak, hand held assist to walk. H/o bil hip fxs.  She is at decent risk for falls and would benefit from [facility] PT at discharge to help increase her strength and decrease her fall risk.  I am not sure how easy it would be to get her to OP setting.      PT Assessment  Patient needs continued PT services    Follow Up Recommendations  Home health PT;Supervision/Assistance - 24 hour    Does the patient have the potential to tolerate intense rehabilitation     Yes  Barriers to Discharge Other (comment) (None) None    Equipment Recommendations  None recommended by PT    Recommendations for Other Services  (None)   Frequency Min 4X/week    Precautions / Restrictions Precautions Precautions: Fall Precaution Comments: pt walked with hand held assist for all mobility PTA   Pertinent Vitals/Pain See vitals flow sheet.       Mobility  Bed Mobility Bed Mobility: Supine to Sit;Sitting - Scoot to Edge of Bed Supine to Sit: 4: Min assist;With rails;HOB flat Sitting - Scoot to [place] of Bed: 4: Min assist;With rail Details for Bed Mobility Assistance: min assist to pull to sitting with hands and min assist to provide support for her to pull out to sitting EOB.   Transfers Transfers: Sit to Stand;Stand to Sit Sit to Stand: 4: Min assist;With upper extremity assist;With armrests;From bed Stand to Sit: 4: Min assist;With upper extremity assist;With armrests;To chair/3-in-1 Details for Transfer Assistance: pt min assist to  support trunk over weak and stiff legs.  Did better once she got up and moved around a little bit.   Ambulation/Gait Ambulation/Gait Assistance: 4: Min assist Ambulation Distance (Feet): 15 Feet Assistive device: 1 person hand held assist Ambulation/Gait Assistance Details: min assist to stabilize trunk for balance and support over weak legs.  Pt also improved with gait with increased distance.  Son reports that she has broken both hips most recently the right hip.   Gait Pattern: Step-through pattern;Shuffle;Trunk flexed Gait velocity: less than 1.8 ft/sec wich indicates risk for recurrent falls General Gait Details: verbal cues for upright posture Modified Rankin (Stroke Patients Only) Pre-Morbid Rankin Score: Moderately severe disability Modified Rankin: Moderately severe disability    Exercises General Exercises - Upper Extremity Shoulder Flexion: AAROM;Both;Seated Elbow Flexion: AAROM;Both;15 reps;Seated General Exercises - Lower Extremity Ankle Circles/Pumps: AROM;Both;10 reps;Seated Long Arc Quad: AROM;Both;10 reps;Seated Hip ABduction/ADduction: AROM;Both;10 reps;Seated Hip Flexion/Marching: AROM;Both;10 reps;Seated   PT Diagnosis: Difficulty walking;Abnormality of gait;Generalized weakness  PT Problem List: Decreased strength;Decreased activity tolerance;Decreased mobility;Decreased balance PT Treatment Interventions: DME instruction;Gait training;Functional mobility training;Therapeutic activities;Therapeutic exercise     PT Goals(Current goals can be found in the care plan section) Acute Rehab PT Goals Patient Stated Goal: to go home, possibly if able to go on their trip to [place] and [place] still.   PT Goal Formulation: With patient/family Time For Goal Achievement: 09/13/13 Potential to Achieve Goals: Good  Visit Information  Last PT Received On: 08/30/13 Assistance  Needed: +1 History of Present Illness: 79 y.o. female admitted to [facility] on 08/29/13 with acute onset  aphasia and per H&P left sided weakness.  Also, of note, she also has a UTI.  MRI still pending.        Prior Functioning  Home Living Family/patient expects to be discharged to:: Private residence Living Arrangements: Children (son lives with her) Available Help at Discharge: Family;Available 24 hours/day Type of Home: House Home Access: Ramped entrance Home Layout: Two level;Able to live on main level with bedroom/bathroom Home Equipment: Dan Humphreys - 2 wheels;Cane - single point;Shower seat Prior Function Level of Independence: Needs assistance Gait / Transfers Assistance Needed: min hand held assist for all gait ADL's / Homemaking Assistance Needed: assist needed to get into the shower, but not assist with bathing.  Son does cooking/cleaning.   Communication / Swallowing Assistance Needed: none PTA Comments: Son reports they were suppose to drive to [place] and [employer] for a trip next week.   Communication Communication: Expressive difficulties    Cognition  Cognition Arousal/Alertness: Awake/alert Behavior During Therapy: WFL for tasks assessed/performed Overall Cognitive Status: Difficult to assess Difficult to assess due to: Impaired communication    Extremity/Trunk Assessment Upper Extremity Assessment Upper Extremity Assessment: Defer to OT evaluation Lower Extremity Assessment Lower Extremity Assessment: Generalized weakness (no obvious difference L vs R with bedside MMT) Cervical / Trunk Assessment Cervical / Trunk Assessment: Normal   Balance Balance Balance Assessed: Yes Static Sitting Balance Static Sitting - Balance Support: Bilateral upper extremity supported;Feet supported Static Sitting - Level of Assistance: 5: Stand by assistance Static Standing Balance Static Standing - Balance Support: Right upper extremity supported Static Standing - Level of Assistance: 4: Min assist Dynamic Standing Balance Dynamic Standing - Balance Support: Right upper extremity  supported Dynamic Standing - Level of Assistance: 4: Min assist  End of Session PT - End of Session Equipment Utilized During Treatment: Gait belt Activity Tolerance: Patient limited by fatigue Patient left: in chair;with call bell/phone within reach;with family/visitor present    Lurena Joiner B. Viviene Thurston, PT, DPT [phone]   08/30/2013, 11:07 AM

## 2013-08-30 NOTE — Progress Notes (Signed)
UR complete.  Narelle Schoening RN, MSN 

## 2013-08-30 NOTE — Care Management Note (Signed)
    Page 1 of 2   09/05/2013     11:09:15 AM   CARE MANAGEMENT NOTE 09/05/2013  Patient:  [name], [name]   Account Number:  [id number]  Date Initiated:  08/30/2013  Documentation initiated by:  Elmer Bales  Subjective/Objective Assessment:   Patient admitted for acute aphasia, unilateral weakness, urosepsis. Lives at home with family.     Action/Plan:   Will follow for discharge needs.   Anticipated DC Date:  09/06/2013   Anticipated DC Plan:  IP REHAB FACILITY      DC Planning Services  CM consult      Choice offered to / List presented to:  C-4 Adult Children        HH arranged  HH-2 PT      HH agency  [facility]   Status of service:  Completed, signed off Medicare Important Message given?   (If response is "NO", the following Medicare IM given date fields will be blank) Date Medicare IM given:   Date Additional Medicare IM given:    Discharge Disposition:  IP REHAB FACILITY  Per UR Regulation:  Reviewed for med. necessity/level of care/duration of stay  If discussed at Long Length of Stay Meetings, dates discussed:   09/05/2013    Comments:  09/05/13- 1100- Donn Pierini RN, BSN [phone] Anticipate transfer to CIR today   09/03/13- 1030- Donn Pierini RN, BSN [phone] Pt positive for CVA- NPO per speech s/p MCA stroke; Family is considering NG tube for meds/feeds- CIR consulted - pt potential candidate- CIR RN to f/u with family.   08/30/13 1445 Elmer Bales RN, MSN, CM- Met with patient and family to discuss home health needs.  Patient has currently been evaluated by PT, but is still awaiting SLP and OT.  [place] with [facility] was notified of referral and pending evaluations. CM spoke with Greig Castilla with Family Medicine to notify of [facility] recommendation for [facility] at discharge as well due to patient's multiple health concerns.

## 2013-08-30 NOTE — H&P (Signed)
Seen and examined.  Discussed with Dr. Claiborne Billings.  Agree with her documentation and management.  Briefly 79 yo female with underlying mild dementia presents with aphasia and perhaps dysarthria.  The clinical picture is most consistent with an acute CVA.  (Could be TIA but she is still aphasic this morning.)  No clear weakness or numbness, the neuro deficit seems quite isolated.  She also has an incidental UTI (maybe - non cath urine).   We will travel down the familiar CVA/TIA pathway.  Await swallow eval before PO.  Will await work up results and Agricultural consultant.  I favor Plavix and ASA x 21 days. Then Plavix.  Likely will need statin.  This event is a good trigger to discuss end of life wishes/advanced directives.

## 2013-08-30 NOTE — Evaluation (Signed)
Occupational Therapy Evaluation Patient Details Name: ANNIS LAGOY MRN: [id number] DOB: [birth year] Today's Date: 08/30/2013 Time: 0865-7846 OT Time Calculation (min): 23 min  OT Assessment / Plan / Recommendation History of present illness 79 y.o. female admitted to [facility] on 08/29/13 with acute onset aphasia and per H&P left sided weakness.  Also, of note, she also has a UTI.  MRI (+) LT MCA acute infarct    Clinical Impression   PT admitted with aphasic and left side weakness. Pt currently with functional limitiations due to the deficits listed below (see OT problem list).  Pt will benefit from skilled OT to increase their independence and safety with adls and balance to allow discharge HHOT.     OT Assessment  Patient needs continued OT Services    Follow Up Recommendations  Home health OT    Barriers to Discharge      Equipment Recommendations  None recommended by OT    Recommendations for Other Services    Frequency  Min 2X/week    Precautions / Restrictions Precautions Precautions: Fall Precaution Comments: pt walked with hand held assist for all mobility PTA   Pertinent Vitals/Pain No pain reported    ADL  Grooming: Wash/dry hands;Wash/dry face;Set up Where Assessed - Grooming: Supported sitting Lower Body Dressing: Minimal assistance Where Assessed - Lower Body Dressing: Supported sit to Pharmacist, hospital: Minimal Dentist Method: Sit to Barista: Raised toilet seat with arms (or 3-in-1 over toilet) Equipment Used: Gait belt Transfers/Ambulation Related to ADLs: Pts son assisting during session to demonstrate toilet trasnfer and simulate as close to home as possible. Pt s son giving verbal instructions about posture and support. Son holding forearm out to create a bar like handle for pt to hold. pt holding bil UE on forearm and following son. Pt and son report this is normal ambuation. ADL Comments: Pt following all simple  commands, completing a visual assessment demosntrates figure closure. Pt completed a square, cicle and triangle  shape. Pt redrawing a house with example. pt asked to draw a clock and make it say 9 o clock . Pt provided a cicle. Pt drawing lines inside the circle to create 8 pieces. Pt writing out to teh side 9 o clock. Pt provided extra time to review drawing. pt then shown clock in room and asked "does your clock look like mine?" Pt states yes     OT Diagnosis: Generalized weakness;Disturbance of vision  OT Problem List: Decreased strength;Impaired balance (sitting and/or standing);Impaired vision/perception;Decreased activity tolerance;Decreased safety awareness;Decreased knowledge of use of DME or AE;Decreased knowledge of precautions OT Treatment Interventions: Self-care/ADL training;Therapeutic exercise;DME and/or AE instruction;Therapeutic activities;Visual/perceptual remediation/compensation;Patient/family education;Balance training   OT Goals(Current goals can be found in the care plan section) Acute Rehab OT Goals OT Goal Formulation: With patient Time For Goal Achievement: 09/13/13 Potential to Achieve Goals: Good  Visit Information  Last OT Received On: 08/30/13 Assistance Needed: +1 History of Present Illness: 79 y.o. female admitted to [facility] on 08/29/13 with acute onset aphasia and per H&P left sided weakness.  Also, of note, she also has a UTI.  MRI (+) LT MCA acute infarct        Prior Functioning     Home Living Family/patient expects to be discharged to:: Private residence Living Arrangements: Children (son Nida Boatman ) Available Help at Discharge: Family;Available 24 hours/day Type of Home: House Home Access: Ramped entrance Home Layout: Two level;Able to live on main level with bedroom/bathroom Home Equipment: Dan Humphreys -  2 wheels;Cane - single point;Shower seat Prior Function Level of Independence: Needs assistance Gait / Transfers Assistance Needed: min hand held assist for  all gait ADL's / Homemaking Assistance Needed: assist needed to get into the shower, but not assist with bathing.  Son does cooking/cleaning.   Communication / Swallowing Assistance Needed: none PTA Comments: Son reports they were suppose to drive to [place] and [employer] for a trip next week.   Communication Communication: Expressive difficulties Dominant Hand: Right         Vision/Perception Vision - History Baseline Vision: No visual deficits Patient Visual Report: No change from baseline   Cognition  Cognition Arousal/Alertness: Awake/alert Behavior During Therapy: WFL for tasks assessed/performed Overall Cognitive Status: Difficult to assess (pt following all commands 100% correct) Memory:  (responses to questions are 100% correct with son present ) Difficult to assess due to: Impaired communication    Extremity/Trunk Assessment Upper Extremity Assessment Upper Extremity Assessment: RUE deficits/detail RUE Deficits / Details: shoulder flexion 3 out 5, grasp 4 out 5, elbow flexion 4 out 5 - son reports Rt UE weaker than normal  RUE Coordination: decreased gross motor Lower Extremity Assessment Lower Extremity Assessment: Defer to PT evaluation Cervical / Trunk Assessment Cervical / Trunk Assessment: Normal     Mobility Bed Mobility Bed Mobility: Not assessed Transfers Sit to Stand: 4: Min assist;With upper extremity assist;With armrests Stand to Sit: 4: Min assist;With upper extremity assist;With armrests;To chair/3-in-1 Details for Transfer Assistance: son giving verbal cues and pt able to progress to static standing holding son's arm     Exercise     Balance     End of Session OT - End of Session Activity Tolerance: Patient tolerated treatment well Patient left: in chair;with call bell/phone within reach;with nursing/sitter in room;with family/visitor present Nurse Communication: Mobility status;Precautions  GO     Harolyn Rutherford 08/30/2013, 3:43 PM Pager:  [phone]

## 2013-08-30 NOTE — Progress Notes (Signed)
Family Medicine Teaching Service Daily Progress Note Intern Pager: [phone]  Patient name: [name] Medical record number: [id number] Date of birth: 06/28/1926 Age: 79 y.o. Gender: female  Primary Care Provider: Elvina Sidle, MD Consultants: Neuro Code Status: Full  Pt Overview and Major Events to Date:  9/19: MRI/MRA left MCA branch stroke  Assessment and Plan: ERCELL PERLMAN is a 79 y.o. female presenting with expressive aphasia and left sided weakness that started this afternoon. PMH is significant for HTN, dementia, gastric ca, PUD, and stroke in 2003 (takes ASA 81). Already appears to be improving.   #Neuro:  Stroke vs TIA: Pt. Is with clinical symptoms of acute stroke. Code stroke was initiated in the ED and Neurology evaluated the patient. Aphasia has slightly improved since onset today at 1 pm. Admitted to Neuro floor on telemetry.  - Neuro: presenting with probable left MCA subcortical TIA. Acute small vessel subcortical stroke cannot be ruled out, however.  - Neuro checks Q2, TIA/stroke admission order set followed - PT consult, OT consult, Speech consult  - EKG:SR, RBBB; repeat in the am  - Start Plavix (was on aspirin) - Start atorvastatin after NPO status cleared.  - Cmet: Cr 1.60 (appears near baseline) - CBC: WBC 7.2>10.4, Hgb stable - Risk strat: Lipids LDL 111 Chol 215; A1c pending - MRI/MRA shows small left MCA acute infarct - Carotid doppler: 1-39% stenosis bilaterally  Dementia: Patient has local neurologist for h/o of TIA and Dementia. TIA occurred in 2003, pt was on daily ASA (81).  - Patient is mildly demented. She is taking Namenda and Aricept daily. Currently those meds are being held until swallow evaluation is completed. Will restart when capable.   #GU:  UTI: urine was obtained in ED, does not appear to be a clean catch or cath with many squamous cells, large leuks and negative nitrites. Ceftriaxone IV was administered in the ED.  - Continued  ceftriaxone x3d   #CV:  H/O HTN: Patient currently only prescribed Lasix for what her son states was for leg edema. She has been prescribed no cardiac medications. Patient with elevated BP 163/88. VSS.  - allow for permissive HTN currently with parameters of >200/100 to call FPTS and administer low dose hydralazine.   FEN/GI: NPO, until swallow study  Prophylaxis: Hep SQ  Disposition: pending clinical course  Subjective:  Patient unable to speak. Shakes head to say she has no pain, feels cold. Son at bedside said she has not been able to speak. He also says she has some weakness on the right side before this episode.  Objective: Temp:  [96.9 F (36.1 C)-98.3 F (36.8 C)] 97.6 F (36.4 C) (09/19 0600) Pulse Rate:  [55-83] 76 (09/19 0600) Resp:  [13-20] 18 (09/19 0600) BP: (152-191)/(52-88) 152/67 mmHg (09/19 0600) SpO2:  [95 %-100 %] 96 % (09/19 0600) Weight:  [102 lb (46.267 kg)] 102 lb (46.267 kg) (09/18 2000) Physical Exam: General: NAD, sitting in chair. Unable to speak at all HEENT: PERRL, EOMI Cardiovascular: RRR, normal heart sounds Respiratory: CTAB Abdomen: Soft, nontender, bowel sounds present Extremities: no edema, 2nd toe overlapping great toe on both feet. Neuro: CN: right facial droop and asymmetric smile, sensation unable to examine effectively due to communication, otherwise CN intact.  Motor: Pronator drift present on right. RUE biceps 4+/5, triceps 4/5, RLE 4+/5. Babinski limited to toe contortion, withdraws whole foot.  Laboratory:  Recent Labs Lab 08/29/13 1357 08/29/13 1412 08/29/13 2054 08/30/13 0240  WBC 6.7  --  7.2  10.4  HGB 11.5* 12.6 10.8* 11.3*  HCT 36.3 37.0 32.6* 34.1*  PLT 243  --  202 215    Recent Labs Lab 08/29/13 1357 08/29/13 1412 08/29/13 2054 08/30/13 0240  NA 137 141  --  141  K 3.8 3.9  --  3.7  CL 100 107  --  107  CO2 24  --   --  20  BUN 41* 40*  --  37*  CREATININE 1.73* 1.80* 1.61* 1.60*  CALCIUM 9.1  --   --   8.3*  PROT 6.9  --   --  6.2  BILITOT 0.3  --   --  0.2*  ALKPHOS 115  --   --  102  ALT 12  --   --  11  AST 22  --   --  18  GLUCOSE 109* 95  --  104*   Lipid Panel     Component Value Date/Time   CHOL 215* 08/30/2013 0240   TRIG 85 08/30/2013 0240   HDL 87 08/30/2013 0240   CHOLHDL 2.5 08/30/2013 0240   VLDL 17 08/30/2013 0240   LDLCALC 111* 08/30/2013 0240    Imaging/Diagnostic Tests: CT Head wo contrast: IMPRESSION:  Atrophy with fairly extensive small vessel disease. No demonstrable  mass, hemorrhage, or acute appearing infarct.  2v CXR: IMPRESSION:  1. No evidence of acute cardiopulmonary disease.  2. Chronic cardiomegaly without failure.  MRI/MRA head IMPRESSION:  MRI HEAD IMPRESSION  1. Small/patchy left MCA acute infarct. No mass effect or  hemorrhage.  2. Otherwise stable MRI appearance of the brain since 2009.  3. Increased chronic left mastoid effusion. Mild paranasal sinus  inflammation.  MRA HEAD IMPRESSION  No major left MCA branch occlusion or stenosis. Negative for age  intracranial MRA.   Tawni Carnes, MD 08/30/2013, 7:16 AM PGY-1, [facility] FPTS Intern pager: [phone], text pages welcome

## 2013-08-30 NOTE — Progress Notes (Signed)
INITIAL NUTRITION ASSESSMENT  DOCUMENTATION CODES Per approved criteria  -Underweight   INTERVENTION: Advance diet as medically appropriate  RD to follow for nutrition care plan, add interventions accordingly   NUTRITION DIAGNOSIS: Inadequate oral intake related to inability to eat as evidenced by NPO status  Goal: Pt to meet >/= 90% of their estimated nutrition needs   Monitor:  PO diet advancement & intake, weight, labs, I/O's  Reason for Assessment: Malnutrition Screening Tool Report  79 y.o. female  Admitting Dx: acute aphasia, AMS, unilateral weakness  ASSESSMENT: Patient with PMH significant for HTN, dementia, gastric cancer, PUD, and stroke in 2003; presented with expressive aphasia and left sided weakness; probable left MCA subcortical TIA.  RD spoke with patient's family who report patient eats well at home; does eat 3 meals per day; weight has been stable; they state she's always been "small framed;" RD spoke with Marchelle Folks, SLP regarding swallow evaluation at bedside ---> recommending NPO status at this time.  Height: Ht Readings from Last 1 Encounters:  08/29/13 5\' 5"  (1.651 m)    Weight: Wt Readings from Last 1 Encounters:  08/29/13 102 lb (46.267 kg)    Ideal Body Weight: 125 lb  % Ideal Body Weight: 82%  Wt Readings from Last 10 Encounters:  08/29/13 102 lb (46.267 kg)  06/27/13 97 lb (43.999 kg)  06/04/13 97 lb 4.8 oz (44.135 kg)  05/18/13 94 lb (42.638 kg)  03/25/13 97 lb (43.999 kg)  08/22/12 100 lb (45.36 kg)  04/01/12 113 lb (51.256 kg)  04/01/12 113 lb (51.256 kg)  04/01/12 113 lb (51.256 kg)  03/25/12 105 lb (47.628 kg)    Usual Body Weight: 97 lb  % Usual Body Weight: 105%  BMI:  Body mass index is 16.97 kg/(m^2).  Estimated Nutritional Needs: Kcal: 1200-1400 Protein: 50-60 gm Fluid: >/= 1.5 L  Skin: Intact  Diet Order: NPO  EDUCATION NEEDS: -No education needs identified at this time   Intake/Output Summary (Last 24  hours) at 08/30/13 1458 Last data filed at 08/30/13 1424  Gross per 24 hour  Intake 1989.6 ml  Output      1 ml  Net 1988.6 ml    Labs:   Recent Labs Lab 08/29/13 1357 08/29/13 1412 08/29/13 2054 08/30/13 0240  NA 137 141  --  141  K 3.8 3.9  --  3.7  CL 100 107  --  107  CO2 24  --   --  20  BUN 41* 40*  --  37*  CREATININE 1.73* 1.80* 1.61* 1.60*  CALCIUM 9.1  --   --  8.3*  GLUCOSE 109* 95  --  104*    CBG (last 3)   Recent Labs  08/30/13 0112 08/30/13 0619 08/30/13 1138  GLUCAP 92 108* 100*    Scheduled Meds: . atorvastatin  40 mg Oral q1800  . cefTRIAXone (ROCEPHIN)  IV  1 g Intravenous Q24H  . clopidogrel  75 mg Oral Q breakfast  . heparin  5,000 Units Subcutaneous Q8H    Continuous Infusions: . sodium chloride 50 mL/hr at 08/30/13 1423    Past Medical History  Diagnosis Date  . Dementia   . Hypertension   . Ulcer     Hx of  . Stomach cancer   . Snoring   . Fracture, intertrochanteric, left femur 03/25/2012  . Fracture, intertrochanteric, right femur 06/2008    s/p closed reduction and internal fixation  . Nodule of left lung     followed by  Dr. Vassie Loll // LLL spiculated nodule noted per CT in 2012 1.5x1.6x2.4, PET in 06/03/2011 was low uptake.    Past Surgical History  Procedure Laterality Date  . Stomach surgery      3/4 of stomach removed due to stomach cancer  . Hip surgery  2009    right  . Total hip arthroplasty  03/29/2012    Procedure: TOTAL HIP ARTHROPLASTY;  Surgeon: Eulas Post, MD;  Location: [facility] OR;  Service: Orthopedics;  Laterality: Left;  Left Hip Hemiarthroplasty    Maureen Chatters, RD, LDN Pager #: [phone] After-Hours Pager #: [phone]

## 2013-08-30 NOTE — Progress Notes (Signed)
OT Cancellation Note  Patient Details Name: [name] MRN: [id number] DOB: [birth year]   Cancelled Treatment:    Reason Eval/Treat Not Completed: Patient at procedure or test/ unavailable (MRI)  Harolyn Rutherford Pager: [phone]  08/30/2013, 10:02 AM

## 2013-08-30 NOTE — Progress Notes (Signed)
FMTS Attending Note  I personally saw and evaluated the patient. The plan of care was discussed with the resident team. I agree with the assessment and plan as documented by the resident.   MRI brain consistent with left MCA infarct. Patient continues to have aphasia/dysarthria with minimal motor dysfunction.  1. CVA - continue workup with carotid dopplers/echo. Appreciate Neurology input. On ASA 81mg  and Plavix. 2. Hx. Of lung nodule - currently managed by pulmonologist Dr. Vassie Loll, patient was supposed to have upcoming PET scan per patient's son, will need follow up of this issue as outpatient, if malignancy may be contributing to a hypercoagulable state 3. Baseline Dementia - restart Namenda/Aricept once swallow study completed.   Disposition - further discussion needs to be had with family/patient on need for further workup of lung nodule as outpatient, patient will likely benefit from SNF/rehab stay at time of discharge. Awaiting PT/OT recommendations.   Donnella Sham MD

## 2013-08-30 NOTE — Progress Notes (Signed)
Pt heart rate sustaining on 140-160s. MD on-call aware and ordered for EKG. Around 1950, MD at bedside. Vital signs checked. MD ordered for pt to be transferred to stepdown. Report given to RN. Around 2140, transferred pt to stepdown via bed.

## 2013-08-30 NOTE — Progress Notes (Signed)
Stroke Team Progress Note  HISTORY  [name] is an 79 y.o. female with a history of hypertension, dementia, stomach cancer and peptic ulcer disease, presenting with new onset of inability to speak. Patient reportedly was inattentive to those around her and attempted to speak but could not utter any sounds and was aware that she could not speak. She was last seen well at noon today. His previous history of stroke in 2003. She's been on aspirin 81 mg per day. CT scan of her head showed no acute intracranial abnormality. Her ability to speak improved markedly while being evaluated in the emergency room. She had mild residual dysarthria. She also did not remember her correct age. NIH stroke score was 2. Patient had foul-smelling urine and was hypothermic with a rectal temperature of 96.9. She's being admitted for TIA workup as well as for possible urosepsis.   LSN: Noon on 08/29/2013  tPA Given: No: Rapidly resolving deficits.  MRankin: 2   She was admitted for further evaluation and treatment.  SUBJECTIVE Her family is at the bedside.  Overall she feels her condition is unchanged.   OBJECTIVE Most recent Vital Signs: Filed Vitals:   08/30/13 0600 08/30/13 0800 08/30/13 1020 08/30/13 1428  BP: 152/67 147/67 156/53 160/51  Pulse: 76 82 72 76  Temp: 97.6 F (36.4 C) 97.9 F (36.6 C) 97.6 F (36.4 C) 98 F (36.7 C)  TempSrc: Oral Oral Oral Oral  Resp: 18 20 20 20   Height:      Weight:      SpO2: 96% 96% 98% 98%   CBG (last 3)   Recent Labs  08/30/13 0112 08/30/13 0619 08/30/13 1138  GLUCAP 92 108* 100*    IV Fluid Intake:   . sodium chloride 50 mL/hr at 08/30/13 1423    MEDICATIONS  . atorvastatin  40 mg Oral q1800  . cefTRIAXone (ROCEPHIN)  IV  1 g Intravenous Q24H  . clopidogrel  75 mg Oral Q breakfast  . heparin  5,000 Units Subcutaneous Q8H   PRN:  acetaminophen, acetaminophen, hydrALAZINE, senna-docusate  Diet:  NPO Activity:  Bedrest DVT Prophylaxis:  Heparin  SQ  CLINICALLY SIGNIFICANT STUDIES Basic Metabolic Panel:  Recent Labs Lab 08/29/13 1357 08/29/13 1412 08/29/13 2054 08/30/13 0240  NA 137 141  --  141  K 3.8 3.9  --  3.7  CL 100 107  --  107  CO2 24  --   --  20  GLUCOSE 109* 95  --  104*  BUN 41* 40*  --  37*  CREATININE 1.73* 1.80* 1.61* 1.60*  CALCIUM 9.1  --   --  8.3*   Liver Function Tests:  Recent Labs Lab 08/29/13 1357 08/30/13 0240  AST 22 18  ALT 12 11  ALKPHOS 115 102  BILITOT 0.3 0.2*  PROT 6.9 6.2  ALBUMIN 3.1* 3.0*   CBC:  Recent Labs Lab 08/29/13 1357  08/29/13 2054 08/30/13 0240  WBC 6.7  --  7.2 10.4  NEUTROABS 4.4  --   --  8.8*  HGB 11.5*  < > 10.8* 11.3*  HCT 36.3  < > 32.6* 34.1*  MCV 96.0  --  94.8 94.7  PLT 243  --  202 215  < > = values in this interval not displayed. Coagulation:  Recent Labs Lab 08/29/13 1357 08/30/13 0240  LABPROT 12.0 12.9  INR 0.90 0.99   Cardiac Enzymes:  Recent Labs Lab 08/29/13 1357 08/29/13 2039 08/30/13 0240  TROPONINI <0.30 <0.30 <  0.30   Urinalysis:  Recent Labs Lab 08/29/13 1439  COLORURINE YELLOW  LABSPEC 1.010  PHURINE 8.0  GLUCOSEU NEGATIVE  HGBUR MODERATE*  BILIRUBINUR NEGATIVE  KETONESUR NEGATIVE  PROTEINUR 100*  UROBILINOGEN 0.2  NITRITE NEGATIVE  LEUKOCYTESUR LARGE*   Lipid Panel    Component Value Date/Time   CHOL 215* 08/30/2013 0240   TRIG 85 08/30/2013 0240   HDL 87 08/30/2013 0240   CHOLHDL 2.5 08/30/2013 0240   VLDL 17 08/30/2013 0240   LDLCALC 111* 08/30/2013 0240   HgbA1C  Lab Results  Component Value Date   HGBA1C 5.9* 08/30/2013    Urine Drug Screen:     Component Value Date/Time   LABOPIA NONE DETECTED 08/29/2013 1439   COCAINSCRNUR NONE DETECTED 08/29/2013 1439   LABBENZ NONE DETECTED 08/29/2013 1439   AMPHETMU NONE DETECTED 08/29/2013 1439   THCU NONE DETECTED 08/29/2013 1439   LABBARB NONE DETECTED 08/29/2013 1439    Alcohol Level:  Recent Labs Lab 08/29/13 1357  ETH <11    Dg Chest 2  View 08/30/2013   1. No evidence of acute cardiopulmonary disease. 2. Chronic cardiomegaly without failure.     Ct Head Wo Contrast 08/29/2013    Atrophy with fairly extensive small vessel disease. No demonstrable mass, hemorrhage, or acute appearing infarct.   Mr Brain Wo Contrast 08/30/2013     1. Small/patchy left MCA acute infarct. No mass effect or hemorrhage.  2. Otherwise stable MRI appearance of the brain since 2009.  3. Increased chronic left mastoid effusion. Mild paranasal sinus inflammation.   Mr Maxine Glenn Head/brain Wo Cm 08/30/2013    No major left MCA branch occlusion or stenosis. Negative for age intracranial MRA.    2D Echocardiogram  EF 70%, wall motion normal, LA size normal  Carotid Doppler  Findings consistent with 1-39 percent stenosis involving the right internal carotid artery and the left internal carotid artery  EKG  normal sinus rhythm.   Therapy Recommendations home health  Physical Exam   Frail elderly caucasian lady sitting in bedside chair.Awake alert. Afebrile. Head is nontraumatic. Neck is supple without bruit. Hearing is normal. Cardiac exam no murmur or gallop. Lungs are clear to auscultation. Distal pulses are well felt. Neurological Exam ; awake alert globally aphasic. Speaks short sentences and few words. Unable to follow even simple commands. Follows occasional midline gestures. She is able to visually track in either directions. She blinks to threat bilaterally. No facial weakness. Tongue is midline. Motor system exam reveals no upper or lower extremity drift. Symmetric and equal strength in all 4 extremities though her cooperation is limited. No focal weakness. Sensation and coordination cannot be reliably tested. Gait was not tested. ASSESSMENT [name] is a 79 y.o. female presenting with global aphasia. Imaging confirms a left MCA infarct. Infarct felt to be secondary to small vessel disease.  On no antithrombotics prior to admission. Now on  clopidogrel 75 mg orally every day and rectal aspirin 300mg  daily for secondary stroke prevention. Patient with resultant aphasia. Work up underway.   Dementia  Hypertension  Stomach cancer  Peptic ulcer disease  Hyperlipidemia, LDL 111, goal < 100 in non diabetics, < 70 in diabetic patients  Anemia  Renal insufficiency  Abnormal urine, on antibiotics  Long term medication use  Hospital day # 1  TREATMENT/PLAN  Continue aspirin 325 mg orally every day and clopidogrel 75 mg orally every day for secondary stroke prevention.   Statin therapy  Therapy evaluations otherwise HOME HEALTH.  Failed speech, remains NPO, may need panda for nutrition until can pass swallow if prolonged time; however could ultimately need PEG.  FULL CODE.   Follow up with Dr. Pearlean Brownie in 2 months if discharged home over weekend.  Gwendolyn Lima. Manson Passey, [facility], MBA, MHA [facility] Pager: [phone] 08/30/2013 4:45 PM  I have personally obtained a history, examined the patient, evaluated imaging results, and formulated the assessment and plan of care. I agree with the above. Delia Heady, MD

## 2013-08-30 NOTE — Progress Notes (Signed)
VASCULAR LAB PRELIMINARY  PRELIMINARY  PRELIMINARY  PRELIMINARY  Carotid duplex  completed.    Preliminary report:  Bilateral:  1-39% ICA stenosis.  Vertebral artery flow is antegrade.      Dayvon Dax, RVT 08/30/2013, 9:44 AM

## 2013-08-30 NOTE — Evaluation (Signed)
Clinical/Bedside Swallow Evaluation Patient Details  Name: [name] MRN: [id number] Date of Birth: 28-Aug-1926  Today's Date: 08/30/2013 Time: 1355-1420 SLP Time Calculation (min): 25 min  Past Medical History:  Past Medical History  Diagnosis Date  . Dementia   . Hypertension   . Ulcer     Hx of  . Stomach cancer   . Snoring   . Fracture, intertrochanteric, left femur 03/25/2012  . Fracture, intertrochanteric, right femur 06/2008    s/p closed reduction and internal fixation  . Nodule of left lung     followed by Dr. Vassie Loll // LLL spiculated nodule noted per CT in 2012 1.5x1.6x2.4, PET in 06/03/2011 was low uptake.   Past Surgical History:  Past Surgical History  Procedure Laterality Date  . Stomach surgery      3/4 of stomach removed due to stomach cancer  . Hip surgery  2009    right  . Total hip arthroplasty  03/29/2012    Procedure: TOTAL HIP ARTHROPLASTY;  Surgeon: Eulas Post, MD;  Location: [facility] OR;  Service: Orthopedics;  Laterality: Left;  Left Hip Hemiarthroplasty   HPI:  [name] is a 79 y.o. female presenting with expressive aphasia and left sided weakness.  PMH is significant for HTN, dementia, gastric ca, PUD, and stroke in 2003  MRI Small/patchy left MCA acute infarct, affecting insula and central white matter.  CT, extensive white matter dz. Per son, pt's dementia is mild.  Conversant PTA.     Assessment / Plan / Recommendation Clinical Impression  Pt presents with signicant dysphagia marked by deficits in oral manipulation, with saliva and POs spilling bilaterally; severely delayed initiation of swallow response, followed by immediate, explosive coughing with thin liquids.  Purees are also not managed well, eliciting signs of potential aspiration.  Son present; discussed impact of stroke on swallow function and recommendation for NPO.  SLP will f/u next dates for improvements and readiness for POs vs need for instrumental study.  Speech/communication eval  also to follow.  Of note, pt followed commands readily.  Speech was characterized by poor onset, requiring cues to initiate and sustain verbalizations; harsh phonation; dysarthric quality.      Aspiration Risk  Severe    Diet Recommendation NPO        Other  Recommendations Recommended Consults:  (tba)   Follow Up Recommendations   (tba)    Frequency and Duration min 3x week  1 week       SLP Swallow Goals Patient will utilize recommended strategies during swallow to increase swallowing safety with: Modified independent assistance   Swallow Study Prior Functional Status  Type of Home: House Available Help at Discharge: Family;Available 24 hours/day    General Date of Onset: 08/29/13 HPI: [name] is a 79 y.o. female presenting with expressive aphasia and left sided weakness.  PMH is significant for HTN, dementia, gastric ca, PUD, and stroke in 2003  MRI Small/patchy left MCA acute infarct, affecting insula and central white matter.  CT, extensive white matter dz. Per son, pt's dementia is mild.  Conversant PTA.   Type of Study: Bedside swallow evaluation Previous Swallow Assessment: none per records Diet Prior to this Study: NPO Temperature Spikes Noted: No Respiratory Status: Room air History of Recent Intubation: No Behavior/Cognition: Alert Oral Cavity - Dentition: Adequate natural dentition Self-Feeding Abilities: Able to feed self Patient Positioning: Upright in chair Baseline Vocal Quality: Hoarse Volitional Cough: Strong Volitional Swallow: Able to elicit    Oral/Motor/Sensory  Function Overall Oral Motor/Sensory Function: Appears within functional limits for tasks assessed   Ice Chips Ice chips: Impaired Presentation: Self Fed Oral Phase Impairments: Reduced labial seal;Impaired mastication Oral Phase Functional Implications: Right anterior spillage;Left anterior spillage;Prolonged oral transit;Oral residue Pharyngeal Phase Impairments: Suspected delayed  Swallow;Wet Vocal Quality;Throat Clearing - Delayed;Cough - Immediate   Thin Liquid Thin Liquid: Impaired Presentation: Cup;Self Fed Oral Phase Impairments: Reduced labial seal Oral Phase Functional Implications: Right anterior spillage;Left anterior spillage;Prolonged oral transit Pharyngeal  Phase Impairments: Suspected delayed Swallow;Multiple swallows;Wet Vocal Quality;Cough - Immediate    Nectar Thick Nectar Thick Liquid: Not tested   Honey Thick Honey Thick Liquid: Not tested   Puree Puree: Impaired Presentation: Spoon Oral Phase Impairments: Reduced labial seal Oral Phase Functional Implications: Right anterior spillage;Left anterior spillage;Oral residue Pharyngeal Phase Impairments: Suspected delayed Swallow;Multiple swallows;Cough - Delayed   Solid   GO    Solid: Not tested      [name], [place] CCC/SLP Pager [phone]  Blenda Mounts Laurice 08/30/2013,4:32 PM

## 2013-08-31 ENCOUNTER — Inpatient Hospital Stay (HOSPITAL_COMMUNITY): Payer: Medicare Other

## 2013-08-31 DIAGNOSIS — R911 Solitary pulmonary nodule: Secondary | ICD-10-CM

## 2013-08-31 DIAGNOSIS — I4891 Unspecified atrial fibrillation: Secondary | ICD-10-CM

## 2013-08-31 DIAGNOSIS — I6350 Cerebral infarction due to unspecified occlusion or stenosis of unspecified cerebral artery: Secondary | ICD-10-CM

## 2013-08-31 LAB — TROPONIN I
Troponin I: <0.3 ng/mL (ref <0.30)
Troponin I: 0.31 ng/mL (ref <0.30)
Troponin I: <0.3 ng/mL (ref <0.30)

## 2013-08-31 LAB — GLUCOSE, CAPILLARY
Glucose-Capillary: 122 mg/dL — ABNORMAL HIGH (ref 70–99)
Glucose-Capillary: 104 mg/dL — ABNORMAL HIGH (ref 70–99)
Glucose-Capillary: 91 mg/dL (ref 70–99)

## 2013-08-31 NOTE — Progress Notes (Signed)
Speech Language Pathology Dysphagia Treatment Patient Details Name: [name] MRN: [id number] DOB: Jun 25, 1926 Today's Date: 08/31/2013 Time: 1100-1130 SLP Time Calculation (min): 30 min  Assessment / Plan / Recommendation Clinical Impression  Pt again exhibiting evidence of a severe oral and oropharyngeal dysphagia with intermittently present swallow response to liquids or purees. With automatic self feeding (small trials of puree or thin) pt triggers a swallow in 50 % of attempts. Wet vocal quality, absence of gag reflex, delayed congested cough and limited ability to initiate movements for swallowing and speech tasks indicate poor prognosis for safe PO intake in the short term. Suggest short term alternate nutrition for now with objective test recommended on Monday, suggest FEES to visualize secretions and mobility of vocal cords. Recommendations at that time can help determine prognosis for PO intake into the long term.     Diet Recommendation  Continue with Current Diet: NPO    SLP Plan FEES   Pertinent Vitals/Pain NA   Swallowing Goals     General Temperature Spikes Noted: No Respiratory Status: Room air Behavior/Cognition: Alert;Cooperative;Pleasant mood Oral Cavity - Dentition: Adequate natural dentition Patient Positioning: Upright in bed  Oral Cavity - Oral Hygiene     Dysphagia Treatment Treatment focused on: Upgraded PO texture trials Treatment Methods/Modalities: Skilled observation;Differential diagnosis Patient observed directly with PO's: Yes Type of PO's observed: Dysphagia 1 (puree);Ice chips;Thin liquids Feeding: Able to feed self Liquids provided via: Cup;Teaspoon Oral Phase Signs & Symptoms: Prolonged bolus formation;Anterior loss/spillage;Prolonged oral phase Pharyngeal Phase Signs & Symptoms: Suspected delayed swallow initiation;Wet vocal quality;Immediate throat clear;Immediate cough Type of cueing: Verbal;Tactile;Visual Amount of cueing: Maximal    GO     Latitia Housewright, Riley Nearing 08/31/2013, 1:07 PM

## 2013-08-31 NOTE — Progress Notes (Signed)
Family Medicine Teaching Service Daily Progress Note Service Pager: [phone]  Patient name: [name] Medical record number: [id number] Date of birth: 31-Aug-1926 Age: 79 y.o. Gender: female  Primary Care Provider: Elvina Sidle, MD Consultants: Neuro Code Status: Full  Pt Overview and Major Events to Date:  9/19:  - MRI/MRA left MCA branch stroke - Afib with RVR, transfer to stepdown and start dilt drip 9/20: Dilt drip held given normalized rate this AM  Assessment and Plan:  [name] is a 79 y.o. female presenting with expressive aphasia and left sided weakness that started this afternoon. PMH is significant for HTN, dementia, gastric ca, PUD, and stroke in 2003 (takes ASA 81).   #Neuro:  Stroke vs TIA: Pt. Is with clinical symptoms of acute stroke. Code stroke in ED and Neurology evaluated the patient. Aphasia not much improved. Admitted to Neuro floor on telemetry. MRI/MRA shows small left MCA acute infarct; Carotid doppler: 1-39% stenosis bilaterally - Neuro: presenting with probable left MCA subcortical TIA. Acute small vessel subcortical stroke cannot be ruled out, however.  - Neuro checks Q2, TIA/stroke admission order set followed - PT consult, OT consult, Speech consult - Rec home health and NPO - EKG:SR, RBBB; repeat with NSR; troponin neg x 3 - Discuss recs with neuro re: TEE, anticoagulation (holding plavix and atorvastatin while NPO), home health vs SNF.  - Risk strat: Lipids LDL 111 Chol 215; A1c 5.9 - Ordered nasotracheal suction   #Dementia: Patient has local neurologist for h/o of TIA and Dementia. TIA occurred in 2003, pt was on daily ASA (81).  - Patient is mildly demented. She is taking Namenda and Aricept daily. Currently those meds are being held while NPO per speech. Will restart when capable.   #GU:  UTI: urine was obtained in ED, does not appear to be a clean catch or cath with many squamous cells, large leuks and negative nitrites. Ceftriaxone IV was  administered in the ED. Afebrile. - Continued ceftriaxone x3d  - Creatinine at baseline yesterday (1.6)  #CV:  Atrial fibrillation with RVR - RVR resolved overnight on diltiazem drip, so stopped by nursing this morning - Convert to IV diltiazem - Monitor  H/O HTN: Patient currently only prescribed Lasix for what her son states was for leg edema. She has been prescribed no cardiac medications. Patient with elevated BP 160/50s. VSS.  - allow for permissive HTN currently with parameters of >200/100 to call FPTS and administer low dose hydralazine.   #Left hip tenderness: No fever, skin breakdown, or recent fall. Recent h/o hip fracture. - Monitor, possibly due to prolonged lying in bed  # Coccygeal ulcer: unstageable - Continue padded dressing - WOC consult ordered   FEN/GI: NPO per speech; consider NG tube feeds after discussion with family Prophylaxis: Hep SQ  Disposition: pending clinical course  Subjective:  Patient unable to speak but shakes head when asked about any pain, chest pain, shortness of breath, or confusion. Son and daughter in room and state besides inability to speak, pt is at baseline mentation.  Objective: Temp:  [97.5 F (36.4 C)-98.7 F (37.1 C)] 97.5 F (36.4 C) (09/20 0800) Pulse Rate:  [72-135] 125 (09/20 0100) Resp:  [18-20] 18 (09/20 0100) BP: (121-160)/(49-91) 121/61 mmHg (09/20 0100) SpO2:  [93 %-99 %] 93 % (09/20 0100) Weight:  [96 lb 5.5 oz (43.7 kg)] 96 lb 5.5 oz (43.7 kg) (09/19 2300) Physical Exam: General: NAD, lying in bed. Unable to speak at all HEENT: EOMI, mouth open Cardiovascular:  RRR, normal heart sounds Respiratory: CTAB, has occasional wet-sounding clearing of throat Abdomen: Soft, nontender, bowel sounds present Extremities: no edema, 2nd toe overlapping great toe on both feet. Left hip tenderness. Neuro: CN: right facial droop and asymmetric smile, sensation unable to examine effectively due to communication; completely unable to  speak Motor: Pronator drift present on right. Withdraws extremities to contact, able to turn over Back: sacral dressing in place, left hip with no skin breakdown or erythema  Laboratory:  Recent Labs Lab 08/29/13 1357 08/29/13 1412 08/29/13 2054 08/30/13 0240  WBC 6.7  --  7.2 10.4  HGB 11.5* 12.6 10.8* 11.3*  HCT 36.3 37.0 32.6* 34.1*  PLT 243  --  202 215    Recent Labs Lab 08/29/13 1357 08/29/13 1412 08/29/13 2054 08/30/13 0240  NA 137 141  --  141  K 3.8 3.9  --  3.7  CL 100 107  --  107  CO2 24  --   --  20  BUN 41* 40*  --  37*  CREATININE 1.73* 1.80* 1.61* 1.60*  CALCIUM 9.1  --   --  8.3*  PROT 6.9  --   --  6.2  BILITOT 0.3  --   --  0.2*  ALKPHOS 115  --   --  102  ALT 12  --   --  11  AST 22  --   --  18  GLUCOSE 109* 95  --  104*   Lipid Panel     Component Value Date/Time   CHOL 215* 08/30/2013 0240   TRIG 85 08/30/2013 0240   HDL 87 08/30/2013 0240   CHOLHDL 2.5 08/30/2013 0240   VLDL 17 08/30/2013 0240   LDLCALC 111* 08/30/2013 0240    Imaging/Diagnostic Tests: CT Head wo contrast: IMPRESSION:  Atrophy with fairly extensive small vessel disease. No demonstrable  mass, hemorrhage, or acute appearing infarct.  2v CXR: IMPRESSION:  1. No evidence of acute cardiopulmonary disease.  2. Chronic cardiomegaly without failure.  MRI/MRA head IMPRESSION:  MRI HEAD IMPRESSION  1. Small/patchy left MCA acute infarct. No mass effect or  hemorrhage.  2. Otherwise stable MRI appearance of the brain since 2009.  3. Increased chronic left mastoid effusion. Mild paranasal sinus  inflammation.  MRA HEAD IMPRESSION  No major left MCA branch occlusion or stenosis. Negative for age  intracranial MRA.   Leona Singleton, MD 08/31/2013, 9:44 AM PGY-2, [facility] FPTS Intern pager: [phone], text pages welcome

## 2013-08-31 NOTE — Evaluation (Signed)
Speech Language Pathology Evaluation Patient Details Name: [name] MRN: [id number] DOB: March 29, 1926 Today's Date: 08/31/2013 Time: 1100-1200 SLP Time Calculation (min): 60 min  Problem List:  Patient Active Problem List   Diagnosis Date Noted  . TIA (transient ischemic attack) 08/29/2013  . Hypertension 08/29/2013  . Fall from standing 03/25/2012  . Fracture, intertrochanteric, left femur 03/25/2012  . Dementia 03/25/2012  . Lung nodule 05/24/2011   Past Medical History:  Past Medical History  Diagnosis Date  . Dementia   . Hypertension   . Ulcer     Hx of  . Stomach cancer   . Snoring   . Fracture, intertrochanteric, left femur 03/25/2012  . Fracture, intertrochanteric, right femur 06/2008    s/p closed reduction and internal fixation  . Nodule of left lung     followed by Dr. Vassie Loll // LLL spiculated nodule noted per CT in 2012 1.5x1.6x2.4, PET in 06/03/2011 was low uptake.   Past Surgical History:  Past Surgical History  Procedure Laterality Date  . Stomach surgery      3/4 of stomach removed due to stomach cancer  . Hip surgery  2009    right  . Total hip arthroplasty  03/29/2012    Procedure: TOTAL HIP ARTHROPLASTY;  Surgeon: Eulas Post, MD;  Location: [facility] OR;  Service: Orthopedics;  Laterality: Left;  Left Hip Hemiarthroplasty   HPI:  [name] is a 79 y.o. female presenting with expressive aphasia and left sided weakness.  PMH is significant for HTN, dementia, gastric ca, PUD, and stroke in 2003  MRI Small/patchy left MCA acute infarct, affecting insula and central white matter.  CT, extensive white matter dz. Per son, pt's dementia is mild.  Conversant PTA.     Assessment / Plan / Recommendation Clinical Impression  Pt demonstrates evidence of a severe motor planning disorder evidenced by inability to coordinate initiation of phonation or movement of articulators in the absence of significant weakness to all articulators. Pt can close lips, open jaw etc,  but cannot coordinate movement for /ma/.  She does exhibit lingual weakness and absent gag reflex, which in addition to poor initaiton and volitional movement, is also impacting swallow function. Pt exhibits adequate language function; she follows complex commands and is able to write at sentence level. Pt has been documented to be globally aphasic, which is incorrect. She does however have come cognitive deficits at baseline which may be exacerbated now. Though she can follow 3 multistep commands, she somtimes becomes confused with more complex reasoning and has decreased memory of new information. SLP will continue to follow to aid in speech production as well as total communication to express baic wants/needs. Suggest CIR. If no physical needs, pt will need intensive outpatient therapy.     SLP Assessment  Patient needs continued Speech Lanaguage Pathology Services    Follow Up Recommendations  Inpatient Rehab    Frequency and Duration min 2x/week  2 weeks   Pertinent Vitals/Pain NA   SLP Goals  SLP Goals Potential to Achieve Goals: Good SLP Goal #1: Pt will verbalize at syllable level (bilabial) with max verbal and visual cues x3.  SLP Goal #1 - Progress: Progressing toward goal SLP Goal #2: Pt will verbalize during automatic speech tasks with max verbal contextual cues x1.  SLP Goal #2 - Progress: Progressing toward goal SLP Goal #3: Pt will indicate basic wants needs using gestures of communciation board with min assist x3.  SLP Goal #3 - Progress: Progressing  toward goal  SLP Evaluation Prior Functioning  Cognitive/Linguistic Baseline: Baseline deficits Baseline deficit details: mild dementia Type of Home: House Available Help at Discharge: Family;Available 24 hours/day   Cognition  Overall Cognitive Status: History of cognitive impairments - at baseline Arousal/Alertness: Awake/alert Orientation Level: Oriented to person;Disoriented to time Attention:  Focused;Sustained;Selective Focused Attention: Appears intact Sustained Attention: Appears intact Selective Attention: Appears intact Memory: Impaired Memory Impairment: Decreased short term memory;Decreased recall of new information Decreased Short Term Memory: Verbal basic;Functional basic Awareness: Impaired Awareness Impairment: Intellectual impairment;Emergent impairment Problem Solving: Impaired Problem Solving Impairment: Verbal complex;Functional complex Executive Function: Reasoning;Sequencing;Self Monitoring    Comprehension  Auditory Comprehension Yes/No Questions: Within Functional Limits Commands: Impaired One Step Basic Commands: 75-100% accurate Two Step Basic Commands: 75-100% accurate Multistep Basic Commands: 75-100% accurate Complex Commands: 50-74% accurate Conversation: Simple Interfering Components: Processing speed;Working Radio broadcast assistant: Clinical cytogeneticist: Within [employer] Reading Comprehension Reading Status: Within funtional limits    Expression Expression Primary Mode of Expression: Nonverbal - gestures Verbal Expression Overall Verbal Expression: Impaired Initiation: Impaired Automatic Speech:  (unable) Repetition: Impaired Level of Impairment: Word level Naming: Impairment Pragmatics: No impairment Non-Verbal Means of Communication: Writing;Drawing;Gestures;Communication board Written Expression Dominant Hand: Right   Oral / Motor Oral Motor/Sensory Function Overall Oral Motor/Sensory Function: Impaired Labial ROM: Within Functional Limits Labial Symmetry: Within Functional Limits Labial Strength: Within Functional Limits Lingual ROM: Reduced right Lingual Symmetry: Abnormal symmetry right Lingual Strength: Reduced Facial ROM: Within Functional Limits Facial Symmetry: Within Functional Limits Facial Strength: Within Functional  Limits Facial Sensation: Within Functional Limits Velum: Within Functional Limits Mandible: Within Functional Limits Motor Speech Overall Motor Speech: Impaired Respiration: Impaired Level of Impairment: Word (stuggle to initiate exhalation and adduction for phonation) Phonation: Low vocal intensity;Breathy (can sustain phonation with max cues) Resonance: Within functional limits Articulation: Impaired Level of Impairment: Word Intelligibility: Unable to assess (comment) Motor Planning: Impaired Level of Impairment: Word Motor Speech Errors: Groping for words   GO    Harlon Ditty, MA CCC-SLP [phone]  Claudine Mouton 08/31/2013, 1:33 PM

## 2013-08-31 NOTE — Progress Notes (Signed)
Troponin 0.31.  Paged Int. Med teaching awaiting call back.

## 2013-08-31 NOTE — Progress Notes (Addendum)
Stroke Team Progress Note  HISTORY  [name] is an 79 y.o. female with a history of hypertension, dementia, stomach cancer and peptic ulcer disease, presenting with new onset of inability to speak. Patient reportedly was inattentive to those around her and attempted to speak but could not utter any sounds and was aware that she could not speak. She was last seen well at noon today. His previous history of stroke in 2003. She's been on aspirin 81 mg per day. CT scan of her head showed no acute intracranial abnormality. Her ability to speak improved markedly while being evaluated in the emergency room. She had mild residual dysarthria. She also did not remember her correct age. NIH stroke score was 2. Patient had foul-smelling urine and was hypothermic with a rectal temperature of 96.9. She's being admitted for TIA workup as well as for possible urosepsis.   LSN: Noon on 08/29/2013  tPA Given: No: Rapidly resolving deficits.  MRankin: 2   She was admitted for further evaluation and treatment.  SUBJECTIVE Her family is at the bedside.  Overall she feels her condition is unchanged. Yesterday found to have A fib with RVR  OBJECTIVE Most recent Vital Signs: Filed Vitals:   08/31/13 0100 08/31/13 0300 08/31/13 0400 08/31/13 0800  BP: 121/61     Pulse: 125     Temp:  98.7 F (37.1 C) 98.7 F (37.1 C) 97.5 F (36.4 C)  TempSrc:  Oral Oral Oral  Resp: 18     Height:      Weight:      SpO2: 93%      CBG (last 3)   Recent Labs  08/30/13 2118 08/30/13 2351 08/31/13 0542  GLUCAP 124* 121* 122*    IV Fluid Intake:   . sodium chloride 50 mL/hr at 08/31/13 0600  . diltiazem (CARDIZEM) infusion 5 mg/hr (08/31/13 0700)    MEDICATIONS  . aspirin  300 mg Rectal Daily  . atorvastatin  40 mg Oral q1800  . cefTRIAXone (ROCEPHIN)  IV  1 g Intravenous Q24H  . clopidogrel  75 mg Oral Q breakfast  . heparin  5,000 Units Subcutaneous Q8H   PRN:  acetaminophen, acetaminophen, hydrALAZINE,  senna-docusate  Diet:  NPO Activity:  Bedrest DVT Prophylaxis:  Heparin SQ  CLINICALLY SIGNIFICANT STUDIES Basic Metabolic Panel:   Recent Labs Lab 08/29/13 1357 08/29/13 1412 08/29/13 2054 08/30/13 0240  NA 137 141  --  141  K 3.8 3.9  --  3.7  CL 100 107  --  107  CO2 24  --   --  20  GLUCOSE 109* 95  --  104*  BUN 41* 40*  --  37*  CREATININE 1.73* 1.80* 1.61* 1.60*  CALCIUM 9.1  --   --  8.3*   Liver Function Tests:   Recent Labs Lab 08/29/13 1357 08/30/13 0240  AST 22 18  ALT 12 11  ALKPHOS 115 102  BILITOT 0.3 0.2*  PROT 6.9 6.2  ALBUMIN 3.1* 3.0*   CBC:  Recent Labs Lab 08/29/13 1357  08/29/13 2054 08/30/13 0240  WBC 6.7  --  7.2 10.4  NEUTROABS 4.4  --   --  8.8*  HGB 11.5*  < > 10.8* 11.3*  HCT 36.3  < > 32.6* 34.1*  MCV 96.0  --  94.8 94.7  PLT 243  --  202 215  < > = values in this interval not displayed. Coagulation:   Recent Labs Lab 08/29/13 1357 08/30/13 0240  LABPROT  12.0 12.9  INR 0.90 0.99   Cardiac Enzymes:   Recent Labs Lab 08/30/13 0240 08/30/13 2026 08/31/13 0140  TROPONINI <0.30 <0.30 <0.30   Urinalysis:   Recent Labs Lab 08/29/13 1439  COLORURINE YELLOW  LABSPEC 1.010  PHURINE 8.0  GLUCOSEU NEGATIVE  HGBUR MODERATE*  BILIRUBINUR NEGATIVE  KETONESUR NEGATIVE  PROTEINUR 100*  UROBILINOGEN 0.2  NITRITE NEGATIVE  LEUKOCYTESUR LARGE*   Lipid Panel    Component Value Date/Time   CHOL 215* 08/30/2013 0240   TRIG 85 08/30/2013 0240   HDL 87 08/30/2013 0240   CHOLHDL 2.5 08/30/2013 0240   VLDL 17 08/30/2013 0240   LDLCALC 111* 08/30/2013 0240   HgbA1C  Lab Results  Component Value Date   HGBA1C 5.9* 08/30/2013    Urine Drug Screen:     Component Value Date/Time   LABOPIA NONE DETECTED 08/29/2013 1439   COCAINSCRNUR NONE DETECTED 08/29/2013 1439   LABBENZ NONE DETECTED 08/29/2013 1439   AMPHETMU NONE DETECTED 08/29/2013 1439   THCU NONE DETECTED 08/29/2013 1439   LABBARB NONE DETECTED 08/29/2013 1439     Alcohol Level:   Recent Labs Lab 08/29/13 1357  ETH <11    Dg Chest 2 View 08/30/2013   1. No evidence of acute cardiopulmonary disease. 2. Chronic cardiomegaly without failure.     Ct Head Wo Contrast 08/29/2013    Atrophy with fairly extensive small vessel disease. No demonstrable mass, hemorrhage, or acute appearing infarct.   Mr Brain Wo Contrast 08/30/2013     1. Small/patchy left MCA acute infarct. No mass effect or hemorrhage.  2. Otherwise stable MRI appearance of the brain since 2009.  3. Increased chronic left mastoid effusion. Mild paranasal sinus inflammation.   Mr Maxine Glenn Head/brain Wo Cm 08/30/2013    No major left MCA branch occlusion or stenosis. Negative for age intracranial MRA.    2D Echocardiogram  EF 70%, wall motion normal, LA size normal  Carotid Doppler  Findings consistent with 1-39 percent stenosis involving the right internal carotid artery and the left internal carotid artery  EKG  normal sinus rhythm.   Therapy Recommendations home health  Physical Exam   Frail elderly caucasian lady in bed.Awake alert. Afebrile. Head is nontraumatic. Neck is supple without bruit. Hearing is normal. Cardiac exam no murmur or gallop. Lungs are clear to auscultation. Distal pulses are well felt. Neurological Exam ; awake alertc. Non-verbal. Follows simple commands (close eyes, no 2 step commands). Follows occasional midline gestures. She is able to visually track in either directions. She blinks to threat bilaterally. No facial weakness. Tongue is midline. Motor system exam reveals no upper or lower extremity drift. Symmetric and equal strength in all 4 extremities though her cooperation is limited. No focal weakness. Sensation and coordination cannot be reliably tested. Gait was not tested. ASSESSMENT [name] is a 78 y.o. female presenting with global aphasia. Imaging confirms a left MCA infarct. Infarct felt to be secondary to small vessel disease.  On no  antithrombotics prior to admission. Now on Plavix 75mg  daily and ASA 325mg  daily for secondary stroke prevention. Patient with resultant aphasia. Work up underway.   Dementia  Hypertension  Stomach cancer  Peptic ulcer disease  Hyperlipidemia, LDL 111, goal < 100 in non diabetics, < 70 in diabetic patients  Anemia  A fib with RVR  Renal insufficiency  Abnormal urine, on antibiotics  Long term medication use  Hospital day # 2  TREATMENT/PLAN  Continue aspirin 325 mg orally every  day and clopidogrel 75 mg orally every day for secondary stroke prevention.   Due to dementia, fall risk, patient is likely poor candidate for anticoagulation related to A fib.   Statin therapy  Therapy evaluations otherwise HOME HEALTH.  Failed speech, remains NPO, may need panda for nutrition until can pass swallow if prolonged time; however could ultimately need PEG.  FULL CODE.   Follow up with Dr. Pearlean Brownie in 2 months if discharged home over weekend.  Elspeth Cho, DO Neurology-Stroke

## 2013-08-31 NOTE — Progress Notes (Signed)
FMTS Attending Note  I personally saw and evaluated the patient. The plan of care was discussed with the resident team. I agree with the assessment and plan as documented by the resident.   1. CVA - patient currently stable, continues to have significant aphasia/dysarthria, failed swallow study therefore only on ASA currently with plan to add Plavix once tolerating PO 2. New onset Afib - now NSR after diltiazem drip, will start low dose BB if HR is able to tolerate, currently only anticoagulated with ASA, discussed option of anticoagulation with coumadin/Xarelto given new Afib/?lung malignancy/CVA due to thrombus? with family, as patient is 79 years old and has debility/dementia I feel that patient may be a poor candidate for anticoagulation with coumadin/Xarelto, will discuss anticoagulation with Neuro and need for possible TEE to evaluate for mural thrombus 3. Dementia - stable on Namenda/Aricept  Disposition: Patient would likely benefit from SNF stay for rehab. Appreciate SW help with this issue.  Donnella Sham MD

## 2013-09-01 DIAGNOSIS — I639 Cerebral infarction, unspecified: Secondary | ICD-10-CM | POA: Diagnosis present

## 2013-09-01 DIAGNOSIS — I4891 Unspecified atrial fibrillation: Secondary | ICD-10-CM | POA: Diagnosis not present

## 2013-09-01 LAB — GLUCOSE, CAPILLARY
Glucose-Capillary: 96 mg/dL (ref 70–99)
Glucose-Capillary: 85 mg/dL (ref 70–99)

## 2013-09-01 LAB — BASIC METABOLIC PANEL
BUN: 29 mg/dL — ABNORMAL HIGH (ref 6–23)
CO2: 15 mEq/L — ABNORMAL LOW (ref 19–32)
Chloride: 114 mEq/L — ABNORMAL HIGH (ref 96–112)
Creatinine, Ser: 1.74 mg/dL — ABNORMAL HIGH (ref 0.50–1.10)
Glucose, Bld: 87 mg/dL (ref 70–99)
Potassium: 3.7 mEq/L (ref 3.5–5.1)

## 2013-09-01 LAB — TROPONIN I: Troponin I: <0.3 ng/mL (ref <0.30)

## 2013-09-01 MED ORDER — METOPROLOL TARTRATE 1 MG/ML IV SOLN
2.5000 mg | Freq: Three times a day (TID) | INTRAVENOUS | Status: DC
  Administered 2013-09-01: 2.5 mg via INTRAVENOUS
  Filled 2013-09-01 (×4): qty 5

## 2013-09-01 MED ORDER — DEXTROSE-NACL 5-0.45 % IV SOLN
INTRAVENOUS | Status: DC
  Administered 2013-09-04: 21:00:00 via INTRAVENOUS

## 2013-09-01 MED ORDER — DILTIAZEM HCL 100 MG IV SOLR
5.0000 mg/h | INTRAVENOUS | Status: DC
  Administered 2013-09-01: 5 mg/h via INTRAVENOUS
  Filled 2013-09-01: qty 100

## 2013-09-01 MED ORDER — WHITE PETROLATUM GEL
Status: AC
  Administered 2013-09-01: 11:00:00
  Filled 2013-09-01: qty 5

## 2013-09-01 NOTE — Progress Notes (Signed)
Stroke Team Progress Note  HISTORY  [name] is an 79 y.o. female with a history of hypertension, dementia, stomach cancer and peptic ulcer disease, presenting with new onset of inability to speak. Patient reportedly was inattentive to those around her and attempted to speak but could not utter any sounds and was aware that she could not speak. She was last seen well at noon today. His previous history of stroke in 2003. She's been on aspirin 81 mg per day. CT scan of her head showed no acute intracranial abnormality. Her ability to speak improved markedly while being evaluated in the emergency room. She had mild residual dysarthria. She also did not remember her correct age. NIH stroke score was 2. Patient had foul-smelling urine and was hypothermic with a rectal temperature of 96.9. She's being admitted for TIA workup as well as for possible urosepsis.   LSN: Noon on 08/29/2013  tPA Given: No: Rapidly resolving deficits.  MRankin: 2   She was admitted for further evaluation and treatment.  SUBJECTIVE Per RN, went back into A fib with RVR, restarted cardizem gtt. Remains NPO. More alert, responsive this AM, following simple 1 step commands. No family present at bedside  OBJECTIVE Most recent Vital Signs: Filed Vitals:   09/01/13 0848 09/01/13 0900 09/01/13 0901 09/01/13 0902  BP:  146/66    Pulse: 140 131 125 126  Temp:      TempSrc:      Resp:  21 20 20   Height:      Weight:      SpO2:  94% 94% 94%   CBG (last 3)   Recent Labs  08/31/13 1302 08/31/13 1648 09/01/13 0603  GLUCAP 104* 91 96    IV Fluid Intake:   . sodium chloride 50 mL/hr at 08/31/13 0600  . diltiazem (CARDIZEM) infusion      MEDICATIONS  . aspirin  300 mg Rectal Daily  . atorvastatin  40 mg Oral q1800  . clopidogrel  75 mg Oral Q breakfast  . heparin  5,000 Units Subcutaneous Q8H   PRN:  acetaminophen, acetaminophen, hydrALAZINE, senna-docusate  Diet:  NPO Activity:  Bedrest DVT Prophylaxis:   Compression devices  CLINICALLY SIGNIFICANT STUDIES Basic Metabolic Panel:   Recent Labs Lab 08/29/13 1357 08/29/13 1412 08/29/13 2054 08/30/13 0240  NA 137 141  --  141  K 3.8 3.9  --  3.7  CL 100 107  --  107  CO2 24  --   --  20  GLUCOSE 109* 95  --  104*  BUN 41* 40*  --  37*  CREATININE 1.73* 1.80* 1.61* 1.60*  CALCIUM 9.1  --   --  8.3*   Liver Function Tests:   Recent Labs Lab 08/29/13 1357 08/30/13 0240  AST 22 18  ALT 12 11  ALKPHOS 115 102  BILITOT 0.3 0.2*  PROT 6.9 6.2  ALBUMIN 3.1* 3.0*   CBC:  Recent Labs Lab 08/29/13 1357  08/29/13 2054 08/30/13 0240  WBC 6.7  --  7.2 10.4  NEUTROABS 4.4  --   --  8.8*  HGB 11.5*  < > 10.8* 11.3*  HCT 36.3  < > 32.6* 34.1*  MCV 96.0  --  94.8 94.7  PLT 243  --  202 215  < > = values in this interval not displayed. Coagulation:   Recent Labs Lab 08/29/13 1357 08/30/13 0240  LABPROT 12.0 12.9  INR 0.90 0.99   Cardiac Enzymes:   Recent Labs Lab  08/31/13 0140 08/31/13 0900 08/31/13 1725  TROPONINI <0.30 0.31* <0.30   Urinalysis:   Recent Labs Lab 08/29/13 1439  COLORURINE YELLOW  LABSPEC 1.010  PHURINE 8.0  GLUCOSEU NEGATIVE  HGBUR MODERATE*  BILIRUBINUR NEGATIVE  KETONESUR NEGATIVE  PROTEINUR 100*  UROBILINOGEN 0.2  NITRITE NEGATIVE  LEUKOCYTESUR LARGE*   Lipid Panel    Component Value Date/Time   CHOL 215* 08/30/2013 0240   TRIG 85 08/30/2013 0240   HDL 87 08/30/2013 0240   CHOLHDL 2.5 08/30/2013 0240   VLDL 17 08/30/2013 0240   LDLCALC 111* 08/30/2013 0240   HgbA1C  Lab Results  Component Value Date   HGBA1C 5.9* 08/30/2013    Urine Drug Screen:     Component Value Date/Time   LABOPIA NONE DETECTED 08/29/2013 1439   COCAINSCRNUR NONE DETECTED 08/29/2013 1439   LABBENZ NONE DETECTED 08/29/2013 1439   AMPHETMU NONE DETECTED 08/29/2013 1439   THCU NONE DETECTED 08/29/2013 1439   LABBARB NONE DETECTED 08/29/2013 1439    Alcohol Level:   Recent Labs Lab 08/29/13 1357  ETH <11     Dg Chest 2 View 08/30/2013   1. No evidence of acute cardiopulmonary disease. 2. Chronic cardiomegaly without failure.     Ct Head Wo Contrast 08/29/2013    Atrophy with fairly extensive small vessel disease. No demonstrable mass, hemorrhage, or acute appearing infarct.   Mr Brain Wo Contrast 08/30/2013     1. Small/patchy left MCA acute infarct. No mass effect or hemorrhage.  2. Otherwise stable MRI appearance of the brain since 2009.  3. Increased chronic left mastoid effusion. Mild paranasal sinus inflammation.   Mr Maxine Glenn Head/brain Wo Cm 08/30/2013    No major left MCA branch occlusion or stenosis. Negative for age intracranial MRA.    2D Echocardiogram  EF 70%, wall motion normal, LA size normal  Carotid Doppler  Findings consistent with 1-39 percent stenosis involving the right internal carotid artery and the left internal carotid artery  EKG  normal sinus rhythm.   Therapy Recommendations home health  Physical Exam   Frail elderly caucasian lady in bed.Awake alert. Afebrile. Head is nontraumatic. Neck is supple without bruit. Hearing is normal. Cardiac exam no murmur or gallop. Lungs are clear to auscultation. Distal pulses are well felt. Neurological Exam ; awake alertc. Non-verbal. Follows simple commands (close eyes, no 2 step commands). Follows occasional midline gestures. She is able to visually track in either directions. She blinks to threat bilaterally. No facial weakness. Tongue is midline. Motor system exam reveals no upper or lower extremity drift. Symmetric and equal strength in all 4 extremities though her cooperation is limited. No focal weakness. Sensation and coordination cannot be reliably tested. Gait was not tested. ASSESSMENT [name] is a 79 y.o. female presenting with aphasia. Imaging confirms a left MCA infarct. Infarct felt to be secondary to small vessel disease.  On no antithrombotics prior to admission. Now on ASA 300mg  daily for secondary stroke  prevention. Patient with resultant aphasia.    Dementia  Hypertension  Stomach cancer  Peptic ulcer disease  Hyperlipidemia, LDL 111, goal < 100 in non diabetics, < 70 in diabetic patients  Anemia  A fib with RVR  Renal insufficiency  Abnormal urine, on antibiotics  Long term medication use  Hospital day # 3  TREATMENT/PLAN  Continue ASA 300mg  rectally, Plavix held as NPO for secondary stroke prevention.   Due to dementia, fall risk, patient is likely poor candidate for anticoagulation related to  A fib.   Would hold off on TEE at this time, can consider as outpatient  Statin therapy currently held as NPO  Therapy evaluations recommending home health. Will need to discuss discharge planning with family  Failed speech, remains NPO, discuss NG with family if NPO prolonged course; however could ultimately need PEG.  Elspeth Cho, DO Neurology-Stroke

## 2013-09-01 NOTE — Consult Note (Signed)
Cardiology Consult Note  Admit date: 08/29/2013 Name: [name] 79 y.o.  female DOB:  07/10/1926 MRN:  [id number]  Today's date:  09/01/2013  Referring Physician:  Family Practice Service  Primary Physician:   Dr. Elvina Sidle  Reason for Consultation:    Help with management of atrial fibrillation  IMPRESSIONS: 1. Paroxysmal atrial fibrillation that may have contributed to stroke 2. Recent left MCA infarct 3. Underlying dementia 4. Poor functional status 5. History of peptic ulcer disease in past 6. Stage III chronic kidney disease 7. Mild anemia  RECOMMENDATION: 1. Currently she is in sinus bradycardia. I would discontinue her diltiazem drip and continue metoprolol but only give it if the pulse is greater than 60.  2. If she continues to have episodes of paroxysmal atrial fibrillation this may be controlled with low-dose amiodarone.  3. The larger issue is the stroke with a high CHADS2VASC score of 6    She is at high risk of recurrent stroke with paroxysmal atrial fibrillation and the data would indicate that systemic anticoagulation either with a novel oral anticoagulant or warfarin would give her the greatest protection against stroke. This would need to be discussed with her primary team in light of her dementia. She evidently has some previous history of peptic ulcer disease and also is unsteady on her feet.  HISTORY: This 79 year old female has poor functional status and has dementia and is taken care of at home by her son. She evidently walks with a walker or only with assistance. She has a history of hypertension and a prior history of TIA and was admitted with a left middle cerebral artery stroke and aphasia. She developed paroxysmal atrial fibrillation on the 19th and was started on a diltiazem drip and converted to sinus rhythm on the 20th. This morning she went back into atrial fibrillation with rapid response and metoprolol was added to her regimen. Later this  evening she converted back to sinus rhythm but had a posttermination pause and then has had some bradycardia since then and her diltiazem was discontinued. She has no prior cardiac history and denies angina, or chest pain or shortness of breath at the present time. Her son states that she has no prior history of heart trouble. She is not currently having any significant complaints but has difficulty swallowing now.  Past Medical History  Diagnosis Date  . Dementia   . Hypertension   . Ulcer     Hx of  . Stomach cancer   . Snoring   . Fracture, intertrochanteric, left femur 03/25/2012  . Fracture, intertrochanteric, right femur 06/2008    s/p closed reduction and internal fixation  . Nodule of left lung     followed by Dr. Vassie Loll // LLL spiculated nodule noted per CT in 2012 1.5x1.6x2.4, PET in 06/03/2011 was low uptake.      Past Surgical History  Procedure Laterality Date  . Stomach surgery      3/4 of stomach removed due to stomach cancer  . Hip surgery  2009    right  . Total hip arthroplasty  03/29/2012    Procedure: TOTAL HIP ARTHROPLASTY;  Surgeon: Eulas Post, MD;  Location: [facility] OR;  Service: Orthopedics;  Laterality: Left;  Left Hip Hemiarthroplasty     Allergies:  has No Known Allergies.   Medications: Prior to Admission medications   Medication Sig Start Date End Date Taking? Authorizing Provider  calcitRIOL (ROCALTROL) 0.25 MCG capsule Take 0.25 mcg by mouth daily.  Yes Historical Provider, MD  donepezil (ARICEPT) 10 MG tablet Take 10 mg by mouth daily.   Yes Historical Provider, MD  ferrous fumarate (HEMOCYTE - 106 MG FE) 325 (106 FE) MG TABS tablet Take 1 tablet by mouth daily.   Yes Historical Provider, MD  furosemide (LASIX) 20 MG tablet Take 20 mg by mouth every morning.   Yes Historical Provider, MD  memantine (NAMENDA) 10 MG tablet Take 10 mg by mouth 2 (two) times daily.    Yes Historical Provider, MD  Multiple Vitamins-Minerals (EQL GUMMY ADULT PO) Take 1  each by mouth daily.     Yes Historical Provider, MD  sodium bicarbonate 650 MG tablet Take 650 mg by mouth 4 (four) times daily.   Yes Historical Provider, MD  vitamin B-12 (CYANOCOBALAMIN) 1000 MCG tablet Take 1,000 mcg by mouth every Monday, Wednesday, and Friday.    Yes Historical Provider, MD    Family History: No family status information on file.    Social History:   reports that she quit smoking about 39 years ago. Her smoking use included Cigarettes. She has a .6 pack-year smoking history. She has never used smokeless tobacco. She reports that  drinks alcohol.   History   Social History Narrative  . No narrative on file    Review of Systems: Unable to obtain due to dementia and aphasia  Physical Exam: BP 131/49  Pulse 49  Temp(Src) 97.8 F (36.6 C) (Oral)  Resp 13  Ht 5\' 5"  (1.651 m)  Wt 43.7 kg (96 lb 5.5 oz)  BMI 16.03 kg/m2  SpO2 97%  General appearance: Thin elderly white female who is largely aphasic but able to respond to questions. She did speak fine when asked how she was doing and could shake her head yes and no to questions. Head: Normocephalic, without obvious abnormality, atraumatic Neck: no adenopathy, no carotid bruit, no JVD and supple, symmetrical, trachea midline Lungs: clear to auscultation bilaterally Heart: regular rate and rhythm, S1, S2 normal, no murmur, click, rub or gallop Abdomen: soft, non-tender; bowel sounds normal; no masses,  no organomegaly Pelvic: deferred Pulses: 2+ and symmetric Skin: Skin color, texture, turgor normal. No rashes or lesions Neurologic: Able to move her extremities, she is able to do some speaking but is a phasic.   Labs: CBC  Recent Labs  08/30/13 0240  WBC 10.4  RBC 3.60*  HGB 11.3*  HCT 34.1*  PLT 215  MCV 94.7  MCH 31.4  MCHC 33.1  RDW 13.2  LYMPHSABS 0.9  MONOABS 0.6  EOSABS 0.1  BASOSABS 0.0   CMP   Recent Labs  08/30/13 0240  NA 141  K 3.7  CL 107  CO2 20  GLUCOSE 104*  BUN 37*   CREATININE 1.60*  CALCIUM 8.3*  PROT 6.2  ALBUMIN 3.0*  AST 18  ALT 11  ALKPHOS 102  BILITOT 0.2*  GFRNONAA 28*  GFRAA 33*   BNP (last 3 results) No results found for this basename: PROBNP,  in the last 8760 hours Cardiac Panel (last 3 results)  Recent Labs  08/31/13 0900 08/31/13 1725 09/01/13 1131  TROPONINI 0.31* <0.30 <0.30     Radiology: Changes of emphysema, heart upper normal, no heart failure  EKG: EKG on admission showed sinus bradycardia with a right bundle branch block pattern and the prolonged QT, an EKG on the 19th showed rapid atrial fibrillation and she was back in sinus on the 20th.  Signed:  Darden Palmer MD [facility]  Cardiology Consultant  09/01/2013, 9:15 PM

## 2013-09-01 NOTE — Progress Notes (Signed)
Cardizem to 5mg , heart rate 60-70- Afib. Blood pressure 124/57.  Continue to watch.

## 2013-09-01 NOTE — Progress Notes (Addendum)
FMTS Attending Note  I personally saw and evaluated the patient. The plan of care was discussed with the resident team. I agree with the assessment and plan as documented by the resident.   1. CVA - failed swallow study, still has significant aphasia/dysarhria however is able to say her name today, will repeat swallow study today, family open to the idea of NGT/Peg tube however would prefer not to have to place either of these, currently only on ASA for secondary prevention due to failed swallow study 2. Afib - patient currently back on diltiazem drip, will wean as tolerated once no longer in RVR, will initiate low dose BB, she is a poor candidate for coumadin/xarelto as she is a fall risk, the residents are to discuss need for TEE (rule out atrial appendage clot) given recent CVA with neurology 3. Dementia - resume aricept/namenda once taking PO  PT/OT to work with patient. She would benefit from SNF placement at time of discharge. Discussed anticoagulation status/feeding status/and discharge plan with family.  Donnella Sham MD

## 2013-09-01 NOTE — Progress Notes (Signed)
Had 4.15 sec pause, MD notified.  BP 129/51. Cardizem at 5 mg, hr 83.  Resting quietly, no complaints.

## 2013-09-01 NOTE — Progress Notes (Signed)
HR. Down tom 44 SR.  BP 120/s Cardizem drip off. MD. Notified.  Continue to watch.

## 2013-09-01 NOTE — Progress Notes (Signed)
Pt converted back to afib 130'150's Dr. Gayla Doss called and then came up to floor new orders written will continue to monitor.

## 2013-09-01 NOTE — Progress Notes (Signed)
Family Medicine Teaching Service Daily Progress Note Intern Pager: [phone]  Patient name: [name] Medical record number: [id number] Date of birth: Feb 13, 1926 Age: 79 y.o. Gender: female  Primary Care Provider: Elvina Sidle, MD Consultants: Neuro Code Status: Full  Pt Overview and Major Events to Date:  9/19:  - MRI/MRA left MCA branch stroke  - Afib with RVR, transfer to stepdown and start dilt drip  9/20: Dilt drip held given normalized rate this AM 9/21: 6:30 AM pt went back into Afib with RVR 130s. Restarted dilt drip.   Assessment and Plan: [name] is a 79 y.o. female presenting with expressive aphasia and left sided weakness that started this afternoon. PMH is significant for HTN, dementia, gastric ca, PUD, and stroke in 2003 (takes ASA 81).   #Neuro - Stroke vs TIA - Clinical sx consistent with acute stroke with severe aphasia, not much improved. Admitted to neuro floor on tele. MRI/MRA with small left MCA acute infarct; carotid doppler 1-39% stenosis bilaterally. - PT/OT/Speech recommending HH and NPO - Troponin neg x 3, mildly pos x 1, neg x 1 - Discuss recs with neuro re: TEE, having to hold plavix while NPO, and HH vs SNF and need GOC discussion with family that we initiated over weekend [ ]   Family would like to trial swallow study before placing NG tube if able to do today. Otherwise, would like NG tube  #Atrial fibrillation with RVR - Patient presented with first instance of Afib with RVR 2 days ago, resolved yesterday morning on dilt drip so d/c'ed. However, 6:30 AM today went back into Afib with RVR - Diltiazem re-bolused and restarted drip - Monitor [ ]  Has asa 300mg  suppository; Needs plavix - pending decision re: swallow study again or place NG today; hesitate to anticoagulate given fall risk with dementia [ ]  f/u troponin x 2 [ ]  If diltiazem drip not successful at controlling rate, need to consider increasing treatment. Given recurrence, consider  long-term CCB.  # Dementia: Has outpatient neurologist. TIA 2003 and pt was on aspirin 81mg . - Mild dementia, taking namenda and aricept daily though currently being held while NPO per speech. - Restart when able or consider alternatives  #GU: S/p 3 days ceftriaxone IV for suspected UTI vs non-clean catch - Creatinine baseline yesterday   #H/o HTN - Currently only prescribed lasix for leg edema, per family. BP contiues to range from 120s-180s systolic. - Permissive HTN in setting of acute infarction with >200/100 to call FPTS for low dose hydralazine  #Left hip tenderness - Recent h/o hip fx surgery but no subsequent fever, skin breakdown, or fall.  - Monitor, possibly due to prolonged lying  #Coccygeal ulcer: Unstageable - Nursing placing padded dressings over sacrum - WOC consulted 9/20, f/u recs  FEN/GI: NPO per speech s/p MCA stroke; Family is considering NG tube for meds/feeds PPx: SQ heparin  Disposition: Pending clinical course; PT/OT recommend home health services upon discharge; CM consulted and [facility] has been contacted - Outpatient PET scan with Dr Vassie Loll for lung nodules  Subjective: Per family (son and dtr in room), spoke her name this morning though uncoordinatedly. Went into Afib with RVR around 6:30 this morning, no complaint of discomfort, chest pain, shortness of breath, or dizziness.  Objective: Temp:  [97.5 F (36.4 C)-98.6 F (37 C)] 98.2 F (36.8 C) (09/21 0345) Pulse Rate:  [63-69] 69 (09/21 0345) Resp:  [15-26] 24 (09/21 0345) BP: (129-172)/(38-59) 172/56 mmHg (09/21 0345) SpO2:  [97 %-100 %] 97 % (  09/21 0345) Physical Exam: General: NAD, lying in bed, does not speak Cardiovascular: Irregularly irregular rhythm with rapid rate 120s, no m/r/g Respiratory: CTAB, normal effort Abdomen: soft, nontender, nondistended, NABS Extremities: Atraumatic, no lower or upper extremity edema, no calf tenderness or eryhtema Neuro: No speech still (reports speech this  morning), smiles, interacts appropriately, follows commands  Laboratory:  Recent Labs Lab 08/29/13 1357 08/29/13 1412 08/29/13 2054 08/30/13 0240  WBC 6.7  --  7.2 10.4  HGB 11.5* 12.6 10.8* 11.3*  HCT 36.3 37.0 32.6* 34.1*  PLT 243  --  202 215    Recent Labs Lab 08/29/13 1357 08/29/13 1412 08/29/13 2054 08/30/13 0240  NA 137 141  --  141  K 3.8 3.9  --  3.7  CL 100 107  --  107  CO2 24  --   --  20  BUN 41* 40*  --  37*  CREATININE 1.73* 1.80* 1.61* 1.60*  CALCIUM 9.1  --   --  8.3*  PROT 6.9  --   --  6.2  BILITOT 0.3  --   --  0.2*  ALKPHOS 115  --   --  102  ALT 12  --   --  11  AST 22  --   --  18  GLUCOSE 109* 95  --  104*     Recent Labs Lab 08/30/13 0240 08/30/13 2026 08/31/13 0140 08/31/13 0900 08/31/13 1725  TROPONINI <0.30 <0.30 <0.30 0.31* <0.30     Imaging/Diagnostic Tests: None new  Leona Singleton, MD 09/01/2013, 7:09 AM PGY-2, [facility] FPTS Intern pager: [phone], text pages welcome

## 2013-09-01 NOTE — Progress Notes (Signed)
1630 had several pauses from 1.21sec to 4.64 sec pause.  Dr. Claiborne Billings aware.  Continue to watch patient.  Heart rate 87-120.

## 2013-09-01 NOTE — Progress Notes (Addendum)
Cardizem up to 15mg .  HR 130-140 sustained.  Blood pressure 140/66.  MD aware.  Continue to watch.

## 2013-09-01 NOTE — Progress Notes (Addendum)
Speech Language Pathology Dysphagia Treatment Patient Details Name: [name] MRN: [id number] DOB: 1926-03-07 Today's Date: 09/01/2013 Time: 5284-1324 SLP Time Calculation (min): 20 min  Assessment / Plan / Recommendation Clinical Impression  RN paged SLP to reassess swallow for possible improvement to possibly administer medication PO.  Improvement overall in comparision to treatment completed 9/20.  Vocal quality clear prior to trials.  Puree consistency administered x3 1/2 teaspoons with max verbal, tactile, and visual cues required for patient to initiate swallow.   Laryngeal elevation functional but inconsistent.    No coughing noted s/p trials.  Improvements noted in area of the swallow but continues with severe oral and pharyngeal dysphagia marked mainly by delay with inconsistent laryngeal elevation.  Recommend continued NPO status.  ST to f/u on 09/02/13 to assess readiness to complete objective assessment.      Diet Recommendation  Continue with Current Diet: NPO    SLP Plan FEES;Continue with current plan of care      Swallowing Goals  SLP Swallowing Goals Swallow Study Goal #3 - Progress: Progressing toward goal  General Respiratory Status: Room air Behavior/Cognition: Alert;Cooperative;Pleasant mood Oral Cavity - Dentition: Adequate natural dentition Patient Positioning: Upright in bed  Oral Cavity - Oral Hygiene Does patient have any of the following "at risk" factors?: Other - dysphagia;Saliva - thick, dry mouth Patient is HIGH RISK - Oral Care Protocol followed (see row info): Yes Patient is AT RISK - Oral Care Protocol followed (see row info): Yes   Dysphagia Treatment Treatment focused on: Upgraded PO texture trials Treatment Methods/Modalities: Skilled observation;Differential diagnosis Patient observed directly with PO's: Yes Type of PO's observed: Dysphagia 1 (puree) Feeding: Total assist Oral Phase Signs & Symptoms: Prolonged bolus formation;Prolonged oral  phase Pharyngeal Phase Signs & Symptoms: Suspected delayed swallow initiation Type of cueing: Verbal;Tactile Amount of cueing: Maximal   GO    Leslie Fowler MS, CCC-SLP [phone] [facility] 09/01/2013, 6:30 PM

## 2013-09-02 ENCOUNTER — Inpatient Hospital Stay (HOSPITAL_COMMUNITY): Payer: Medicare Other

## 2013-09-02 DIAGNOSIS — I6330 Cerebral infarction due to thrombosis of unspecified cerebral artery: Secondary | ICD-10-CM

## 2013-09-02 DIAGNOSIS — R1310 Dysphagia, unspecified: Secondary | ICD-10-CM | POA: Diagnosis present

## 2013-09-02 DIAGNOSIS — Z515 Encounter for palliative care: Secondary | ICD-10-CM

## 2013-09-02 DIAGNOSIS — R5381 Other malaise: Secondary | ICD-10-CM

## 2013-09-02 DIAGNOSIS — R531 Weakness: Secondary | ICD-10-CM | POA: Diagnosis present

## 2013-09-02 DIAGNOSIS — R471 Dysarthria and anarthria: Secondary | ICD-10-CM | POA: Diagnosis present

## 2013-09-02 LAB — GLUCOSE, CAPILLARY
Glucose-Capillary: 120 mg/dL — ABNORMAL HIGH (ref 70–99)
Glucose-Capillary: 113 mg/dL — ABNORMAL HIGH (ref 70–99)

## 2013-09-02 LAB — BASIC METABOLIC PANEL
BUN: 29 mg/dL — ABNORMAL HIGH (ref 6–23)
Calcium: 9.3 mg/dL (ref 8.4–10.5)
Chloride: 114 mEq/L — ABNORMAL HIGH (ref 96–112)
Creatinine, Ser: 1.79 mg/dL — ABNORMAL HIGH (ref 0.50–1.10)
GFR calc Af Amer: 29 mL/min — ABNORMAL LOW (ref >90)
GFR calc non Af Amer: 25 mL/min — ABNORMAL LOW (ref >90)

## 2013-09-02 LAB — TROPONIN I: Troponin I: <0.3 ng/mL (ref <0.30)

## 2013-09-02 LAB — TSH: TSH: 1.446 u[IU]/mL (ref 0.350–4.500)

## 2013-09-02 LAB — CBC
HCT: 30.1 % — ABNORMAL LOW (ref 36.0–46.0)
MCHC: 31.2 g/dL (ref 30.0–36.0)
MCV: 97.1 fL (ref 78.0–100.0)
RDW: 14.2 % (ref 11.5–15.5)

## 2013-09-02 MED ORDER — AMIODARONE HCL 200 MG PO TABS
200.0000 mg | ORAL_TABLET | Freq: Two times a day (BID) | ORAL | Status: DC
  Administered 2013-09-03 – 2013-09-05 (×3): 200 mg via ORAL
  Filled 2013-09-02 (×8): qty 1

## 2013-09-02 MED ORDER — JEVITY 1.2 CAL PO LIQD
1000.0000 mL | ORAL | Status: DC
  Administered 2013-09-03 – 2013-09-05 (×2): 1000 mL
  Filled 2013-09-02 (×5): qty 1000

## 2013-09-02 MED ORDER — JEVITY 1.2 CAL PO LIQD: 1000.0000 mL | ORAL | Status: DC

## 2013-09-02 NOTE — Progress Notes (Addendum)
Attending Addendum  I examined the patient and discussed the assessment and plan with Dr. Waynetta Sandy. I have reviewed the note and agree. Patient failed her MBS today. Patient was unable to tolerate first attempt to place NG tube. As she was removing the tube she developed oral bleeding the source of the bleeding could not be visualized. I suspect esophageal. The bleeding was controlled by her RNs with suctioning and has decreased significantly by the time of my eval. Plan is to cancel tube feeding placement and reattempt it tomorrow. Will also hold her transfer out of stepdown until her bleeding has completely resolved.     Dessa Phi, MD FAMILY MEDICINE TEACHING SERVICE

## 2013-09-02 NOTE — Progress Notes (Signed)
Attempted to place Nasal feeding tube in right nare, met resistance, 2nd RN tried left nare was able to insert up to 25, pt pulled tube out abruptly. MD aware and new orders obtained for placement of Panda feeding tube under flora tomorrow. Will continue to monitor patient.

## 2013-09-02 NOTE — Progress Notes (Signed)
Stroke Team Progress Note  HISTORY  [name] is an 79 y.o. female with a history of hypertension, dementia, stomach cancer and peptic ulcer disease, presenting with new onset of inability to speak. Patient reportedly was inattentive to those around her and attempted to speak but could not utter any sounds and was aware that she could not speak. She was last seen well at noon today. His previous history of stroke in 2003. She's been on aspirin 81 mg per day. CT scan of her head showed no acute intracranial abnormality. Her ability to speak improved markedly while being evaluated in the emergency room. She had mild residual dysarthria. She also did not remember her correct age. NIH stroke score was 2. Patient had foul-smelling urine and was hypothermic with a rectal temperature of 96.9. She's being admitted for TIA workup as well as for possible urosepsis.   LSN: Noon on 08/29/2013  tPA Given: No: Rapidly resolving deficits.  MRankin: 2   She was admitted for further evaluation and treatment.  SUBJECTIVE Had some PAF. Palliative care involved.  OBJECTIVE Most recent Vital Signs: Filed Vitals:   09/02/13 0800 09/02/13 1118 09/02/13 1200 09/02/13 1500  BP: 165/60  169/64   Pulse:      Temp: 98.5 F (36.9 C) 98.2 F (36.8 C) 98.2 F (36.8 C) 97.9 F (36.6 C)  TempSrc: Axillary Oral Oral Oral  Resp:      Height:      Weight:      SpO2:       CBG (last 3)   Recent Labs  09/02/13 0617 09/02/13 0712 09/02/13 1101  GLUCAP 113* 112* 124*    IV Fluid Intake:   . dextrose 5 % and 0.45% NaCl    . feeding supplement (JEVITY 1.2 CAL)      MEDICATIONS  . amiodarone  200 mg Oral BID  . aspirin  300 mg Rectal Daily  . atorvastatin  40 mg Oral q1800  . clopidogrel  75 mg Oral Q breakfast  . heparin  5,000 Units Subcutaneous Q8H   PRN:  acetaminophen, acetaminophen, hydrALAZINE, senna-docusate  Diet:  NPO==jevity Activity:  Bedrest DVT Prophylaxis:  Compression  devices  CLINICALLY SIGNIFICANT STUDIES Basic Metabolic Panel:   Recent Labs Lab 09/01/13 2040 09/02/13 0924  NA 147* 146*  K 3.7 3.5  CL 114* 114*  CO2 15* 19  GLUCOSE 87 117*  BUN 29* 29*  CREATININE 1.74* 1.79*  CALCIUM 9.0 9.3   Liver Function Tests:   Recent Labs Lab 08/29/13 1357 08/30/13 0240  AST 22 18  ALT 12 11  ALKPHOS 115 102  BILITOT 0.3 0.2*  PROT 6.9 6.2  ALBUMIN 3.1* 3.0*   CBC:  Recent Labs Lab 08/29/13 1357  08/30/13 0240 09/02/13 0924  WBC 6.7  < > 10.4 7.0  NEUTROABS 4.4  --  8.8*  --   HGB 11.5*  < > 11.3* 9.4*  HCT 36.3  < > 34.1* 30.1*  MCV 96.0  < > 94.7 97.1  PLT 243  < > 215 202  < > = values in this interval not displayed. Coagulation:   Recent Labs Lab 08/29/13 1357 08/30/13 0240  LABPROT 12.0 12.9  INR 0.90 0.99   Cardiac Enzymes:   Recent Labs Lab 09/01/13 1131 09/01/13 2040 09/02/13 0145  TROPONINI <0.30 <0.30 <0.30   Urinalysis:   Recent Labs Lab 08/29/13 1439  COLORURINE YELLOW  LABSPEC 1.010  PHURINE 8.0  GLUCOSEU NEGATIVE  HGBUR MODERATE*  BILIRUBINUR  NEGATIVE  KETONESUR NEGATIVE  PROTEINUR 100*  UROBILINOGEN 0.2  NITRITE NEGATIVE  LEUKOCYTESUR LARGE*   Lipid Panel    Component Value Date/Time   CHOL 215* 08/30/2013 0240   TRIG 85 08/30/2013 0240   HDL 87 08/30/2013 0240   CHOLHDL 2.5 08/30/2013 0240   VLDL 17 08/30/2013 0240   LDLCALC 111* 08/30/2013 0240   HgbA1C  Lab Results  Component Value Date   HGBA1C 5.9* 08/30/2013    Urine Drug Screen:     Component Value Date/Time   LABOPIA NONE DETECTED 08/29/2013 1439   COCAINSCRNUR NONE DETECTED 08/29/2013 1439   LABBENZ NONE DETECTED 08/29/2013 1439   AMPHETMU NONE DETECTED 08/29/2013 1439   THCU NONE DETECTED 08/29/2013 1439   LABBARB NONE DETECTED 08/29/2013 1439    Alcohol Level:   Recent Labs Lab 08/29/13 1357  ETH <11    Dg Chest 2 View 08/30/2013   1. No evidence of acute cardiopulmonary disease. 2. Chronic cardiomegaly without  failure.     Ct Head Wo Contrast 08/29/2013    Atrophy with fairly extensive small vessel disease. No demonstrable mass, hemorrhage, or acute appearing infarct.   Mr Brain Wo Contrast 08/30/2013     1. Small/patchy left MCA acute infarct. No mass effect or hemorrhage.  2. Otherwise stable MRI appearance of the brain since 2009.  3. Increased chronic left mastoid effusion. Mild paranasal sinus inflammation.   Mr Maxine Glenn Head/brain Wo Cm 08/30/2013    No major left MCA branch occlusion or stenosis. Negative for age intracranial MRA.    2D Echocardiogram  EF 70%, wall motion normal, LA size normal  Carotid Doppler  Findings consistent with 1-39 percent stenosis involving the right internal carotid artery and the left internal carotid artery  EKG  normal sinus rhythm.   Therapy Recommendations home health  Physical Exam   Frail elderly caucasian lady in bed.Awake alert. Afebrile. Head is nontraumatic. Neck is supple without bruit. Hearing is normal. Cardiac exam no murmur or gallop. Lungs are clear to auscultation. Distal pulses are well felt. Neurological Exam ; awake alertc. Non-verbal. Follows simple commands (close eyes, no 2 step commands). Follows occasional midline gestures. She is able to visually track in either directions. She blinks to threat bilaterally. No facial weakness. Tongue is midline. Motor system exam reveals no upper or lower extremity drift. Symmetric and equal strength in all 4 extremities though her cooperation is limited. No focal weakness. Sensation and coordination cannot be reliably tested. Gait was not tested. ASSESSMENT [name] is a 79 y.o. female presenting with aphasia. Imaging confirms a left MCA infarct. Infarct felt to be secondary to small vessel disease.  On no antithrombotics prior to admission. Now on ASA 300mg  daily for secondary stroke prevention. Patient with resultant aphasia.    Dementia  Hypertension  Stomach cancer  Peptic ulcer  disease  Hyperlipidemia, LDL 111, goal < 100 in non diabetics, < 70 in diabetic patients, statin added  Anemia  A fib with RVR  Renal insufficiency  Abnormal urine, on antibiotics-completed.  Long term medication use  Hospital day # 4  TREATMENT/PLAN  Continue Aspirin 300mg  PR plus Plavix for secondary stroke prevention.   Due to dementia, fall risk, patient is likely poor candidate for anticoagulation related to A fib.   Would hold off on TEE at this time, can consider as outpatient; however, suspect this would not change treatment  CIR, However, palliative care consulted by primary team as well  Failed speech, remains  NPO, discuss NG with family if NPO prolonged course; however could ultimately need PEG.  Gwendolyn Lima. Manson Passey, [facility], MBA, MHA [facility] Pager: [phone] 09/02/2013 3:58 PM  I have personally  , evaluated imaging results, and formulated the assessment and plan of care. I agree with the above. Delia Heady, MD

## 2013-09-02 NOTE — Clinical Documentation Improvement (Signed)
THIS DOCUMENT IS NOT A PERMANENT PART OF THE MEDICAL RECORD  Please update your documentation with the medical record to reflect your response to this query. If you need help knowing how to do this please call [phone].  09/02/13   Dr. Waynetta Sandy,  In a better effort to capture your patient's severity of illness, reflect appropriate length of stay and utilization of resources, a review of the patient medical record has revealed the following information:  Progress Notes signed by Ailene Ards, RD at 08/30/2013 3:42 PM    Author: Ailene Ards, RD Service: Nutritional Management Author Type: Registered Dietitian   Filed: 08/30/2013 3:42 PM Note Time: 08/30/2013 3:42 PM         INITIAL NUTRITION ASSESSMENT    DOCUMENTATION CODES  Per approved criteria    -Underweight    INTERVENTION:  Advance diet as medically appropriate  RD to follow for nutrition care plan, add interventions accordingly  NUTRITION DIAGNOSIS:  Inadequate oral intake related to inability to eat as evidenced by NPO status  Goal:  Pt to meet >/= 90% of their estimated nutrition needs  Monitor:  PO diet advancement & intake, weight, labs, I/O's  Reason for Assessment: Malnutrition Screening Tool Report  79 y.o. female  Admitting Dx: acute aphasia, AMS, unilateral weakness  ASSESSMENT:  Patient with PMH significant for HTN, dementia, gastric cancer, PUD, and stroke in 2003; presented with expressive aphasia and left sided weakness; probable left MCA subcortical TIA.  RD spoke with patient's family who report patient eats well at home; does eat 3 meals per day; weight has been stable; they state she's always been "small framed;" RD spoke with Marchelle Folks, SLP regarding swallow evaluation at bedside ---> recommending NPO status at this time.  Height:  Ht Readings from Last 1 Encounters:   08/29/13  5\' 5"  (1.651 m)   Weight:  Wt Readings from Last 1 Encounters:   08/29/13  102 lb (46.267 kg)   Ideal Body  Weight: 125 lb  % Ideal Body Weight: 82% BMI: Body mass index is 16.97 kg/(m^2).    Based on your clinical judgment, please document in the progress notes and discharge summary if a condition below provides greater specificity regarding the patient's nutritional status:  X - Underweight, BMI 16.9   - Other Condition   - Unable to Clinically Determine   In responding to this query please exercise your independent judgment.  The fact that a query is asked, does not imply that any particular answer is desired or expected.   Reviewed: additional documentation in the medical record  Thank You,  Jerral Ralph  RN BSN CCDS Certified Clinical Documentation Specialist: [phone] Health Information Management [facility]   Tawni Carnes, MD 09/02/2013, 12:04 PM PGY-1, [facility] FPTS Intern Pager: [phone], text pages welcome

## 2013-09-02 NOTE — Progress Notes (Signed)
OT Cancellation Note  Patient Details Name: [name] MRN: [id number] DOB: [birth year]   Cancelled Treatment:    Reason Eval/Treat Not Completed: Other (comment) Son having private discussion with his mother and asked this therapist to return later. Will attempt to see this pm [facility], OTR/L  [phone] 09/02/2013 09/02/2013, 1:20 PM

## 2013-09-02 NOTE — Progress Notes (Signed)
Speech Language Pathology  Patient Details Name: [name] MRN: [id number] DOB: [birth year] Today's Date: 09/02/2013 Time:  -    MBS scheduled for 0930 this morning.  Breck Coons [place]Ed [employer] [phone]  09/02/2013

## 2013-09-02 NOTE — Progress Notes (Addendum)
SUBJECTIVE: The patient is doing well today.  At this time, she denies chest pain, shortness of breath, or any new concerns.  She has difficulty with verbal communication due to dementia.  Denies dizziness, presyncope or syncope.  Marland Kitchen amiodarone  200 mg Oral BID  . aspirin  300 mg Rectal Daily  . atorvastatin  40 mg Oral q1800  . clopidogrel  75 mg Oral Q breakfast  . heparin  5,000 Units Subcutaneous Q8H   . dextrose 5 % and 0.45% NaCl      OBJECTIVE: Physical Exam: Filed Vitals:   09/01/13 2334 09/02/13 0453 09/02/13 0700 09/02/13 0800  BP: 157/68 155/58  165/60  Pulse: 59 69    Temp: 97.8 F (36.6 C) 97.9 F (36.6 C) 98.5 F (36.9 C) 98.5 F (36.9 C)  TempSrc: Oral Oral Axillary Axillary  Resp: 15 17    Height:      Weight:      SpO2: 97% 97%      Intake/Output Summary (Last 24 hours) at 09/02/13 1610 Last data filed at 09/02/13 0800  Gross per 24 hour  Intake   1285 ml  Output      4 ml  Net   1281 ml    Telemetry reveals sinus rhythm today  GEN- The patient is elderly and frail appearing, sleeping but rouses Head- normocephalic, atraumatic Eyes-  Sclera clear, conjunctiva pink Ears- hearing intact Oropharynx- clear Neck- supple,  Lungs- Clear to ausculation bilaterally, normal work of breathing Heart- Regular rate and rhythm  GI- soft, NT, ND, + BS gallop Extremities- no clubbing, cyanosis, or edema Skin- no rash or lesion  LABS: Basic Metabolic Panel:  Recent Labs  96/04/54 2040  NA 147*  K 3.7  CL 114*  CO2 15*  GLUCOSE 87  BUN 29*  CREATININE 1.74*  CALCIUM 9.0   Liver Function Tests: No results found for this basename: AST, ALT, ALKPHOS, BILITOT, PROT, ALBUMIN,  in the last 72 hours No results found for this basename: LIPASE, AMYLASE,  in the last 72 hours CBC: No results found for this basename: WBC, NEUTROABS, HGB, HCT, MCV, PLT,  in the last 72 hours Cardiac Enzymes:  Recent Labs  09/01/13 1131 09/01/13 2040 09/02/13 0145    TROPONINI <0.30 <0.30 <0.30   BNP: No components found with this basename: POCBNP,  D-Dimer: No results found for this basename: DDIMER,  in the last 72 hours Hemoglobin A1C: No results found for this basename: HGBA1C,  in the last 72 hours Fasting Lipid Panel: No results found for this basename: CHOL, HDL, LDLCALC, TRIG, CHOLHDL, LDLDIRECT,  in the last 72 hours Thyroid Function Tests: No results found for this basename: TSH, T4TOTAL, FREET3, T3FREE, THYROIDAB,  in the last 72 hours Anemia Panel: No results found for this basename: VITAMINB12, FOLATE, FERRITIN, TIBC, IRON, RETICCTPCT,  in the last 72 hours  RADIOLOGY: Dg Chest 2 View  08/30/2013   CLINICAL DATA:  Stroke and dementia.  EXAM: CHEST  2 VIEW  COMPARISON:  03/25/2012  FINDINGS: Chronic cardiopericardial enlargement. Hyperinflated lungs. No edema, effusion, infiltrate, or pneumothorax. A known left lower lobe pulmonary nodule is not well seen by radiography. No acute osseous findings. There is osseous demineralization and notable bilateral glenohumeral osteoarthritis.  IMPRESSION: 1. No evidence of acute cardiopulmonary disease. 2. Chronic cardiomegaly without failure.   Electronically Signed   By: Tiburcio Pea   On: 08/30/2013 03:33   Ct Head Wo Contrast  08/29/2013   CLINICAL DATA:  Altered mental status ; pinpoint pupils  EXAM: CT HEAD WITHOUT CONTRAST  TECHNIQUE: Contiguous axial images were obtained from the base of the skull through the vertex without intravenous contrast. Study was obtained within 24 hr of patient's arrival at the emergency department.  COMPARISON:  Brain MRI January 04, 2008  FINDINGS: There is moderate diffuse atrophy. There is no mass, hemorrhage, extra-axial fluid collection, or midline shift. There is patchy small vessel disease in the centra semiovale bilaterally. These changes are greatest in the posterior centra semiovale, slightly more severe on the right than on the left. No acute appearing  infarct is seen on this study. There is no new gray-white compartment lesion.  Bony calvarium appears intact. The mastoid air cells are clear.  IMPRESSION: Atrophy with fairly extensive small vessel disease. No demonstrable mass, hemorrhage, or acute appearing infarct.  CriticalValue/emergent results were called by telephone at the time of interpretation on 08/29/2013 at [facility] , who verbally acknowledged these results.   Electronically Signed   By: Bretta Bang   On: 08/29/2013 14:11   Mr Brain Wo Contrast  08/30/2013   CLINICAL DATA:  79 year old female with confusion. Altered mental status. Stroke suspected.  EXAM: MRI HEAD WITHOUT CONTRAST  MRA HEAD WITHOUT CONTRAST  TECHNIQUE: Multiplanar, multiecho pulse sequences of the brain and surrounding structures were obtained without intravenous contrast. Angiographic images of the head were obtained using MRA technique without contrast.  COMPARISON:  Head CT without contrast 08/29/2013. Brain MRI 01/04/2008.  FINDINGS: MRI HEAD FINDINGS  Patchy restricted diffusion in the left MCA territory, mostly affecting the insula and central white matter. Subtle associated T2 and FLAIR hyperintensity. No mass effect or associated hemorrhage.  No right hemisphere or posterior fossa restricted diffusion. Major intracranial vascular flow voids are stable. There is a small area of increased FLAIR signal associated with left MCA M2 branches (series 8, image 13. See MRA findings.  Stable cerebral volume. No midline shift, mass effect, evidence of mass lesion, ventriculomegaly, extra-axial collection or acute intracranial hemorrhage. Cervicomedullary junction and pituitary are within normal limits. Chronic patchy in confluent scattered cerebral white matter T2 and FLAIR hyperintensity has not significantly changed. Negative visualized cervical spine. Normal bone marrow signal.  Stable orbits. Chronic left mastoid effusion has increased. Mild paranasal sinus  fluid and mucosal thickening is increased. The right mastoids are clear. Negative scalp soft tissues.  MRA HEAD FINDINGS  Antegrade flow in the posterior circulation with fairly codominant distal vertebral arteries. Normal right PICA. Dominant left AICA. Patent vertebrobasilar junction. No basilar artery stenosis. SCA and PCA origins are within normal limits. Bilateral PCA branches are within normal limits.  Antegrade flow in both ICA siphons. No ICA stenosis. Ophthalmic artery origins are within normal limits. Posterior communicating arteries are diminutive or absent. Carotid termini are patent. MCA and ACA origins are within normal limits. Tortuous but otherwise negative visualized ACA branches. Visualized right MCA branches are within normal limits.  Left MCA M1 segment is patent and within normal limits. Left MCA bifurcation is patent. Proximal M2 branches are patent and within normal limits. There is no left MCA major branch occlusion or stenosis identified.  IMPRESSION: MRI HEAD IMPRESSION  1. Small/patchy left MCA acute infarct. No mass effect or hemorrhage.  2. Otherwise stable MRI appearance of the brain since 2009.  3. Increased chronic left mastoid effusion. Mild paranasal sinus inflammation.  MRA HEAD IMPRESSION  No major left MCA branch occlusion or stenosis. Negative for age intracranial MRA.  Electronically Signed   By: Augusto Gamble M.D.   On: 08/30/2013 09:47   Dg Chest Port 1 View  08/31/2013   CLINICAL DATA:  AFib with RVR, cough  EXAM: PORTABLE CHEST - 1 VIEW  COMPARISON:  08/29/2013  FINDINGS: Suspected emphysematous changes with hyperinflation. No pleural effusion or pneumothorax.  The heart is top-normal in size.  Degenerative changes of the bilateral shoulders.  IMPRESSION: No evidence of acute cardiopulmonary disease.   Electronically Signed   By: Charline Bills M.D.   On: 08/31/2013 09:25   Mr Maxine Glenn Head/brain Wo Cm  08/30/2013   CLINICAL DATA:  79 year old female with confusion.  Altered mental status. Stroke suspected.  EXAM: MRI HEAD WITHOUT CONTRAST  MRA HEAD WITHOUT CONTRAST  TECHNIQUE: Multiplanar, multiecho pulse sequences of the brain and surrounding structures were obtained without intravenous contrast. Angiographic images of the head were obtained using MRA technique without contrast.  COMPARISON:  Head CT without contrast 08/29/2013. Brain MRI 01/04/2008.  FINDINGS: MRI HEAD FINDINGS  Patchy restricted diffusion in the left MCA territory, mostly affecting the insula and central white matter. Subtle associated T2 and FLAIR hyperintensity. No mass effect or associated hemorrhage.  No right hemisphere or posterior fossa restricted diffusion. Major intracranial vascular flow voids are stable. There is a small area of increased FLAIR signal associated with left MCA M2 branches (series 8, image 13. See MRA findings.  Stable cerebral volume. No midline shift, mass effect, evidence of mass lesion, ventriculomegaly, extra-axial collection or acute intracranial hemorrhage. Cervicomedullary junction and pituitary are within normal limits. Chronic patchy in confluent scattered cerebral white matter T2 and FLAIR hyperintensity has not significantly changed. Negative visualized cervical spine. Normal bone marrow signal.  Stable orbits. Chronic left mastoid effusion has increased. Mild paranasal sinus fluid and mucosal thickening is increased. The right mastoids are clear. Negative scalp soft tissues.  MRA HEAD FINDINGS  Antegrade flow in the posterior circulation with fairly codominant distal vertebral arteries. Normal right PICA. Dominant left AICA. Patent vertebrobasilar junction. No basilar artery stenosis. SCA and PCA origins are within normal limits. Bilateral PCA branches are within normal limits.  Antegrade flow in both ICA siphons. No ICA stenosis. Ophthalmic artery origins are within normal limits. Posterior communicating arteries are diminutive or absent. Carotid termini are patent.  MCA and ACA origins are within normal limits. Tortuous but otherwise negative visualized ACA branches. Visualized right MCA branches are within normal limits.  Left MCA M1 segment is patent and within normal limits. Left MCA bifurcation is patent. Proximal M2 branches are patent and within normal limits. There is no left MCA major branch occlusion or stenosis identified.  IMPRESSION: MRI HEAD IMPRESSION  1. Small/patchy left MCA acute infarct. No mass effect or hemorrhage.  2. Otherwise stable MRI appearance of the brain since 2009.  3. Increased chronic left mastoid effusion. Mild paranasal sinus inflammation.  MRA HEAD IMPRESSION  No major left MCA branch occlusion or stenosis. Negative for age intracranial MRA.   Electronically Signed   By: Augusto Gamble M.D.   On: 08/30/2013 09:47    ASSESSMENT AND PLAN:  Principal Problem:   Stroke, acute, embolic Active Problems:   TIA (transient ischemic attack)   Hypertension   Atrial fibrillation  1. afib Presently in sinus rhythm Would start amiodarone 200mg  BID to keep her in sinus.  (Swallow study planned for today).  If she cannot take POs, could consider IV amiodarone, though I would prefer PO route. Check tfts I agree with Dr Donnie Aho  that she should be anticoagulated if possible (See below)  2. Tachy/brady The patient had several long pauses on diltiazem.  Given her advanced age and paucity of symptoms, in the setting of her comorbidities, we should avoid PPM if able.  We will start amiodarone to try to keep her in sinus and follow while here on telemetry  3. Cva/ stroke risks The patient has afib and very high chads2vasc score (at least 6).  She should be anticoagulated long term.  Active W showed inadequate stroke prevention in afib patients with ASA/Plavix with bleeding risks no different that coumadin.  I would therefore recommend coumadin long term if no active bleeding if this can be logistically managed. Her CrCl is 16.  She is therefore not a  candidate for novel anticoagulants.  She could transfer to telemetry from my standpoint.  I will defer to primary team. Palliative measures would seem most appropriate.  Consult to palliative care is noted.  Her prognosis is very poor.   Hillis Range, MD 09/02/2013 8:22 AM

## 2013-09-02 NOTE — Clinical Documentation Improvement (Signed)
THIS DOCUMENT IS NOT A PERMANENT PART OF THE MEDICAL RECORD  Please update your documentation with the medical record to reflect your response to this query. If you need help knowing how to do this please call [phone]  09/02/13  Dr. Waynetta Sandy,  In a better effort to capture your patient's severity of illness, reflect appropriate length of stay and utilization of resources, a review of the patient medical record has revealed the following information:   - Documented "Coccygeal Ulcer, Unstageable" by Dr. Briant Sites progress note 08/31/13    - Wound Care Nurse Note 09/02/13 Reason for Consult: Pressure ulcer to sacrum, present on admission  Wound type: Pressure ulcer  Pressure Ulcer POA: Yes  Measurement: 0.5 cm x 1 cm  Wound bed:100% adherent yellow slough.  Drainage (amount, consistency, odor) None  Periwound: Intact  Dressing procedure/placement/frequency: Continue Allevyn sacral dressing. Due to small size of wound and limited intake, do not recommend enzymatic debriding. Moist wound environment from foam will promote autolysis of slough. Change every 3 days and PRN soilage.  Will not follow at this time. Please re-consult if needed.  Maple Hudson RN BSN CWON  Pager [phone]   Based on your clinical judgment, and if you agree with the WOC Nurse's assessment, please document in the progress notes and discharge summary the:  Type of Ulcer:  X - Decubitus  X - Pressure  And   Whether or not:  X - Present on Admission  - Not Present on Admission and developed during the hospital stay    Reviewed: additional documentation in the medical record  Thank You,  Jerral Ralph  RN BSN CCDS Certified Clinical Documentation Specialist [phone] Health Information Management [place]  Tawni Carnes, MD 09/02/2013, 12:06 PM PGY-1, [facility] FPTS Intern Pager: [phone], text pages welcome

## 2013-09-02 NOTE — Progress Notes (Addendum)
NUTRITION CONSULT/FOLLOW UP  DOCUMENTATION CODES Per approved criteria  -Underweight   Intervention:    Initiate Jevity 1.2 formula at 15 ml/hr and increase by 10 ml every 4 hours to goal rate of 45 ml/hr to provide 1296 total kcals, 60 gm protein, 872 ml of free water RD to follow for nutrition care plan  Nutrition Dx:   Inadequate oral intake related to inability to eat as evidenced by NPO status, ongoing  Goal:   EN to meet > 90% of estimated nutrition needs, currently unmet  Monitor:   EN regimen & tolerance, weight, labs, I/O's  Assessment:   Patient with PMH significant for HTN, dementia, gastric cancer, PUD, and stroke in 2003; presented with expressive aphasia and left sided weakness; probable left MCA subcortical TIA.  Dysphagia Treatment note reviewed 9/21.  SLP recommending continued NPO status.  S/p MBSS this AM.  Orders to place NG feeding tube.  RD consulted EN initiation & managment.  Height: Ht Readings from Last 1 Encounters:  08/30/13 5\' 5"  (1.651 m)    Weight Status:   Wt Readings from Last 1 Encounters:  08/30/13 96 lb 5.5 oz (43.7 kg)    Re-estimated needs:  Kcal: 1200-1400 Protein: 50-60 gm Fluid: >/= 1.5 L  Skin: pressure ulcer to sacrum  Diet Order: NPO   Intake/Output Summary (Last 24 hours) at 09/02/13 1008 Last data filed at 09/02/13 0900  Gross per 24 hour  Intake   1205 ml  Output      4 ml  Net   1201 ml    Labs:   Recent Labs Lab 08/29/13 1357 08/29/13 1412 08/29/13 2054 08/30/13 0240 09/01/13 2040  NA 137 141  --  141 147*  K 3.8 3.9  --  3.7 3.7  CL 100 107  --  107 114*  CO2 24  --   --  20 15*  BUN 41* 40*  --  37* 29*  CREATININE 1.73* 1.80* 1.61* 1.60* 1.74*  CALCIUM 9.1  --   --  8.3* 9.0  GLUCOSE 109* 95  --  104* 87    CBG (last 3)   Recent Labs  09/01/13 1719 09/01/13 2333 09/02/13 0617  GLUCAP 93 120* 113*    Scheduled Meds: . amiodarone  200 mg Oral BID  . aspirin  300 mg Rectal Daily  .  atorvastatin  40 mg Oral q1800  . clopidogrel  75 mg Oral Q breakfast  . heparin  5,000 Units Subcutaneous Q8H    Continuous Infusions: . dextrose 5 % and 0.45% NaCl      Maureen Chatters, RD, LDN Pager #: [phone] After-Hours Pager #: [phone]

## 2013-09-02 NOTE — Consult Note (Signed)
Patient [name]      DOB: 24-Oct-1926      AOZ:[id number]     Consult Note from the Palliative Medicine Team at [facility]    Consult Requested by: Dr Leveda Anna     PCP: Elvina Sidle, MD Reason for Consultation: Clarification of GOC and options     Phone Number:[phone]  Assessment of patients Current state:  [name] is a 79 y.o. female with a history of hypertension, mild dementia, stomach cancer and peptic ulcer disease, presenting with new onset of inability to speak. Patient reportedly was inattentive to those around her and attempted to speak but could not utter any sounds and was aware that she could not speak. CT scan of her head showed no acute intracranial abnormality.  MRI/MRA brain done revealing small/patchy left MCA infarct and no stenosis. 2D echo with EF 65-70% and grade 1 diastolic dysfunction.  Swallow evaluation done and NPO recommended.    Goals of Care: 1.  Code Status : Full code   2. Scope of Treatment:  At this time son Ashyra Cantin C# [id number], stated main decesion maker for this patient is open to all available and offered medical interventions to prolong life.    4. Disposition: Pending, recommend rehabilitation facility with eventual discharge home.   3. Symptom Management:   1. Dysphagia: open to artifical feeding, encouraged family to view this as a trail intervention.  We discussed risks and benefits of long term PEG placement 2. Weakness: PT/OT   4. Psychosocial:  Emotional support offered to son at bedside, easily upset and frustrated with any conversation regarding advanced directives.     Patient Documents Completed or Given: Document Given Completed  Advanced Directives Pkt    MOST    DNR    Gone from My Sight    Hard Choices yes     Brief HPI: [name] is a 79 y.o. female with a history of hypertension, mild dementia, stomach cancer and peptic ulcer disease, presenting with new onset of inability to speak. Patient  reportedly was inattentive to those around her and attempted to speak but could not utter any sounds and was aware that she could not speak. CT scan of her head showed no acute intracranial abnormality.  MRI/MRA brain done revealing small/patchy left MCA infarct and no stenosis. 2D echo with EF 65-70% and grade 1 diastolic dysfunction.  Swallow evaluation done and NPO recommended.     ROS: unable to illicit due to altered communication    PMH:  Past Medical History  Diagnosis Date  . Dementia   . Hypertension   . Ulcer     Hx of  . Stomach cancer   . Snoring   . Fracture, intertrochanteric, left femur 03/25/2012  . Fracture, intertrochanteric, right femur 06/2008    s/p closed reduction and internal fixation  . Nodule of left lung     followed by Dr. Vassie Loll // LLL spiculated nodule noted per CT in 2012 1.5x1.6x2.4, PET in 06/03/2011 was low uptake.     PSH: Past Surgical History  Procedure Laterality Date  . Stomach surgery      3/4 of stomach removed due to stomach cancer  . Hip surgery  2009    right  . Total hip arthroplasty  03/29/2012    Procedure: TOTAL HIP ARTHROPLASTY;  Surgeon: Eulas Post, MD;  Location: [facility] OR;  Service: Orthopedics;  Laterality: Left;  Left Hip Hemiarthroplasty   I have reviewed the FH and  SH and  If appropriate update it with new information. No Known Allergies Scheduled Meds: . amiodarone  200 mg Oral BID  . aspirin  300 mg Rectal Daily  . atorvastatin  40 mg Oral q1800  . clopidogrel  75 mg Oral Q breakfast  . heparin  5,000 Units Subcutaneous Q8H   Continuous Infusions: . dextrose 5 % and 0.45% NaCl     PRN Meds:.acetaminophen, acetaminophen, hydrALAZINE, senna-docusate    BP 165/60  Pulse 69  Temp(Src) 98.5 F (36.9 C) (Axillary)  Resp 17  Ht 5\' 5"  (1.651 m)  Wt 43.7 kg (96 lb 5.5 oz)  BMI 16.03 kg/m2  SpO2 97%   PPS: 30 % at best   Intake/Output Summary (Last 24 hours) at 09/02/13 1058 Last data filed at 09/02/13  1000  Gross per 24 hour  Intake   1255 ml  Output      4 ml  Net   1251 ml    Physical Exam:  General: chronically ill appearing, frail HEENT:  Mm, no exudate Chest:   diminished in bases  CTA CVS: RRR Abdomen:soft NT +BS Ext: without edema Neuro:alert, follows commands, communicates with eys expression, non verbal  Labs: CBC    Component Value Date/Time   WBC 7.0 09/02/2013 0924   WBC 6.7 09/18/2007 1403   RBC 3.10* 09/02/2013 0924   RBC 4.12 09/18/2007 1403   HGB 9.4* 09/02/2013 0924   HGB 12.7 09/18/2007 1403   HCT 30.1* 09/02/2013 0924   HCT 37.1 09/18/2007 1403   PLT 202 09/02/2013 0924   PLT 310 09/18/2007 1403   MCV 97.1 09/02/2013 0924   MCV 90.2 09/18/2007 1403   MCH 30.3 09/02/2013 0924   MCH 30.8 09/18/2007 1403   MCHC 31.2 09/02/2013 0924   MCHC 34.1 09/18/2007 1403   RDW 14.2 09/02/2013 0924   RDW 12.3 09/18/2007 1403   LYMPHSABS 0.9 08/30/2013 0240   LYMPHSABS 0.8* 09/18/2007 1403   MONOABS 0.6 08/30/2013 0240   MONOABS 0.5 09/18/2007 1403   EOSABS 0.1 08/30/2013 0240   EOSABS 0.0 09/18/2007 1403   BASOSABS 0.0 08/30/2013 0240   BASOSABS 0.1 09/18/2007 1403    BMET    Component Value Date/Time   NA 146* 09/02/2013 0924   K 3.5 09/02/2013 0924   CL 114* 09/02/2013 0924   CO2 19 09/02/2013 0924   GLUCOSE 117* 09/02/2013 0924   BUN 29* 09/02/2013 0924   CREATININE 1.79* 09/02/2013 0924   CALCIUM 9.3 09/02/2013 0924   GFRNONAA 25* 09/02/2013 0924   GFRAA 29* 09/02/2013 0924    CMP     Component Value Date/Time   NA 146* 09/02/2013 0924   K 3.5 09/02/2013 0924   CL 114* 09/02/2013 0924   CO2 19 09/02/2013 0924   GLUCOSE 117* 09/02/2013 0924   BUN 29* 09/02/2013 0924   CREATININE 1.79* 09/02/2013 0924   CALCIUM 9.3 09/02/2013 0924   PROT 6.2 08/30/2013 0240   ALBUMIN 3.0* 08/30/2013 0240   AST 18 08/30/2013 0240   ALT 11 08/30/2013 0240   ALKPHOS 102 08/30/2013 0240   BILITOT 0.2* 08/30/2013 0240   GFRNONAA 25* 09/02/2013 0924   GFRAA 29* 09/02/2013 0924      Time In Time  Out Total Time Spent with Patient Total Overall Time  0900 1020 70 min 80 min    Greater than 50%  of this time was spent counseling and coordinating care related to the above assessment and plan.  Lorinda Creed NP  Palliative Medicine Team Team Phone # [phone] Pager [phone]   Discussed with Dr Michail Jewels

## 2013-09-02 NOTE — Progress Notes (Signed)
Physical Therapy Treatment Patient Details Name: KRYSTALYNN RIDGEWAY MRN: [id number] DOB: 1926/02/04 Today's Date: 09/02/2013 Time: 7829-5621 PT Time Calculation (min): 27 min  PT Assessment / Plan / Recommendation  History of Present Illness 79 y.o. female admitted to [facility] on 08/29/13 with acute onset aphasia and per H&P left sided weakness.  Also, of note, she also has a UTI.  MRI (+) LT MCA acute infarct    PT Comments   Pt is becoming more and more deconditioned the longer she stays in the hospital.  She is now requiring overall mod assist for mobility. If she can qualify for inpatient rehab she would be an excellent candidate.  Son and daughter in room encouraging pt to work with PT and are very supportive.    Follow Up Recommendations  CIR     Does the patient have the potential to tolerate intense rehabilitation    Yes  Barriers to Discharge   None      Equipment Recommendations  None recommended by PT    Recommendations for Other Services Rehab consult  Frequency Min 4X/week   Progress towards PT Goals Progress towards PT goals: Not progressing toward goals - comment (due to progressive deconditioning. )  Plan Discharge plan needs to be updated    Precautions / Restrictions Precautions Precautions: Fall Precaution Comments: pt walked with hand held assist for all mobility PTA   Pertinent Vitals/Pain See vitals flow sheet.    Mobility  Bed Mobility Supine to Sit: 3: Mod assist;HOB flat Sitting - Scoot to Edge of Bed: 3: Mod assist;With rail Details for Bed Mobility Assistance: mod assist to support trunk to transition to EOB.  She also needed help initiating movement of legs to EOB.  Transfers Transfers: Sit to Stand;Stand to Sit Sit to Stand: 3: Mod assist;With upper extremity assist;From bed Stand to Sit: 3: Mod assist;With upper extremity assist;To bed;To chair/3-in-1 Details for Transfer Assistance: mod assist to support trunk over weakn and stiff legs.  Pt with very  flexed posture upon standing.   Ambulation/Gait Ambulation/Gait Assistance: 3: Mod assist Ambulation Distance (Feet): 40 Feet Assistive device: 1 person hand held assist Ambulation/Gait Assistance Details: mod assist to support trunk by end of walk over weak and flexed knees.  Pt needed constant reminders to look up and stand tall during gait.   Gait Pattern: Step-through pattern;Shuffle;Trunk flexed;Narrow base of support Gait velocity: less than 1.8 ft/sec wich indicates risk for recurrent falls Modified Rankin (Stroke Patients Only) Pre-Morbid Rankin Score: Moderately severe disability Modified Rankin: Moderately severe disability    Exercises General Exercises - Upper Extremity Shoulder Flexion: AAROM;Both;Seated Elbow Flexion: AAROM;Both;15 reps;Seated General Exercises - Lower Extremity Ankle Circles/Pumps: AROM;Both;10 reps;Seated Long Arc Quad: AROM;Both;10 reps;Seated Hip ABduction/ADduction: AROM;Both;10 reps;Seated Hip Flexion/Marching: AROM;Both;10 reps;Seated    PT Goals (current goals can now be found in the care plan section) Acute Rehab PT Goals Patient Stated Goal: to go home, possibly if able to go on their trip to [place] and [place].  Visit Information  Last PT Received On: 09/02/13 Assistance Needed: +1 History of Present Illness: 79 y.o. female admitted to [facility] on 08/29/13 with acute onset aphasia and per H&P left sided weakness.  Also, of note, she also has a UTI.  MRI (+) LT MCA acute infarct     Subjective Data  Subjective: Pt attempting to say some one word answers today. More than on eval Patient Stated Goal: to go home, possibly if able to go on their trip to [place] and  [place].   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 Sitting Balance Static  Sitting - Balance Support: Bilateral upper extremity supported;Feet supported Static Sitting - Level of Assistance: 5: Stand by assistance Static Standing Balance Static Standing - Balance Support: Right upper extremity supported Static Standing - Level of Assistance: 4: Min assist Dynamic Standing Balance Dynamic Standing - Balance Support: Right upper extremity supported Dynamic Standing - Level of Assistance: 3: Mod assist  End of Session PT - End of Session Equipment Utilized During Treatment: Gait belt Activity Tolerance: Patient limited by fatigue Patient left: in chair;with call bell/phone within reach;with family/visitor present    Lurena Joiner B. Wymon Swaney, PT, DPT [phone]   09/02/2013, 5:53 PM

## 2013-09-02 NOTE — Procedures (Signed)
Objective Swallowing Evaluation: Modified Barium Swallowing Study  Patient Details  Name: [name] MRN: [id number] Date of Birth: September 28, 1926  Today's Date: 09/02/2013 Time: 0950-1020 SLP Time Calculation (min): 30 min  Past Medical History:  Past Medical History  Diagnosis Date  . Dementia   . Hypertension   . Ulcer     Hx of  . Stomach cancer   . Snoring   . Fracture, intertrochanteric, left femur 03/25/2012  . Fracture, intertrochanteric, right femur 06/2008    s/p closed reduction and internal fixation  . Nodule of left lung     followed by Dr. Vassie Loll // LLL spiculated nodule noted per CT in 2012 1.5x1.6x2.4, PET in 06/03/2011 was low uptake.   Past Surgical History:  Past Surgical History  Procedure Laterality Date  . Stomach surgery      3/4 of stomach removed due to stomach cancer  . Hip surgery  2009    right  . Total hip arthroplasty  03/29/2012    Procedure: TOTAL HIP ARTHROPLASTY;  Surgeon: Eulas Post, MD;  Location: [facility] OR;  Service: Orthopedics;  Laterality: Left;  Left Hip Hemiarthroplasty   HPI:  [name] is a 79 y.o. female presenting with expressive aphasia and left sided weakness.  PMH is significant for HTN, dementia, gastric ca, PUD, and stroke in 2003  MRI Small/patchy left MCA acute infarct, affecting insula and central white matter.  CT, extensive white matter dz. Per son, pt's dementia is mild.  Conversant PTA.       Assessment / Plan / Recommendation Clinical Impression  Dysphagia Diagnosis: Mild oral phase dysphagia;Moderate pharyngeal phase dysphagia;Severe pharyngeal phase dysphagia Clinical impression: Pt. exhibited mild oral dysphagia with decreased manipulation and delayed transit.  Moderate-severe primarily motor based pharygneal dysphagia exhibited that lead to laryngeal penetration above and to the level of vocal cords without sensation (during and after swallow).  Tongue base retraction and laryngeal elevation was reduced causing maximum  vallecular residue and mod-max pyriform sinus residue.  Volitional swallows were performed 60% of the time with delays after max verbal and visual cues that did not effectively reduce pharyngeal residue.  Currently she is not safe to initiate any po consistency.  Potential for improvement appears good for a modified diet texture and liquids.  Son present for [facility] and understood recommendations of temporary tube feeding (PANDA).  ST will follow for trial of pharyngeal strengthening exercises.       Treatment Recommendation  Therapy as outlined in treatment plan below    Diet Recommendation NPO;Alternative means - temporary   Medication Administration: Via alternative means    Other  Recommendations Oral Care Recommendations: Oral care BID   Follow Up Recommendations  Inpatient Rehab    Frequency and Duration min 2x/week  2 weeks   Pertinent Vitals/Pain none    SLP Swallow Goals Goal #3: Pt. will improve laryngeal elevation and tongue base retraction with SLP visual model for return to po's with mod visual/verbal cues.      Reason for Referral Objectively evaluate swallowing function   Oral Phase Oral Preparation/Oral Phase Oral Phase: Impaired Oral - Honey Oral - Honey Teaspoon: Delayed oral transit Oral - Nectar Oral - Nectar Cup: Delayed oral transit Oral - Solids Oral - Puree: Delayed oral transit   Pharyngeal Phase Pharyngeal Phase Pharyngeal Phase: Impaired Pharyngeal - Honey Pharyngeal - Honey Teaspoon: Pharyngeal residue - valleculae;Pharyngeal residue - pyriform sinuses;Reduced laryngeal elevation;Reduced tongue base retraction;Penetration/Aspiration during swallow;Delayed swallow initiation;Premature spillage to valleculae Penetration/Aspiration  details (honey teaspoon): Material enters airway, remains ABOVE vocal cords and not ejected out Pharyngeal - Nectar Pharyngeal - Nectar Teaspoon: Pharyngeal residue - pyriform sinuses;Pharyngeal residue - valleculae;Reduced  laryngeal elevation;Reduced tongue base retraction;Penetration/Aspiration during swallow;Penetration/Aspiration after swallow Penetration/Aspiration details (nectar teaspoon): Material enters airway, CONTACTS cords and not ejected out Pharyngeal - Nectar Cup: Penetration/Aspiration during swallow;Pharyngeal residue - valleculae;Pharyngeal residue - pyriform sinuses;Reduced tongue base retraction;Reduced laryngeal elevation Penetration/Aspiration details (nectar cup): Material enters airway, remains ABOVE vocal cords and not ejected out;Material enters airway, CONTACTS cords and not ejected out Pharyngeal - Thin Pharyngeal - Thin Teaspoon: Penetration/Aspiration during swallow;Pharyngeal residue - valleculae;Pharyngeal residue - pyriform sinuses;Reduced laryngeal elevation;Reduced tongue base retraction Penetration/Aspiration details (thin teaspoon): Material enters airway, CONTACTS cords and not ejected out Pharyngeal - Solids Pharyngeal - Puree: Pharyngeal residue - valleculae;Pharyngeal residue - pyriform sinuses;Penetration/Aspiration during swallow;Reduced tongue base retraction;Reduced laryngeal elevation;Delayed swallow initiation;Premature spillage to valleculae Penetration/Aspiration details (puree): Material enters airway, remains ABOVE vocal cords then ejected out  Cervical Esophageal Phase    GO    Cervical Esophageal Phase Cervical Esophageal Phase: Impaired Cervical Esophageal Phase - Comment Cervical Esophageal Comment:  (cervical esophageal stasis)         Breck Coons Siham Bucaro M.Ed [employer] [phone]  09/02/2013

## 2013-09-02 NOTE — Consult Note (Signed)
WOC consult Note Reason for Consult: Pressure ulcer to sacrum, present on admission Wound type: Pressure ulcer Pressure Ulcer POA: Yes Measurement: 0.5 cm x 1 cm Wound bed:100% adherent yellow slough. Drainage (amount, consistency, odor) None Periwound: Intact  Dressing procedure/placement/frequency:  Continue Allevyn sacral dressing.  Due to small size of wound and limited intake, do not recommend enzymatic debriding.  Moist wound environment from foam will promote autolysis of slough.  Change every 3 days and PRN soilage.  Will not follow at this time.  Please re-consult if needed.  Maple Hudson RN BSN [facility] Pager [phone]

## 2013-09-02 NOTE — Consult Note (Signed)
Physical Medicine and Rehabilitation Consult  Reason for Consult: Speech difficulties, dysphagia,  Referring Physician:  Dr Leveda Anna   HPI: [name] is a 79 y.o. female with a history of hypertension, dementia, stomach cancer and peptic ulcer disease, presenting with new onset of inability to speak. Patient reportedly was inattentive to those around her and attempted to speak but could not utter any sounds and was aware that she could not speak.  CT scan of her head showed no acute intracranial abnormality. Her ability to speak improved markedly while being evaluated in the emergency room. MRI/MRA brain done revealing small/patchy left MCA infarct and no stenosis. 2D echo with EF 65-70% and grade 1 diastolic dysfunction. 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 Swallow evaluation done and NPO recommended. Was started on cardizem drip for A Fib but developed    Review of Systems  Unable to perform ROS: other  Respiratory: Negative for shortness of breath.   Cardiovascular: Negative for chest pain and palpitations.    Past Medical History  Diagnosis Date  . Dementia   . Hypertension   . Ulcer     Hx of  . Stomach cancer   . Snoring   . Fracture, intertrochanteric, left femur 03/25/2012  . Fracture, intertrochanteric, right femur 06/2008    s/p closed reduction and internal fixation  . Nodule of left lung     followed by Dr. Vassie Loll // LLL spiculated nodule noted per CT in 2012 1.5x1.6x2.4, PET in 06/03/2011 was low uptake.   Past Surgical History  Procedure Laterality Date  . Stomach surgery      3/4 of stomach removed due to stomach cancer  . Hip surgery  2009    right  . Total hip arthroplasty  03/29/2012    Procedure: TOTAL HIP ARTHROPLASTY;  Surgeon: Eulas Post, MD;  Location: [facility] OR;  Service: Orthopedics;  Laterality: Left;  Left Hip Hemiarthroplasty   Family History  Problem Relation Age of Onset  . Cancer Father   . Liver cancer Son   . 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  Lives with son who provides 24 hrs supervision and SBA for all mobility. Son does all housework. Per reports that she quit smoking about 39 years ago. Her smoking use included Cigarettes. She has a .6 pack-year smoking history. She has never used smokeless tobacco. She reports that  drinks alcohol. Her drug history is not on file.   Allergies: No Known Allergies  Medications Prior to Admission  Medication Sig Dispense Refill  . calcitRIOL (ROCALTROL) 0.25 MCG capsule Take 0.25 mcg by mouth daily.      Marland Kitchen donepezil (ARICEPT) 10 MG tablet Take 10 mg by mouth daily.      . ferrous fumarate (HEMOCYTE - 106 MG FE) 325 (106 FE) MG TABS tablet Take 1 tablet by mouth daily.      . furosemide (LASIX) 20 MG tablet Take 20 mg by mouth every morning.      . memantine (NAMENDA) 10 MG tablet Take 10 mg by mouth 2 (two) times daily.       . Multiple Vitamins-Minerals (EQL GUMMY ADULT PO) Take 1 each by mouth daily.        . sodium bicarbonate 650 MG tablet Take 650 mg by mouth 4 (four) times daily.      . vitamin B-12 (CYANOCOBALAMIN) 1000 MCG tablet Take 1,000 mcg by mouth every Monday, Wednesday, and Friday.  Home: Home Living Family/patient expects to be discharged to:: Private residence Living Arrangements: Children (son Nida Boatman ) Available Help at Discharge: Family;Available 24 hours/day Type of Home: House Home Access: Ramped entrance Home Layout: Two level;Able to live on main level with bedroom/bathroom Home Equipment: Dan Humphreys - 2 wheels;Cane - single point;Shower seat  Functional History: Prior Function Comments: Son reports they were suppose to drive to [place] and [employer] for a trip next week.   Functional Status:  Mobility: Bed Mobility Bed Mobility: Not assessed Supine to Sit: 4: Min assist;With rails;HOB flat Sitting - Scoot to Edge of Bed: 4: Min assist;With rail Transfers Transfers: Sit to Stand;Stand to [employer] to Stand: 4:  Min assist;With upper extremity assist;With armrests Stand to Sit: 4: Min assist;With upper extremity assist;With armrests;To chair/3-in-1 Ambulation/Gait Ambulation/Gait Assistance: 4: Min assist Ambulation Distance (Feet): 15 Feet Assistive device: 1 person hand held assist Ambulation/Gait Assistance Details: min assist to stabilize trunk for balance and support over weak legs.  Pt also improved with gait with increased distance.  Son reports that she has broken both hips most recently the right hip.   Gait Pattern: Step-through pattern;Shuffle;Trunk flexed Gait velocity: less than 1.8 ft/sec wich indicates risk for recurrent falls General Gait Details: verbal cues for upright posture    ADL: ADL Grooming: Wash/dry hands;Wash/dry face;Set up Where Assessed - Grooming: Supported sitting Lower Body Dressing: Minimal assistance Where Assessed - Lower Body Dressing: Supported sit to Pharmacist, hospital: Minimal Dentist Method: Sit to Barista: Raised toilet seat with arms (or 3-in-1 over toilet) Equipment Used: Gait belt Transfers/Ambulation Related to ADLs: Pts son assisting during session to demonstrate toilet trasnfer and simulate as close to home as possible. Pt s son giving verbal instructions about posture and support. Son holding forearm out to create a bar like handle for pt to hold. pt holding bil UE on forearm and following son. Pt and son report this is normal ambuation. ADL Comments: Pt following all simple commands, completing a visual assessment demosntrates figure closure. Pt completed a square, cicle and triangle  shape. Pt redrawing a house with example. pt asked to draw a clock and make it say 9 o clock . Pt provided a cicle. Pt drawing lines inside the circle to create 8 pieces. Pt writing out to teh side 9 o clock. Pt provided extra time to review drawing. pt then shown clock in room and asked "does your clock look like mine?" Pt  states yes   Cognition: Cognition Overall Cognitive Status: History of cognitive impairments - at baseline Arousal/Alertness: Awake/alert Orientation Level: Oriented X4;Other (comment) Attention: Focused;Sustained;Selective Focused Attention: Appears intact Sustained Attention: Appears intact Selective Attention: Appears intact Memory: Impaired Memory Impairment: Decreased short term memory;Decreased recall of new information Decreased Short Term Memory: Verbal basic;Functional basic Awareness: Impaired Awareness Impairment: Intellectual impairment;Emergent impairment Problem Solving: Impaired Problem Solving Impairment: Verbal complex;Functional complex Executive Function: Reasoning;Sequencing;Self Monitoring Cognition Arousal/Alertness: Awake/alert Behavior During Therapy: WFL for tasks assessed/performed Overall Cognitive Status: History of cognitive impairments - at baseline Memory:  (responses to questions are 100% correct with son present ) Difficult to assess due to: Impaired communication  Blood pressure 169/64, pulse 69, temperature 98.2 F (36.8 C), temperature source Oral, resp. rate 17, height 5\' 5"  (1.651 m), weight 43.7 kg (96 lb 5.5 oz), SpO2 97.00%.   Physical Exam  Nursing note and vitals reviewed. Constitutional: She is oriented to person, place, and time.  Thin, elderly female. Pleasant and appropriate. Makes  eye contact.   HENT:  Head: Normocephalic and atraumatic.  Eyes: Conjunctivae are normal. Pupils are equal, round, and reactive to light.  Neck: Normal range of motion. Neck supple.  Cardiovascular: Normal rate.   Pulmonary/Chest: Effort normal and breath sounds normal. No respiratory distress. She has no wheezes.  Mouth breather.   Abdominal: Soft. Bowel sounds are normal.  Musculoskeletal: She exhibits no edema.  Neurological: She is alert and oriented to person, place, and time.  Severe dysarthria. Able to follow basic commands without  difficulty. RUE weakness proximally. Word finding deficits with what appears to be apraxia. Able to identify several simple objects. Told me her address. Vocal quality  Skin: Skin is warm and dry.    Results for orders placed during the hospital encounter of 08/29/13 (from the past 24 hour(s))  GLUCOSE, CAPILLARY     Status: None   Collection Time    09/01/13  5:19 PM      Result Value Range   Glucose-Capillary 93  70 - 99 mg/dL   Comment 1 Notify RN    BASIC METABOLIC PANEL     Status: Abnormal   Collection Time    09/01/13  8:40 PM      Result Value Range   Sodium 147 (*) 135 - 145 mEq/L   Potassium 3.7  3.5 - 5.1 mEq/L   Chloride 114 (*) 96 - 112 mEq/L   CO2 15 (*) 19 - 32 mEq/L   Glucose, Bld 87  70 - 99 mg/dL   BUN 29 (*) 6 - 23 mg/dL   Creatinine, Ser 2.95 (*) 0.50 - 1.10 mg/dL   Calcium 9.0  8.4 - 62.1 mg/dL   GFR calc non Af Amer 26 (*) >90 mL/min   GFR calc Af Amer 30 (*) >90 mL/min  TROPONIN I     Status: None   Collection Time    09/01/13  8:40 PM      Result Value Range   Troponin I <0.30  <0.30 ng/mL  GLUCOSE, CAPILLARY     Status: Abnormal   Collection Time    09/01/13 11:33 PM      Result Value Range   Glucose-Capillary 120 (*) 70 - 99 mg/dL   Comment 1 Notify RN    TROPONIN I     Status: None   Collection Time    09/02/13  1:45 AM      Result Value Range   Troponin I <0.30  <0.30 ng/mL  GLUCOSE, CAPILLARY     Status: Abnormal   Collection Time    09/02/13  6:17 AM      Result Value Range   Glucose-Capillary 113 (*) 70 - 99 mg/dL   Comment 1 Notify RN    GLUCOSE, CAPILLARY     Status: Abnormal   Collection Time    09/02/13  7:12 AM      Result Value Range   Glucose-Capillary 112 (*) 70 - 99 mg/dL   Comment 1 Documented in Chart     Comment 2 Notify RN    CBC     Status: Abnormal   Collection Time    09/02/13  9:24 AM      Result Value Range   WBC 7.0  4.0 - 10.5 K/uL   RBC 3.10 (*) 3.87 - 5.11 MIL/uL   Hemoglobin 9.4 (*) 12.0 - 15.0 g/dL    HCT 30.8 (*) 65.7 - 46.0 %   MCV 97.1  78.0 - 100.0 fL   MCH  30.3  26.0 - 34.0 pg   MCHC 31.2  30.0 - 36.0 g/dL   RDW 16.1  09.6 - 04.5 %   Platelets 202  150 - 400 K/uL  BASIC METABOLIC PANEL     Status: Abnormal   Collection Time    09/02/13  9:24 AM      Result Value Range   Sodium 146 (*) 135 - 145 mEq/L   Potassium 3.5  3.5 - 5.1 mEq/L   Chloride 114 (*) 96 - 112 mEq/L   CO2 19  19 - 32 mEq/L   Glucose, Bld 117 (*) 70 - 99 mg/dL   BUN 29 (*) 6 - 23 mg/dL   Creatinine, Ser 4.09 (*) 0.50 - 1.10 mg/dL   Calcium 9.3  8.4 - 81.1 mg/dL   GFR calc non Af Amer 25 (*) >90 mL/min   GFR calc Af Amer 29 (*) >90 mL/min  GLUCOSE, CAPILLARY     Status: Abnormal   Collection Time    09/02/13 11:01 AM      Result Value Range   Glucose-Capillary 124 (*) 70 - 99 mg/dL   Comment 1 Documented in Chart     Comment 2 Notify RN     Dg Swallowing Func-speech Pathology  09/02/2013   Breck Coons [place], CCC-SLP     09/02/2013 10:50 AM Objective Swallowing Evaluation: Modified Barium Swallowing Study   Patient Details  Name: LYNISHA OSUCH MRN: [id number] Date of Birth: 02-16-1926  Today's Date: 09/02/2013 Time: 0950-1020 SLP Time Calculation (min): 30 min  Past Medical History:  Past Medical History  Diagnosis Date  . Dementia   . Hypertension   . Ulcer     Hx of  . Stomach cancer   . Snoring   . Fracture, intertrochanteric, left femur 03/25/2012  . Fracture, intertrochanteric, right femur 06/2008    s/p closed reduction and internal fixation  . Nodule of left lung     followed by Dr. Vassie Loll // LLL spiculated nodule noted per CT in  2012 1.5x1.6x2.4, PET in 06/03/2011 was low uptake.   Past Surgical History:  Past Surgical History  Procedure Laterality Date  . Stomach surgery      3/4 of stomach removed due to stomach cancer  . Hip surgery  2009    right  . Total hip arthroplasty  03/29/2012    Procedure: TOTAL HIP ARTHROPLASTY;  Surgeon: Eulas Post,  MD;  Location: [facility] OR;  Service: Orthopedics;  Laterality: Left;    Left Hip Hemiarthroplasty   HPI:  [name] is a 79 y.o. female presenting with expressive  aphasia and left sided weakness.  PMH is significant for HTN,  dementia, gastric ca, PUD, and stroke in 2003  MRI Small/patchy  left MCA acute infarct, affecting insula and central white  matter.  CT, extensive white matter dz. Per son, pt's dementia is  mild.  Conversant PTA.       Assessment / Plan / Recommendation Clinical Impression  Dysphagia Diagnosis: Mild oral phase dysphagia;Moderate  pharyngeal phase dysphagia;Severe pharyngeal phase dysphagia Clinical impression: Pt. exhibited mild oral dysphagia with  decreased manipulation and delayed transit.  Moderate-severe  primarily motor based pharygneal dysphagia exhibited that lead to  laryngeal penetration above and to the level of vocal cords  without sensation (during and after swallow).  Tongue base  retraction and laryngeal elevation was reduced causing maximum  vallecular residue and mod-max pyriform sinus residue.   Volitional swallows were performed 60% of the  time with delays  after max verbal and visual cues that did not effectively reduce  pharyngeal residue.  Currently she is not safe to initiate any po  consistency.  Potential for improvement appears good for a  modified diet texture and liquids.  Son present for [facility] and  understood recommendations of temporary tube feeding (PANDA).  ST  will follow for trial of pharyngeal strengthening exercises.       Treatment Recommendation  Therapy as outlined in treatment plan below    Diet Recommendation NPO;Alternative means - temporary   Medication Administration: Via alternative means    Other  Recommendations Oral Care Recommendations: Oral care BID   Follow Up Recommendations  Inpatient Rehab    Frequency and Duration min 2x/week  2 weeks   Pertinent Vitals/Pain none    SLP Swallow Goals Goal #3: Pt. will improve laryngeal elevation and tongue base  retraction with SLP visual model for return to po's with mod   visual/verbal cues.      Reason for Referral Objectively evaluate swallowing function   Oral Phase Oral Preparation/Oral Phase Oral Phase: Impaired Oral - Honey Oral - Honey Teaspoon: Delayed oral transit Oral - Nectar Oral - Nectar Cup: Delayed oral transit Oral - Solids Oral - Puree: Delayed oral transit   Pharyngeal Phase Pharyngeal Phase Pharyngeal Phase: Impaired Pharyngeal - Honey Pharyngeal - Honey Teaspoon: Pharyngeal residue -  valleculae;Pharyngeal residue - pyriform sinuses;Reduced  laryngeal elevation;Reduced tongue base  retraction;Penetration/Aspiration during swallow;Delayed swallow  initiation;Premature spillage to valleculae Penetration/Aspiration details (honey teaspoon): Material enters  airway, remains ABOVE vocal cords and not ejected out Pharyngeal - Nectar Pharyngeal - Nectar Teaspoon: Pharyngeal residue - pyriform  sinuses;Pharyngeal residue - valleculae;Reduced laryngeal  elevation;Reduced tongue base retraction;Penetration/Aspiration  during swallow;Penetration/Aspiration after swallow Penetration/Aspiration details (nectar teaspoon): Material enters  airway, CONTACTS cords and not ejected out Pharyngeal - Nectar Cup: Penetration/Aspiration during  swallow;Pharyngeal residue - valleculae;Pharyngeal residue -  pyriform sinuses;Reduced tongue base retraction;Reduced laryngeal  elevation Penetration/Aspiration details (nectar cup): Material enters  airway, remains ABOVE vocal cords and not ejected out;Material  enters airway, CONTACTS cords and not ejected out Pharyngeal - Thin Pharyngeal - Thin Teaspoon: Penetration/Aspiration during  swallow;Pharyngeal residue - valleculae;Pharyngeal residue -  pyriform sinuses;Reduced laryngeal elevation;Reduced tongue base  retraction Penetration/Aspiration details (thin teaspoon): Material enters  airway, CONTACTS cords and not ejected out Pharyngeal - Solids Pharyngeal - Puree: Pharyngeal residue - valleculae;Pharyngeal  residue - pyriform  sinuses;Penetration/Aspiration during  swallow;Reduced tongue base retraction;Reduced laryngeal  elevation;Delayed swallow initiation;Premature spillage to  valleculae Penetration/Aspiration details (puree): Material enters airway,  remains ABOVE vocal cords then ejected out  Cervical Esophageal Phase    GO    Cervical Esophageal Phase Cervical Esophageal Phase: Impaired Cervical Esophageal Phase - Comment Cervical Esophageal Comment:  (cervical esophageal stasis)         Breck Coons Lit[place] M.Ed CCC-SLP Pager [phone]  09/02/2013    Assessment/Plan: Diagnosis: left MCA infarct 1. Does the need for close, 24 hr/day medical supervision in concert with the patient's rehab needs make it unreasonable for this patient to be served in a less intensive setting? Yes 2. Co-Morbidities requiring supervision/potential complications: htn, afib, dementia 3. Due to bladder management, bowel management, safety, skin/wound care, disease management, medication administration, pain management and patient education, does the patient require 24 hr/day rehab nursing? Yes 4. Does the patient require coordinated care of a physician, rehab nurse, PT (1-2 hrs/day, 5 days/week), OT (1-2 hrs/day, 5 days/week) and SLP (1-2 hrs/day, 5 days/week) to  address physical and functional deficits in the context of the above medical diagnosis(es)? Yes Addressing deficits in the following areas: balance, endurance, locomotion, strength, transferring, bowel/bladder control, bathing, dressing, feeding, grooming, toileting, cognition, speech, language, swallowing and psychosocial support 5. Can the patient actively participate in an intensive therapy program of at least 3 hrs of therapy per day at least 5 days per week? Yes 6. The potential for patient to make measurable gains while on inpatient rehab is excellent 7. Anticipated functional outcomes upon discharge from inpatient rehab are supervision to mod I with PT, supervision to mod I with OT,  supervision to minimal+ assist with SLP. 8. Estimated rehab length of stay to reach the above functional goals is: 7-10 days 9. Does the patient have adequate social supports to accommodate these discharge functional goals? Yes 10. Anticipated D/C setting: Home 11. Anticipated post D/C treatments: HH therapy 12. Overall Rehab/Functional Prognosis: excellent  RECOMMENDATIONS: This patient's condition is appropriate for continued rehabilitative care in the following setting: CIR Patient has agreed to participate in recommended program. Yes Note that insurance prior authorization may be required for reimbursement for recommended care.  Comment: Rehab RN to follow up. NG tube being inserted today. Swallowing issues may be rate limiting factor for dc to home.   Ranelle Oyster, MD, [place] [facility]     09/02/2013

## 2013-09-02 NOTE — Progress Notes (Signed)
Family Medicine Teaching Service Daily Progress Note Intern Pager: [phone]  Patient name: [name] Medical record number: [id number] Date of birth: Feb 22, 1926 Age: 79 y.o. Gender: female  Primary Care Provider: Elvina Sidle, MD Consultants: Neuro Code Status: Full  Pt Overview and Major Events to Date:  9/19:  - MRI/MRA left MCA branch stroke  - Afib with RVR, transfer to stepdown and start dilt drip  9/20: Dilt drip held given normalized rate this AM 9/21: 6:30 AM pt went back into Afib with RVR 130s. Restarted dilt drip.   Assessment and Plan: [name] is a 79 y.o. female presenting with expressive aphasia and left sided weakness that started this afternoon. PMH is significant for HTN, dementia, gastric ca, PUD, and stroke in 2003 (takes ASA 81).   #Neuro: Stroke-small left MCA acute infarct on MRI/MRA. Aphasic and some right sided weakness. - Clinical sx consistent with acute stroke with severe aphasia, not much improved. Carotid doppler 1-39% stenosis bilaterally. - PT/OT/Speech recommending HH and NPO - Troponin neg x 3, mildly pos x 1 to 0.31, neg x 3 yesterday - Discuss recs with neuro re: TEE, having to hold plavix while NPO, and HH vs SNF and need GOC discussion with family that we initiated over weekend - Planned modified barium swallow today, if fails then likely will need NG/PEG  #Atrial fibrillation with RVR - Patient presented with first instance of Afib with RVR 2 days ago, resolved yesterday morning on dilt drip so d/c'ed. However, 6:30 AM yesterday went back into Afib with RVR - Diltiazem drip stopped yesterday - Given 2.5mg  metoprolol IV x 1 yesterday - Has asa 300mg  suppository; Needs plavix - pending decision re: swallow study again or place NG today; hesitate to anticoagulate given fall risk with dementia - Repeat troponins yesterday neg x 3 - Appreciate cardiology recs: suggest amiodarone 200mg  BID, preferably orals. - Check TSH  #Metabolic  acidosis: - Bmet last night Bicarb 15, gap 18.  - On D5+1/2NS 50cc/hr.  # Dementia: Has outpatient neurologist. TIA 2003 and pt was on aspirin 81mg . - Mild dementia, taking namenda and aricept daily though currently being held while NPO per speech. - Restart when able or consider alternatives  #GU: S/p 3 days ceftriaxone IV for suspected UTI vs non-clean catch - Creatinine baseline yesterday   #H/o HTN - Currently only prescribed lasix for leg edema, per family. BP contiues to range from 120s-180s systolic. - Permissive HTN in setting of acute infarction with >200/100 to call FPTS for low dose hydralazine  #Left hip tenderness - Recent h/o hip fx surgery but no subsequent fever, skin breakdown, or fall.  - Monitor, possibly due to prolonged lying  #Coccygeal ulcer: Decubitus pressure ulcer present on admission. Nursing placing padded dressings over sacrum - WOC recs appreciated: Continue allevyn sacral dressing, change every 3 days and PRN for soilage.   FEN/GI: NPO per speech s/p MCA stroke; Family is considering NG tube for meds/feeds - Currently underweight (BMI 16.9).  PPx: SQ heparin  Disposition: Pending clinical course; PT/OT recommend home health services upon discharge; CM consulted and [facility] has been contacted - Outpatient PET scan with Dr Vassie Loll for lung nodules  Subjective:  Pt sleeping but easily awoken. No apparent complaints, but still not speaking; denies CP, SOB.  Objective: Temp:  [97.5 F (36.4 C)-99 F (37.2 C)] 97.9 F (36.6 C) (09/22 0453) Pulse Rate:  [49-141] 69 (09/22 0453) Resp:  [13-23] 17 (09/22 0453) BP: (115-157)/(49-89) 155/58 mmHg (09/22 0453) SpO2:  [  93 %-98 %] 97 % (09/22 0453) Physical Exam: General: NAD, lying in bed, does not speak Cardiovascular: RRR, normal heart sounds, no murmurs Respiratory: CTAB, normal effort Abdomen: soft, nontender, nondistended, bowel sounds present Extremities: no LLE, no calf tenderness Neuro: No speech still,  smiles, interacts appropriately, follows commands  Laboratory:  Recent Labs Lab 08/29/13 1357 08/29/13 1412 08/29/13 2054 08/30/13 0240  WBC 6.7  --  7.2 10.4  HGB 11.5* 12.6 10.8* 11.3*  HCT 36.3 37.0 32.6* 34.1*  PLT 243  --  202 215    Recent Labs Lab 08/29/13 1357 08/29/13 1412 08/29/13 2054 08/30/13 0240 09/01/13 2040  NA 137 141  --  141 147*  K 3.8 3.9  --  3.7 3.7  CL 100 107  --  107 114*  CO2 24  --   --  20 15*  BUN 41* 40*  --  37* 29*  CREATININE 1.73* 1.80* 1.61* 1.60* 1.74*  CALCIUM 9.1  --   --  8.3* 9.0  PROT 6.9  --   --  6.2  --   BILITOT 0.3  --   --  0.2*  --   ALKPHOS 115  --   --  102  --   ALT 12  --   --  11  --   AST 22  --   --  18  --   GLUCOSE 109* 95  --  104* 87      Recent Labs Lab 08/31/13 0900 08/31/13 1725 09/01/13 1131 09/01/13 2040 09/02/13 0145  TROPONINI 0.31* <0.30 <0.30 <0.30 <0.30     Imaging/Diagnostic Tests: None new  Tawni Carnes, MD 09/02/2013, 6:54 AM PGY-1, [facility] FPTS Intern pager: [phone], text pages welcome

## 2013-09-03 ENCOUNTER — Inpatient Hospital Stay (HOSPITAL_COMMUNITY): Payer: Medicare Other

## 2013-09-03 DIAGNOSIS — R471 Dysarthria and anarthria: Secondary | ICD-10-CM

## 2013-09-03 DIAGNOSIS — R1310 Dysphagia, unspecified: Secondary | ICD-10-CM

## 2013-09-03 LAB — GLUCOSE, CAPILLARY
Glucose-Capillary: 123 mg/dL — ABNORMAL HIGH (ref 70–99)
Glucose-Capillary: 109 mg/dL — ABNORMAL HIGH (ref 70–99)

## 2013-09-03 MED ORDER — IOHEXOL 300 MG/ML  SOLN
50.0000 mL | Freq: Once | INTRAMUSCULAR | Status: AC | PRN
  Administered 2013-09-03: 40 mL via ORAL

## 2013-09-03 NOTE — Progress Notes (Signed)
Family Medicine Teaching Service Daily Progress Note Intern Pager: [phone]  Patient name: [name] Medical record number: [id number] Date of birth: 07-11-1926 Age: 79 y.o. Gender: female  Primary Care Provider: Elvina Sidle, MD Consultants: Neuro Code Status: Full  Pt Overview and Major Events to Date:  9/19:  - MRI/MRA left MCA branch stroke  - Afib with RVR, transfer to stepdown and start dilt drip  9/20: Dilt drip held given normalized rate this AM 9/21: 6:30 AM pt went back into Afib with RVR 130s. Restarted dilt drip.   Assessment and Plan: CHARLYN VIALPANDO is a 79 y.o. female presenting with expressive aphasia and left sided weakness that started this afternoon. PMH is significant for HTN, dementia, gastric ca, PUD, and stroke in 2003 (takes ASA 81).   #Neuro: Stroke-small left MCA acute infarct on MRI/MRA. Aphasic and some right sided weakness. - Clinical sx consistent with acute stroke with severe aphasia, not much improved. Carotid doppler 1-39% stenosis bilaterally. - PT/OT/Speech recommending HH and NPO - Troponin neg x 3, mildly pos x 1 to 0.31, neg x 3 yesterday - Discuss recs with neuro re: TEE, having to hold plavix while NPO, and HH vs SNF and need GOC discussion with family that we initiated over weekend - Failed modified barium swallow 9/22, needs NG/OG/PEG; will discuss today  #Atrial fibrillation with RVR - Patient presented with first instance of Afib with RVR 2 days ago, resolved yesterday morning on dilt drip so d/c'ed. However, 6:30 AM yesterday went back into Afib with RVR - Diltiazem drip stopped yesterday - Given 2.5mg  metoprolol IV x 1 yesterday - Has asa 300mg  suppository; Needs plavix - pending decision re: swallow study again or place NG today; hesitate to anticoagulate given fall risk with dementia - Repeat troponins yesterday neg x 3 - Appreciate cardiology recs: suggest amiodarone 200mg  BID, preferably orals. - TSH wnl - CBC 9/22: anemia Hgb 9.4  (9/22) < 11.3 (9/19)  #Metabolic acidosis: - Bmet 9/22:  Bicarb 19, gap 13.  - On D5+1/2NS 50cc/hr.  # Dementia: Has outpatient neurologist. TIA 2003 and pt was on aspirin 81mg . - Mild dementia, taking namenda and aricept daily though currently being held while NPO per speech. - Restart when able or consider alternatives  #GU: S/p 3 days ceftriaxone IV for suspected UTI vs non-clean catch - Creatinine 1.79 (9/22)  #H/o HTN - Currently only prescribed lasix for leg edema, per family. BP contiues to range from 120s-180s systolic. - Permissive HTN in setting of acute infarction with >200/100 to call FPTS for low dose hydralazine  #Left hip tenderness - Recent h/o hip fx surgery but no subsequent fever, skin breakdown, or fall.  - Monitor, possibly due to prolonged lying  #Coccygeal ulcer: Decubitus pressure ulcer present on admission. Nursing placing padded dressings over sacrum - WOC recs appreciated: Continue allevyn sacral dressing, change every 3 days and PRN for soilage.   FEN/GI: NPO per speech s/p MCA stroke; Family is considering NG tube for meds/feeds - Fluids: D5 1/2 NS @ 50 mL/hr (K 3.5 on 9/22) - Currently underweight (BMI 16.9).  PPx: SQ heparin  Disposition: Pending clinical course; PT/OT recommend home health services upon discharge; CM consulted and [facility] has been contacted - Outpatient PET scan with Dr Vassie Loll for lung nodules  Subjective: Pt sleeping but easily awoken. No apparent complaints, but still not speaking; denies CP, SOB.  Objective: Temp:  [97.3 F (36.3 C)-98.7 F (37.1 C)] 97.9 F (36.6 C) (09/23 0505) Pulse Rate:  [  56-65] 65 (09/23 0505) Resp:  [15-23] 16 (09/23 0505) BP: (165-188)/(60-75) 175/68 mmHg (09/23 0505) SpO2:  [96 %-98 %] 96 % (09/23 0505) Weight:  [97 lb 10.6 oz (44.3 kg)] 97 lb 10.6 oz (44.3 kg) (09/23 0505) Physical Exam: General: NAD, lying in bed, does not speak Cardiovascular: RRR, normal heart sounds, no murmurs Respiratory: CTAB,  normal effort Abdomen: soft, nontender, nondistended,  Extremities: no LLE, no calf tenderness Neuro: No speech still, smiles, interacts appropriately, follows commands  Laboratory:  Recent Labs Lab 08/29/13 2054 08/30/13 0240 09/02/13 0924  WBC 7.2 10.4 7.0  HGB 10.8* 11.3* 9.4*  HCT 32.6* 34.1* 30.1*  PLT 202 215 202    Recent Labs Lab 08/29/13 1357  08/30/13 0240 09/01/13 2040 09/02/13 0924  NA 137  < > 141 147* 146*  K 3.8  < > 3.7 3.7 3.5  CL 100  < > 107 114* 114*  CO2 24  --  20 15* 19  BUN 41*  < > 37* 29* 29*  CREATININE 1.73*  < > 1.60* 1.74* 1.79*  CALCIUM 9.1  --  8.3* 9.0 9.3  PROT 6.9  --  6.2  --   --   BILITOT 0.3  --  0.2*  --   --   ALKPHOS 115  --  102  --   --   ALT 12  --  11  --   --   AST 22  --  18  --   --   GLUCOSE 109*  < > 104* 87 117*  < > = values in this interval not displayed.    Recent Labs Lab 08/31/13 0900 08/31/13 1725 09/01/13 1131 09/01/13 2040 09/02/13 0145  TROPONINI 0.31* <0.30 <0.30 <0.30 <0.30     Imaging/Diagnostic Tests: None new  Wenda Low, MD 09/03/2013, 7:19 AM PGY-1, [facility] FPTS Intern pager: [phone], text pages welcome

## 2013-09-03 NOTE — Progress Notes (Signed)
Speech Language Pathology Dysphagia and Motor Speech Treatment Patient Details Name: KIMANI BEDOYA MRN: [id number] DOB: 03/13/1926 Today's Date: 09/03/2013 Time: 1100-1135 SLP Time Calculation (min): 35 min  Assessment / Plan / Recommendation Clinical Impression  Treatment focused on dysphagia and facilitation of communication (verbal and gestural).  SLP performed oral care to remove brown mucous from hard/soft palate and posterior tongue.  She required maximum verbal/visual/tactile cues for pharyngeal strengthening exercises concentrating on laryngeal elevation, pharyngeal contraction, vocal cord adduction and attempts at tongue base strengthening. Single ice chips were used to assist in elicitation of swallow which were initiated with moderate-severe delays.  Volitional throat clear and coughs were weak and at times incomplete.  No reflexive throat clear/cough/wet vocal quality exhibited.  Pt.'s son present during session and SLP provided him and pt. with written copy of swallow exercises and aksed to practice daily.  Communication intervention included automatic verbal sequences with verbal cue for first word.  Expression included moderate articulatory inaccuracies.  Encouraged pt. to utilize gestures if unable to verbalize.  She accurately pointed to described picutures on communication board with 100% given occasional prompts.  SLP recommends pt. receive NGT and not PEG at this time.  SLP would like to continue working with pt. in hopes of returning to po's on acute care or on inpatient rehab.       Diet Recommendation  Continue with Current Diet: NPO    SLP Plan Continue with current plan of care   Pertinent Vitals/Pain No indications   Swallowing Goals  SLP Swallowing Goals Patient will utilize recommended strategies during swallow to increase swallowing safety with: Minimal assistance Goal #3: Pt. will improve laryngeal elevation and tongue base retraction with SLP visual model for return to  po's with mod visual/verbal cues. Swallow Study Goal #3 - Progress: Progressing toward goal  General Temperature Spikes Noted: No Respiratory Status: Room air Behavior/Cognition: Alert;Cooperative;Pleasant mood;Requires cueing Oral Cavity - Dentition: Adequate natural dentition Patient Positioning: Upright in bed  Oral Cavity - Oral Hygiene Does patient have any of the following "at risk" factors?: Saliva - thick, dry mouth (brownish secretions hard/soft palate and posterior tongue) Brush patient's teeth BID with toothbrush (using toothpaste with fluoride): Yes Patient is HIGH RISK - Oral Care Protocol followed (see row info): Yes Patient is AT RISK - Oral Care Protocol followed (see row info): Yes   Dysphagia Treatment Treatment focused on: Patient/family/caregiver education (pharyngeal exercises ) Family/Caregiver Educated: son Treatment Methods/Modalities: Effortful swallow;Masako Maneuver (laryngeal elevation) Patient observed directly with PO's: Yes Type of PO's observed: Ice chips Feeding: Total assist Liquids provided via: Teaspoon Oral Phase Signs & Symptoms: Prolonged oral phase Type of cueing: Verbal;Tactile;Visual Amount of cueing: Maximal   GO     Breck Coons Savva Beamer M.Ed [employer] [phone]  09/03/2013

## 2013-09-03 NOTE — Progress Notes (Signed)
Stroke Team Progress Note  HISTORY  [name] is an 79 y.o. female with a history of hypertension, dementia, stomach cancer and peptic ulcer disease, presenting with new onset of inability to speak. Patient reportedly was inattentive to those around her and attempted to speak but could not utter any sounds and was aware that she could not speak. She was last seen well at noon today. His previous history of stroke in 2003. She's been on aspirin 81 mg per day. CT scan of her head showed no acute intracranial abnormality. Her ability to speak improved markedly while being evaluated in the emergency room. She had mild residual dysarthria. She also did not remember her correct age. NIH stroke score was 2. Patient had foul-smelling urine and was hypothermic with a rectal temperature of 96.9. She's being admitted for TIA workup as well as for possible urosepsis.   LSN: Noon on 08/29/2013  tPA Given: No: Rapidly resolving deficits.  MRankin: 2   She was admitted for further evaluation and treatment.  SUBJECTIVE Had some PAF. Palliative care involved.  OBJECTIVE Most recent Vital Signs: Filed Vitals:   09/02/13 2100 09/03/13 0055 09/03/13 0505 09/03/13 0800  BP:  178/75 175/68 174/68  Pulse:  65 65   Temp: 97.4 F (36.3 C) 97.3 F (36.3 C) 97.9 F (36.6 C) 97.6 F (36.4 C)  TempSrc: Oral Oral Oral Oral  Resp:  23 16   Height:      Weight:   44.3 kg (97 lb 10.6 oz)   SpO2:  97% 96%    CBG (last 3)   Recent Labs  09/02/13 1101 09/03/13 0036 09/03/13 0505  GLUCAP 124* 137* 123*    IV Fluid Intake:   . dextrose 5 % and 0.45% NaCl 50 mL/hr at 09/02/13 1900  . feeding supplement (JEVITY 1.2 CAL)      MEDICATIONS  . amiodarone  200 mg Oral BID  . aspirin  300 mg Rectal Daily  . atorvastatin  40 mg Oral q1800  . clopidogrel  75 mg Oral Q breakfast  . heparin  5,000 Units Subcutaneous Q8H   PRN:  acetaminophen, acetaminophen, hydrALAZINE, senna-docusate  Diet:   NPO==jevity Activity:  Bedrest DVT Prophylaxis:  Compression devices  CLINICALLY SIGNIFICANT STUDIES Basic Metabolic Panel:   Recent Labs Lab 09/01/13 2040 09/02/13 0924  NA 147* 146*  K 3.7 3.5  CL 114* 114*  CO2 15* 19  GLUCOSE 87 117*  BUN 29* 29*  CREATININE 1.74* 1.79*  CALCIUM 9.0 9.3   Liver Function Tests:   Recent Labs Lab 08/29/13 1357 08/30/13 0240  AST 22 18  ALT 12 11  ALKPHOS 115 102  BILITOT 0.3 0.2*  PROT 6.9 6.2  ALBUMIN 3.1* 3.0*   CBC:  Recent Labs Lab 08/29/13 1357  08/30/13 0240 09/02/13 0924  WBC 6.7  < > 10.4 7.0  NEUTROABS 4.4  --  8.8*  --   HGB 11.5*  < > 11.3* 9.4*  HCT 36.3  < > 34.1* 30.1*  MCV 96.0  < > 94.7 97.1  PLT 243  < > 215 202  < > = values in this interval not displayed. Coagulation:   Recent Labs Lab 08/29/13 1357 08/30/13 0240  LABPROT 12.0 12.9  INR 0.90 0.99   Cardiac Enzymes:   Recent Labs Lab 09/01/13 1131 09/01/13 2040 09/02/13 0145  TROPONINI <0.30 <0.30 <0.30   Urinalysis:   Recent Labs Lab 08/29/13 1439  COLORURINE YELLOW  LABSPEC 1.010  PHURINE 8.0  GLUCOSEU NEGATIVE  HGBUR MODERATE*  BILIRUBINUR NEGATIVE  KETONESUR NEGATIVE  PROTEINUR 100*  UROBILINOGEN 0.2  NITRITE NEGATIVE  LEUKOCYTESUR LARGE*   Lipid Panel    Component Value Date/Time   CHOL 215* 08/30/2013 0240   TRIG 85 08/30/2013 0240   HDL 87 08/30/2013 0240   CHOLHDL 2.5 08/30/2013 0240   VLDL 17 08/30/2013 0240   LDLCALC 111* 08/30/2013 0240   HgbA1C  Lab Results  Component Value Date   HGBA1C 5.9* 08/30/2013    Urine Drug Screen:     Component Value Date/Time   LABOPIA NONE DETECTED 08/29/2013 1439   COCAINSCRNUR NONE DETECTED 08/29/2013 1439   LABBENZ NONE DETECTED 08/29/2013 1439   AMPHETMU NONE DETECTED 08/29/2013 1439   THCU NONE DETECTED 08/29/2013 1439   LABBARB NONE DETECTED 08/29/2013 1439    Alcohol Level:   Recent Labs Lab 08/29/13 1357  ETH <11    Dg Chest 2 View 08/30/2013   1. No evidence of  acute cardiopulmonary disease. 2. Chronic cardiomegaly without failure.     Ct Head Wo Contrast 08/29/2013    Atrophy with fairly extensive small vessel disease. No demonstrable mass, hemorrhage, or acute appearing infarct.   Mr Brain Wo Contrast 08/30/2013     1. Small/patchy left MCA acute infarct. No mass effect or hemorrhage.  2. Otherwise stable MRI appearance of the brain since 2009.  3. Increased chronic left mastoid effusion. Mild paranasal sinus inflammation.   Mr Maxine Glenn Head/brain Wo Cm 08/30/2013    No major left MCA branch occlusion or stenosis. Negative for age intracranial MRA.    2D Echocardiogram  EF 70%, wall motion normal, LA size normal  Carotid Doppler  Findings consistent with 1-39 percent stenosis involving the right internal carotid artery and the left internal carotid artery  EKG  normal sinus rhythm.   Therapy Recommendations home health  Physical Exam   Frail elderly caucasian lady in bed.Awake alert. Afebrile. Head is nontraumatic. Neck is supple without bruit. Hearing is normal. Cardiac exam no murmur or gallop. Lungs are clear to auscultation. Distal pulses are well felt. Neurological Exam ; awake alertc. Non-verbal. Follows simple commands (close eyes, no 2 step commands). Follows occasional midline gestures. She is able to visually track in either directions. She blinks to threat bilaterally. No facial weakness. Tongue is midline. Motor system exam reveals no upper or lower extremity drift. Symmetric and equal strength in all 4 extremities though her cooperation is limited. No focal weakness. Sensation and coordination cannot be reliably tested. Gait was not tested. ASSESSMENT [name] is a 79 y.o. female presenting with aphasia. Imaging confirms a left MCA infarct. Infarct felt to be secondary to small vessel disease.  On no antithrombotics prior to admission. Now on ASA 300mg  daily for secondary stroke prevention. Patient with resultant aphasia.     Dementia  Hypertension  Stomach cancer  Peptic ulcer disease  Hyperlipidemia, LDL 111, goal < 100 in non diabetics, < 70 in diabetic patients, statin added  Anemia  A fib with RVR  Renal insufficiency  Abnormal urine, on antibiotics-completed.  Long term medication use  Hospital day # 5  TREATMENT/PLAN  Continue Aspirin 300mg  PR plus Plavix for secondary stroke prevention.   Due to dementia, fall risk, patient is likely poor candidate for anticoagulation related to A fib.   Would hold off on TEE at this time, can consider as outpatient; however, suspect this would not change treatment  CIR, However, palliative care consulted by primary  team as well  Failed speech, remains NPO, discuss NG with family if NPO prolonged course; however could ultimately need PEG.  Stroke service will sign off.  Gwendolyn Lima. Manson Passey, [facility], MBA, MHA [facility] Pager: [phone] 09/03/2013 8:40 AM  I have personally  , evaluated imaging results, and formulated the assessment and plan of care. I agree with the above.  Delia Heady, MD

## 2013-09-03 NOTE — Progress Notes (Signed)
Physical Therapy Treatment Patient Details Name: [name] MRN: [id number] DOB: July 08, 1926 Today's Date: 09/03/2013 Time: 1129-1208 PT Time Calculation (min): 39 min  PT Assessment / Plan / Recommendation  History of Present Illness 79 y.o. female admitted to [facility] on 08/29/13 with acute onset aphasia and per H&P left sided weakness.  Also, of note, she also has a UTI.  MRI (+) LT MCA acute infarct    PT Comments   Pt much improved from mobility stand point this date. Pt remains to have expressive aphasia limiting ability to communicate. Remains appropriate for CIR to return to PLOF for safe d/c home with son.   Follow Up Recommendations  CIR     Does the patient have the potential to tolerate intense rehabilitation     Barriers to Discharge        Equipment Recommendations       Recommendations for Other Services    Frequency Min 4X/week   Progress towards PT Goals Progress towards PT goals: Progressing toward goals  Plan      Precautions / Restrictions Precautions Precautions: Fall Precaution Comments: pt with both urinary and bowel incontinence Restrictions Weight Bearing Restrictions: No   Pertinent Vitals/Pain Denies pain    Mobility  Bed Mobility Bed Mobility: Supine to Sit Supine to Sit: 3: Mod assist;HOB flat Sitting - Scoot to Edge of Bed: 3: Mod assist;With rail Details for Bed Mobility Assistance: max directional verbal and tactile cues to use R UE to pull on L bed rail. assist for sequencing and trunk elevation Transfers Transfers: Sit to Stand;Stand to Sit Sit to Stand: 4: Min assist;With upper extremity assist;From bed Stand to Sit: 4: Min assist;With upper extremity assist;To chair/3-in-1 Details for Transfer Assistance: pt with episode of urinary and bowel incontinence. Pt assist to [facility] and dependent for hygiene. Ambulation/Gait Ambulation/Gait Assistance: 4: Min assist Ambulation Distance (Feet): 150 Feet Assistive device: Rolling  walker Ambulation/Gait Assistance Details: minA to facilitate bilat hip extension and amb inside walker instead of pushing walker far infront of pt Gait Pattern: Step-through pattern;Shuffle;Trunk flexed;Narrow base of support Gait velocity: slow General Gait Details: 1 standing rest break, max encouragement to finished amb to room Stairs: No Modified Rankin (Stroke Patients Only) Pre-Morbid Rankin Score: Moderately severe disability Modified Rankin: Moderately severe disability    Exercises     PT Diagnosis:    PT Problem List:   PT Treatment Interventions:     PT Goals (current goals can now be found in the care plan section)    Visit Information  Last PT Received On: 09/03/13 Assistance Needed: +1 History of Present Illness: 79 y.o. female admitted to [facility] on 08/29/13 with acute onset aphasia and per H&P left sided weakness.  Also, of note, she also has a UTI.  MRI (+) LT MCA acute infarct     Subjective Data  Subjective: Pt agreeable to amb   Cognition  Cognition Arousal/Alertness: Awake/alert Behavior During Therapy: WFL for tasks assessed/performed    Balance  Static Standing Balance Static Standing - Balance Support: Bilateral upper extremity supported Static Standing - Level of Assistance: 4: Min assist  End of Session PT - End of Session Equipment Utilized During Treatment: Gait belt Activity Tolerance: Patient limited by fatigue Patient left: in chair;with call bell/phone within reach;with family/visitor present Nurse Communication: Mobility status   GP     Marcene Brawn 09/03/2013, 12:33 PM  Lewis Shock, PT, DPT Pager #: [phone] Office #: [phone]

## 2013-09-03 NOTE — Progress Notes (Addendum)
  This NP returned to patient's room this morning for visit with patient and son Virgel Paling  for follow up conversation to yesterday's goals of care meeting.  Mr Luz immediately turned his back and began raising his voice "get out, get out".  He then faced me while punching his fist into his opposite hand, and in a loud voice said "I don't want to talk to you people, my mother is going to live".  "I don't want to talk about dark things like dying".  I attempted to comfort the patient and verblized support to Mr Mauck and honored his requet to leave the room.  I notified Dr Wenda Low with Family Medicine of occurrence   PMT will sign off at this time.  Please call if we can be of any assistance   Lorinda Creed NP  Palliative Medicine Team Team Phone # [phone] Pager [phone]

## 2013-09-03 NOTE — Progress Notes (Signed)
Rehab Admissions Coordinator Note:  Patient was screened by Trish Mage for appropriateness for an Inpatient Acute Rehab Consult.  At this time, Dr. Riley Kill has completed an inpatient rehab consult.  I will follow this patient.  Trish Mage 09/03/2013, 12:17 PM  I can be reached at [phone].

## 2013-09-03 NOTE — Progress Notes (Signed)
SUBJECTIVE: The patient is doing well today.  At this time, she denies chest pain, shortness of breath, or any new concerns.  She has difficulty with verbal communication due to dementia.  Denies dizziness, presyncope or syncope.   Marland Kitchen amiodarone  200 mg Oral BID  . aspirin  300 mg Rectal Daily  . atorvastatin  40 mg Oral q1800  . clopidogrel  75 mg Oral Q breakfast  . heparin  5,000 Units Subcutaneous Q8H   . dextrose 5 % and 0.45% NaCl 50 mL/hr at 09/02/13 1900  . feeding supplement (JEVITY 1.2 CAL)      OBJECTIVE: Physical Exam: Filed Vitals:   09/03/13 0505 09/03/13 0800 09/03/13 1118 09/03/13 1200  BP: 175/68 174/68  151/82  Pulse: 65     Temp: 97.9 F (36.6 C) 97.6 F (36.4 C) 97.7 F (36.5 C) 97.7 F (36.5 C)  TempSrc: Oral Oral Oral Oral  Resp: 16     Height:      Weight: 97 lb 10.6 oz (44.3 kg)     SpO2: 96%   97%    Intake/Output Summary (Last 24 hours) at 09/03/13 1625 Last data filed at 09/03/13 1600  Gross per 24 hour  Intake   1200 ml  Output      0 ml  Net   1200 ml    Telemetry reveals sinus rhythm today  GEN- The patient is elderly and frail appearing, sleeping but rouses Head- normocephalic, atraumatic Eyes-  Sclera clear, conjunctiva pink Ears- hearing intact Oropharynx- clear Neck- supple,  Lungs- Clear to ausculation bilaterally, normal work of breathing Heart- Regular rate and rhythm  GI- soft, NT, ND, + BS gallop Extremities- no clubbing, cyanosis, or edema Skin- no rash or lesion  LABS: Basic Metabolic Panel:  Recent Labs  16/10/96 2040 09/02/13 0924  NA 147* 146*  K 3.7 3.5  CL 114* 114*  CO2 15* 19  GLUCOSE 87 117*  BUN 29* 29*  CREATININE 1.74* 1.79*  CALCIUM 9.0 9.3   Liver Function Tests: No results found for this basename: AST, ALT, ALKPHOS, BILITOT, PROT, ALBUMIN,  in the last 72 hours No results found for this basename: LIPASE, AMYLASE,  in the last 72 hours CBC:  Recent Labs  09/02/13 0924  WBC 7.0  HGB  9.4*  HCT 30.1*  MCV 97.1  PLT 202   Cardiac Enzymes:  Recent Labs  09/01/13 1131 09/01/13 2040 09/02/13 0145  TROPONINI <0.30 <0.30 <0.30   BNP: No components found with this basename: POCBNP,  D-Dimer: No results found for this basename: DDIMER,  in the last 72 hours Hemoglobin A1C: No results found for this basename: HGBA1C,  in the last 72 hours Fasting Lipid Panel: No results found for this basename: CHOL, HDL, LDLCALC, TRIG, CHOLHDL, LDLDIRECT,  in the last 72 hours Thyroid Function Tests:  Recent Labs  09/02/13 1215  TSH 1.446   Anemia Panel: No results found for this basename: VITAMINB12, FOLATE, FERRITIN, TIBC, IRON, RETICCTPCT,  in the last 72 hours  RADIOLOGY: Dg Chest 2 View  08/30/2013   CLINICAL DATA:  Stroke and dementia.  EXAM: CHEST  2 VIEW  COMPARISON:  03/25/2012  FINDINGS: Chronic cardiopericardial enlargement. Hyperinflated lungs. No edema, effusion, infiltrate, or pneumothorax. A known left lower lobe pulmonary nodule is not well seen by radiography. No acute osseous findings. There is osseous demineralization and notable bilateral glenohumeral osteoarthritis.  IMPRESSION: 1. No evidence of acute cardiopulmonary disease. 2. Chronic cardiomegaly without failure.  Electronically Signed   By: Tiburcio Pea   On: 08/30/2013 03:33   Ct Head Wo Contrast  08/29/2013   CLINICAL DATA:  Altered mental status ; pinpoint pupils  EXAM: CT HEAD WITHOUT CONTRAST  TECHNIQUE: Contiguous axial images were obtained from the base of the skull through the vertex without intravenous contrast. Study was obtained within 24 hr of patient's arrival at the emergency department.  COMPARISON:  Brain MRI January 04, 2008  FINDINGS: There is moderate diffuse atrophy. There is no mass, hemorrhage, extra-axial fluid collection, or midline shift. There is patchy small vessel disease in the centra semiovale bilaterally. These changes are greatest in the posterior centra semiovale, slightly  more severe on the right than on the left. No acute appearing infarct is seen on this study. There is no new gray-white compartment lesion.  Bony calvarium appears intact. The mastoid air cells are clear.  IMPRESSION: Atrophy with fairly extensive small vessel disease. No demonstrable mass, hemorrhage, or acute appearing infarct.  CriticalValue/emergent results were called by telephone at the time of interpretation on 08/29/2013 at [facility] , who verbally acknowledged these results.   Electronically Signed   By: Bretta Bang   On: 08/29/2013 14:11   Mr Brain Wo Contrast  08/30/2013   CLINICAL DATA:  79 year old female with confusion. Altered mental status. Stroke suspected.  EXAM: MRI HEAD WITHOUT CONTRAST  MRA HEAD WITHOUT CONTRAST  TECHNIQUE: Multiplanar, multiecho pulse sequences of the brain and surrounding structures were obtained without intravenous contrast. Angiographic images of the head were obtained using MRA technique without contrast.  COMPARISON:  Head CT without contrast 08/29/2013. Brain MRI 01/04/2008.  FINDINGS: MRI HEAD FINDINGS  Patchy restricted diffusion in the left MCA territory, mostly affecting the insula and central white matter. Subtle associated T2 and FLAIR hyperintensity. No mass effect or associated hemorrhage.  No right hemisphere or posterior fossa restricted diffusion. Major intracranial vascular flow voids are stable. There is a small area of increased FLAIR signal associated with left MCA M2 branches (series 8, image 13. See MRA findings.  Stable cerebral volume. No midline shift, mass effect, evidence of mass lesion, ventriculomegaly, extra-axial collection or acute intracranial hemorrhage. Cervicomedullary junction and pituitary are within normal limits. Chronic patchy in confluent scattered cerebral white matter T2 and FLAIR hyperintensity has not significantly changed. Negative visualized cervical spine. Normal bone marrow signal.  Stable orbits.  Chronic left mastoid effusion has increased. Mild paranasal sinus fluid and mucosal thickening is increased. The right mastoids are clear. Negative scalp soft tissues.  MRA HEAD FINDINGS  Antegrade flow in the posterior circulation with fairly codominant distal vertebral arteries. Normal right PICA. Dominant left AICA. Patent vertebrobasilar junction. No basilar artery stenosis. SCA and PCA origins are within normal limits. Bilateral PCA branches are within normal limits.  Antegrade flow in both ICA siphons. No ICA stenosis. Ophthalmic artery origins are within normal limits. Posterior communicating arteries are diminutive or absent. Carotid termini are patent. MCA and ACA origins are within normal limits. Tortuous but otherwise negative visualized ACA branches. Visualized right MCA branches are within normal limits.  Left MCA M1 segment is patent and within normal limits. Left MCA bifurcation is patent. Proximal M2 branches are patent and within normal limits. There is no left MCA major branch occlusion or stenosis identified.  IMPRESSION: MRI HEAD IMPRESSION  1. Small/patchy left MCA acute infarct. No mass effect or hemorrhage.  2. Otherwise stable MRI appearance of the brain since 2009.  3. Increased chronic  left mastoid effusion. Mild paranasal sinus inflammation.  MRA HEAD IMPRESSION  No major left MCA branch occlusion or stenosis. Negative for age intracranial MRA.   Electronically Signed   By: Augusto Gamble M.D.   On: 08/30/2013 09:47   Dg Chest Port 1 View  08/31/2013   CLINICAL DATA:  AFib with RVR, cough  EXAM: PORTABLE CHEST - 1 VIEW  COMPARISON:  08/29/2013  FINDINGS: Suspected emphysematous changes with hyperinflation. No pleural effusion or pneumothorax.  The heart is top-normal in size.  Degenerative changes of the bilateral shoulders.  IMPRESSION: No evidence of acute cardiopulmonary disease.   Electronically Signed   By: Charline Bills M.D.   On: 08/31/2013 09:25   Mr Maxine Glenn Head/brain Wo  Cm  08/30/2013   CLINICAL DATA:  79 year old female with confusion. Altered mental status. Stroke suspected.  EXAM: MRI HEAD WITHOUT CONTRAST  MRA HEAD WITHOUT CONTRAST  TECHNIQUE: Multiplanar, multiecho pulse sequences of the brain and surrounding structures were obtained without intravenous contrast. Angiographic images of the head were obtained using MRA technique without contrast.  COMPARISON:  Head CT without contrast 08/29/2013. Brain MRI 01/04/2008.  FINDINGS: MRI HEAD FINDINGS  Patchy restricted diffusion in the left MCA territory, mostly affecting the insula and central white matter. Subtle associated T2 and FLAIR hyperintensity. No mass effect or associated hemorrhage.  No right hemisphere or posterior fossa restricted diffusion. Major intracranial vascular flow voids are stable. There is a small area of increased FLAIR signal associated with left MCA M2 branches (series 8, image 13. See MRA findings.  Stable cerebral volume. No midline shift, mass effect, evidence of mass lesion, ventriculomegaly, extra-axial collection or acute intracranial hemorrhage. Cervicomedullary junction and pituitary are within normal limits. Chronic patchy in confluent scattered cerebral white matter T2 and FLAIR hyperintensity has not significantly changed. Negative visualized cervical spine. Normal bone marrow signal.  Stable orbits. Chronic left mastoid effusion has increased. Mild paranasal sinus fluid and mucosal thickening is increased. The right mastoids are clear. Negative scalp soft tissues.  MRA HEAD FINDINGS  Antegrade flow in the posterior circulation with fairly codominant distal vertebral arteries. Normal right PICA. Dominant left AICA. Patent vertebrobasilar junction. No basilar artery stenosis. SCA and PCA origins are within normal limits. Bilateral PCA branches are within normal limits.  Antegrade flow in both ICA siphons. No ICA stenosis. Ophthalmic artery origins are within normal limits. Posterior  communicating arteries are diminutive or absent. Carotid termini are patent. MCA and ACA origins are within normal limits. Tortuous but otherwise negative visualized ACA branches. Visualized right MCA branches are within normal limits.  Left MCA M1 segment is patent and within normal limits. Left MCA bifurcation is patent. Proximal M2 branches are patent and within normal limits. There is no left MCA major branch occlusion or stenosis identified.  IMPRESSION: MRI HEAD IMPRESSION  1. Small/patchy left MCA acute infarct. No mass effect or hemorrhage.  2. Otherwise stable MRI appearance of the brain since 2009.  3. Increased chronic left mastoid effusion. Mild paranasal sinus inflammation.  MRA HEAD IMPRESSION  No major left MCA branch occlusion or stenosis. Negative for age intracranial MRA.   Electronically Signed   By: Augusto Gamble M.D.   On: 08/30/2013 09:47    ASSESSMENT AND PLAN:  Principal Problem:   Stroke, acute, embolic Active Problems:   TIA (transient ischemic attack)   Hypertension   Atrial fibrillation   Dysphagia, unspecified(787.20)   Dysarthria   Palliative care encounter   Weakness generalized  1. afib  Presently in sinus rhythm Would start amiodarone 200mg  BID to keep her in sinus once taking POs.   2. Tachy/brady The patient had several long pauses on diltiazem.  Given her advanced age and paucity of symptoms, in the setting of her comorbidities, we should avoid PPM if able.  We will start amiodarone to try to keep her in sinus and follow while here on telemetry  3. Cva/ stroke risks The patient has afib and very high chads2vasc score (at least 6).  She should be anticoagulated long term.  Active W showed inadequate stroke prevention in afib patients with ASA/Plavix with bleeding risks no different that coumadin.  I would therefore recommend coumadin long term if no active bleeding if this can be logistically managed. Her CrCl is 16.  She is therefore not a candidate for novel  anticoagulants.  I will defer this decision to the primary team once taking POs  No new recs at this time. Will see as needed Please call with questions   Hillis Range, MD 09/03/2013 4:25 PM

## 2013-09-03 NOTE — Progress Notes (Signed)
Attending Addendum  I examined the patient and discussed the assessment and plan with Dr. Gayla Doss. I have reviewed the note and agree. Patient able to tell me her name this AM. Still with tongue deviation to the R. She needs IR guided NG tube placement for medications and nutrition. She will continue to work with Allayne Stack, MD FAMILY MEDICINE TEACHING SERVICE

## 2013-09-04 LAB — GLUCOSE, CAPILLARY
Glucose-Capillary: 112 mg/dL — ABNORMAL HIGH (ref 70–99)
Glucose-Capillary: 152 mg/dL — ABNORMAL HIGH (ref 70–99)
Glucose-Capillary: 167 mg/dL — ABNORMAL HIGH (ref 70–99)
Glucose-Capillary: 164 mg/dL — ABNORMAL HIGH (ref 70–99)

## 2013-09-04 LAB — CBC
MCHC: 32.1 g/dL (ref 30.0–36.0)
MCV: 95.5 fL (ref 78.0–100.0)
Platelets: 194 10*3/uL (ref 150–400)
RDW: 13.7 % (ref 11.5–15.5)

## 2013-09-04 LAB — BASIC METABOLIC PANEL
BUN: 18 mg/dL (ref 6–23)
CO2: 20 mEq/L (ref 19–32)
GFR calc Af Amer: 43 mL/min — ABNORMAL LOW (ref >90)
GFR calc non Af Amer: 37 mL/min — ABNORMAL LOW (ref >90)
Potassium: 3.2 mEq/L — ABNORMAL LOW (ref 3.5–5.1)
Sodium: 142 mEq/L (ref 135–145)

## 2013-09-04 LAB — CULTURE, BLOOD (ROUTINE X 2): Culture: NO GROWTH

## 2013-09-04 MED ORDER — ASPIRIN 325 MG PO TABS
325.0000 mg | ORAL_TABLET | Freq: Every day | ORAL | Status: DC
  Administered 2013-09-05: 325 mg via ORAL
  Filled 2013-09-04: qty 1

## 2013-09-04 MED ORDER — ASPIRIN 325 MG PO TABS
325.0000 mg | ORAL_TABLET | Freq: Every day | ORAL | Status: DC
  Filled 2013-09-04: qty 1

## 2013-09-04 MED ORDER — HYDROCHLOROTHIAZIDE 10 MG/ML ORAL SUSPENSION
6.2500 mg | Freq: Every day | ORAL | Status: DC
  Administered 2013-09-04 – 2013-09-05 (×2): 6.25 mg via ORAL
  Filled 2013-09-04 (×2): qty 1.25

## 2013-09-04 MED ORDER — STROKE: EARLY STAGES OF RECOVERY BOOK
Freq: Once | Status: AC
  Administered 2013-09-04: 1
  Filled 2013-09-04: qty 1

## 2013-09-04 NOTE — Progress Notes (Signed)
Family Medicine Teaching Service Daily Progress Note Intern Pager: [phone]  Patient name: [name] Medical record number: [id number] Date of birth: [birth year] Age: 79 y.o. Gender: female  Primary Care Provider: Elvina Sidle, MD Consultants: Neuro Code Status: Full  Pt Overview and Major Events to Date:  9/19:  - MRI/MRA left MCA branch stroke  - Afib with RVR, transfer to stepdown and start dilt drip  9/20: Dilt drip held given normalized rate this AM 9/21: 6:30 AM pt went back into Afib with RVR 130s. Restarted dilt drip.  9/23: NG placed; tube feeds initiated   Assessment and Plan: [name] is a 79 y.o. female presenting with expressive aphasia and left sided weakness that started this afternoon. PMH is significant for HTN, dementia, gastric ca, PUD, and stroke in 2003 (takes ASA 81).   #Neuro: Stroke-small left MCA acute infarct on MRI/MRA. Aphasic and some right sided weakness. - Clinical sx consistent with acute stroke with severe aphasia, not much improved. Carotid doppler 1-39% stenosis bilaterally. - PT/OT/Speech recommending HH and NPO - Troponin neg x 3, mildly pos x 1 to 0.31, neg x 3 yesterday - Neuro rec: ASA 300mg  & Plavix, poor candidate for Anticoag; possible TEE as outpt - GOC discuss: HH vs SNF vs CIR: initiated w/ family will f/u with SW (son upset about palliative discussion)  - PT and Neuro rec CIR (Logue, Eugenia M; [phone]) - Failed modified barium swallow 9/22, NG tube placed by IR 9/23  #Atrial fibrillation with RVR - Patient presented with first instance of Afib with RVR - Diltiazem drip stopped 9/21 and 2.5mg  metoprolol IV x 1  - Repeat troponins yesterday neg x 3, TSH wnl - Appreciate cardiology recs: Coumadin, amiodarone 200mg  BID (started 9/23), ASA 300 (Continued) - Continue Lipitor 40mg  qd - Has asa 300mg  suppository; plavix to start 9/24  - Considering Coumadin,hesitate to anticoagulate given fall risk with dementia - CBC 9/22:  anemia Hgb 9.4 (9/22) < 11.3 (9/19): recheck today  # Sacral ulcer:  - Measurement: 0.5 cm x 1 cm; no drainage  - Continue Allevyn sacral dressing - WOC managing   #Metabolic acidosis: - Bmet 9/22:  Bicarb 19, gap 13. Recheck today - On D5+1/2NS 50cc/hr.  # Dementia: Has outpatient neurologist. TIA 2003 and pt was on aspirin 81mg . - Mild dementia, taking namenda and aricept at home though currently being held  - Restart when able or consider alternatives  #GU: S/p 3 days ceftriaxone IV for suspected UTI vs non-clean catch - Creatinine 1.79 (9/22)  #H/o HTN - Currently only prescribed lasix for leg edema, per family.  - BP last 24 hrs 140-186/68-82 - Permissive HTN in setting of acute infarction with >200/100 to call FPTS for low dose hydralazine  #Left hip tenderness - Recent h/o hip fx surgery but no subsequent fever, skin breakdown, or fall.  - Monitor, possibly due to prolonged lying  #Coccygeal ulcer: Decubitus pressure ulcer present on admission. Nursing placing padded dressings over sacrum - WOC recs appreciated: Continue allevyn sacral dressing, change every 3 days and PRN for soilage.   FEN/GI: NPO per speech s/p MCA stroke; Family is considering NG tube for meds/feeds - Fluids: D5 1/2 NS @ 50 mL/hr (K 3.5 on 9/22) - Tubes feeds: Jevity @ 45 cc/hr Currently underweight (BMI 16.9); Nutrition following PPx: SQ heparin  Disposition: Pending clinical course; PT/OT recommend home health services upon discharge; CM consulted and [facility] has been contacted - Outpatient PET scan with Dr Vassie Loll for lung  nodules  Subjective: Pt sleeping but easily awoken. No apparent complaints, but still not speaking; denies CP, SOB.  Objective: Temp:  [97.5 F (36.4 C)-97.9 F (36.6 C)] 97.5 F (36.4 C) (09/24 0320) Pulse Rate:  [57-67] 66 (09/24 0320) Resp:  [14-26] 15 (09/24 0320) BP: (145-186)/(60-82) 145/60 mmHg (09/24 0320) SpO2:  [97 %-99 %] 97 % (09/24 0320) Physical Exam: General:  NAD, lying in bed, Cardiovascular: RRR, normal heart sounds, no murmurs Respiratory: CTAB, normal effort Extremities: no LLE, no calf tenderness Neuro: speaking in sentences, smiles, interacts appropriately, follows commands  Laboratory:  Recent Labs Lab 08/29/13 2054 08/30/13 0240 09/02/13 0924  WBC 7.2 10.4 7.0  HGB 10.8* 11.3* 9.4*  HCT 32.6* 34.1* 30.1*  PLT 202 215 202    Recent Labs Lab 08/29/13 1357  08/30/13 0240 09/01/13 2040 09/02/13 0924  NA 137  < > 141 147* 146*  K 3.8  < > 3.7 3.7 3.5  CL 100  < > 107 114* 114*  CO2 24  --  20 15* 19  BUN 41*  < > 37* 29* 29*  CREATININE 1.73*  < > 1.60* 1.74* 1.79*  CALCIUM 9.1  --  8.3* 9.0 9.3  PROT 6.9  --  6.2  --   --   BILITOT 0.3  --  0.2*  --   --   ALKPHOS 115  --  102  --   --   ALT 12  --  11  --   --   AST 22  --  18  --   --   GLUCOSE 109*  < > 104* 87 117*  < > = values in this interval not displayed.    Recent Labs Lab 08/31/13 0900 08/31/13 1725 09/01/13 1131 09/01/13 2040 09/02/13 0145  TROPONINI 0.31* <0.30 <0.30 <0.30 <0.30    Imaging/Diagnostic Tests: DG Naso tube IMPRESSION: 9/23 Feeding tube tip in the proximal 4th portion of the duodenum.  CXR IMPRESSION: 9/18 No evidence of acute cardiopulmonary disease.  MRI HEAD IMPRESSION 9/18 1. Small/patchy left MCA acute infarct. No mass effect or  hemorrhage.  2. Otherwise stable MRI appearance of the brain since 2009.  3. Increased chronic left mastoid effusion. Mild paranasal sinus  inflammation.  MRA HEAD IMPRESSION 9/18  No major left MCA branch occlusion or stenosis. Negative for age  intracranial MRA.  Wenda Low, MD 09/04/2013, 7:10 AM PGY-1, [facility] Family Medicine FPTS Intern pager: [phone], text pages welcome

## 2013-09-04 NOTE — Progress Notes (Signed)
Attending Addendum  I examined the patient and discussed the assessment and plan with Dr. Joyner. I have reviewed the note and agree.    Wilmer Santillo, MD FAMILY MEDICINE TEACHING SERVICE    

## 2013-09-04 NOTE — Progress Notes (Signed)
Speech Language Pathology:  Speech and Swallow Treatment Patient Details Name: [name] MRN: [id number] DOB: 01-04-1926 Today's Date: 09/04/2013 Time: 1420-1440 SLP Time Calculation (min): 20 min  Assessment / Plan / Recommendation Clinical Impression  Pt. seen for dysphagia and speech treatment with son at bedside.  Oral mucousa much improved upon observation.  Pt. recalled and demonstrated effortful swallow exercise with mod verbal cues to assist in swallow initiation.  Pt. modulated vocal pitch for /i/ (pronounced as "e") to facilitate laryngeal elevation with min-mod verbal cues for deep inhalation and sustained for approximately 15 seconds.  Elevation of larynx was palpable.  She produced cough while weight bearing (pushing against hand) with min verbal cues although cough is moderately weak.  Pt. 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  Intelligibility was intermittently improved after verbal/visual cues given to increase ROM to lips/tongue when speaking.  Spontaneously uttered an intelligible 8 word sentence.  Encouraged pt. to continue practicing dysphagia exercises, focus on increasing lip and tongue movement when talking and inhaling deeply prior to speech.      SLP Plan  Continue with current plan of care    Pertinent Vitals/Pain none  SLP Goals  SLP Goals Potential to Achieve Goals: Good SLP Goal #1: Pt will verbalize at syllable level (bilabial) with max verbal and visual cues x3.  SLP Goal #1 - Progress: Progressing toward goal SLP Goal #2: Pt will verbalize during automatic speech tasks with max verbal contextual cues x1.  SLP Goal #2 - Progress: Progressing toward goal SLP Goal #3: Pt will indicate basic wants needs using gestures of communciation board with min assist x3.  SLP Goal #3 - Progress: Progressing toward goal  General Respiratory Status: Room  air Behavior/Cognition: Alert;Cooperative;Pleasant mood;Requires cueing Oral Cavity - Dentition: Adequate natural dentition Patient Positioning: Upright in bed  Oral Cavity - Oral Hygiene Does patient have any of the following "at risk" factors?: Other - dysphagia Brush patient's teeth BID with toothbrush (using toothpaste with fluoride): Yes Patient is HIGH RISK - Oral Care Protocol followed (see row info): Yes Patient is AT RISK - Oral Care Protocol followed (see row info): Yes   Treatment Treatment focused on: Dysarthria (dyphagia) Skilled Treatment: see impression statement   GO     Royce Macadamia M.Ed [employer] [phone]  09/04/2013

## 2013-09-04 NOTE — Discharge Summary (Signed)
Family Medicine Teaching [facility] Discharge Summary  Patient name: [name] Medical record number: [id number] Date of birth: 01/31/1926 Age: 79 y.o. Gender: female Date of Admission: 08/29/2013  Date of Discharge: 09/05/13 Admitting Physician: Sanjuana Letters, MD  Primary Care Provider: Elvina Sidle, MD Consultants: Cardiology & Neurology  Indication for Hospitalization: Left MCA acute infarct  Discharge Diagnoses/Problem List:  Patient Active Problem List   Diagnosis Date Noted  . Dysphagia, unspecified(787.20) 09/02/2013  . Dysarthria 09/02/2013  . Palliative care encounter 09/02/2013  . Weakness generalized 09/02/2013  . Atrial fibrillation 09/01/2013  . Stroke, acute, embolic 09/01/2013  . Hypertension 08/29/2013  . Dementia 03/25/2012  . Lung nodule 05/24/2011   Disposition: CIR  Discharge Condition: Stable   Brief Hospital Course:  [name] is a 79 y.o. Female who presented with new left MCA TIA vs acute infarct on MRI/MRA. PMH significant for TIA (2003), HTN, Stomach CA, Lung nodule, and dementia. She initial had slurred speech which progressed to being unable to speak overnight. Neurology was consulted and recommending starting plavix, and ECHO, carotid dopplers, risk stratification labs, and PT/OT/SLP consults were ordered. Additionally her initial temperature was 96.9 on admission with foul-smelling urine and sepsis was ruled out with negative blood and urine cultures Rocephin was discontinued after 3 days.  She was admitted to telemetry. Oral medications were held, a swallow study revealed signicant dysphagia marked by deficits in oral manipulation, and she was made NPO 9/19. She developed new Afib on 9/19 that resolved on a diltiazem drip, and she was transferred to stepdown. EKG showed RBBB and troponin's were negative x 3. Afib with RVR on 9/21 that did not resolve with Dilt drip, but converted to NSR with Metoprolol 2.5mg  IV once. Cardiology was  consulted and recommended starting Amiodarone and Coumadin. NG was placed with IR assistance after traumatic unsuccessful 1st attempt. Tube feeds were initiated and increased to goal 45cc/hr of Jevity.  [name] showed improvement with regaining some speech ability, was A&Ox3, and expressed desire in CIR with ultimate goal to return home. Her ambulation improved with PT, and her plavix was discontinued and Coumadin started on 9/25. Reassess fall risk with continued PT.   Neuro: Small left MCA acute infarct on MRI/MRA. Had mild baseline Dementia, was on namenda & aricept, and has outpatient neurologist. TIA 2003 and pt was on aspirin 81mg  at admission. Speech difficult progressed to aphasia with some right sided weakness. Carotid doppler 1-39% stenosis bilaterally. Made NPO due to dysphagia until NG able to be placed on 9/23. ASA 300mg  & Plavix per Neurology recommendations. ECHO revealed: Left ventricle cavity size was normal. Wall thickness was normal. Systolic function was vigorous. The estimated ejection fraction was in the range of 65% to 70%. Speech improving was patient expressed desire for CIR and ultimately to return home with son, Nida Boatman.  Namenda and aricept were held throughout hospitalization, consider restarting at discharge. Her ambulation improved with PT, and her plavix was discontinued and Coumadin started on 9/25.   Cardiac HTN on admission (only prescribed lasix for leg edema on admission). Permissive HTN allowed in setting of acute infarction with >200/100. Lasix was discontinued as no signs of fluid overload. Troponin neg x 3, then mildly pos x 1 @ 0.31 then neg x 3 afterwards. Experienced two episodes of new Afib that responded to Dilt drip during first episode, and Metoprolol for second. Treated with ASA 300mg  & Plavix as poor candidate for Anticoag due to fall risk. Cardiology recommended Coumadin, and  as her ambulation improved with PT, plavix was discontinued and Coumadin started on 9/25.  TSH wnl on 9/22. BP remained < 200/100, and HCTZ was started on 9/24 for BP range (140-186/68-82).   Renal S/p 3 days ceftriaxone IV for suspected UTI vs non-clean catch. Creatinine elevated to 1.79 on 9/22, but decreased to 1.27 on 9/24.  Sacral ulcer: Decubitus pressure ulcer present on admission. Nursing placing padded dressings over sacrum. Measurement: 0.5 cm x 1 cm; no drainage WOC consulted.  Continue allevyn sacral dressing, change every 3 days and PRN for soilage.   Left hip tenderness - Recent h/o hip fx surgery but no subsequent fever, skin breakdown, or fall. Monitor, possibly due to prolonged lying   Issues for Follow Up: 1. Assess need for home PT/OT/SLP/RN home services at discharge 2. Coumadin started (9/25) for PAF; assess fall risk assessment after CIR 3. Reduce ASA to 81 mg after 21 days of treatment (09/22/13)  Significant Procedures: NG tube placement  Significant Labs and Imaging:   Recent Labs Lab 08/30/13 0240 09/02/13 0924 09/04/13 1130  WBC 10.4 7.0 5.6  HGB 11.3* 9.4* 10.2*  HCT 34.1* 30.1* 31.8*  PLT 215 202 194    Recent Labs Lab 08/29/13 1357 08/29/13 1412 08/29/13 2054 08/30/13 0240 09/01/13 2040 09/02/13 0924 09/04/13 1130  NA 137 141  --  141 147* 146* 142  K 3.8 3.9  --  3.7 3.7 3.5 3.2*  CL 100 107  --  107 114* 114* 110  CO2 24  --   --  20 15* 19 20  GLUCOSE 109* 95  --  104* 87 117* 183*  BUN 41* 40*  --  37* 29* 29* 18  CREATININE 1.73* 1.80* 1.61* 1.60* 1.74* 1.79* 1.27*  CALCIUM 9.1  --   --  8.3* 9.0 9.3 8.3*  ALKPHOS 115  --   --  102  --   --   --   AST 22  --   --  18  --   --   --   ALT 12  --   --  11  --   --   --   ALBUMIN 3.1*  --   --  3.0*  --   --   --    Imaging/Diagnostic Tests:   DG Naso tube IMPRESSION: 9/23  Feeding tube tip in the proximal 4th portion of the duodenum.   CXR IMPRESSION: 9/18  No evidence of acute cardiopulmonary disease.   MRI HEAD IMPRESSION 9/18  1. Small/patchy left MCA acute  infarct. No mass effect or  hemorrhage.  2. Otherwise stable MRI appearance of the brain since 2009.  3. Increased chronic left mastoid effusion. Mild paranasal sinus  inflammation.   MRA HEAD IMPRESSION 9/18  No major left MCA branch occlusion or stenosis. Negative for age  intracranial MRA.  TTE Study Conclusions  - Left ventricle: The cavity size was normal. Wall thickness was normal. Systolic function was vigorous. The estimated ejection fraction was in the range of 65% to 70%. Wall motion was normal; there were no regional wall motion abnormalities. Doppler parameters are consistent with abnormal left ventricular relaxation (grade 1 diastolic dysfunction). - Aortic valve: Mild regurgitation. - Aortic root: The aortic root was mildly dilated. - Mitral valve: Calcified annulus.  Results/Tests Pending at Time of Discharge: None  Discharge Medications:    Medication List    STOP taking these medications       calcitRIOL 0.25 MCG capsule  Commonly  known as:  ROCALTROL     donepezil 10 MG tablet  Commonly known as:  ARICEPT     EQL GUMMY ADULT PO     ferrous fumarate 325 (106 FE) MG Tabs tablet  Commonly known as:  HEMOCYTE - 106 mg FE     furosemide 20 MG tablet  Commonly known as:  LASIX     memantine 10 MG tablet  Commonly known as:  NAMENDA     sodium bicarbonate 650 MG tablet     vitamin B-12 1000 MCG tablet  Commonly known as:  CYANOCOBALAMIN      TAKE these medications       acetaminophen 325 MG tablet  Commonly known as:  TYLENOL  Take 2 tablets (650 mg total) by mouth every 4 (four) hours as needed.     amiodarone 200 MG tablet  Commonly known as:  PACERONE  Take 1 tablet (200 mg total) by mouth 2 (two) times daily.     antiseptic oral rinse Liqd  15 mLs by Mouth Rinse route 2 times daily at 12 noon and 4 pm.     aspirin 325 MG tablet  Take 1 tablet (325 mg total) by mouth daily.     atorvastatin 40 MG tablet  Commonly known as:  LIPITOR   Take 1 tablet (40 mg total) by mouth daily at 6 PM.     chlorhexidine 0.12 % solution  Commonly known as:  PERIDEX  Use as directed 15 mLs in the mouth or throat 2 (two) times daily.     feeding supplement (JEVITY 1.2 CAL) Liqd  Place 1,000 mLs into feeding tube continuous.     free water Soln  Place 225 mLs into feeding tube 4 (four) times daily as needed (prevent hyponatremia).     heparin 5000 UNIT/ML injection  Inject 1 mL (5,000 Units total) into the skin every 8 (eight) hours.     hydrALAZINE 20 MG/ML injection  Commonly known as:  APRESOLINE  Inject 0.1 mLs (2 mg total) into the vein every 4 (four) hours as needed (BP >200/100).     hydrochlorothiazide 10 mg/mL Susp  Take 0.63 mLs (6.25 mg total) by mouth daily.     senna-docusate 8.6-50 MG per tablet  Commonly known as:  Senokot-S  Take 1 tablet by mouth at bedtime as needed.       Discharge Instructions: Please refer to Patient Instructions section of EMR for full details.  Patient was counseled important signs and symptoms that should prompt return to medical care, changes in medications, dietary instructions, activity restrictions, and follow up appointments.   Follow-Up Appointments:  Wenda Low, MD 09/04/2013, 12:24 PM PGY-1, [facility] Family [facility]

## 2013-09-04 NOTE — Progress Notes (Signed)
Occupational Therapy Treatment Patient Details Name: [name] MRN: [id number] DOB: 30-Aug-1926 Today's Date: 09/04/2013 Time: 7829-5621 OT Time Calculation (min): 31 min  OT Assessment / Plan / Recommendation  History of present illness 79 y.o. female admitted to [facility] on 08/29/13 with acute onset aphasia and per H&P left sided weakness.  Also, of note, she also has a UTI.  MRI (+) LT MCA acute infarct    OT comments  Pt making steady progress. Spontaneously uses RUE functionally. Limitations with shoulder strength. Pt most likely with prior  RTC impairment. Pt requires mod A for postural control during functional activities. Rec CIR for rehab in order to facilitate D/C home with son , who will prodvie 24/7 assistance after D/C. Will continue to follow.  Follow Up Recommendations  CIR;Supervision/Assistance - 24 hour    Barriers to Discharge       Equipment Recommendations  None recommended by OT    Recommendations for Other Services    Frequency Min 2X/week   Progress towards OT Goals Progress towards OT goals: Progressing toward goals  Plan Discharge plan needs to be updated    Precautions / Restrictions Precautions Precautions: Fall Precaution Comments: pt with both urinary and bowel incontinence   Pertinent Vitals/Pain Vitals stable    ADL  Grooming: Supervision/safety;Set up Toilet Transfer: Maximal assistance Toilet Transfer Method: Stand pivot Toilet Transfer Equipment: Bedside commode Toileting - Clothing Manipulation and Hygiene: Maximal assistance Equipment Used: Gait belt Transfers/Ambulation Related to ADLs: Max A. R bias. heavy lean to R. max A to achieve upright midline posture ADL Comments: pariticpating in ADL    OT Diagnosis:    OT Problem List:   OT Treatment Interventions:     OT Goals(current goals can now be found in the care plan section) Acute Rehab OT Goals Patient Stated Goal: go home OT Goal Formulation: With patient Time For Goal  Achievement: 09/13/13 Potential to Achieve Goals: Good ADL Goals Pt Will Transfer to Toilet: with supervision;ambulating Pt Will Perform Toileting - Clothing Manipulation and hygiene: with supervision;sit to/from stand Additional ADL Goal #1: pt wll complete visual task with 100% accuracy  Visit Information  Last OT Received On: 09/04/13 Assistance Needed: +1 History of Present Illness: 79 y.o. female admitted to [facility] on 08/29/13 with acute onset aphasia and per H&P left sided weakness.  Also, of note, she also has a UTI.  MRI (+) LT MCA acute infarct     Subjective Data      Prior Functioning       Cognition  Cognition Arousal/Alertness: Awake/alert Behavior During Therapy: WFL for tasks assessed/performed Overall Cognitive Status: History of cognitive impairments - at baseline    Mobility  Bed Mobility Bed Mobility: Supine to Sit Supine to Sit: 3: Mod assist;HOB elevated Sitting - Scoot to Edge of Bed: 3: Mod assist Transfers Sit to Stand: 3: Mod assist;From bed Stand to Sit: 3: Mod assist;To chair/3-in-1 Details for Transfer Assistance: poor sense of midline orientation    Exercises  Other Exercises Other Exercises: BLE leg lifts and LUE AROM - encouraged son to complete with his mother   Balance Static Sitting Balance Static Sitting - Balance Support: Feet supported;No upper extremity supported Static Sitting - Level of Assistance: Other (comment);5: Stand by assistance (vc for upright. leans R) Static Standing Balance Static Standing - Balance Support: Bilateral upper extremity supported;During functional activity Static Standing - Level of Assistance: 2: Max assist;Other (comment) (during toileting)   End of Session OT - End of Session  Equipment Utilized During Treatment: Gait belt Activity Tolerance: Patient tolerated treatment well Patient left: in chair;with call bell/phone within reach;with family/visitor present Nurse Communication: Mobility status  GO      Hesham Womac,HILLARY 09/04/2013, 3:40 PM [facility], OTR/L  [phone] 09/04/2013

## 2013-09-04 NOTE — Clinical Documentation Improvement (Signed)
THIS DOCUMENT IS NOT A PERMANENT PART OF THE MEDICAL RECORD  Please update your documentation with the medical record to reflect your response to this query. If you need help knowing how to do this please call [phone].  09/04/13   Dr. Gayla Doss,  In a better effort to capture your patient's severity of illness, reflect appropriate length of stay and utilization of resources, a review of the patient medical record has revealed the following information:   "Neurology is at the bedside. Patient is nonfocal on exam this time and symptoms appear to have resolved. They recommend sepsis workup an inpatient admission. MRI will be obtained as an inpatient." ED Provider Notes signed by Shon Baton, MD at 08/29/2013 4:30 PM   "Patient had foul-smelling urine and was hypothermic with a rectal temperature of 96.9. She's being admitted for TIA workup as well as for possible urosepsis." Progress Notes signed by Micki Riley, MD at 09/03/2013 4:54 PM    "#GU: S/p 3 days ceftriaxone IV for suspected UTI vs non-clean catch  - Creatinine 1.79 (9/22)" Progress Notes signed by Wenda Low, MD at 09/04/2013 8:39 AM    Based on your clinical judgment, please document in the progress notes and discharge summary if the diagnosis of "Sepsis" was:   - Ruled Out   - Confirmed, and Present on Admission   - Other Condition   - Unable to Clinically Determine   In responding to this query please exercise your independent judgment.    The fact that a query is asked, does not imply that any particular answer is desired or expected.   You may use Possible, Probable, or Suspected with inpatient documentation.   Possible, Probable, or Suspected diagnoses MUST be documented as such at the time of discharge, unless confirmed or ruled out.  Reviewed: additional documentation in the medical record  Thank You,  Jerral Ralph  RN BSN CCDS Certified Clinical Documentation  Specialist: [phone] Health Information Management [place]

## 2013-09-05 ENCOUNTER — Encounter (HOSPITAL_COMMUNITY): Admission: RE | Admit: 2013-09-05 | Payer: Medicare Other | Source: Ambulatory Visit

## 2013-09-05 ENCOUNTER — Inpatient Hospital Stay (HOSPITAL_COMMUNITY)
Admission: RE | Admit: 2013-09-05 | Discharge: 2013-09-17 | DRG: 945 | Disposition: A | Discharge status: Home Health | Payer: Medicare Other | Source: Intra-hospital | Attending: Physical Medicine & Rehabilitation | Admitting: Physical Medicine & Rehabilitation

## 2013-09-05 DIAGNOSIS — R1312 Dysphagia, oropharyngeal phase: Secondary | ICD-10-CM

## 2013-09-05 DIAGNOSIS — R1310 Dysphagia, unspecified: Secondary | ICD-10-CM | POA: Diagnosis present

## 2013-09-05 DIAGNOSIS — Z96649 Presence of unspecified artificial hip joint: Secondary | ICD-10-CM

## 2013-09-05 DIAGNOSIS — Z8711 Personal history of peptic ulcer disease: Secondary | ICD-10-CM

## 2013-09-05 DIAGNOSIS — G8110 Spastic hemiplegia affecting unspecified side: Secondary | ICD-10-CM

## 2013-09-05 DIAGNOSIS — Z87891 Personal history of nicotine dependence: Secondary | ICD-10-CM

## 2013-09-05 DIAGNOSIS — N189 Chronic kidney disease, unspecified: Secondary | ICD-10-CM

## 2013-09-05 DIAGNOSIS — R4701 Aphasia: Secondary | ICD-10-CM

## 2013-09-05 DIAGNOSIS — I6340 Cerebral infarction due to embolism of unspecified cerebral artery: Secondary | ICD-10-CM

## 2013-09-05 DIAGNOSIS — I69920 Aphasia following unspecified cerebrovascular disease: Secondary | ICD-10-CM

## 2013-09-05 DIAGNOSIS — M2010 Hallux valgus (acquired), unspecified foot: Secondary | ICD-10-CM

## 2013-09-05 DIAGNOSIS — Z5189 Encounter for other specified aftercare: Principal | ICD-10-CM

## 2013-09-05 DIAGNOSIS — R64 Cachexia: Secondary | ICD-10-CM

## 2013-09-05 DIAGNOSIS — Z85028 Personal history of other malignant neoplasm of stomach: Secondary | ICD-10-CM

## 2013-09-05 DIAGNOSIS — I639 Cerebral infarction, unspecified: Secondary | ICD-10-CM | POA: Diagnosis present

## 2013-09-05 DIAGNOSIS — I498 Other specified cardiac arrhythmias: Secondary | ICD-10-CM

## 2013-09-05 DIAGNOSIS — I4891 Unspecified atrial fibrillation: Secondary | ICD-10-CM | POA: Diagnosis present

## 2013-09-05 DIAGNOSIS — F0390 Unspecified dementia without behavioral disturbance: Secondary | ICD-10-CM | POA: Diagnosis present

## 2013-09-05 DIAGNOSIS — I10 Essential (primary) hypertension: Secondary | ICD-10-CM

## 2013-09-05 DIAGNOSIS — R471 Dysarthria and anarthria: Secondary | ICD-10-CM | POA: Diagnosis present

## 2013-09-05 DIAGNOSIS — I129 Hypertensive chronic kidney disease with stage 1 through stage 4 chronic kidney disease, or unspecified chronic kidney disease: Secondary | ICD-10-CM

## 2013-09-05 DIAGNOSIS — R32 Unspecified urinary incontinence: Secondary | ICD-10-CM

## 2013-09-05 LAB — GLUCOSE, CAPILLARY
Glucose-Capillary: 172 mg/dL — ABNORMAL HIGH (ref 70–99)
Glucose-Capillary: 147 mg/dL — ABNORMAL HIGH (ref 70–99)

## 2013-09-05 MED ORDER — ASPIRIN EC 325 MG PO TBEC
325.0000 mg | DELAYED_RELEASE_TABLET | Freq: Every day | ORAL | Status: DC
  Administered 2013-09-06 – 2013-09-08 (×3): 325 mg via ORAL
  Filled 2013-09-05 (×4): qty 1

## 2013-09-05 MED ORDER — COUMADIN BOOK
Freq: Once | Status: DC
  Filled 2013-09-05: qty 1

## 2013-09-05 MED ORDER — WARFARIN VIDEO
Freq: Once | Status: AC
  Administered 2013-09-06: 12:00:00

## 2013-09-05 MED ORDER — BIOTENE DRY MOUTH MT LIQD: 15.0000 mL | Freq: Two times a day (BID) | OROMUCOSAL | Status: DC

## 2013-09-05 MED ORDER — AMIODARONE HCL 200 MG PO TABS: 200.0000 mg | ORAL_TABLET | Freq: Two times a day (BID) | ORAL | Status: DC

## 2013-09-05 MED ORDER — JEVITY 1.2 CAL PO LIQD: 1000.0000 mL | ORAL | Status: DC

## 2013-09-05 MED ORDER — BIOTENE DRY MOUTH MT LIQD
15.0000 mL | Freq: Two times a day (BID) | OROMUCOSAL | Status: DC
  Administered 2013-09-06 – 2013-09-17 (×22): 15 mL via OROMUCOSAL

## 2013-09-05 MED ORDER — POTASSIUM CHLORIDE 20 MEQ/15ML (10%) PO LIQD
40.0000 meq | Freq: Once | ORAL | Status: AC
  Administered 2013-09-05: 40 meq via ORAL
  Filled 2013-09-05: qty 30

## 2013-09-05 MED ORDER — HYDROCHLOROTHIAZIDE 10 MG/ML ORAL SUSPENSION: 6.2500 mg | Freq: Every day | ORAL | Status: DC

## 2013-09-05 MED ORDER — WARFARIN - PHARMACIST DOSING INPATIENT
Freq: Every day | Status: DC
  Administered 2013-09-07: 2.5
  Administered 2013-09-08: 1

## 2013-09-05 MED ORDER — HEPARIN SODIUM (PORCINE) 5000 UNIT/ML IJ SOLN: 5000.0000 [IU] | Freq: Three times a day (TID) | INTRAMUSCULAR | Status: DC

## 2013-09-05 MED ORDER — ENOXAPARIN SODIUM 30 MG/0.3ML ~~LOC~~ SOLN
30.0000 mg | SUBCUTANEOUS | Status: DC
  Administered 2013-09-06 – 2013-09-13 (×8): 30 mg via SUBCUTANEOUS
  Filled 2013-09-05 (×10): qty 0.3

## 2013-09-05 MED ORDER — FREE WATER: 225.0000 mL | Freq: Four times a day (QID) | Status: DC | PRN

## 2013-09-05 MED ORDER — PROCHLORPERAZINE 25 MG RE SUPP
12.5000 mg | Freq: Four times a day (QID) | RECTAL | Status: DC | PRN
  Filled 2013-09-05: qty 1

## 2013-09-05 MED ORDER — SENNOSIDES-DOCUSATE SODIUM 8.6-50 MG PO TABS: 1.0000 | ORAL_TABLET | Freq: Every evening | ORAL | Status: DC | PRN

## 2013-09-05 MED ORDER — PANTOPRAZOLE SODIUM 40 MG PO PACK
40.0000 mg | PACK | Freq: Every day | ORAL | Status: DC
  Administered 2013-09-06 – 2013-09-17 (×11): 40 mg
  Filled 2013-09-05 (×15): qty 20

## 2013-09-05 MED ORDER — BISACODYL 10 MG RE SUPP
10.0000 mg | Freq: Every day | RECTAL | Status: DC | PRN
  Administered 2013-09-13: 10 mg via RECTAL
  Filled 2013-09-05: qty 1

## 2013-09-05 MED ORDER — POTASSIUM CHLORIDE CRYS ER 20 MEQ PO TBCR: 40.0000 meq | EXTENDED_RELEASE_TABLET | Freq: Once | ORAL | Status: DC

## 2013-09-05 MED ORDER — ASPIRIN 325 MG PO TABS
325.0000 mg | ORAL_TABLET | Freq: Every day | ORAL | Status: DC
  Filled 2013-09-05: qty 1

## 2013-09-05 MED ORDER — TRAZODONE HCL 50 MG PO TABS: 25.0000 mg | ORAL_TABLET | Freq: Every evening | ORAL | Status: DC | PRN

## 2013-09-05 MED ORDER — ACETAMINOPHEN 325 MG PO TABS: 325.0000 mg | ORAL_TABLET | ORAL | Status: DC | PRN

## 2013-09-05 MED ORDER — HYDRALAZINE HCL 20 MG/ML IJ SOLN: 2.0000 mg | INTRAMUSCULAR | Status: DC | PRN

## 2013-09-05 MED ORDER — WARFARIN - PHARMACIST DOSING INPATIENT: Freq: Every day | Status: DC

## 2013-09-05 MED ORDER — PROCHLORPERAZINE EDISYLATE 5 MG/ML IJ SOLN
5.0000 mg | Freq: Four times a day (QID) | INTRAMUSCULAR | Status: DC | PRN
  Filled 2013-09-05: qty 2

## 2013-09-05 MED ORDER — ACETAMINOPHEN 325 MG PO TABS: 650.0000 mg | ORAL_TABLET | ORAL | Status: DC | PRN

## 2013-09-05 MED ORDER — JEVITY 1.2 CAL PO LIQD
1000.0000 mL | ORAL | Status: DC
  Filled 2013-09-05 (×3): qty 1000

## 2013-09-05 MED ORDER — WARFARIN SODIUM 2.5 MG PO TABS
2.5000 mg | ORAL_TABLET | Freq: Once | ORAL | Status: AC
  Administered 2013-09-05: 2.5 mg via ORAL
  Filled 2013-09-05: qty 1

## 2013-09-05 MED ORDER — CALCITRIOL 1 MCG/ML PO SOLN
0.2500 ug | Freq: Every day | ORAL | Status: DC
  Administered 2013-09-06 – 2013-09-17 (×12): 0.25 ug
  Filled 2013-09-05 (×15): qty 0.25

## 2013-09-05 MED ORDER — SENNOSIDES 8.8 MG/5ML PO SYRP
10.0000 mL | ORAL_SOLUTION | Freq: Every evening | ORAL | Status: DC | PRN
  Filled 2013-09-05: qty 10

## 2013-09-05 MED ORDER — SODIUM BICARBONATE 650 MG PO TABS
650.0000 mg | ORAL_TABLET | Freq: Four times a day (QID) | ORAL | Status: DC
  Administered 2013-09-05 – 2013-09-17 (×47): 650 mg
  Filled 2013-09-05 (×50): qty 1

## 2013-09-05 MED ORDER — CHLORHEXIDINE GLUCONATE 0.12 % MT SOLN: 15.0000 mL | Freq: Two times a day (BID) | OROMUCOSAL | Status: DC

## 2013-09-05 MED ORDER — FUROSEMIDE 20 MG PO TABS
20.0000 mg | ORAL_TABLET | Freq: Every day | ORAL | Status: DC
  Administered 2013-09-06 – 2013-09-10 (×5): 20 mg
  Filled 2013-09-05 (×6): qty 1

## 2013-09-05 MED ORDER — ATORVASTATIN CALCIUM 40 MG PO TABS
40.0000 mg | ORAL_TABLET | Freq: Every day | ORAL | Status: DC
  Administered 2013-09-06 – 2013-09-16 (×11): 40 mg via ORAL
  Filled 2013-09-05 (×13): qty 1

## 2013-09-05 MED ORDER — WARFARIN SODIUM 2.5 MG PO TABS
2.5000 mg | ORAL_TABLET | Freq: Once | ORAL | Status: DC
  Filled 2013-09-05: qty 1

## 2013-09-05 MED ORDER — INSULIN ASPART 100 UNIT/ML ~~LOC~~ SOLN
0.0000 [IU] | Freq: Three times a day (TID) | SUBCUTANEOUS | Status: DC
  Administered 2013-09-06 – 2013-09-08 (×4): 1 [IU] via SUBCUTANEOUS
  Administered 2013-09-08: 2 [IU] via SUBCUTANEOUS
  Administered 2013-09-08: 1 [IU] via SUBCUTANEOUS

## 2013-09-05 MED ORDER — ATORVASTATIN CALCIUM 40 MG PO TABS: 40.0000 mg | ORAL_TABLET | Freq: Every day | ORAL | Status: DC

## 2013-09-05 MED ORDER — ALUM & MAG HYDROXIDE-SIMETH 200-200-20 MG/5ML PO SUSP: 30.0000 mL | ORAL | Status: DC | PRN

## 2013-09-05 MED ORDER — AMIODARONE HCL 200 MG PO TABS
200.0000 mg | ORAL_TABLET | Freq: Two times a day (BID) | ORAL | Status: DC
  Administered 2013-09-05 – 2013-09-09 (×9): 200 mg via ORAL
  Filled 2013-09-05 (×12): qty 1

## 2013-09-05 MED ORDER — GUAIFENESIN-DM 100-10 MG/5ML PO SYRP: 5.0000 mL | ORAL_SOLUTION | Freq: Four times a day (QID) | ORAL | Status: DC | PRN

## 2013-09-05 MED ORDER — FLEET ENEMA 7-19 GM/118ML RE ENEM: 1.0000 | ENEMA | Freq: Once | RECTAL | Status: AC | PRN

## 2013-09-05 MED ORDER — ASPIRIN 325 MG PO TABS: 325.0000 mg | ORAL_TABLET | Freq: Every day | ORAL | Status: DC

## 2013-09-05 MED ORDER — CHLORHEXIDINE GLUCONATE 0.12 % MT SOLN
15.0000 mL | Freq: Two times a day (BID) | OROMUCOSAL | Status: DC
  Administered 2013-09-05 – 2013-09-17 (×24): 15 mL via OROMUCOSAL
  Filled 2013-09-05 (×26): qty 15

## 2013-09-05 MED ORDER — CHLORHEXIDINE GLUCONATE 0.12 % MT SOLN
15.0000 mL | Freq: Two times a day (BID) | OROMUCOSAL | Status: DC
  Administered 2013-09-05: 15 mL via OROMUCOSAL
  Filled 2013-09-05 (×4): qty 15

## 2013-09-05 MED ORDER — PROCHLORPERAZINE MALEATE 5 MG PO TABS
5.0000 mg | ORAL_TABLET | Freq: Four times a day (QID) | ORAL | Status: DC | PRN
  Filled 2013-09-05: qty 2

## 2013-09-05 NOTE — Progress Notes (Signed)
Physical Therapy Treatment Patient Details Name: [name] MRN: [id number] DOB: [birth year] Today's Date: 09/05/2013 Time: 4098-1191 PT Time Calculation (min): 33 min  PT Assessment / Plan / Recommendation  History of Present Illness 79 y.o. female admitted to [facility] on 08/29/13 with acute onset aphasia and per H&P left sided weakness.  Also, of note, she also has a UTI.  MRI (+) LT MCA acute infarct    PT Comments   Limited session due to urine incontinence and pt fatigued quickly with multiple sit to stands during pericare.  Pt needed total (A) to complete pericare. Plans to inpatient rehab today and RN calling report after PT entered room. Pt transferred to w/c for transportation to rehab.   Follow Up Recommendations  CIR     Equipment Recommendations  None recommended by PT    Recommendations for Other Services Rehab consult  Frequency Min 4X/week   Progress towards PT Goals Progress towards PT goals: Progressing toward goals  Plan Current plan remains appropriate    Precautions / Restrictions Precautions Precautions: Fall Precaution Comments: pt with both urinary and bowel incontinence   Pertinent Vitals/Pain No c/o pain    Mobility  Bed Mobility Bed Mobility: Supine to Sit Supine to Sit: 4: Min assist;HOB elevated;With rails Details for Bed Mobility Assistance: (A) to elevate trunk with cues for technique and hand placement Transfers Transfers: Sit to Stand;Stand to Sit Sit to Stand: 3: Mod assist;From bed;From chair/3-in-1 Stand to Sit: 3: Mod assist;To bed;To chair/3-in-1 Stand Pivot Transfers: 3: Mod assist Details for Transfer Assistance: (A) to initiate transfer and slowly descend with cues for hand placement.  (A) to maintain balance during transfer with cues for LE placement.  Ambulation/Gait Ambulation/Gait Assistance: Not tested (comment) Modified Rankin (Stroke Patients Only) Pre-Morbid Rankin Score: Moderately severe disability Modified Rankin: Moderately  severe disability    Exercises     PT Diagnosis:    PT Problem List:   PT Treatment Interventions:     PT Goals (current goals can now be found in the care plan section) Acute Rehab PT Goals Patient Stated Goal: did not set PT Goal Formulation: With patient/family Time For Goal Achievement: 09/13/13 Potential to Achieve Goals: Good  Visit Information  Last PT Received On: 09/05/13 Assistance Needed: +1 History of Present Illness: 79 y.o. female admitted to [facility] on 08/29/13 with acute onset aphasia and per H&P left sided weakness.  Also, of note, she also has a UTI.  MRI (+) LT MCA acute infarct     Subjective Data  Subjective: Pt with much improved speech and pt was asking question about inpatient rehab Patient Stated Goal: did not set   Cognition  Cognition Arousal/Alertness: Awake/alert Behavior During Therapy: WFL for tasks assessed/performed Overall Cognitive Status: History of cognitive impairments - at baseline    Balance  Static Sitting Balance Static Sitting - Balance Support: Feet supported;No upper extremity supported Static Sitting - Level of Assistance: 5: Stand by assistance Static Standing Balance Static Standing - Balance Support: Bilateral upper extremity supported;During functional activity Static Standing - Level of Assistance: 2: Max assist;Other (comment) Static Standing - Comment/# of Minutes: (A) to maintain balance while RN staff performed pericare.  Pt needed total (A) with all pericare due to urine incontinence  End of Session PT - End of Session Equipment Utilized During Treatment: Gait belt Activity Tolerance: Patient limited by fatigue Patient left: in chair;with call bell/phone within reach;with family/visitor present (Pt in w/c for possible d/c to inpatient rehab) Nurse Communication:  Mobility status   GP     [name] 09/05/2013, 4:47 PM  Jake Shark, PT DPT [phone]

## 2013-09-05 NOTE — Consult Note (Signed)
I have reviewed and discussed the care of this patient in detail with the nurse practitioner including pertinent patient records, physical exam findings and data. I agree with details of this encounter.  

## 2013-09-05 NOTE — Progress Notes (Signed)
Family Medicine Teaching Service Daily Progress Note Intern Pager: [phone]  Patient name: [name] Medical record number: [id number] Date of birth: [birth year] Age: 79 y.o. Gender: female  Primary Care Provider: Elvina Sidle, MD Consultants: Neuro Code Status: Full  Pt Overview and Major Events to Date:  9/19:  - MRI/MRA left MCA branch stroke  - Afib with RVR, transfer to stepdown and start dilt drip  9/20: Dilt drip held given normalized rate this AM 9/21: 6:30 AM pt went back into Afib with RVR 130s. Restarted dilt drip.  9/23: NG placed; tube feeds initiated   Assessment and Plan: [name] is a 79 y.o. female presenting with expressive aphasia and left sided weakness that started this afternoon. PMH is significant for HTN, dementia, gastric ca, PUD, and stroke in 2003 (takes ASA 81).   #Neuro: Stroke-small left MCA acute infarct on MRI/MRA. Aphasic and some right sided weakness. - Clinical sx consistent with acute stroke with severe aphasia, not much improved. Carotid doppler 1-39% stenosis bilaterally. - PT/OT/Speech recommending HH and NPO - Troponin neg x 3, mildly pos x 1 to 0.31, neg x 3 yesterday - Neuro rec: ASA 300mg  & Plavix, poor candidate for Anticoag; possible TEE as outpt - GOC discuss: HH vs SNF vs CIR: initiated w/ family will f/u with SW (son upset about palliative discussion)  - PT and Neuro rec CIR (Logue, Eugenia M; [phone])  - Anticipate transfer to CIR today - Failed modified barium swallow 9/22, NG tube placed by IR 9/23: continue to tolerate feeding well  #Atrial fibrillation with RVR - Patient presented with first instance of Afib with RVR - Diltiazem drip stopped 9/21 and 2.5mg  metoprolol IV x 1  - Repeat troponins yesterday neg x 3, TSH wnl - Appreciate cardiology recs: Coumadin, amiodarone 200mg  BID (started 9/23), ASA 300 (Continued) - Continue Lipitor 40mg  qd - Plavix to start 9/24  - Considering Coumadin,hesitate to anticoagulate  given fall risk with dementia - CBC 9/22: anemia Hgb 10.2 (9/24) > 9.4 (9/22) < 11.3 (9/19)  # Sacral ulcer:  - Measurement: 0.5 cm x 1 cm; no drainage  - Continue Allevyn sacral dressing - WOC managing   #Metabolic acidosis: - Bmet 9/24:  Bicarb 20, gap 12. - Fluids stopped 9/25  # Dementia: Has outpatient neurologist. TIA 2003 and pt was on aspirin 81mg . - Mild dementia, taking namenda and aricept at home though currently being held  - Restart when able or consider alternatives  #GU: S/p 3 days ceftriaxone IV for suspected UTI vs non-clean catch - Creatinine 1.27 (9/24) < 1.79 (9/22)  #H/o HTN - Currently only prescribed lasix for leg edema, per family.  - Permissive HTN in setting of acute infarction with >200/100 to call FPTS for low dose hydralazine - BP last 24 hrs 140/50s - HCTZ 6.25 (Started 9/24)  #Left hip tenderness - Recent h/o hip fx surgery but no subsequent fever, skin breakdown, or fall.  - Monitor, possibly due to prolonged lying  #Coccygeal ulcer: Decubitus pressure ulcer present on admission. Nursing placing padded dressings over sacrum - WOC recs appreciated: Continue allevyn sacral dressing, change every 3 days and PRN for soilage.   FEN/GI: NPO per speech s/p MCA stroke; Family is considering NG tube for meds/feeds - Fluids:Stopped on 9/25 (K 3.2 on 9/24) - Kdur 40mg  PO x 1 - Tubes feeds: Jevity @ 45 cc/hr Currently underweight (BMI 16.9); Nutrition following PPx: SQ heparin  Disposition: CIR soon - Outpatient PET scan with Dr Vassie Loll  for lung nodules  Subjective: Pt sleeping but easily awoken. No apparent complaints, speech continue to improve slowly; She denies CP, SOB. Son in present in room with her and both her and son are agreeable to CIR.   Objective: Temp:  [98 F (36.7 C)-98.7 F (37.1 C)] 98.4 F (36.9 C) (09/25 0700) Pulse Rate:  [57-60] 58 (09/25 0400) Resp:  [13-18] 13 (09/25 0400) BP: (138-152)/(42-69) 141/53 mmHg (09/25 0400) SpO2:  [97  %-100 %] 100 % (09/25 0400) Weight:  [97 lb (44 kg)] 97 lb (44 kg) (09/25 0500) Physical Exam: General: NAD, lying in bed, Cardiovascular: RRR, normal heart sounds, no murmurs Respiratory: CTAB, normal effort Extremities: no LLE, no calf tenderness Neuro: A&Ox4. speaking in sentences, smiles, interacts appropriately, follows commands  Laboratory:  Recent Labs Lab 08/30/13 0240 09/02/13 0924 09/04/13 1130  WBC 10.4 7.0 5.6  HGB 11.3* 9.4* 10.2*  HCT 34.1* 30.1* 31.8*  PLT 215 202 194    Recent Labs Lab 08/29/13 1357  08/30/13 0240 09/01/13 2040 09/02/13 0924 09/04/13 1130  NA 137  < > 141 147* 146* 142  K 3.8  < > 3.7 3.7 3.5 3.2*  CL 100  < > 107 114* 114* 110  CO2 24  --  20 15* 19 20  BUN 41*  < > 37* 29* 29* 18  CREATININE 1.73*  < > 1.60* 1.74* 1.79* 1.27*  CALCIUM 9.1  --  8.3* 9.0 9.3 8.3*  PROT 6.9  --  6.2  --   --   --   BILITOT 0.3  --  0.2*  --   --   --   ALKPHOS 115  --  102  --   --   --   ALT 12  --  11  --   --   --   AST 22  --  18  --   --   --   GLUCOSE 109*  < > 104* 87 117* 183*  < > = values in this interval not displayed.    Recent Labs Lab 08/31/13 0900 08/31/13 1725 09/01/13 1131 09/01/13 2040 09/02/13 0145  TROPONINI 0.31* <0.30 <0.30 <0.30 <0.30   Imaging/Diagnostic Tests: DG Naso tube IMPRESSION: 9/23 Feeding tube tip in the proximal 4th portion of the duodenum.  CXR IMPRESSION: 9/18 No evidence of acute cardiopulmonary disease.  MRI HEAD IMPRESSION 9/18 1. Small/patchy left MCA acute infarct. No mass effect or  hemorrhage.  2. Otherwise stable MRI appearance of the brain since 2009.  3. Increased chronic left mastoid effusion. Mild paranasal sinus  inflammation.  MRA HEAD IMPRESSION 9/18  No major left MCA branch occlusion or stenosis. Negative for age  intracranial MRA.  Wenda Low, MD 09/05/2013, 8:29 AM PGY-1, [facility] Family Medicine FPTS Intern pager: [phone], text pages welcome

## 2013-09-05 NOTE — H&P (Signed)
Physical Medicine and Rehabilitation Admission H&P  Chief Complaint   Patient presents with   .  Speech difficulties, dysphagia, RUE weakness.   :  HPI: [name] is a 79 y.o. RH-female with a history of hypertension, dementia, stomach cancer and peptic ulcer disease, presenting with new onset of inability to speak. Patient reportedly was inattentive to those around her and attempted to speak but could not utter any sounds and was aware that she could not speak. CT scan of her head showed no acute intracranial abnormality. Her ability to speak improved markedly while being evaluated in the emergency room. MRI/MRA brain done revealing small/patchy left MCA infarct and no stenosis. 2D echo with EF 65-70% and grade 1 diastolic dysfunction. Neurology recommends ASA and Plavix for thrombotic infarct due to SVD. Swallow evaluation done and NPO recommended. Panda placed yesterday for nutritional support.  She was noted to be in A Fib but developed bradycardia on IV Cardizem. Dr. Johney Frame following for input and recommended amiodarone, avoiding PPM due to advanced age and coumadin for secondary stroke prevention. MTSB recommending discussion with family regarding pro/cons of anticoagulation. Blood pressures labile and HCTZ added today for better control. Therapies ongoing and team recommending CIR for progression. Speech intelligibility improving and working on dysphagia exercises--recommending hold off on PEG.    patient states she feels fine and is ready to go home  Review of Systems - partially obtained from son since pt is unreliable for personal info HENT: Negative for hearing loss.    Eyes: Negative for blurred vision.  Respiratory: Negative for shortness of breath.  Cardiovascular: Negative for chest pain.  Gastrointestinal: Negative for heartburn.  Musculoskeletal:  R shoulder pain and limitation Neurological: Positive for speech change and weakness. Negative for headaches.    Past Medical  History   Diagnosis  Date   .  Dementia    .  Hypertension    .  Ulcer      Hx of   .  Stomach cancer    .  Snoring    .  Fracture, intertrochanteric, left femur  03/25/2012   .  Fracture, intertrochanteric, right femur  06/2008     s/p closed reduction and internal fixation   .  Nodule of left lung      followed by Dr. Vassie Loll // LLL spiculated nodule noted per CT in 2012 1.5x1.6x2.4, PET in 06/03/2011 was low uptake.    Past Surgical History   Procedure  Laterality  Date   .  Stomach surgery       3/4 of stomach removed due to stomach cancer   .  Hip surgery   2009     right   .  Total hip arthroplasty   03/29/2012     Procedure: TOTAL HIP ARTHROPLASTY; Surgeon: Eulas Post, MD; Location: [facility] OR; Service: Orthopedics; Laterality: Left; Left Hip Hemiarthroplasty    Family History   Problem  Relation  Age of Onset   .  Cancer  Father    .  Liver cancer  Son    .  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 Lives with son who provides 24 hrs supervision and SBA for all mobility. Son does all housework. Per reports that she quit smoking about 39 years ago. Her smoking use included Cigarettes. She has a .6 pack-year smoking history. She has never used smokeless tobacco. She reports that drinks alcohol. Her drug history is not on file.  Allergies:  No Known Allergies  Medications Prior to Admission   Medication  Sig  Dispense  Refill   .  calcitRIOL (ROCALTROL) 0.25 MCG capsule  Take 0.25 mcg by mouth daily.     Marland Kitchen  donepezil (ARICEPT) 10 MG tablet  Take 10 mg by mouth daily.     .  ferrous fumarate (HEMOCYTE - 106 MG FE) 325 (106 FE) MG TABS tablet  Take 1 tablet by mouth daily.     .  furosemide (LASIX) 20 MG tablet  Take 20 mg by mouth every morning.     .  memantine (NAMENDA) 10 MG tablet  Take 10 mg by mouth 2 (two) times daily.     .  Multiple Vitamins-Minerals (EQL GUMMY ADULT PO)  Take 1 each by mouth daily.     .  sodium bicarbonate 650 MG tablet  Take 650 mg by  mouth 4 (four) times daily.     .  vitamin B-12 (CYANOCOBALAMIN) 1000 MCG tablet  Take 1,000 mcg by mouth every Monday, Wednesday, and Friday.      Home:  Home Living  Family/patient expects to be discharged to:: Private residence  Living Arrangements: Children (son Nida Boatman )  Available Help at Discharge: Family;Available 24 hours/day  Type of Home: House  Home Access: Ramped entrance  Home Layout: Two level;Able to live on main level with bedroom/bathroom  Home Equipment: Dan Humphreys - 2 wheels;Cane - single point;Shower seat  Functional History:  Prior Function  Comments: Son reports they were suppose to drive to [place] and [employer] for a trip next week.  Functional Status:  Mobility:  Bed Mobility  Bed Mobility: Supine to Sit  Supine to Sit: 3: Mod assist;HOB elevated  Sitting - Scoot to Edge of Bed: 3: Mod assist  Transfers  Transfers: Sit to Stand;Stand to Sit  Sit to Stand: 3: Mod assist;From bed  Stand to Sit: 3: Mod assist;To chair/3-in-1  Ambulation/Gait  Ambulation/Gait Assistance: 4: Min assist  Ambulation Distance (Feet): 150 Feet  Assistive device: Rolling walker  Ambulation/Gait Assistance Details: minA to facilitate bilat hip extension and amb inside walker instead of pushing walker far infront of pt  Gait Pattern: Step-through pattern;Shuffle;Trunk flexed;Narrow base of support  Gait velocity: slow  General Gait Details: 1 standing rest break, max encouragement to finished amb to room  Stairs: No   ADL:  ADL  Grooming: Supervision/safety;Set up  Where Assessed - Grooming: Supported sitting  Lower Body Dressing: Minimal assistance  Where Assessed - Lower Body Dressing: Supported sit to Scientist, research (life sciences): Maximal assistance  Toilet Transfer Method: Archivist: Bedside commode  Equipment Used: Gait belt  Transfers/Ambulation Related to ADLs: Max A. R bias. heavy lean to R. max A to achieve upright midline posture  ADL Comments:  pariticpating in ADL  Cognition:  Cognition  Overall Cognitive Status: History of cognitive impairments - at baseline  Arousal/Alertness: Awake/alert  Orientation Level: Oriented X4  Attention: Focused;Sustained;Selective  Focused Attention: Appears intact  Sustained Attention: Appears intact  Selective Attention: Appears intact  Memory: Impaired  Memory Impairment: Decreased short term memory;Decreased recall of new information  Decreased Short Term Memory: Verbal basic;Functional basic  Awareness: Impaired  Awareness Impairment: Intellectual impairment;Emergent impairment  Problem Solving: Impaired  Problem Solving Impairment: Verbal complex;Functional complex  Executive Function: Reasoning;Sequencing;Self Monitoring  Cognition  Arousal/Alertness: Awake/alert  Behavior During Therapy: WFL for tasks assessed/performed  Overall Cognitive Status: History of cognitive impairments - at baseline  Memory: (responses to  questions are 100% correct with son present )  Difficult to assess due to: Impaired communication  Physical Exam:  Blood pressure 139/91, pulse 58, temperature 97.9 F (36.6 C), temperature source Oral, resp. rate 13, height 5\' 5"  (1.651 m), weight 44 kg (97 lb), SpO2 100.00%.  Physical Exam  Nursing note and vitals reviewed.  Constitutional: She is oriented to person, place, and time. She appears cachectic.  Elderly female  HENT:  Head: Normocephalic and atraumatic.  Panda in right nares  Eyes: Conjunctivae are normal. Pupils are equal, round, and reactive to light.  Neck: Normal range of motion. Neck supple.  Cardiovascular: Normal rate and regular rhythm.  Pulmonary/Chest: Effort normal and breath sounds normal. No respiratory distress.  Abdominal: Soft. Bowel sounds are normal. She exhibits no distension. There is no tenderness.  Musculoskeletal: She exhibits no edema. Right > Left RTC shoulder contracture with poor active abduction, Severe R hallux  valgus Neurological: She is alert and oriented to person, place,not time Speech soft, slow, hesitant with moderately dysarthria. Able to follow simple commands. RUE weakness proximally.  Skin: Skin is warm and dry.  Neuro:  Eyes without evidence of nystagmus  Tone is normal without evidence of spasticity Cerebellar exam shows no evidence of ataxia on finger nose finger Left  side, unableRight secondary to shoulder pain/contracture No evidence of trunkal ataxia  Motor strength is in 3-/5 R deltoid, 5/5 Left deltoid deltoid, 5/5  biceps, triceps, finger flexors and extensors, wrist flexors and extensors, hip flexors, knee flexors and extensors, ankle dorsiflexors, plantar flexors,  toe flexors and extensors  Sensory exam is normal to proprioception and light touch in the upper and lower limbs  Unable to repeat no ifs ,ands, or buts, mild dysarthria with "[facility]"    Results for orders placed during the hospital encounter of 08/29/13 (from the past 48 hour(s))   GLUCOSE, CAPILLARY Status: None    Collection Time    09/03/13 8:52 PM   Result  Value  Range    Glucose-Capillary  95  70 - 99 mg/dL    Comment 1  Notify RN     Comment 2  Documented in Chart    GLUCOSE, CAPILLARY Status: Abnormal    Collection Time    09/03/13 11:34 PM   Result  Value  Range    Glucose-Capillary  112 (*)  70 - 99 mg/dL    Comment 1  Notify RN     Comment 2  Documented in Chart    GLUCOSE, CAPILLARY Status: Abnormal    Collection Time    09/04/13 6:30 AM   Result  Value  Range    Glucose-Capillary  152 (*)  70 - 99 mg/dL    Comment 1  Documented in Chart     Comment 2  Notify RN    GLUCOSE, CAPILLARY Status: Abnormal    Collection Time    09/04/13 11:11 AM   Result  Value  Range    Glucose-Capillary  167 (*)  70 - 99 mg/dL    Comment 1  Documented in Chart     Comment 2  Notify RN    CBC Status: Abnormal    Collection Time    09/04/13 11:30 AM   Result  Value  Range    WBC  5.6   4.0 - 10.5 K/uL    RBC  3.33 (*)  3.87 - 5.11 MIL/uL    Hemoglobin  10.2 (*)  12.0 - 15.0 g/dL  HCT  31.8 (*)  36.0 - 46.0 %    MCV  95.5  78.0 - 100.0 fL    MCH  30.6  26.0 - 34.0 pg    MCHC  32.1  30.0 - 36.0 g/dL    RDW  96.0  45.4 - 09.8 %    Platelets  194  150 - 400 K/uL   BASIC METABOLIC PANEL Status: Abnormal    Collection Time    09/04/13 11:30 AM   Result  Value  Range    Sodium  142  135 - 145 mEq/L    Potassium  3.2 (*)  3.5 - 5.1 mEq/L    Chloride  110  96 - 112 mEq/L    CO2  20  19 - 32 mEq/L    Glucose, Bld  183 (*)  70 - 99 mg/dL    BUN  18  6 - 23 mg/dL    Creatinine, Ser  1.19 (*)  0.50 - 1.10 mg/dL    Calcium  8.3 (*)  8.4 - 10.5 mg/dL    GFR calc non Af Amer  37 (*)  >90 mL/min    GFR calc Af Amer  43 (*)  >90 mL/min    Comment:  (NOTE)     The eGFR has been calculated using the CKD EPI equation.     This calculation has not been validated in all clinical situations.     eGFR's persistently <90 mL/min signify possible Chronic Kidney     Disease.   GLUCOSE, CAPILLARY Status: Abnormal    Collection Time    09/04/13 6:20 PM   Result  Value  Range    Glucose-Capillary  164 (*)  70 - 99 mg/dL   GLUCOSE, CAPILLARY Status: Abnormal    Collection Time    09/05/13 12:01 AM   Result  Value  Range    Glucose-Capillary  172 (*)  70 - 99 mg/dL    Comment 1  Notify RN     Comment 2  Documented in Chart    GLUCOSE, CAPILLARY Status: Abnormal    Collection Time    09/05/13 5:52 AM   Result  Value  Range    Glucose-Capillary  172 (*)  70 - 99 mg/dL    Comment 1  Notify RN     Comment 2  Documented in Chart    GLUCOSE, CAPILLARY Status: Abnormal    Collection Time    09/05/13 7:22 AM   Result  Value  Range    Glucose-Capillary  147 (*)  70 - 99 mg/dL    Comment 1  Documented in Chart     Comment 2  Notify RN    GLUCOSE, CAPILLARY Status: Abnormal    Collection Time    09/05/13 11:02 AM   Result  Value  Range    Glucose-Capillary  150 (*)  70 - 99 mg/dL     Dg Abd 1 View  1/47/8295 CLINICAL DATA: Placement of nasogastric tube at the bedside by the radiologic technologist. EXAM: ABDOMEN - 1 VIEW; DG NASO G TUBE PLC W/FL-NO RAD COMPARISON: None. FINDINGS: AP image of the upper abdomen demonstrates a feeding tube which has its tip in the proximal 4th portion of the duodenum. This was confirmed with a hand injection of 40 cc Omnipaque 300 by the radiologic technologist. The technologist documented 3 minutes and 21 seconds of fluoroscopy time. IMPRESSION: Feeding tube tip in the proximal 4th portion of the duodenum. Electronically Signed By:  Hulan Saas On: 09/03/2013 17:44  Dg Vangie Bicker G Tube Plc W/fl-no Rad  09/03/2013 CLINICAL DATA: Placement of nasogastric tube at the bedside by the radiologic technologist. EXAM: ABDOMEN - 1 VIEW; DG NASO G TUBE PLC W/FL-NO RAD COMPARISON: None. FINDINGS: AP image of the upper abdomen demonstrates a feeding tube which has its tip in the proximal 4th portion of the duodenum. This was confirmed with a hand injection of 40 cc Omnipaque 300 by the radiologic technologist. The technologist documented 3 minutes and 21 seconds of fluoroscopy time. IMPRESSION: Feeding tube tip in the proximal 4th portion of the duodenum. Electronically Signed By: Hulan Saas On: 09/03/2013 17:44   Post Admission Physician Evaluation:  1. Functional deficits secondary to Left MCA infarct. 2. Patient is admitted to receive collaborative, interdisciplinary care between the physiatrist, rehab nursing staff, and therapy team. 3. Patient's level of medical complexity and substantial therapy needs in context of that medical necessity cannot be provided at a lesser intensity of care such as a SNF. 4. Patient has experienced substantial functional loss from his/her baseline which was documented above under the "Functional History" and "Functional Status" headings. Judging by the patient's diagnosis, physical exam, and functional history, the patient has  potential for functional progress which will result in measurable gains while on inpatient rehab. These gains will be of substantial and practical use upon discharge in facilitating mobility and self-care at the household level. 5. Physiatrist will provide 24 hour management of medical needs as well as oversight of the therapy plan/treatment and provide guidance as appropriate regarding the interaction of the two. 6. 24 hour rehab nursing will assist with bladder management, bowel management, safety, skin/wound care, disease management, medication administration, pain management, patient education and pt was inc at home and help integrate therapy concepts, techniques,education, etc. 7. PT will assess and treat for/with: pre gait, gait training, endurance , safety, equipment, neuromuscular re education. Goals are: Sup mob. 8. OT will assess and treat for/with: ADLs, Cognitive perceptual skills, Neuromuscular re education, safety, endurance, equipment. Goals are: Sup/Min A ADLs. 9. SLP will assess and treat for/with: Dysphagia,aphasia. Goals are: initiate po intake. 10. Case Management and Social Worker will assess and treat for psychological issues and discharge planning. 11. Team conference will be held weekly to assess progress toward goals and to determine barriers to discharge. 12. Patient will receive at least 3 hours of therapy per day at least 5 days per week. 13. ELOS: 2wks  14. Prognosis: good return to baseline Medical Problem List and Plan:  1. DVT Prophylaxis/Anticoagulation: Pharmaceutical: Coumadin  2. Pain Management: N/A.  3. Mood: No signs of distress. Has supportive family. Will have LCSW follow for evaluation.  4. Neuropsych: This patient is intermittently capable of making decisions on her own behalf.  5. Severe dysphagia: Continue TF and water flushes. Resume lasix and monitor renal status closely.  6. H/o PUD/stomach cancer: add PPI  7. CKD: Baseline BUN/Cr--41/1.73. Resume  sodium bicarb and rocaltrol.   Erick Colace M.D. [facility] FAAPM&R (Sports Med, Neuromuscular Med) Diplomate Am Board of Electrodiagnostic Med Diplomate Am Board of Pain Medicine Fellow Am Board of Interventional Pain Physicians  09/05/2013

## 2013-09-05 NOTE — Progress Notes (Signed)
Patient has been discharged to our inpatient rehab unit via wheelchair. Phone report has been given to Ed,rn. Family members aware of the move and are following over.

## 2013-09-05 NOTE — Discharge Summary (Signed)
Attending Addendum  I examined the patient and discussed the discharge plan with Dr. Joyner. I have reviewed the note and agree.    [name], MD FAMILY MEDICINE TEACHING SERVICE   

## 2013-09-05 NOTE — Progress Notes (Addendum)
NUTRITION FOLLOW UP  DOCUMENTATION CODES Per approved criteria  -Underweight   Intervention:    Continue current EN regimen RD to follow for nutrition care plan  Nutrition Dx:   Inadequate oral intake related to inability to eat as evidenced by NPO status, ongoing  Goal:   EN to meet > 90% of estimated nutrition needs, met  Monitor:   EN regimen & tolerance, weight, labs, I/O's  Assessment:   Patient with PMH significant for HTN, dementia, gastric cancer, PUD, and stroke in 2003; presented with expressive aphasia and left sided weakness; probable left MCA subcortical TIA.  Jevity 1.2 formula infusing at goal rate of 45 ml/hr via small bore NGT (tip in duodenum) providing 1296 total kcals, 60 gm protein, 872 ml of free water. Patient with no complaints and tolerating well.  Palliative Care Team & Speech Path notes reviewed.  Transfer to IP Rehab anticipated today.  Height: Ht Readings from Last 1 Encounters:  08/30/13 5\' 5"  (1.651 m)    Weight Status:   Wt Readings from Last 1 Encounters:  09/05/13 97 lb (44 kg)    Re-estimated needs:  Kcal: 1200-1400 Protein: 50-60 gm Fluid: >/= 1.5 L  Skin: pressure ulcer to sacrum  Diet Order: NPO   Intake/Output Summary (Last 24 hours) at 09/05/13 1213 Last data filed at 09/05/13 0700  Gross per 24 hour  Intake   1535 ml  Output      0 ml  Net   1535 ml    Labs:   Recent Labs Lab 09/01/13 2040 09/02/13 0924 09/04/13 1130  NA 147* 146* 142  K 3.7 3.5 3.2*  CL 114* 114* 110  CO2 15* 19 20  BUN 29* 29* 18  CREATININE 1.74* 1.79* 1.27*  CALCIUM 9.0 9.3 8.3*  GLUCOSE 87 117* 183*    CBG (last 3)   Recent Labs  09/05/13 0552 09/05/13 0722 09/05/13 1102  GLUCAP 172* 147* 150*    Scheduled Meds: . amiodarone  200 mg Oral BID  . antiseptic oral rinse  15 mL Mouth Rinse q12n4p  . aspirin  325 mg Oral Daily  . atorvastatin  40 mg Oral q1800  . chlorhexidine  15 mL Mouth Rinse BID  . clopidogrel  75 mg  Oral Q breakfast  . heparin  5,000 Units Subcutaneous Q8H  . hydrochlorothiazide  6.25 mg Oral Daily    Continuous Infusions: . feeding supplement (JEVITY 1.2 CAL) 1,000 mL (09/05/13 0702)    Maureen Chatters, RD, LDN Pager #: [phone] After-Hours Pager #: [phone]

## 2013-09-05 NOTE — Progress Notes (Signed)
ANTICOAGULATION CONSULT NOTE - Initial Consult  Pharmacy Consult for Warfarin Indication: atrial fibrillation  No Known Allergies  Patient Measurements: Height: 5\' 5"  (165.1 cm) Weight: 97 lb (44 kg) IBW/kg (Calculated) : 57  Vital Signs: Temp: 97.9 F (36.6 C) (09/25 1106) Temp src: Oral (09/25 1106) BP: 139/91 mmHg (09/25 0700) Pulse Rate: 58 (09/25 0400)  Labs:  Recent Labs  09/04/13 1130  HGB 10.2*  HCT 31.8*  PLT 194  CREATININE 1.27*    Estimated Creatinine Clearance: 22.5 ml/min (by C-G formula based on Cr of 1.27).   Medical History: Past Medical History  Diagnosis Date  . Dementia   . Hypertension   . Ulcer     Hx of  . Stomach cancer   . Snoring   . Fracture, intertrochanteric, left femur 03/25/2012  . Fracture, intertrochanteric, right femur 06/2008    s/p closed reduction and internal fixation  . Nodule of left lung     followed by Dr. Vassie Loll // LLL spiculated nodule noted per CT in 2012 1.5x1.6x2.4, PET in 06/03/2011 was low uptake.   Assessment: 79 y/o F with new onset afib to start warfarin per pharmacy. Baseline INR is 0.99, Hgb 10.2, Scr 1.27 with CrCl ~ 20-25. No overt bleeding noted. Noted DDI with amiodarone.   Goal of Therapy:  INR 2-3 Monitor platelets by anticoagulation protocol: Yes   Plan:  -Warfarin 2.5 mg PO x 1 -Daily PT/INR -Monitor for bleeding -DC UFH 5000 units [place] q8h after INR is >2 -Consider changing ASA 325 mg to 81mg   Thank you for allowing me to take part in this patient's care,  Abran Duke, PharmD Clinical Pharmacist Phone: [phone] Pager: [phone] 09/05/2013 12:46 PM

## 2013-09-05 NOTE — Progress Notes (Signed)
Rehab admissions - Evaluated for possible admission.  I met with patient and her son, [name], this am.  Both are agreeable to inpatient rehab admission prior to home with son.  Bed available and can admit to inpatient rehab today.  Call me for questions.  #[phone]

## 2013-09-05 NOTE — Progress Notes (Signed)
Utilization review completed.  

## 2013-09-05 NOTE — Progress Notes (Signed)
Attending Addendum  I examined the patient and discussed the assessment and plan with Dr. Gayla Doss. I have reviewed the note and agree.  Patient is comfortable this morning. Her speech is audible. She denies chest pain or palpitations. Her heart is in regular rate and rhythm. The patient was able to walk the halls with her daughters comfortably. There is no reported weakness or ataxia. She has been alert appropriate to my exam. For these reasons I believe it is appropriate to this is her therapy from aspirin and Plavix to Coumadin for secondary prevention of stroke in the setting of A. fib. We will consult pharmacy to dose Coumadin. She is medically stable and ready for transfer to inpatient rehabilitation with continued Coumadin adjustments per pharmacy.    Dessa Phi, MD FAMILY MEDICINE TEACHING SERVICE

## 2013-09-05 NOTE — PMR Pre-admission (Signed)
PMR Admission Coordinator Pre-Admission Assessment  Patient: [name] is an 79 y.o., female MRN: [id number] DOB: Jan 18, 1926 Height: 5\' 5"  (165.1 cm) Weight: 44 kg (97 lb)              Insurance Information HMO:     PPO:       PCP:       IPA:       80/20:       OTHER:   PRIMARY: Medicare A/B      Policy#: [id number]      Subscriber: Doralee Albino CM Name:        Phone#:       Fax#:   Pre-Cert#:        Employer: Retired Benefits:  Phone #:       Name: Armed forces technical officer. Date: 07/12/93      Deduct: $1216      Out of Pocket Max: none      Life Max: unlimited CIR: 100%      SNF: 100 days Outpatient: 80%     Co-Pay: 20% Home Health: 100%      Co-Pay: none DME: 80%     Co-Pay: 20% Providers: patient's choice  SECONDARY: [employer] [place]      Policy#: [id number]       Subscriber: Doralee Albino CM Name:        Phone#:       Fax#:   Pre-Cert#:        Employer: Retired Benefits:  Phone #: [phone]     Name:   Eff. Date:       Deduct:        Out of Pocket Max:        Life Max:   CIR:        SNF:   Outpatient:       Co-Pay:   Home Health:        Co-Pay:   DME:       Co-Pay:    Emergency Contact Information Contact Information   Name Relation Home Work Baring Son [phone]     Current Medical History  Patient Admitting Diagnosis:  L MCA infarct   History of Present Illness: An 79 y.o. RH-female with a history of hypertension, dementia, stomach cancer and peptic ulcer disease, presenting with new onset of inability to speak. Patient reportedly was inattentive to those around her and attempted to speak but could not utter any sounds and was aware that she could not speak. CT scan of her head showed no acute intracranial abnormality. Her ability to speak improved markedly while being evaluated in the emergency room. MRI/MRA brain done revealing small/patchy left MCA infarct and no stenosis. 2D echo with EF 65-70% and grade 1 diastolic dysfunction. 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 Swallow evaluation done and NPO recommended. Panda placed yesterday for nutritional support.  She was noted to be in A Fib but developed bradycardia on IV Cardizem. Dr. Johney Frame following for input and recommended amiodarone, avoiding PPM due to advanced age and coumadin for secondary stroke prevention. MTSB recommending discussion with family regarding pro/cons of anticoagulation. Blood pressures labile and HCTZ added today for better control. Therapies ongoing and team recommending CIR for progression. Speech intelligibility improving and working on dysphagia exercises--recommending hold off on PEG.      Total: 7=NIH  Past Medical History  Past Medical History  Diagnosis Date  . Dementia   . Hypertension   .  Ulcer     Hx of  . Stomach cancer   . Snoring   . Fracture, intertrochanteric, left femur 03/25/2012  . Fracture, intertrochanteric, right femur 06/2008    s/p closed reduction and internal fixation  . Nodule of left lung     followed by Dr. Vassie Loll // LLL spiculated nodule noted per CT in 2012 1.5x1.6x2.4, PET in 06/03/2011 was low uptake.    Family History  family history includes Allergies in an other family member; Cancer in her father; Liver cancer in her son.  Prior Rehab/Hospitalizations:  Fractured Hip in 2009 and 2013 with 3-4 weeks in SNF rehab.   Current Medications  Current facility-administered medications:acetaminophen (TYLENOL) suppository 650 mg, 650 mg, Rectal, Q4H PRN, Renee A Kuneff, DO;  acetaminophen (TYLENOL) tablet 650 mg, 650 mg, Oral, Q4H PRN, Renee A Kuneff, DO;  amiodarone (PACERONE) tablet 200 mg, 200 mg, Oral, BID, Hillis Range, MD, 200 mg at 09/05/13 0950;  antiseptic oral rinse (BIOTENE) solution 15 mL, 15 mL, Mouth Rinse, q12n4p, Sanjuana Letters, MD aspirin tablet 325 mg, 325 mg, Oral, Daily, Sanjuana Letters, MD, 325 mg at 09/05/13 0950;  atorvastatin (LIPITOR) tablet 40 mg, 40 mg,  Oral, q1800, Renee A Kuneff, DO, 40 mg at 09/04/13 1715;  chlorhexidine (PERIDEX) 0.12 % solution 15 mL, 15 mL, Mouth Rinse, BID, Sanjuana Letters, MD, 15 mL at 09/05/13 0950 feeding supplement (JEVITY 1.2 CAL) liquid 1,000 mL, 1,000 mL, Per Tube, Continuous, Wenda Low, MD, Last Rate: 45 mL/hr at 09/05/13 1200, 1,000 mL at 09/05/13 1200;  free water 225 mL, 225 mL, Per Tube, QID PRN, Wenda Low, MD;  heparin injection 5,000 Units, 5,000 Units, Subcutaneous, Q8H, Renee A Kuneff, DO, 5,000 Units at 09/05/13 2956 hydrALAZINE (APRESOLINE) injection 2 mg, 2 mg, Intravenous, Q4H PRN, Renee A Kuneff, DO, 2 mg at 08/30/13 0014;  hydrochlorothiazide 10 mg/mL oral suspension 6.25 mg, 6.25 mg, Oral, Daily, Anselm Lis, MD, 6.25 mg at 09/05/13 0950;  senna-docusate (Senokot-S) tablet 1 tablet, 1 tablet, Oral, QHS PRN, Renee A Kuneff, DO  Patients Current Diet: NPO  Precautions / Restrictions Precautions Precautions: Fall Precaution Comments: pt with both urinary and bowel incontinence Restrictions Weight Bearing Restrictions: No   Prior Activity Level Community (5-7x/wk): Went out daily.  Home Assistive Devices / Equipment Home Assistive Devices/Equipment: Dan Humphreys (specify type);Shower chair with back;Shower chair without back Home Equipment: Walker - 2 wheels;Cane - single point;Shower seat  Prior Functional Level Prior Function Level of Independence: Needs assistance Gait / Transfers Assistance Needed: min hand held assist for all gait ADL's / Homemaking Assistance Needed: assist needed to get into the shower, but not assist with bathing.  Son does cooking/cleaning.   Communication / Swallowing Assistance Needed: none PTA Comments: Son reports they were suppose to drive to [place] and [employer] for a trip next week.    Current Functional Level Cognition  Arousal/Alertness: Awake/alert Overall Cognitive Status: History of cognitive impairments - at baseline Difficult to assess due to:  Impaired communication Orientation Level: Oriented X4 Attention: Focused;Sustained;Selective Focused Attention: Appears intact Sustained Attention: Appears intact Selective Attention: Appears intact Memory: Impaired Memory Impairment: Decreased short term memory;Decreased recall of new information Decreased Short Term Memory: Verbal basic;Functional basic Awareness: Impaired Awareness Impairment: Intellectual impairment;Emergent impairment Problem Solving: Impaired Problem Solving Impairment: Verbal complex;Functional complex Executive Function: Reasoning;Sequencing;Self Monitoring    Extremity Assessment (includes Sensation/Coordination)          ADLs  Grooming: Supervision/safety;Set up Where Assessed - Grooming: Supported sitting  Lower Body Dressing: Minimal assistance Where Assessed - Lower Body Dressing: Supported sit to stand Toilet Transfer: Maximal assistance Toilet Transfer Method: Stand pivot Toilet Transfer Equipment: Bedside commode Toileting - Clothing Manipulation and Hygiene: Maximal assistance Equipment Used: Gait belt Transfers/Ambulation Related to ADLs: Max A. R bias. heavy lean to R. max A to achieve upright midline posture ADL Comments: pariticpating in ADL    Mobility  Bed Mobility: Supine to Sit Supine to Sit: 3: Mod assist;HOB elevated Sitting - Scoot to Edge of Bed: 3: Mod assist    Transfers  Transfers: Sit to Stand;Stand to Sit Sit to Stand: 3: Mod assist;From bed Stand to Sit: 3: Mod assist;To chair/3-in-1    Ambulation / Gait / Stairs / Psychologist, prison and probation services  Ambulation/Gait Ambulation/Gait Assistance: 4: Min Environmental consultant (Feet): 150 Feet Assistive device: Rolling walker Ambulation/Gait Assistance Details: minA to facilitate bilat hip extension and amb inside walker instead of pushing walker far infront of pt Gait Pattern: Step-through pattern;Shuffle;Trunk flexed;Narrow base of support Gait velocity: slow General Gait  Details: 1 standing rest break, max encouragement to finished amb to room Stairs: No    Posture / Balance Static Sitting Balance Static Sitting - Balance Support: Feet supported;No upper extremity supported Static Sitting - Level of Assistance: Other (comment);5: Stand by assistance (vc for upright. leans R) Static Standing Balance Static Standing - Balance Support: Bilateral upper extremity supported;During functional activity Static Standing - Level of Assistance: 2: Max assist;Other (comment) (during toileting) Dynamic Standing Balance Dynamic Standing - Balance Support: Right upper extremity supported Dynamic Standing - Level of Assistance: 3: Mod assist    Special needs/care consideration BiPAP/CPAP No CPM No Continuous Drip IV No Dialysis No        Life Vest No Oxygen No Special Bed No Trach Size No Wound Vac (area) No     Skin Has a pressure ulcer on coccyx                               Bowel mgmt: Incontinent with last BM 09/04/13 Bladder mgmt: Voiding incontinently Diabetic mgmt No    Previous Home Environment Living Arrangements: Children (son Nida Boatman ) Available Help at Discharge: Family;Available 24 hours/day Type of Home: House Home Layout: Two level;Able to live on main level with bedroom/bathroom Home Access: Ramped entrance Bathroom Shower/Tub: Walk-in shower;Curtain Bathroom Toilet: Standard Home Care Services: No  Discharge Living Setting Plans for Discharge Living Setting: Patient's home;House;Lives with (comment) (Lives with son.) Type of Home at Discharge: House Discharge Home Layout: Multi-level;Able to live on main level with bedroom/bathroom Alternate Level Stairs-Number of Steps: Flight Discharge Home Access: Ramped entrance Does the patient have any problems obtaining your medications?: No  Social/Family/Support Systems Patient Roles: Parent (Has a son and a daughter.) Contact Information: Molly Maduro "Brad" Entwistle - son Anticipated Caregiver: son -  Brad Anticipated Caregiver's Contact Information: Nida Boatman - son - [phone] Ability/Limitations of Caregiver: Son has been the caregiver for his mother for the past 6 years. Caregiver Availability: 24/7 Discharge Plan Discussed with Primary Caregiver: Yes Is Caregiver In Agreement with Plan?: Yes Does Caregiver/Family have Issues with Lodging/Transportation while Pt is in Rehab?: No  Goals/Additional Needs Patient/Family Goal for Rehab: PT/OT S/Mod I, ST S/Min A goals Expected length of stay: 7-10 days Cultural Considerations: None Dietary Needs: NPO with nasogastric tube feedings Equipment Needs: TBD Pt/Family Agrees to Admission and willing to participate: Yes Program Orientation Provided & Reviewed with Pt/Caregiver Including  Roles  & Responsibilities: Yes  Decrease burden of Care through IP rehab admission: N/A  Possible need for SNF placement upon discharge: Not likely  Patient Condition: This patient's medical and functional status has changed since the consult dated: 09/02/13 in which the Rehabilitation Physician determined and documented that the patient's condition is appropriate for intensive rehabilitative care in an inpatient rehabilitation facility. See "History of Present Illness" (above) for medical update. Functional changes are: Currently ambulating 150 ft with min A RW. Patient's medical and functional status update has been discussed with the Rehabilitation physician and patient remains appropriate for inpatient rehabilitation. Will admit to inpatient rehab today.  Preadmission Screen Completed By:  Trish Mage, 09/05/2013 12:42 PM ______________________________________________________________________   Discussed status with Dr.  Wynn Banker on 09/05/13 at  1257 and received telephone approval for admission today.  Admission Coordinator:  Trish Mage, time1257/Date09/25/14

## 2013-09-06 ENCOUNTER — Inpatient Hospital Stay (HOSPITAL_COMMUNITY): Payer: Medicare Other | Admitting: Occupational Therapy

## 2013-09-06 ENCOUNTER — Inpatient Hospital Stay (HOSPITAL_COMMUNITY): Payer: Medicare Other | Admitting: Rehabilitation

## 2013-09-06 ENCOUNTER — Inpatient Hospital Stay (HOSPITAL_COMMUNITY): Payer: Medicare Other | Admitting: Physical Therapy

## 2013-09-06 ENCOUNTER — Inpatient Hospital Stay (HOSPITAL_COMMUNITY): Payer: Medicare Other | Admitting: Speech Pathology

## 2013-09-06 DIAGNOSIS — I69920 Aphasia following unspecified cerebrovascular disease: Secondary | ICD-10-CM

## 2013-09-06 DIAGNOSIS — I6340 Cerebral infarction due to embolism of unspecified cerebral artery: Secondary | ICD-10-CM

## 2013-09-06 DIAGNOSIS — G8110 Spastic hemiplegia affecting unspecified side: Secondary | ICD-10-CM

## 2013-09-06 LAB — CBC WITH DIFFERENTIAL/PLATELET
Basophils Absolute: 0 10*3/uL (ref 0.0–0.1)
Basophils Relative: 0 % (ref 0–1)
HCT: 29.1 % — ABNORMAL LOW (ref 36.0–46.0)
Hemoglobin: 9.4 g/dL — ABNORMAL LOW (ref 12.0–15.0)
Lymphocytes Relative: 9 % — ABNORMAL LOW (ref 12–46)
MCHC: 32.3 g/dL (ref 30.0–36.0)
Monocytes Relative: 7 % (ref 3–12)
Neutro Abs: 5.5 10*3/uL (ref 1.7–7.7)
Neutrophils Relative %: 81 % — ABNORMAL HIGH (ref 43–77)
RBC: 3.02 MIL/uL — ABNORMAL LOW (ref 3.87–5.11)
WBC: 6.8 10*3/uL (ref 4.0–10.5)

## 2013-09-06 LAB — GLUCOSE, CAPILLARY
Glucose-Capillary: 109 mg/dL — ABNORMAL HIGH (ref 70–99)
Glucose-Capillary: 121 mg/dL — ABNORMAL HIGH (ref 70–99)
Glucose-Capillary: 140 mg/dL — ABNORMAL HIGH (ref 70–99)

## 2013-09-06 LAB — COMPREHENSIVE METABOLIC PANEL
AST: 22 U/L (ref 0–37)
Albumin: 2.3 g/dL — ABNORMAL LOW (ref 3.5–5.2)
Alkaline Phosphatase: 102 U/L (ref 39–117)
BUN: 24 mg/dL — ABNORMAL HIGH (ref 6–23)
CO2: 24 mEq/L (ref 19–32)
Chloride: 107 mEq/L (ref 96–112)
Creatinine, Ser: 1.36 mg/dL — ABNORMAL HIGH (ref 0.50–1.10)
GFR calc Af Amer: 40 mL/min — ABNORMAL LOW (ref >90)
GFR calc non Af Amer: 34 mL/min — ABNORMAL LOW (ref >90)
Glucose, Bld: 144 mg/dL — ABNORMAL HIGH (ref 70–99)
Potassium: 4 mEq/L (ref 3.5–5.1)
Total Bilirubin: 0.1 mg/dL — ABNORMAL LOW (ref 0.3–1.2)

## 2013-09-06 LAB — PROTIME-INR
INR: 0.95 (ref 0.00–1.49)
Prothrombin Time: 12.5 seconds (ref 11.6–15.2)

## 2013-09-06 MED ORDER — JEVITY 1.2 CAL PO LIQD
1000.0000 mL | ORAL | Status: DC
  Administered 2013-09-07: 60 mL/h
  Administered 2013-09-08 – 2013-09-09 (×2): 1000 mL
  Filled 2013-09-06 (×8): qty 1000

## 2013-09-06 MED ORDER — WARFARIN SODIUM 2.5 MG PO TABS
2.5000 mg | ORAL_TABLET | Freq: Once | ORAL | Status: AC
  Administered 2013-09-06: 2.5 mg via ORAL
  Filled 2013-09-06: qty 1

## 2013-09-06 NOTE — Progress Notes (Signed)
Subjective/Complaints: No complaints Pleasantly confused  Review of Systems - Not accurate secondary to dementia  Objective: Vital Signs: Blood pressure 151/68, pulse 55, temperature 97.4 F (36.3 C), temperature source Oral, resp. rate 18, weight 41.4 kg (91 lb 4.3 oz), SpO2 96.00%. No results found. Results for orders placed during the hospital encounter of 09/05/13 (from the past 72 hour(s))  GLUCOSE, CAPILLARY     Status: Abnormal   Collection Time    09/05/13  9:01 PM      Result Value Range   Glucose-Capillary 133 (*) 70 - 99 mg/dL   Comment 1 Notify RN    GLUCOSE, CAPILLARY     Status: Abnormal   Collection Time    09/06/13 12:06 AM      Result Value Range   Glucose-Capillary 121 (*) 70 - 99 mg/dL   Comment 1 Notify RN    CBC WITH DIFFERENTIAL     Status: Abnormal   Collection Time    09/06/13  5:00 AM      Result Value Range   WBC 6.8  4.0 - 10.5 K/uL   RBC 3.02 (*) 3.87 - 5.11 MIL/uL   Hemoglobin 9.4 (*) 12.0 - 15.0 g/dL   HCT 16.1 (*) 09.6 - 04.5 %   MCV 96.4  78.0 - 100.0 fL   MCH 31.1  26.0 - 34.0 pg   MCHC 32.3  30.0 - 36.0 g/dL   RDW 40.9  81.1 - 91.4 %   Platelets 183  150 - 400 K/uL   Neutrophils Relative % 81 (*) 43 - 77 %   Neutro Abs 5.5  1.7 - 7.7 K/uL   Lymphocytes Relative 9 (*) 12 - 46 %   Lymphs Abs 0.6 (*) 0.7 - 4.0 K/uL   Monocytes Relative 7  3 - 12 %   Monocytes Absolute 0.5  0.1 - 1.0 K/uL   Eosinophils Relative 2  0 - 5 %   Eosinophils Absolute 0.2  0.0 - 0.7 K/uL   Basophils Relative 0  0 - 1 %   Basophils Absolute 0.0  0.0 - 0.1 K/uL  PROTIME-INR     Status: None   Collection Time    09/06/13  5:00 AM      Result Value Range   Prothrombin Time 12.5  11.6 - 15.2 seconds   INR 0.95  0.00 - 1.49  GLUCOSE, CAPILLARY     Status: Abnormal   Collection Time    09/06/13  7:04 AM      Result Value Range   Glucose-Capillary 141 (*) 70 - 99 mg/dL   Comment 1 Notify RN        Nursing note and vitals reviewed.  Constitutional: She is  oriented to person, place, and time. She appears cachectic.  Elderly female  HENT:  Head: Normocephalic and atraumatic.  Panda in right nares  Eyes: Conjunctivae are normal. Pupils are equal, round, and reactive to light.  Neck: Normal range of motion. Neck supple.  Cardiovascular: Normal rate and regular rhythm.  Pulmonary/Chest: Effort normal and breath sounds normal. No respiratory distress.  Abdominal: Soft. Bowel sounds are normal. She exhibits no distension. There is no tenderness.  Musculoskeletal: She exhibits no edema. Right > Left RTC shoulder contracture with poor active abduction, Severe R hallux valgus Neurological: She is alert and oriented to person, place,not time Speech soft, slow, hesitant with moderately dysarthria. Able to follow simple commands. RUE weakness proximally.  Skin: Skin is warm and dry.  Neuro:  Eyes without evidence of nystagmus  Tone is normal without evidence of spasticity  Cerebellar exam shows no evidence of ataxia on finger nose finger Left side, unableRight secondary to shoulder pain/contracture  No evidence of trunkal ataxia  Motor strength is in 3-/5 R deltoid, 5/5 Left deltoid deltoid, 5/5 biceps, triceps, finger flexors and extensors, wrist flexors and extensors, hip flexors, knee flexors and extensors, ankle dorsiflexors, plantar flexors, toe flexors and extensors      Assessment/Plan: 1. Functional deficits secondary to Left embolic MCA infarct which require 3+ hours per day of interdisciplinary therapy in a comprehensive inpatient rehab setting. Physiatrist is providing close team supervision and 24 hour management of active medical problems listed below. Physiatrist and rehab team continue to assess barriers to discharge/monitor patient progress toward functional and medical goals. FIM:                   Comprehension Comprehension Mode: Auditory Comprehension: 5-Follows basic conversation/direction: With no  assist  Expression Expression Mode: Verbal Expression: 5-Expresses basic needs/ideas: With no assist  Social Interaction Social Interaction: 5-Interacts appropriately 90% of the time - Needs monitoring or encouragement for participation or interaction.  Problem Solving Problem Solving: 2-Solves basic 25 - 49% of the time - needs direction more than half the time to initiate, plan or complete simple activities  Memory Memory: 3-Recognizes or recalls 50 - 74% of the time/requires cueing 25 - 49% of the time  Medical Problem List and Plan:  1. DVT Prophylaxis/Anticoagulation: Pharmaceutical: Coumadin  2. Pain Management: N/A.  3. Mood: No signs of distress. Has supportive family. Will have LCSW follow for evaluation.  4. Neuropsych: This patient is intermittently capable of making decisions on her own behalf.  5. Severe dysphagia: Continue TF and water flushes. Resume lasix and monitor renal status closely.  6. H/o PUD/stomach cancer: add PPI  7. CKD: Baseline BUN/Cr--41/1.73. Resume sodium bicarb and rocaltrol.   LOS (Days) 1 A FACE TO FACE EVALUATION WAS PERFORMED  KIRSTEINS,ANDREW E 09/06/2013, 8:08 AM

## 2013-09-06 NOTE — Progress Notes (Signed)
ANTICOAGULATION CONSULT NOTE - Follow Up Consult  Pharmacy Consult for coumadin Indication: atrial fibrillation  No Known Allergies  Patient Measurements: Weight: 91 lb 4.3 oz (41.4 kg) Heparin Dosing Weight:   Vital Signs: Temp: 97.4 F (36.3 C) (09/26 0622) Temp src: Oral (09/26 0622) BP: 151/68 mmHg (09/26 0622) Pulse Rate: 55 (09/26 0622)  Labs:  Recent Labs  09/04/13 1130 09/06/13 0500  HGB 10.2* 9.4*  HCT 31.8* 29.1*  PLT 194 183  LABPROT  --  12.5  INR  --  0.95  CREATININE 1.27* 1.36*    The CrCl is unknown because both a height and weight (above a minimum accepted value) are required for this calculation.   Medications:  Scheduled:  . amiodarone  200 mg Oral BID  . antiseptic oral rinse  15 mL Mouth Rinse q12n4p  . aspirin EC  325 mg Oral Daily  . atorvastatin  40 mg Oral q1800  . calcitRIOL  0.25 mcg Per Tube Daily  . chlorhexidine  15 mL Mouth Rinse BID  . coumadin book   Does not apply Once  . enoxaparin (LOVENOX) injection  30 mg Subcutaneous Q24H  . furosemide  20 mg Per Tube Daily  . insulin aspart  0-9 Units Subcutaneous TID WC  . pantoprazole sodium  40 mg Per Tube Daily  . sodium bicarbonate  650 mg Per Tube QID  . warfarin   Does not apply Once  . Warfarin - Pharmacist Dosing Inpatient   Does not apply q1800   Infusions:  . feeding supplement (JEVITY 1.2 CAL)      Assessment: 79 yo female with new onset afib is currently on subtherapeutic coumadin.  INR is 0.95; only had one dose of coumadin Mohan Erven far.  Patient is also on amiodarone. Goal of Therapy:  INR 2-3 Monitor platelets by anticoagulation protocol: Yes   Plan:  -Warfarin 2.5 mg PO x 1 since only had one dose Adelle Zachar far and on amiodarone -Daily PT/INR -Monitor for bleeding -DC SQ lovenox 30mg  q24h after INR is >2 -Consider changing ASA 325 mg to 81mg    Tykeisha Peer, Tsz-Yin 09/06/2013,11:30 AM

## 2013-09-06 NOTE — Evaluation (Signed)
Occupational Therapy Assessment and Plan  Patient Details  Name: [name] MRN: [id number] Date of Birth: [birth year]  OT Diagnosis: abnormal posture, cognitive deficits, hemiplegia affecting dominant side and muscle weakness (generalized) Rehab Potential: Rehab Potential: Good ELOS: 2 weeks   Today's Date: 09/06/2013 Time: 7829-5621 Time Calculation (min): 63 min  Problem List:  Patient Active Problem List   Diagnosis Date Noted  . Dysphagia, unspecified(787.20) 09/02/2013  . Dysarthria 09/02/2013  . Palliative care encounter 09/02/2013  . Weakness generalized 09/02/2013  . Atrial fibrillation 09/01/2013  . Stroke, acute, embolic 09/01/2013  . Hypertension 08/29/2013  . Dementia 03/25/2012  . Lung nodule 05/24/2011    Past Medical History:  Past Medical History  Diagnosis Date  . Dementia   . Hypertension   . Ulcer     Hx of  . Stomach cancer   . Snoring   . Fracture, intertrochanteric, left femur 03/25/2012  . Fracture, intertrochanteric, right femur 06/2008    s/p closed reduction and internal fixation  . Nodule of left lung     followed by Dr. Vassie Loll // LLL spiculated nodule noted per CT in 2012 1.5x1.6x2.4, PET in 06/03/2011 was low uptake.   Past Surgical History:  Past Surgical History  Procedure Laterality Date  . Stomach surgery      3/4 of stomach removed due to stomach cancer  . Hip surgery  2009    right  . Total hip arthroplasty  03/29/2012    Procedure: TOTAL HIP ARTHROPLASTY;  Surgeon: Eulas Post, MD;  Location: [facility] OR;  Service: Orthopedics;  Laterality: Left;  Left Hip Hemiarthroplasty    Assessment & Plan Clinical Impression: Patient is a 79 y.o. right handed female with a history of hypertension, dementia, stomach cancer and peptic ulcer disease, presenting with new onset of inability to speak. Patient reportedly was inattentive to those around her and attempted to speak but could not utter any sounds and was aware that she could not speak. CT  scan of her head showed no acute intracranial abnormality. Her ability to speak improved markedly while being evaluated in the emergency room. MRI/MRA brain done revealing small/patchy left MCA infarct and no stenosis. 2D echo with EF 65-70% and grade 1 diastolic dysfunction. Neurology recommends ASA and Plavix for thrombotic infarct due to SVD. Swallow evaluation done and NPO recommended. Panda placed yesterday for nutritional support.  She was noted to be in A Fib but developed bradycardia on IV Cardizem. Dr. Johney Frame following for input and recommended amiodarone, avoiding PPM due to advanced age and coumadin for secondary stroke prevention. MTSB recommending discussion with family regarding pro/cons of anticoagulation. Blood pressures labile and HCTZ added today for better control. Therapies ongoing and team recommending CIR for progression. Speech intelligibility improving and working on dysphagia exercises--recommending hold off on PEG.   Patient transferred to CIR on 09/05/2013 .    Patient currently requires total with basic self-care skills secondary to muscle weakness, abnormal tone, unbalanced muscle activation and decreased coordination, decreased awareness, decreased problem solving and decreased memory and decreased standing balance, decreased postural control and hemiplegia.  Prior to hospitalization, patient could complete ADLs with occasional min assist - supervision.  Patient will benefit from skilled intervention to decrease level of assist with basic self-care skills prior to discharge home with care partner.  Anticipate patient will require 24 hour supervision and minimal physical assistance and follow up home health.  OT - End of Session Activity Tolerance: Tolerates 30+ min activity without fatigue Endurance Deficit:  No OT Assessment Rehab Potential: Good OT Patient demonstrates impairments in the following area(s): Balance;Cognition;Motor OT Basic ADL's Functional Problem(s):  Grooming;Bathing;Dressing;Toileting OT Transfers Functional Problem(s): Toilet;Tub/Shower OT Additional Impairment(s): Fuctional Use of Upper Extremity OT Plan OT Intensity: Minimum of 1-2 x/day, 45 to 90 minutes OT Frequency: 5 out of 7 days OT Duration/Estimated Length of Stay: 2 weeks OT Treatment/Interventions: Balance/vestibular training;Cognitive remediation/compensation;Discharge planning;DME/adaptive equipment instruction;Functional mobility training;Neuromuscular re-education;Pain management;Patient/family education;Psychosocial support;Self Care/advanced ADL retraining;Skin care/wound managment;Therapeutic Activities;Therapeutic Exercise;UE/LE Strength taining/ROM;UE/LE Coordination activities OT Basic Self-Care Anticipated Outcome(s): min assist OT Toileting Anticipated Outcome(s): min assist OT Bathroom Transfers Anticipated Outcome(s): min assist OT Recommendation Patient destination: Home Follow Up Recommendations: Home health OT Equipment Recommended: None recommended by OT Equipment Details: son willing to make modifications as needed to assist pt   Skilled Therapeutic Intervention OT eval initiated, ADL assessment completed at sit > stand level at sink.  Pt in bed upon arrival, easily aroused.  Engaged in bed mobility, noted pt to be incontinent of bladder and completed hygiene at bed level prior to transferring OOB.  Mod assist supine to sit secondary to RUE weakness.  Mod assist stand pivot bed > w/c with pt with decreased knee extension and decreased midline orientation.  Pt's son present during first half of session, able to answer questions about home setup and PLOF.  Pt with decreased active Rt shoulder flexion and decreased coordination with bathing and dressing, however attempted to integrate into all tasks.  RUE PROM WFL, however shoulder flexion approx 30 degrees.  Mod-max assist sit > stand for LB dressing with pt requiring total assist.  Pt left in w/c with son  present.  Discussed pur  OT Evaluation Precautions/Restrictions  Precautions Precautions: Fall Precaution Comments: pt with both urinary and bowel incontinence, Panda in Rt nare Restrictions Weight Bearing Restrictions: No General   Vital Signs Therapy Vitals Temp: 97.4 F (36.3 C) Temp src: Oral Pulse Rate: 55 Resp: 18 BP: 151/68 mmHg Patient Position, if appropriate: Lying Oxygen Therapy SpO2: 96 % O2 Device: None (Room air) Pain Pain Assessment Pain Assessment: No/denies pain Home Living/Prior Functioning Home Living Available Help at Discharge: Family;Available 24 hours/day Type of Home: House Home Access: Ramped entrance Home Layout: Two level;Able to live on main level with bedroom/bathroom Additional Comments: pt's son reports she would walk around the home with hand held assist or SBA with out AD, but would use RW when outside or on driveway for longer distances/uneven surfaces  Lives With: Son IADL History Homemaking Responsibilities: No Prior Function Level of Independence: Needs assistance with gait;Needs assistance with homemaking;Needs assistance with ADLs ADL  See FIM Vision/Perception  Vision - History Baseline Vision: No visual deficits Patient Visual Report: No change from baseline Vision - Assessment Eye Alignment: Within Functional Limits  Cognition Overall Cognitive Status: History of cognitive impairments - at baseline Arousal/Alertness: Awake/alert Orientation Level: Oriented to person;Oriented to place Attention: Selective Selective Attention: Appears intact Memory: Impaired Memory Impairment: Decreased short term memory;Decreased recall of new information Awareness: Impaired Awareness Impairment: Intellectual impairment Problem Solving: Impaired Problem Solving Impairment: Functional basic Sensation Sensation Light Touch: Appears Intact Stereognosis: Not tested Hot/Cold: Not tested Proprioception: Appears  Intact Coordination Gross Motor Movements are Fluid and Coordinated: No Fine Motor Movements are Fluid and Coordinated: No Extremity/Trunk Assessment RUE Assessment RUE Assessment: Exceptions to [facility] RUE AROM (degrees) RUE Overall AROM Comments: shoulder flexion approx 30 degrees, poor abduction RUE PROM (degrees) RUE Overall PROM Comments: WFL, tolerates full ROM RUE Strength RUE Overall Strength  Comments: 3-/5 deltoid LUE Assessment LUE Assessment: Within Functional Limits  FIM:  FIM - Grooming Grooming Steps: Wash, rinse, dry face;Wash, rinse, dry hands;Brush, comb hair Grooming: 4: Patient completes 3 of 4 or 4 of 5 steps FIM - Bathing Bathing Steps Patient Completed: Chest;Right Arm;Left Arm;Abdomen;Right upper leg;Left upper leg Bathing: 3: Mod-Patient completes 5-7 45f 10 parts or 50-74% FIM - Upper Body Dressing/Undressing Upper body dressing/undressing steps patient completed: Thread/unthread right sleeve of pullover shirt/dresss;Thread/unthread left sleeve of pullover shirt/dress;Pull shirt over trunk Upper body dressing/undressing: 3: Mod-Patient completed 50-74% of tasks FIM - Lower Body Dressing/Undressing Lower body dressing/undressing: 1: Total-Patient completed less than 25% of tasks FIM - Toileting Toileting: 0: No continent bowel/bladder events this shift FIM - Bed/Chair Transfer Bed/Chair Transfer: 3: Supine > Sit: Mod A (lifting assist/Pt. 50-74%/lift 2 legs;3: Bed > Chair or W/C: Mod A (lift or lower assist) FIM - Tub/Shower Transfers Tub/shower Transfers: 0-Activity did not occur or was simulated   Refer to Care Plan for Long Term Goals  Recommendations for other services: None  Discharge Criteria: Patient will be discharged from OT if patient refuses treatment 3 consecutive times without medical reason, if treatment goals not met, if there is a change in medical status, if patient makes no progress towards goals or if patient is discharged from  hospital.  The above assessment, treatment plan, treatment alternatives and goals were discussed and mutually agreed upon: by patient  Rosalio Loud 09/06/2013, 9:51 AM

## 2013-09-06 NOTE — Evaluation (Signed)
Speech Language Pathology Assessment and Plan  Patient Details  Name: [name] MRN: [id number] Date of Birth: 03/25/1926  SLP Diagnosis: Speech and Language deficits;Dysphagia;Cognitive Impairments  Rehab Potential: Good ELOS: 2 weeks   Today's Date: 09/06/2013 Time: 1500-1600 Time Calculation (min): 60 min  Problem List:  Patient Active Problem List   Diagnosis Date Noted  . Dysphagia, unspecified(787.20) 09/02/2013  . Dysarthria 09/02/2013  . Palliative care encounter 09/02/2013  . Weakness generalized 09/02/2013  . Atrial fibrillation 09/01/2013  . Stroke, acute, embolic 09/01/2013  . Hypertension 08/29/2013  . Dementia 03/25/2012  . Lung nodule 05/24/2011   Past Medical History:  Past Medical History  Diagnosis Date  . Dementia   . Hypertension   . Ulcer     Hx of  . Stomach cancer   . Snoring   . Fracture, intertrochanteric, left femur 03/25/2012  . Fracture, intertrochanteric, right femur 06/2008    s/p closed reduction and internal fixation  . Nodule of left lung     followed by Dr. Vassie Loll // LLL spiculated nodule noted per CT in 2012 1.5x1.6x2.4, PET in 06/03/2011 was low uptake.   Past Surgical History:  Past Surgical History  Procedure Laterality Date  . Stomach surgery      3/4 of stomach removed due to stomach cancer  . Hip surgery  2009    right  . Total hip arthroplasty  03/29/2012    Procedure: TOTAL HIP ARTHROPLASTY;  Surgeon: Eulas Post, MD;  Location: [facility] OR;  Service: Orthopedics;  Laterality: Left;  Left Hip Hemiarthroplasty    Assessment / Plan / Recommendation Clinical Impression  TRYNITY SKOUSEN is an 79 y.o. RH-female with a history of hypertension, dementia, stomach cancer and peptic ulcer disease, presenting with new onset of inability to speak. Patient reportedly was inattentive to those around her and attempted to speak but could not utter any sounds and was aware that she could not speak. CT scan of her head showed no acute  intracranial abnormality. Her ability to speak improved markedly while being evaluated in the emergency room. MRI/MRA brain done revealing small/patchy left MCA infarct and no stenosis. 2D echo with EF 65-70% and grade 1 diastolic dysfunction. Neurology recommends ASA and Plavix for thrombotic infarct due to SVD. Swallow evaluation done and NPO recommended. Panda placed for nutritional support. She was noted to be in A Fib but developed bradycardia on IV Cardizem. Dr. Johney Frame following for input and recommended amiodarone, avoiding PPM due to advanced age and coumadin for secondary stroke prevention. MTSB recommending discussion with family regarding pro/cons of anticoagulation. Blood pressures labile and HCTZ added today for better control. Therapies ongoing and team recommending CIR for progression. Speech intelligibility improving and working on dysphagia exercises--recommending hold off on PEG. Patient admitted to [facility] 09/05/13.  Orders received; Bedside Swallow and Speech Evaluations completed. Per son's report patient at baseline level of functioning with cognition.  Patient demonstrates generalized oral motor weakness and pharyngeal weakness per objective assessment previously completed, which impacts her ability to express basic wants and needs as well as safely consume p.o. at this time.  As a result, this patient would benefit from skilled SLP services to maximize functional abilities and reduce burden of care prior to discharge home with son.     SLP initiated education regarding pharyngeal strengthening exercises.  Son demonstrated appropriate cues during patient's performance of previously taught exercises (from acute care).  SLP educated them on adding hard effotful swallows to the list  with focus on patient's ability to initiate and perform consecutive swallows prior to repeat objective swallow assessment.      SLP Assessment  Patient will need skilled Speech Lanaguage  Pathology Services during CIR admission    Recommendations  Diet Recommendations: NPO;Alternative means - temporary Medication Administration: Via alternative means Oral Care Recommendations: Oral care QID Patient destination: Home Follow up Recommendations: Home Health SLP;24 hour supervision/assistance Equipment Recommended: None recommended by SLP    SLP Frequency 5 out of 7 days   SLP Treatment/Interventions Cueing hierarchy;Dysphagia/aspiration precaution training;Environmental controls;Functional tasks;Internal/external aids;Oral motor exercises;Patient/family education;Speech/Language facilitation;Other (comment) (pharyngeal strengthening exercises)    Pain Pain Assessment Pain Assessment: No/denies pain Prior Functioning Cognitive/Linguistic Baseline: Baseline deficits Baseline deficit details: mild dementia; required 24/7 supervision PTA  Lives With: Son Available Help at Discharge: Family;Available 24 hours/day Vocation: Retired  Teacher, music Term Goals: Week 1: SLP Short Term Goal 1 (Week 1): Patient will consume trials of puree with Mod clinician cues to perfrom 2-3 hard effortful swallows per bolus.  SLP Short Term Goal 2 (Week 1): Patient will perform oral motor and pharyngeal strengtheing exercises with Mod verbal cues. SLP Short Term Goal 3 (Week 1): Patient will increase speech intelligibilty at the phrase level with Mod verbal cues to increase vocal intensity and utilize over articulation.   See FIM for current functional status Refer to Care Plan for Long Term Goals  Recommendations for other services: None  Discharge Criteria: Patient will be discharged from SLP if patient refuses treatment 3 consecutive times without medical reason, if treatment goals not met, if there is a change in medical status, if patient makes no progress towards goals or if patient is discharged from hospital.  The above assessment, treatment plan, treatment alternatives and goals were  discussed and mutually agreed upon: by patient and by family  Charlane Ferretti., CCC-SLP [phone]  Tarah Buboltz 09/06/2013, 5:29 PM

## 2013-09-06 NOTE — Progress Notes (Signed)
Social Work Assessment and Plan Social Work Assessment and Plan  Patient Details  Name: [name] MRN: [id number] Date of Birth: 1926-06-20  Today's Date: 09/06/2013  Problem List:  Patient Active Problem List   Diagnosis Date Noted  . Dysphagia, unspecified(787.20) 09/02/2013  . Dysarthria 09/02/2013  . Palliative care encounter 09/02/2013  . Weakness generalized 09/02/2013  . Atrial fibrillation 09/01/2013  . Stroke, acute, embolic 09/01/2013  . Hypertension 08/29/2013  . Dementia 03/25/2012  . Lung nodule 05/24/2011   Past Medical History:  Past Medical History  Diagnosis Date  . Dementia   . Hypertension   . Ulcer     Hx of  . Stomach cancer   . Snoring   . Fracture, intertrochanteric, left femur 03/25/2012  . Fracture, intertrochanteric, right femur 06/2008    s/p closed reduction and internal fixation  . Nodule of left lung     followed by Dr. Vassie Loll // LLL spiculated nodule noted per CT in 2012 1.5x1.6x2.4, PET in 06/03/2011 was low uptake.   Past Surgical History:  Past Surgical History  Procedure Laterality Date  . Stomach surgery      3/4 of stomach removed due to stomach cancer  . Hip surgery  2009    right  . Total hip arthroplasty  03/29/2012    Procedure: TOTAL HIP ARTHROPLASTY;  Surgeon: Eulas Post, MD;  Location: [facility] OR;  Service: Orthopedics;  Laterality: Left;  Left Hip Hemiarthroplasty   Social History:  reports that she quit smoking about 39 years ago. Her smoking use included Cigarettes. She has a .6 pack-year smoking history. She has never used smokeless tobacco. She reports that  drinks alcohol. Her drug history is not on file.  Family / Support Systems Marital Status: Widow/Widower Patient Roles: Parent Children: Brad-son  [phone]  [phone] Other Supports: Daughter local also and involved Anticipated Caregiver: Brad Ability/Limitations of Caregiver: None Caregiver Availability: 24/7 Family Dynamics: Close knit family who are  involved and supportive.  Son stays here and is very attenitve of Mother.  He has been taking care of her for 6 years.  Social History Preferred language: English Religion: Presbyterian Cultural Background: No issues Education: Some college Read: Yes Write: Yes Employment Status: Retired Fish farm manager Issues: No issues- according to MD pt is intermittently capable of mkaing her own decisions.  Son is here and will assist in the decisions, will also include daughter, since no formal POA    Abuse/Neglect Physical Abuse: Denies Verbal Abuse: Denies Sexual Abuse: Denies Exploitation of patient/patient's resources: Denies Self-Neglect: Denies  Emotional Status Pt's affect, behavior adn adjustment status: Pt is motivated and hopeful her swallow will improve.  She does not want this NG tube for long.  Son is very supportive and observes in therapies also stays here with pt. Recent Psychosocial Issues: Other medical issues Pyschiatric History: No history-deferred depression screen due to pt tired and wanting to lay down.  Son feels Mom is doing well with her coping with all of this.  She is bright and cheerful and quick in some of her responses. Substance Abuse History: No issues  Patient / Family Perceptions, Expectations & Goals Pt/Family understanding of illness & functional limitations: Pt and son have a good understanding of her stroke and deficits.  Her main issue is her swallow and is hopeful she will improve while here.  Son will priovide the car ehwne she goes home so is observing in therapies. 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."  [employer]: None Premorbid Home Care/DME  Agencies: None Transportation available at discharge: Son Resource referrals recommended: Support group (specify) ([facility] Support Group)  Discharge Planning Living Arrangements: Children Support Systems: Children;Friends/neighbors;Church/faith community Type of Residence: Private residence Insurance Resources: Administrator (specify) Herbalist) Financial Resources: Restaurant manager, fast food Screen Referred: No Living Expenses: Own Money Management: Family Does the patient have any problems obtaining your medications?: No Home Management: Son does the home management Patient/Family Preliminary Plans: Return home with son providing care.  He has been providing care to her for the last few years.  He is here and participates in Mom's care. Social Work Anticipated Follow Up Needs: HH/OP;Support Group  Clinical Impression Pleasant female who is quite funny with her responses and supportive and involved son who is very attentive.  Main issue is pt's swallow and speech working on this. Will assist with discharge planning and work with both while here.  Lucy Chris 09/06/2013, 4:05 PM

## 2013-09-06 NOTE — Evaluation (Signed)
Physical Therapy Assessment and Plan  Patient Details  Name: [name] MRN: [id number] Date of Birth: 07-Nov-1926  PT Diagnosis: Abnormal posture, Abnormality of gait, Cognitive deficits, Difficulty walking, Hemiparesis dominant, Impaired cognition and Muscle weakness (generalized) Rehab Potential: Good ELOS: 2 weeks   Today's Date: 09/06/2013 Time: 4098-1191 Time Calculation (min): 54 min  Problem List:  Patient Active Problem List   Diagnosis Date Noted  . Dysphagia, unspecified(787.20) 09/02/2013  . Dysarthria 09/02/2013  . Palliative care encounter 09/02/2013  . Weakness generalized 09/02/2013  . Atrial fibrillation 09/01/2013  . Stroke, acute, embolic 09/01/2013  . Hypertension 08/29/2013  . Dementia 03/25/2012  . Lung nodule 05/24/2011    Past Medical History:  Past Medical History  Diagnosis Date  . Dementia   . Hypertension   . Ulcer     Hx of  . Stomach cancer   . Snoring   . Fracture, intertrochanteric, left femur 03/25/2012  . Fracture, intertrochanteric, right femur 06/2008    s/p closed reduction and internal fixation  . Nodule of left lung     followed by Dr. Vassie Loll // LLL spiculated nodule noted per CT in 2012 1.5x1.6x2.4, PET in 06/03/2011 was low uptake.   Past Surgical History:  Past Surgical History  Procedure Laterality Date  . Stomach surgery      3/4 of stomach removed due to stomach cancer  . Hip surgery  2009    right  . Total hip arthroplasty  03/29/2012    Procedure: TOTAL HIP ARTHROPLASTY;  Surgeon: Eulas Post, MD;  Location: [facility] OR;  Service: Orthopedics;  Laterality: Left;  Left Hip Hemiarthroplasty    Assessment & Plan Clinical Impression: Patient is a 79 y.o. year old female with a history of hypertension, dementia, stomach cancer and peptic ulcer disease, presenting with new onset of inability to speak. Patient reportedly was inattentive to those around her and attempted to speak but could not utter any sounds and was aware that  she could not speak. CT scan of her head showed no acute intracranial abnormality. Her ability to speak improved markedly while being evaluated in the emergency room. MRI/MRA brain done revealing small/patchy left MCA infarct and no stenosis. 2D echo with EF 65-70% and grade 1 diastolic dysfunction. Neurology recommends ASA and Plavix for thrombotic infarct due to SVD. Swallow evaluation done and NPO recommended. Panda placed yesterday for nutritional support.  She was noted to be in A Fib but developed bradycardia on IV Cardizem. Dr. Johney Frame following for input and recommended amiodarone, avoiding PPM due to advanced age and coumadin for secondary stroke prevention. MTSB recommending discussion with family regarding pro/cons of anticoagulation. Blood pressures labile and HCTZ added today for better control. Therapies ongoing and team recommending CIR for progression. Speech intelligibility improving and working on dysphagia exercises--recommending hold off on PEG.  Patient transferred to CIR on 09/05/2013 .   Patient currently requires mod with mobility secondary to muscle weakness, decreased cardiorespiratoy endurance and decreased problem solving, decreased safety awareness and decreased memory.  Prior to hospitalization, patient was supervision to min assist with mobility and lived with Son in a House home.  Home access is  Ramped entrance.  Patient will benefit from skilled PT intervention to maximize safe functional mobility, minimize fall risk and decrease caregiver burden for planned discharge home with 24 hour assist.  Anticipate patient will benefit from follow up [facility] at discharge.  PT - End of Session Endurance Deficit: Yes Endurance Deficit Description: Pt with mild c/o fatigue  during session, however allowed rest breaks in between tasks.  PT Assessment Rehab Potential: Good PT Patient demonstrates impairments in the following area(s): Balance;Motor;Endurance;Safety PT Transfers Functional  Problem(s): Bed Mobility;Bed to Chair;Car;Furniture PT Locomotion Functional Problem(s): Ambulation;Wheelchair Mobility;Stairs (stairs for community use) PT Plan PT Intensity: Minimum of 1-2 x/day ,45 to 90 minutes PT Frequency: 5 out of 7 days PT Duration Estimated Length of Stay: 2 weeks PT Treatment/Interventions: Ambulation/gait training;Balance/vestibular training;Cognitive remediation/compensation;Discharge planning;DME/adaptive equipment instruction;Functional mobility training;Neuromuscular re-education;Patient/family education;Psychosocial support;Stair training;Therapeutic Activities;Therapeutic Exercise;UE/LE Strength taining/ROM;UE/LE Coordination activities;Wheelchair propulsion/positioning PT Transfers Anticipated Outcome(s): supervision PT Locomotion Anticipated Outcome(s): supervision to min assist (son was assisting with amb PTA) PT Recommendation Follow Up Recommendations: Home health PT;24 hour supervision/assistance Patient destination: Home Equipment Details: TBD pending pts progress  Skilled Therapeutic Intervention Pt received sitting in w/c in room with son present during evaluation.  Assisted into hallway in w/c to assess w/c mobility.  She was able to use BUEs to self propel with min assist and max cues for initiation of task, as pt was unable to problem solve correct way of propelling chair.  Attempted to assess ambulation without AD, however pt only able to take a few steps forward at mod assist with max cues for technique.  Note that when standing, pt with increased knee flex and increased reliance on back of chair to maintain upright stance.  Provided cues for upright posture at chest with manual facilitation to increased quad activation.  Transitioned to using RW for 44' of gait training (see details below).  Pt with very slow gait pattern, indicating high risk for falls.  Performed stairs with B handrails at mod assist with cues for safe step to technique, esp on more  average side step (6").  Performed bed mobility with top handrail down to better simulate home environment, however she did use R handrail when sitting up, but was able perform all bed mobility at supervision level.  Performs bed <> w/c transfer at mod assist (more min assist to stand, however mod assist to sit) with manual facilitation for forward weight shift and quad activation to achieve full standing in order to pivot.  Pt does well reaching for arm rests during transfer for safety.   PT Evaluation Precautions/Restrictions Precautions Precautions: Fall Precaution Comments: Panda in R nare, memory issues (hx of dementia), pt with both urinary and bowel incontinence. Restrictions Weight Bearing Restrictions: No General Chart Reviewed: Yes Family/Caregiver Present: Yes Vital Signs  Pain Pain Assessment Pain Assessment: No/denies pain Pain Score: 0-No pain Home Living/Prior Functioning Home Living Available Help at Discharge: Family;Available 24 hours/day Type of Home: House Home Access: Ramped entrance Home Layout: Two level;Able to live on main level with bedroom/bathroom Additional Comments: pt's son reports she would walk around the home with hand held assist or SBA with out AD, but would use RW when outside or on driveway for longer distances/uneven surfaces  Lives With: Son Prior Function Level of Independence: Needs assistance with gait;Needs assistance with homemaking;Needs assistance with ADLs Driving: No Vocation: Retired Comments: Son reports they were suppose to drive to [employer] and [employer] for a trip next week.  She likes to go out to eat with son.  Vision/Perception  Vision - History Baseline Vision: No visual deficits Patient Visual Report: No change from baseline Vision - Assessment Eye Alignment: Within Functional Limits  Cognition Overall Cognitive Status: History of cognitive impairments - at baseline Arousal/Alertness: Awake/alert Orientation Level: Oriented  to person;Oriented to place;Oriented to situation Attention: Selective Focused Attention:  Appears intact Sustained Attention: Appears intact Selective Attention: Appears intact Memory: Impaired Memory Impairment: Decreased short term memory;Decreased recall of new information Decreased Short Term Memory: Functional basic Awareness: Impaired Awareness Impairment: Intellectual impairment Problem Solving: Impaired Problem Solving Impairment: Functional basic Safety/Judgment: Appears intact Comments: Note she was trying to get out of chair at end of session.  Quick release belt applied for safety.  Sensation Sensation Light Touch: Appears Intact Stereognosis: Not tested Hot/Cold: Not tested Proprioception: Appears Intact Coordination Gross Motor Movements are Fluid and Coordinated: No Fine Motor Movements are Fluid and Coordinated: No Coordination and Movement Description: pt with somewhat decreased initation of task with decreased fluidity of movement.  Motor  Motor Motor: Abnormal postural alignment and control;Hemiplegia Motor - Skilled Clinical Observations: Very mild weakness in RLE vs LLE (decreased hip ROM in LLE due to past THA) and also with forward flexed posture in sitting/standing.   Mobility Bed Mobility Bed Mobility: Supine to Sit;Sit to Supine Supine to Sit: 5: Supervision Supine to Sit Details: Verbal cues for technique;Verbal cues for precautions/safety Supine to Sit Details (indicate cue type and reason): Cues to decrease use of handrails to better simulate home environment.  Sit to Supine: 5: Supervision Sit to Supine - Details: Verbal cues for technique;Verbal cues for precautions/safety Transfers Transfers: Yes Sit to Stand: 4: Min assist Sit to Stand Details: Verbal cues for technique;Verbal cues for precautions/safety;Manual facilitation for weight shifting;Verbal cues for sequencing Sit to Stand Details (indicate cue type and reason): Requires manual  facilitation for forward weight shift for improved standing technique.  she does very well pushing up from chair to stand. Requires cues for activation of glutes/quads when attaining standing.  Stand to Sit: 3: Mod assist Stand to Sit Details (indicate cue type and reason): Manual facilitation for weight shifting;Verbal cues for technique;Verbal cues for precautions/safety;Verbal cues for sequencing Stand to Sit Details: Requires manual facilitation and assist to increase forward weight shift and control descent when sitting.  also cues for hand placement on chair prior to sitting.  Locomotion  Ambulation Ambulation: Yes Ambulation/Gait Assistance: 4: Min assist Ambulation Distance (Feet): 87 Feet (3' w/o RW at mod assist) Assistive device: Rolling walker Ambulation/Gait Assistance Details: Verbal cues for sequencing;Verbal cues for technique;Verbal cues for precautions/safety;Manual facilitation for weight shifting;Verbal cues for gait pattern;Verbal cues for safe use of DME/AE Ambulation/Gait Assistance Details: Requires min assist  to steady and increase weight shift to R LE and also to ensure RW remain close to her during ambulation.  Provided verbal cuing for increased stride length, upright posture throughout and also maintaining position inside of RW.  Gait Gait: Yes Gait Pattern: Impaired Gait Pattern: Step-through pattern;Decreased stride length;Decreased weight shift to right;Right flexed knee in stance;Left flexed knee in stance;Trunk flexed;Narrow base of support Wheelchair Mobility Distance: 15'  Trunk/Postural Assessment  Postural Control Postural Control: Deficits on evaluation Postural Limitations: pt demonstrates forward head, rounded shoulders and increased posterior pelvic tilt during sitting and flexed posture in standing.   Balance Static Sitting Balance Static Sitting - Balance Support: Feet supported;No upper extremity supported Static Sitting - Level of Assistance: 5:  Stand by assistance Static Standing Balance Static Standing - Balance Support: Right upper extremity supported Static Standing - Level of Assistance: 3: Mod assist Static Standing - Comment/# of Minutes: min to mod assist to maintain standing balance without use of RW.  Extremity Assessment  RUE Assessment RUE Assessment: Exceptions to [facility] RUE AROM (degrees) RUE Overall AROM Comments: shoulder flexion approx 30 degrees, poor  abduction RUE PROM (degrees) RUE Overall PROM Comments: WFL, tolerates full ROM RUE Strength RUE Overall Strength Comments: 3-/5 deltoid LUE Assessment LUE Assessment: Within Functional Limits RLE Assessment RLE Assessment: Exceptions to [facility] RLE Strength RLE Overall Strength: Deficits RLE Overall Strength Comments: pt with 3+/5 hip flex, 3+/5 knee ext, 4/5 knee flex, 5/5 ankle DF/PF LLE Assessment LLE Assessment: Exceptions to [facility] LLE Strength LLE Overall Strength: Deficits LLE Overall Strength Comments: hip flex 3-/5 (able to hold some resistance, however not full ROM due to hip replacement 1 year ago), knee flex/ext 4/5, ankle DF/PF 4/5  FIM:  FIM - Bed/Chair Transfer Bed/Chair Transfer Assistive Devices: Arm rests Bed/Chair Transfer: 5: Sit > Supine: Supervision (verbal cues/safety issues);5: Supine > Sit: Supervision (verbal cues/safety issues);3: Bed > Chair or W/C: Mod A (lift or lower assist);3: Chair or W/C > Bed: Mod A (lift or lower assist) FIM - Locomotion: Wheelchair Distance: 15' Locomotion: Wheelchair: 1: Travels less than 50 ft with minimal assistance (Pt.>75%) FIM - Locomotion: Ambulation Locomotion: Ambulation Assistive Devices: Designer, industrial/product Ambulation/Gait Assistance: 4: Min assist Locomotion: Ambulation: 2: Travels 50 - 149 ft with minimal assistance (Pt.>75%) FIM - Locomotion: Stairs Locomotion: Building control surveyor: Hand rail - 2 Locomotion: Stairs: 2: Up and Down 4 - 11 stairs with moderate assistance (Pt: 50 - 74%)   Refer  to Care Plan for Long Term Goals  Recommendations for other services: None  Discharge Criteria: Patient will be discharged from PT if patient refuses treatment 3 consecutive times without medical reason, if treatment goals not met, if there is a change in medical status, if patient makes no progress towards goals or if patient is discharged from hospital.  The above assessment, treatment plan, treatment alternatives and goals were discussed and mutually agreed upon: by patient and by family  Vista Deck 09/06/2013, 12:04 PM

## 2013-09-06 NOTE — Progress Notes (Signed)
Physical Therapy Session Note  Patient Details  Name: [name] MRN: [id number] Date of Birth: 1925/12/17  Today's Date: 09/06/2013 Time: 1330-1400 Time Calculation (min): 30 min  Short Term Goals: Week 1:  PT Short Term Goal 1 (Week 1): STG's=LTG's due to short ELOS  Skilled Therapeutic Interventions/Progress Updates:  Pt received sitting in w/c, no c/o. Pt performed w/c mobility 60' and 52' with min assist to improve propulsion in straight path, requires increased time. Gait with RW with min A 125' and vcs to improve step length, increased vcs with navigating turns and obstacles. Standing balance activities with use of B UEs for activity to maintain unsupported standing, min A to maintain posture and balance.  Therapy Documentation Precautions:  Precautions Precautions: Fall Precaution Comments: Panda in R nare, memory issues (hx of dementia), pt with both urinary and bowel incontinence. Restrictions Weight Bearing Restrictions: No       Pain:No pain/ denies pain      Locomotion : Ambulation Ambulation: Yes Ambulation/Gait Assistance: 4: Min assist Ambulation Distance (Feet): 125 Feet  Assistive device: Rolling walker Ambulation/Gait Assistance Details: Verbal cues for sequencing;Verbal cues for technique;Verbal cues for precautions/safety;Manual facilitation for weight shifting;Verbal cues for gait pattern;Verbal cues for safe use of DME/AE Ambulation/Gait Assistance Details: Requires min assist  to steady and increase weight shift to R LE and also to ensure RW remain close to her during ambulation.  Provided verbal cuing for increased stride length, upright posture throughout and also maintaining position inside of RW.  Gait Gait: Yes Gait Pattern: Impaired Gait Pattern: Step-through pattern;Decreased stride length;Decreased weight shift to right;Right flexed knee in stance;Left flexed knee in stance;Trunk flexed;Narrow base of support Wheelchair Mobility Distance:  36'            See FIM for current functional status  Therapy/Group: Individual Therapy  Jackelyn Knife 09/06/2013, 3:34 PM

## 2013-09-06 NOTE — Progress Notes (Addendum)
INITIAL NUTRITION ASSESSMENT  DOCUMENTATION CODES Per approved criteria  -Underweight   INTERVENTION: ChangeTF regimen to Jevity 1.2 @ 60 ml/hr for 20 hours daily. At goal rate, tube feeding regimen will provide 1440 kcal, 67 grams of protein, and 968 ml of H2O.  Provide 100 ml free water flushes every 6 hours.  RD to monitor SLP evaluations and pt's PO progress  NUTRITION DIAGNOSIS: Inadequate oral intake related to inability to eat as evidenced by NPO status.   Goal: Pt to meet >/= 90% of their estimated nutrition needs   Monitor:  TF rate/tolerance SLP Eval/pt PO intake Weight trend Labs  Reason for Assessment: Tube Feed  79 y.o. female  Admitting Dx: <principal problem not specified>  ASSESSMENT: 79 y.o. RH-female with a history of hypertension, dementia, stomach cancer and peptic ulcer disease, presenting with new onset of inability to speak. MRI/MRA brain done revealing small/patchy left MCA infarct and no stenosis. Swallow evaluation done and NPO recommended. Panda tube was placed for nutrition support.   Pt has NGT on place with Jevity 1.2 @ 55 ml/hr which provides 1584 kcal, 73 grams protein, and 1065 ml free water. Last BM 9/26 and 0 ml residuals per nursing notes. Pt denies nausea, bloating, abdominal pain, constipation, and diarrhea. Pt's weight has dropped 6 lbs below usual body weight.   Height: Ht Readings from Last 1 Encounters:  08/30/13 5\' 5"  (1.651 m)    Weight: Wt Readings from Last 1 Encounters:  09/06/13 91 lb 4.3 oz (41.4 kg)    Ideal Body Weight: 125 lbs  % Ideal Body Weight: 73%  Wt Readings from Last 10 Encounters:  09/06/13 91 lb 4.3 oz (41.4 kg)  09/05/13 97 lb (44 kg)  06/27/13 97 lb (43.999 kg)  06/04/13 97 lb 4.8 oz (44.135 kg)  05/18/13 94 lb (42.638 kg)  03/25/13 97 lb (43.999 kg)  08/22/12 100 lb (45.36 kg)  04/01/12 113 lb (51.256 kg)  04/01/12 113 lb (51.256 kg)  04/01/12 113 lb (51.256 kg)    Usual Body Weight: 97  lbs  % Usual Body Weight: 94%  BMI:  Body mass index is 15.19 kg/(m^2).  Estimated Nutritional Needs: Kcal: 1330-1520 Protein: 58-66 grams Fluid: 1.3-1.5 L/day  Skin: unstageable pressure ulcer on mid coccyx  Diet Order: NPO  EDUCATION NEEDS: -No education needs identified at this time   Intake/Output Summary (Last 24 hours) at 09/06/13 1603 Last data filed at 09/05/13 1735  Gross per 24 hour  Intake      0 ml  Output      0 ml  Net      0 ml    Last BM: 9/26   Labs:   Recent Labs Lab 09/02/13 0924 09/04/13 1130 09/06/13 0500  NA 146* 142 140  K 3.5 3.2* 4.0  CL 114* 110 107  CO2 19 20 24   BUN 29* 18 24*  CREATININE 1.79* 1.27* 1.36*  CALCIUM 9.3 8.3* 7.8*  GLUCOSE 117* 183* 144*    CBG (last 3)   Recent Labs  09/05/13 2101 09/06/13 0006 09/06/13 0704  GLUCAP 133* 121* 141*    Scheduled Meds: . amiodarone  200 mg Oral BID  . antiseptic oral rinse  15 mL Mouth Rinse q12n4p  . aspirin EC  325 mg Oral Daily  . atorvastatin  40 mg Oral q1800  . calcitRIOL  0.25 mcg Per Tube Daily  . chlorhexidine  15 mL Mouth Rinse BID  . coumadin book   Does not apply  Once  . enoxaparin (LOVENOX) injection  30 mg Subcutaneous Q24H  . furosemide  20 mg Per Tube Daily  . insulin aspart  0-9 Units Subcutaneous TID WC  . pantoprazole sodium  40 mg Per Tube Daily  . sodium bicarbonate  650 mg Per Tube QID  . warfarin  2.5 mg Oral ONCE-1800  . warfarin   Does not apply Once  . Warfarin - Pharmacist Dosing Inpatient   Does not apply q1800    Continuous Infusions: . feeding supplement (JEVITY 1.2 CAL)      Past Medical History  Diagnosis Date  . Dementia   . Hypertension   . Ulcer     Hx of  . Stomach cancer   . Snoring   . Fracture, intertrochanteric, left femur 03/25/2012  . Fracture, intertrochanteric, right femur 06/2008    s/p closed reduction and internal fixation  . Nodule of left lung     followed by Dr. Vassie Loll // LLL spiculated nodule noted per CT  in 2012 1.5x1.6x2.4, PET in 06/03/2011 was low uptake.    Past Surgical History  Procedure Laterality Date  . Stomach surgery      3/4 of stomach removed due to stomach cancer  . Hip surgery  2009    right  . Total hip arthroplasty  03/29/2012    Procedure: TOTAL HIP ARTHROPLASTY;  Surgeon: Eulas Post, MD;  Location: [facility] OR;  Service: Orthopedics;  Laterality: Left;  Left Hip Hemiarthroplasty    Ian Malkin RD, LDN Inpatient Clinical Dietitian Pager: [phone] After Hours Pager: [phone]

## 2013-09-06 NOTE — Care Management Note (Signed)
[facility] Individual Statement of Services  Patient Name:  [name]  Date:  09/06/2013  Welcome to the [facility].  Our goal is to provide you with an individualized program based on your diagnosis and situation, designed to meet your specific needs.  With this comprehensive rehabilitation program, you will be expected to participate in at least 3 hours of rehabilitation therapies Monday-Friday, with modified therapy programming on the weekends.  Your rehabilitation program will include the following services:  Physical Therapy (PT), Occupational Therapy (OT), Speech Therapy (ST), 24 hour per day rehabilitation nursing, Case Management (Social Worker), Rehabilitation Medicine, Nutrition Services and Pharmacy Services  Weekly team conferences will be held on Wednesday to discuss your progress.  Your Social Worker will talk with you frequently to get your input and to update you on team discussions.  Team conferences with you and your family in attendance may also be held.  Expected length of stay: 2 weeks Overall anticipated outcome: supervision with cues  Depending on your progress and recovery, your program may change. Your Social Worker will coordinate services and will keep you informed of any changes. Your Social Worker's name and contact numbers are listed  below.  The following services may also be recommended but are not provided by the [facility]:   Home Health Rehabiltiation Services  Outpatient Rehabilitation Services   Arrangements will be made to provide these services after discharge if needed.  Arrangements include referral to agencies that provide these services.  Your insurance has been verified to be:  Medicare & BCBS Your primary doctor is:  Dr Elvina Sidle  Pertinent information will be shared with your doctor and your insurance company.  Social Worker:  Dossie Der, SW [phone] or ([phone]  Information discussed with and copy given to patient by: Lucy Chris, 09/06/2013, 2:49 PM

## 2013-09-07 ENCOUNTER — Encounter (HOSPITAL_COMMUNITY): Payer: Medicare Other | Admitting: Occupational Therapy

## 2013-09-07 ENCOUNTER — Inpatient Hospital Stay (HOSPITAL_COMMUNITY): Payer: Medicare Other | Admitting: Physical Therapy

## 2013-09-07 ENCOUNTER — Inpatient Hospital Stay (HOSPITAL_COMMUNITY): Payer: Medicare Other | Admitting: Speech Pathology

## 2013-09-07 DIAGNOSIS — R1310 Dysphagia, unspecified: Secondary | ICD-10-CM

## 2013-09-07 DIAGNOSIS — I6340 Cerebral infarction due to embolism of unspecified cerebral artery: Secondary | ICD-10-CM

## 2013-09-07 DIAGNOSIS — I4891 Unspecified atrial fibrillation: Secondary | ICD-10-CM

## 2013-09-07 DIAGNOSIS — I10 Essential (primary) hypertension: Secondary | ICD-10-CM

## 2013-09-07 LAB — PROTIME-INR
INR: 1.02 (ref 0.00–1.49)
Prothrombin Time: 13.2 seconds (ref 11.6–15.2)

## 2013-09-07 LAB — GLUCOSE, CAPILLARY
Glucose-Capillary: 134 mg/dL — ABNORMAL HIGH (ref 70–99)
Glucose-Capillary: 138 mg/dL — ABNORMAL HIGH (ref 70–99)
Glucose-Capillary: 114 mg/dL — ABNORMAL HIGH (ref 70–99)

## 2013-09-07 MED ORDER — WARFARIN SODIUM 2.5 MG PO TABS
2.5000 mg | ORAL_TABLET | Freq: Once | ORAL | Status: AC
  Administered 2013-09-07: 2.5 mg via ORAL
  Filled 2013-09-07 (×2): qty 1

## 2013-09-07 NOTE — Progress Notes (Signed)
Occupational Therapy Session Note  Patient Details  Name: [name] MRN: [id number] Date of Birth: December 27, 1925  Today's Date: 09/07/2013 Time: 5621-3086 Time Calculation (min): 57 min  Short Term Goals: Week 1:  OT Short Term Goal 1 (Week 1): Pt will complete stand pivot transfer with min assist OT Short Term Goal 2 (Week 1): Pt will complete UB dressing with supervision OT Short Term Goal 3 (Week 1): Pt will complete bathing with min assist at sit <> stand level OT Short Term Goal 4 (Week 1): Pt will complete LB dressing with mod assist  Skilled Therapeutic Interventions/Progress Updates:    Patient seen this am for 1:1 OT session to address decreasing assistance with basic self care skills.  Patient declined need for bathroom, yet had continent void (bowel and bladder) when transferred to toilet.  Patient transitioned from sit to stand on multiple occasions with close supervision, yet stood with flexed posture.  With verbal cues, patient able to stand erect.  Patient with reduced stand tolerance, and reliant on upper extremities for balance in standing.  Patient repeatedly requesting drink of water, and upon doctor's arrival, asking to go home.  Daughter present for initial part of this OT session.   Therapy Documentation Precautions:  Precautions Precautions: Fall Precaution Comments: Panda in R nare, memory issues (hx of dementia), pt with both urinary and bowel incontinence. Restrictions Weight Bearing Restrictions: No    Pain:  Noted occasional pain in right foot, significant deformity.  Patient cautious when sock taken off/ put on right foot  See FIM for current functional status  Therapy/Group: Individual Therapy  Collier Salina 09/07/2013, 8:59 AM

## 2013-09-07 NOTE — Progress Notes (Signed)
ANTICOAGULATION CONSULT NOTE - Follow Up Consult  Pharmacy Consult for Warfarin Indication: New onset Afib  No Known Allergies  Patient Measurements: Weight: 89 lb 15.2 oz (40.8 kg)  Vital Signs: Temp: 97.4 F (36.3 C) (09/27 0549) Temp src: Oral (09/27 0549) BP: 146/52 mmHg (09/27 0549) Pulse Rate: 53 (09/27 0549)  Labs:  Recent Labs  09/04/13 1130 09/06/13 0500 09/07/13 0600  HGB 10.2* 9.4*  --   HCT 31.8* 29.1*  --   PLT 194 183  --   LABPROT  --  12.5 13.2  INR  --  0.95 1.02  CREATININE 1.27* 1.36*  --     The CrCl is unknown because both a height and weight (above a minimum accepted value) are required for this calculation.   Assessment: 79 y.o. f who continues on warfarin for new-onset Afib with a SUBtherapeutic INR this morning (INR 1.02 << 0.95). No CBC today -- no overt s/sx of bleeding noted. It is also noted that the patient is on Lovenox 30 mg SQ every 24 hours until INR >/= 2. Given that the patient's advanced age and concurrent use of amiodarone -- will give low dose of 2.5 mg again today. Will consider increasing on 9/28 based on INR trends.  Goal of Therapy:  INR 2-3   Plan:  1. Warfarin 2.5 mg x 1 dose at 1800 today 2. Will plan to d/c lovenox when the INR >/= 2 3. Consider changing ASA from 325 mg to 81 mg while concurrently on lovenox and warfarin 4. Will continue to monitor for any signs/symptoms of bleeding and will follow up with PT/INR in the a.m.   Georgina Pillion, PharmD, BCPS Clinical Pharmacist Pager: [phone] 09/07/2013 11:04 AM

## 2013-09-07 NOTE — Progress Notes (Signed)
Patient ID: [name], female   DOB: [birth year], 79 y.o.   MRN: [id number] Subjective/Complaints:  56/36. 79 year old patient with a history of hypertension dementia and atrial fibrillation who is admitted with an acute embolic stroke. Patient remains on enteral feedings due to significant dysphagia  No complaints. Daughter at bedside    Objective: Vital Signs: Blood pressure 146/52, pulse 53, temperature 97.4 F (36.3 C), temperature source Oral, resp. rate 16, weight 40.8 kg (89 lb 15.2 oz), SpO2 92.00%. No results found. Results for orders placed during the hospital encounter of 09/05/13 (from the past 72 hour(s))  GLUCOSE, CAPILLARY     Status: Abnormal   Collection Time    09/05/13  9:01 PM      Result Value Range   Glucose-Capillary 133 (*) 70 - 99 mg/dL   Comment 1 Notify RN    GLUCOSE, CAPILLARY     Status: Abnormal   Collection Time    09/06/13 12:06 AM      Result Value Range   Glucose-Capillary 121 (*) 70 - 99 mg/dL   Comment 1 Notify RN    CBC WITH DIFFERENTIAL     Status: Abnormal   Collection Time    09/06/13  5:00 AM      Result Value Range   WBC 6.8  4.0 - 10.5 K/uL   RBC 3.02 (*) 3.87 - 5.11 MIL/uL   Hemoglobin 9.4 (*) 12.0 - 15.0 g/dL   HCT 40.9 (*) 81.1 - 91.4 %   MCV 96.4  78.0 - 100.0 fL   MCH 31.1  26.0 - 34.0 pg   MCHC 32.3  30.0 - 36.0 g/dL   RDW 78.2  95.6 - 21.3 %   Platelets 183  150 - 400 K/uL   Neutrophils Relative % 81 (*) 43 - 77 %   Neutro Abs 5.5  1.7 - 7.7 K/uL   Lymphocytes Relative 9 (*) 12 - 46 %   Lymphs Abs 0.6 (*) 0.7 - 4.0 K/uL   Monocytes Relative 7  3 - 12 %   Monocytes Absolute 0.5  0.1 - 1.0 K/uL   Eosinophils Relative 2  0 - 5 %   Eosinophils Absolute 0.2  0.0 - 0.7 K/uL   Basophils Relative 0  0 - 1 %   Basophils Absolute 0.0  0.0 - 0.1 K/uL  COMPREHENSIVE METABOLIC PANEL     Status: Abnormal   Collection Time    09/06/13  5:00 AM      Result Value Range   Sodium 140  135 - 145 mEq/L   Potassium 4.0  3.5 - 5.1 mEq/L    Chloride 107  96 - 112 mEq/L   CO2 24  19 - 32 mEq/L   Glucose, Bld 144 (*) 70 - 99 mg/dL   BUN 24 (*) 6 - 23 mg/dL   Creatinine, Ser 0.86 (*) 0.50 - 1.10 mg/dL   Calcium 7.8 (*) 8.4 - 10.5 mg/dL   Total Protein 5.6 (*) 6.0 - 8.3 g/dL   Albumin 2.3 (*) 3.5 - 5.2 g/dL   AST 22  0 - 37 U/L   ALT 19  0 - 35 U/L   Alkaline Phosphatase 102  39 - 117 U/L   Total Bilirubin 0.1 (*) 0.3 - 1.2 mg/dL   GFR calc non Af Amer 34 (*) >90 mL/min   GFR calc Af Amer 40 (*) >90 mL/min   Comment: (NOTE)     The eGFR has been calculated using the CKD  EPI equation.     This calculation has not been validated in all clinical situations.     eGFR's persistently <90 mL/min signify possible Chronic Kidney     Disease.  PROTIME-INR     Status: None   Collection Time    09/06/13  5:00 AM      Result Value Range   Prothrombin Time 12.5  11.6 - 15.2 seconds   INR 0.95  0.00 - 1.49  GLUCOSE, CAPILLARY     Status: Abnormal   Collection Time    09/06/13  7:04 AM      Result Value Range   Glucose-Capillary 141 (*) 70 - 99 mg/dL   Comment 1 Notify RN    GLUCOSE, CAPILLARY     Status: Abnormal   Collection Time    09/06/13 11:40 AM      Result Value Range   Glucose-Capillary 118 (*) 70 - 99 mg/dL   Comment 1 Notify RN    GLUCOSE, CAPILLARY     Status: Abnormal   Collection Time    09/06/13  6:03 PM      Result Value Range   Glucose-Capillary 109 (*) 70 - 99 mg/dL  GLUCOSE, CAPILLARY     Status: Abnormal   Collection Time    09/06/13 11:39 PM      Result Value Range   Glucose-Capillary 140 (*) 70 - 99 mg/dL  GLUCOSE, CAPILLARY     Status: Abnormal   Collection Time    09/07/13  5:46 AM      Result Value Range   Glucose-Capillary 134 (*) 70 - 99 mg/dL  PROTIME-INR     Status: None   Collection Time    09/07/13  6:00 AM      Result Value Range   Prothrombin Time 13.2  11.6 - 15.2 seconds   INR 1.02  0.00 - 1.49    Patient Vitals for the past 24 hrs:  BP Temp Temp src Pulse Resp SpO2 Weight   09/07/13 0549 146/52 mmHg 97.4 F (36.3 C) Oral 53 16 92 % 40.8 kg (89 lb 15.2 oz)  09/06/13 1611 137/66 mmHg 97.5 F (36.4 C) Oral 55 16 97 % -    No intake or output data in the 24 hours ending 09/07/13 1010   Lab Results  Component Value Date   INR 1.02 09/07/2013   INR 0.95 09/06/2013   INR 0.99 08/30/2013    Constitutional: She is in no distress comfortable without complaints  Elderly female  HENT:  Head: Normocephalic and atraumatic.  Panda in right nares  Eyes: Conjunctivae are normal. Pupils are equal, round, and reactive to light.  Neck: Normal range of motion. Neck supple.  Cardiovascular: Normal rate and regular rhythm.  Pulmonary/Chest: Effort normal and breath sounds normal. No respiratory distress.  Abdominal: Soft. Bowel sounds are normal. She exhibits no distension. There is no tenderness.  Musculoskeletal: She exhibits no edema. Right > Left RTC shoulder contracture with poor active abduction, Severe R hallux valgus Neurological: She is alert and oriented to person, place,not time Speech soft, slow, hesitant  Able to follow simple commands. RUE weakness proximally.  Skin: Skin is warm and dry.       Assessment/Plan: 1. Functional deficits secondary to Left embolic MCA infarct which require 3+ hours per day of interdisciplinary therapy in a comprehensive inpatient rehab setting. 2. Severe dysphagia. Continue nasogastric tube feedings 3. Hypertension. Well controlled  LOS (Days) 2 A FACE TO FACE EVALUATION WAS PERFORMED  Rogelia Boga 09/07/2013, 10:04 AM

## 2013-09-07 NOTE — Progress Notes (Signed)
Speech Language Pathology Daily Session Note  Patient Details  Name: [name] MRN: [id number] Date of Birth: [birth year]  Today's Date: 09/07/2013 Time: 4098-1191 Time Calculation (min): 28 min  Short Term Goals: Week 1: SLP Short Term Goal 1 (Week 1): Patient will consume trials of puree with Mod clinician cues to perfrom 2-3 hard effortful swallows per bolus.  SLP Short Term Goal 2 (Week 1): Patient will perform oral motor and pharyngeal strengtheing exercises with Mod verbal cues. SLP Short Term Goal 3 (Week 1): Patient will increase speech intelligibilty at the phrase level with Mod verbal cues to increase vocal intensity and utilize over articulation.   Skilled Therapeutic Interventions: Skilled treatment session focused on addressing dysphagia goals.  Son present and perform pharyngeal strengthening exercises with patient upon SLP entering room.  SLP facilitated session with skilled observation of teaspoon trials of puree and honey-thick liquids via teaspoon with Mod cues to perform 2-3 swallows per bite.  Patient demonstrated no overt s/s of aspiration, though silent aspiration cannot be ruled out at bedside.  Patient appears to be making functional gain in her ability to initiate swallows ad overall pharyngeal endurance. As a result, it is recommended that she continue with current plan of care and participate in an objective assessment 9/30.      FIM:  Comprehension Comprehension Mode: Auditory Comprehension: 5-Follows basic conversation/direction: With no assist Expression Expression Mode: Verbal Expression: 4-Expresses basic 75 - 89% of the time/requires cueing 10 - 24% of the time. Needs helper to occlude trach/needs to repeat words. Social Interaction Social Interaction: 4-Interacts appropriately 75 - 89% of the time - Needs redirection for appropriate language or to initiate interaction. Problem Solving Problem Solving: 3-Solves basic 50 - 74% of the time/requires cueing 25  - 49% of the time Memory Memory: 3-Recognizes or recalls 50 - 74% of the time/requires cueing 25 - 49% of the time  Pain Pain Assessment Pain Assessment: No/denies pain  Therapy/Group: Individual Therapy  Charlane Ferretti., CCC-SLP [phone]  [name] 09/07/2013, 4:15 PM

## 2013-09-07 NOTE — Progress Notes (Signed)
Physical Therapy Note  Patient Details  Name: [name] MRN: [id number] Date of Birth: 01/12/1926 Today's Date: 09/07/2013  1000-1045 (45 minutes) individual Pain: no reported pain Focus of treatment: gait training with AD; therapeutic activities focused on standing balance/ ankle - hip strategies  Treatment: Pt in wc upon arrival; sit to stand min assist ; gait 60 feet RW min assist with increased forward trunk flexion + vcs to stay within AD for safety; standing - pt stand with decrease trunk/hip extension with posterior loss of balance with passive trunk extension; reaching activity to improve trunk extension (horseshoes on basketball hoop) and standing on soft surface to facilitate ankle/ hip strategies to prevent posterior loss of balance (pt uses stepping strategy to maintain static standing balance).    1300-1355 (55 minutes) individual Pain: no reported pain Focus of treatment: gait training; therapeutic activities focused on standing balance/ protective balance reactions Treatment: transfers stand/turn min assist ; sit to stand min assist for balance only; standing on firm surface and soft surface facilitating ankle and hip control with max tactile cues (pt continues to use stepping strategy or increased trunk flexion to prevent posterior loss of balance) ; standing horseshoe toss X 2 with tactile cues for trunk extension; gait - 80 feet X 2 RW with theraband behind patient to facilitate gait closer to AD ( min assist).   Stuart Guillen,JIM 09/07/2013, 10:18 AM

## 2013-09-08 LAB — GLUCOSE, CAPILLARY
Glucose-Capillary: 125 mg/dL — ABNORMAL HIGH (ref 70–99)
Glucose-Capillary: 125 mg/dL — ABNORMAL HIGH (ref 70–99)
Glucose-Capillary: 150 mg/dL — ABNORMAL HIGH (ref 70–99)
Glucose-Capillary: 151 mg/dL — ABNORMAL HIGH (ref 70–99)

## 2013-09-08 LAB — PROTIME-INR: INR: 0.95 (ref 0.00–1.49)

## 2013-09-08 MED ORDER — WARFARIN SODIUM 4 MG PO TABS
4.0000 mg | ORAL_TABLET | Freq: Once | ORAL | Status: AC
  Administered 2013-09-08: 4 mg via ORAL
  Filled 2013-09-08: qty 1

## 2013-09-08 MED ORDER — ASPIRIN EC 81 MG PO TBEC
81.0000 mg | DELAYED_RELEASE_TABLET | Freq: Every day | ORAL | Status: DC
  Administered 2013-09-09 – 2013-09-17 (×9): 81 mg via ORAL
  Filled 2013-09-08 (×10): qty 1

## 2013-09-08 NOTE — Progress Notes (Signed)
Patient ID: RAIZEL WESOLOWSKI, female   DOB: 03/07/1926, 79 y.o.   MRN: [id number] Patient ID: MAKENNA MACALUSO, female   DOB: [birth year], 79 y.o.   MRN: [id number] Subjective/Complaints:  5/75. 79 year old patient with a history of hypertension dementia and atrial fibrillation who is admitted with an acute embolic stroke. Patient remains on enteral feedings due to significant dysphagia  No complaints. Remains alert. Remains on aspirin 325 mg , Lovenox and Coumadin.  INR still subtherapeutic.   Objective: Vital Signs: Blood pressure 127/47, pulse 48, temperature 97.6 F (36.4 C), temperature source Oral, resp. rate 16, weight 40.8 kg (89 lb 15.2 oz), SpO2 95.00%. No results found. Results for orders placed during the hospital encounter of 09/05/13 (from the past 72 hour(s))  GLUCOSE, CAPILLARY     Status: Abnormal   Collection Time    09/05/13  9:01 PM      Result Value Range   Glucose-Capillary 133 (*) 70 - 99 mg/dL   Comment 1 Notify RN    GLUCOSE, CAPILLARY     Status: Abnormal   Collection Time    09/06/13 12:06 AM      Result Value Range   Glucose-Capillary 121 (*) 70 - 99 mg/dL   Comment 1 Notify RN    CBC WITH DIFFERENTIAL     Status: Abnormal   Collection Time    09/06/13  5:00 AM      Result Value Range   WBC 6.8  4.0 - 10.5 K/uL   RBC 3.02 (*) 3.87 - 5.11 MIL/uL   Hemoglobin 9.4 (*) 12.0 - 15.0 g/dL   HCT 71.6 (*) 96.7 - 89.3 %   MCV 96.4  78.0 - 100.0 fL   MCH 31.1  26.0 - 34.0 pg   MCHC 32.3  30.0 - 36.0 g/dL   RDW 81.0  17.5 - 10.2 %   Platelets 183  150 - 400 K/uL   Neutrophils Relative % 81 (*) 43 - 77 %   Neutro Abs 5.5  1.7 - 7.7 K/uL   Lymphocytes Relative 9 (*) 12 - 46 %   Lymphs Abs 0.6 (*) 0.7 - 4.0 K/uL   Monocytes Relative 7  3 - 12 %   Monocytes Absolute 0.5  0.1 - 1.0 K/uL   Eosinophils Relative 2  0 - 5 %   Eosinophils Absolute 0.2  0.0 - 0.7 K/uL   Basophils Relative 0  0 - 1 %   Basophils Absolute 0.0  0.0 - 0.1 K/uL  COMPREHENSIVE METABOLIC PANEL      Status: Abnormal   Collection Time    09/06/13  5:00 AM      Result Value Range   Sodium 140  135 - 145 mEq/L   Potassium 4.0  3.5 - 5.1 mEq/L   Chloride 107  96 - 112 mEq/L   CO2 24  19 - 32 mEq/L   Glucose, Bld 144 (*) 70 - 99 mg/dL   BUN 24 (*) 6 - 23 mg/dL   Creatinine, Ser 5.85 (*) 0.50 - 1.10 mg/dL   Calcium 7.8 (*) 8.4 - 10.5 mg/dL   Total Protein 5.6 (*) 6.0 - 8.3 g/dL   Albumin 2.3 (*) 3.5 - 5.2 g/dL   AST 22  0 - 37 U/L   ALT 19  0 - 35 U/L   Alkaline Phosphatase 102  39 - 117 U/L   Total Bilirubin 0.1 (*) 0.3 - 1.2 mg/dL   GFR calc non Af Amer 34 (*) >90  mL/min   GFR calc Af Amer 40 (*) >90 mL/min   Comment: (NOTE)     The eGFR has been calculated using the CKD EPI equation.     This calculation has not been validated in all clinical situations.     eGFR's persistently <90 mL/min signify possible Chronic Kidney     Disease.  PROTIME-INR     Status: None   Collection Time    09/06/13  5:00 AM      Result Value Range   Prothrombin Time 12.5  11.6 - 15.2 seconds   INR 0.95  0.00 - 1.49  GLUCOSE, CAPILLARY     Status: Abnormal   Collection Time    09/06/13  7:04 AM      Result Value Range   Glucose-Capillary 141 (*) 70 - 99 mg/dL   Comment 1 Notify RN    GLUCOSE, CAPILLARY     Status: Abnormal   Collection Time    09/06/13 11:40 AM      Result Value Range   Glucose-Capillary 118 (*) 70 - 99 mg/dL   Comment 1 Notify RN    GLUCOSE, CAPILLARY     Status: Abnormal   Collection Time    09/06/13  6:03 PM      Result Value Range   Glucose-Capillary 109 (*) 70 - 99 mg/dL  GLUCOSE, CAPILLARY     Status: Abnormal   Collection Time    09/06/13 11:39 PM      Result Value Range   Glucose-Capillary 140 (*) 70 - 99 mg/dL  GLUCOSE, CAPILLARY     Status: Abnormal   Collection Time    09/07/13  5:46 AM      Result Value Range   Glucose-Capillary 134 (*) 70 - 99 mg/dL  PROTIME-INR     Status: None   Collection Time    09/07/13  6:00 AM      Result Value Range    Prothrombin Time 13.2  11.6 - 15.2 seconds   INR 1.02  0.00 - 1.49  GLUCOSE, CAPILLARY     Status: Abnormal   Collection Time    09/07/13 12:00 PM      Result Value Range   Glucose-Capillary 138 (*) 70 - 99 mg/dL   Comment 1 Notify RN    GLUCOSE, CAPILLARY     Status: Abnormal   Collection Time    09/07/13  6:09 PM      Result Value Range   Glucose-Capillary 114 (*) 70 - 99 mg/dL   Comment 1 Notify RN    GLUCOSE, CAPILLARY     Status: Abnormal   Collection Time    09/08/13 12:13 AM      Result Value Range   Glucose-Capillary 150 (*) 70 - 99 mg/dL   Comment 1 Notify RN    PROTIME-INR     Status: None   Collection Time    09/08/13  5:20 AM      Result Value Range   Prothrombin Time 12.5  11.6 - 15.2 seconds   INR 0.95  0.00 - 1.49  GLUCOSE, CAPILLARY     Status: Abnormal   Collection Time    09/08/13  6:12 AM      Result Value Range   Glucose-Capillary 151 (*) 70 - 99 mg/dL   Comment 1 Notify RN      Patient Vitals for the past 24 hrs:  BP Temp Temp src Pulse Resp SpO2 Weight  09/08/13 0613 127/47 mmHg 97.6 F (36.4  C) Oral 48 16 95 % -  09/07/13 1500 128/83 mmHg 97.5 F (36.4 C) Oral 51 16 97 % -    No intake or output data in the 24 hours ending 09/08/13 1610   Lab Results  Component Value Date   INR 0.95 09/08/2013   INR 1.02 09/07/2013   INR 0.95 09/06/2013    Constitutional: She is in no distress comfortable without complaints  Elderly female  HENT:  Head: Normocephalic and atraumatic.  Panda in right nares  Eyes: Conjunctivae are normal. Pupils are equal, round, and reactive to light.  Neck: Normal range of motion. Neck supple.  Cardiovascular: Normal rate and regular rhythm.  Pulmonary/Chest: Effort normal and breath sounds normal. No respiratory distress.  Abdominal: Soft. Bowel sounds are normal. She exhibits no distension. There is no tenderness.  Musculoskeletal: She exhibits no edema. Right > Left RTC shoulder contracture with poor active abduction,  Severe R hallux valgus Neurological: She is alert and oriented to person, place,not time Speech soft, slow, hesitant  Able to follow simple commands. RUE weakness proximally.  Skin: Skin is warm and dry.       Assessment/Plan: 1. Functional deficits secondary to Left embolic MCA infarct which require 3+ hours per day of interdisciplinary therapy in a comprehensive inpatient rehab setting. 2. Severe dysphagia. Continue nasogastric tube feedings 3. Hypertension. Well controlled 4. Atrial fibrillation. Will decrease aspirin to 81 mg daily. Continue Lovenox until INR therapeutic  LOS (Days) 3 A FACE TO FACE EVALUATION WAS PERFORMED  Rogelia Boga 09/08/2013, 9:03 AM

## 2013-09-08 NOTE — Progress Notes (Signed)
ANTICOAGULATION CONSULT NOTE - Follow Up Consult  Pharmacy Consult for Warfarin Indication: New onset Afib  No Known Allergies  Patient Measurements: Weight:  (bed weight non-functional)  Vital Signs: Temp: 97.6 F (36.4 C) (09/28 0613) Temp src: Oral (09/28 0613) BP: 127/47 mmHg (09/28 0613) Pulse Rate: 48 (09/28 0613)  Labs:  Recent Labs  09/06/13 0500 09/07/13 0600 09/08/13 0520  HGB 9.4*  --   --   HCT 29.1*  --   --   PLT 183  --   --   LABPROT 12.5 13.2 12.5  INR 0.95 1.02 0.95  CREATININE 1.36*  --   --     The CrCl is unknown because both a height and weight (above a minimum accepted value) are required for this calculation.   Assessment: 79 y.o. F who continues on warfarin for new-onset Afib with a SUBtherapeutic INR this morning (INR 0.95 << 1.02). No CBC today -- no overt s/sx of bleeding noted. It is also noted that the patient is on Lovenox 30 mg SQ every 24 hours until INR >/= 2. Dosing cautiously given the patient's advanced age, concurrent use of amiodarone, and poor po intake (now on TFs). Will increase warfarin dose today.   Goal of Therapy:  INR 2-3   Plan:  1. Warfarin 4 mg x 1 dose at 1800 today 2. Will plan to d/c lovenox when the INR >/= 2 3. Consider d/cing or changing ASA from 325 mg to 81 mg when INR >2 4. Will continue to monitor for any signs/symptoms of bleeding and will follow up with PT/INR in the a.m.   Georgina Pillion, PharmD, BCPS Clinical Pharmacist Pager: [phone] 09/08/2013 8:44 AM

## 2013-09-09 ENCOUNTER — Inpatient Hospital Stay (HOSPITAL_COMMUNITY): Payer: Medicare Other | Admitting: Occupational Therapy

## 2013-09-09 ENCOUNTER — Inpatient Hospital Stay (HOSPITAL_COMMUNITY): Payer: Medicare Other | Admitting: Physical Therapy

## 2013-09-09 ENCOUNTER — Inpatient Hospital Stay (HOSPITAL_COMMUNITY): Payer: Medicare Other

## 2013-09-09 LAB — URINALYSIS, ROUTINE W REFLEX MICROSCOPIC
Bilirubin Urine: NEGATIVE
Glucose, UA: NEGATIVE mg/dL
Hgb urine dipstick: NEGATIVE
Ketones, ur: NEGATIVE mg/dL
Leukocytes, UA: NEGATIVE
Nitrite: NEGATIVE
Protein, ur: NEGATIVE mg/dL
pH: 8 (ref 5.0–8.0)

## 2013-09-09 LAB — GLUCOSE, CAPILLARY
Glucose-Capillary: 121 mg/dL — ABNORMAL HIGH (ref 70–99)
Glucose-Capillary: 145 mg/dL — ABNORMAL HIGH (ref 70–99)
Glucose-Capillary: 152 mg/dL — ABNORMAL HIGH (ref 70–99)
Glucose-Capillary: 153 mg/dL — ABNORMAL HIGH (ref 70–99)

## 2013-09-09 LAB — PROTIME-INR: Prothrombin Time: 12.8 seconds (ref 11.6–15.2)

## 2013-09-09 MED ORDER — WARFARIN SODIUM 4 MG PO TABS
4.0000 mg | ORAL_TABLET | Freq: Once | ORAL | Status: AC
  Administered 2013-09-09: 4 mg via ORAL
  Filled 2013-09-09: qty 1

## 2013-09-09 MED ORDER — FREE WATER
100.0000 mL | Freq: Every day | Status: DC
  Administered 2013-09-09 – 2013-09-10 (×5): 100 mL

## 2013-09-09 MED ORDER — INSULIN ASPART 100 UNIT/ML ~~LOC~~ SOLN
2.0000 [IU] | SUBCUTANEOUS | Status: DC | PRN
Start: 2013-09-09 — End: 2013-09-10
  Administered 2013-09-09: 2 [IU] via SUBCUTANEOUS

## 2013-09-09 MED ORDER — LIDOCAINE HCL 2 % EX GEL
CUTANEOUS | Status: DC | PRN
  Filled 2013-09-09: qty 5

## 2013-09-09 NOTE — Progress Notes (Signed)
ANTICOAGULATION CONSULT NOTE - Follow Up Consult  Pharmacy Consult for coumadin Indication: atrial fibrillation  No Known Allergies  Patient Measurements: Weight: 95 lb 10.9 oz (43.4 kg) Heparin Dosing Weight:   Vital Signs: Temp: 98.4 F (36.9 C) (09/29 0605) Temp src: Oral (09/29 0605) BP: 126/53 mmHg (09/29 0605) Pulse Rate: 48 (09/29 0605)  Labs:  Recent Labs  09/07/13 0600 09/08/13 0520 09/09/13 0500  LABPROT 13.2 12.5 12.8  INR 1.02 0.95 0.98    The CrCl is unknown because both a height and weight (above a minimum accepted value) are required for this calculation.   Medications:  Scheduled:  . amiodarone  200 mg Oral BID  . antiseptic oral rinse  15 mL Mouth Rinse q12n4p  . aspirin EC  81 mg Oral Daily  . atorvastatin  40 mg Oral q1800  . calcitRIOL  0.25 mcg Per Tube Daily  . chlorhexidine  15 mL Mouth Rinse BID  . coumadin book   Does not apply Once  . enoxaparin (LOVENOX) injection  30 mg Subcutaneous Q24H  . furosemide  20 mg Per Tube Daily  . insulin aspart  0-9 Units Subcutaneous TID WC  . pantoprazole sodium  40 mg Per Tube Daily  . sodium bicarbonate  650 mg Per Tube QID  . Warfarin - Pharmacist Dosing Inpatient   Does not apply q1800   Infusions:  . feeding supplement (JEVITY 1.2 CAL) 1,000 mL (09/08/13 1838)    Assessment: 79 yo female with afib is currently on subtherapeutic coumadin.  INR is 0.98 today after one dose of coumadin 4mg  po x1 yesterday. Goal of Therapy:  INR 2-3 Monitor platelets by anticoagulation protocol: Yes   Plan:  1) Repeat coumadin 4mg  po x1 today 2) INR in am 3) d/c lovenox when INR >2  Crickett Abbett, Tsz-Yin 09/09/2013,7:08 AM

## 2013-09-09 NOTE — Progress Notes (Signed)
Physical Therapy Session Note  Patient Details  Name: [name] MRN: [id number] Date of Birth: [birth year]  Today's Date: 09/09/2013 Time: 1000-1100 Time Calculation (min): 60 min  Short Term Goals: Week 1:  PT Short Term Goal 1 (Week 1): STG's=LTG's due to short ELOS  Skilled Therapeutic Interventions/Progress Updates:   Pt resting in bed secondary to feeling cold; pt performed supine > sit EOB with max encouragement but min A to perform.  Performed transfers stand pivot bed <> w/c with HHA and min-mod A for controlled lowering to seat.  Pt performed w/c mobility training in controlled environment x 100' with bilat UE propulsion for coordination and RUE strengthening with mod-max A overall secondary to RUE weakness and tendency to veer to R.  Pt fatigued quickly.  Performed BERG balance assessment; see below for details.  Discussed falls risk with pt and discussed use of AD in home.  Pt reports that it is too narrow to use RW; may assess safety and use of SPC for household mobility to decreased burden of care on pt son yet secondary to memory deficits pt may have difficulty learning new AD.  Returned to bed with stand pivot HHA and sit > supine with min-mod A.      Therapy Documentation Precautions:  Precautions Precautions: Fall Precaution Comments: Panda in R nare, memory issues (hx of dementia), pt with both urinary and bowel incontinence. Restrictions Weight Bearing Restrictions: No Vital Signs: Therapy Vitals Pulse Rate: 50 BP: 129/69 mmHg Patient Position, if appropriate: Sitting Pain: Pain Assessment Pain Assessment: 0-10 Pain Score: 0-No pain Patients Stated Pain Goal: 0 Multiple Pain Sites: No Balance: Standardized Balance Assessment Standardized Balance Assessment: Berg Balance Test Berg Balance Test Sit to Stand: Able to stand  independently using hands Standing Unsupported: Able to stand 30 seconds unsupported Sitting with Back Unsupported but Feet Supported on  Floor or Stool: Able to sit safely and securely 2 minutes Stand to Sit: Controls descent by using hands Transfers: Able to transfer with verbal cueing and /or supervision Standing Unsupported with Eyes Closed: Able to stand 10 seconds with supervision Standing Ubsupported with Feet Together: Needs help to attain position but able to stand for 30 seconds with feet together From Standing, Reach Forward with Outstretched Arm: Can reach forward >12 cm safely (5") From Standing Position, Pick up Object from Floor: Able to pick up shoe, needs supervision From Standing Position, Turn to Look Behind Over each Shoulder: Turn sideways only but maintains balance Turn 360 Degrees: Needs assistance while turning Standing Unsupported, Alternately Place Feet on Step/Stool: Able to complete >2 steps/needs minimal assist Standing Unsupported, One Foot in Front: Needs help to step but can hold 15 seconds Standing on One Leg: Unable to try or needs assist to prevent fall Total Score: 28 Patient demonstrates increased fall risk as noted by score of  28/56 on Berg Balance Scale.  (<36= high risk for falls, close to 100%; 37-45 significant >80%; 46-51 moderate >50%; 52-55 lower >25%).  Pt required multiple seated rest breaks during BERG secondary to fatigue and fear of falling.    See FIM for current functional status  Therapy/Group: Individual Therapy  [name] [name] 09/09/2013, 12:29 PM

## 2013-09-09 NOTE — Progress Notes (Signed)
Subjective/Complaints: No complaints Oriented to Hospital, self, stroke  Review of Systems - Not accurate secondary to dementia and aphasia  Objective: Vital Signs: Blood pressure 126/53, pulse 48, temperature 98.4 F (36.9 C), temperature source Oral, resp. rate 17, weight 43.4 kg (95 lb 10.9 oz), SpO2 85.00%. No results found. Results for orders placed during the hospital encounter of 09/05/13 (from the past 72 hour(s))  GLUCOSE, CAPILLARY     Status: Abnormal   Collection Time    09/06/13 11:40 AM      Result Value Range   Glucose-Capillary 118 (*) 70 - 99 mg/dL   Comment 1 Notify RN    GLUCOSE, CAPILLARY     Status: Abnormal   Collection Time    09/06/13  6:03 PM      Result Value Range   Glucose-Capillary 109 (*) 70 - 99 mg/dL  GLUCOSE, CAPILLARY     Status: Abnormal   Collection Time    09/06/13 11:39 PM      Result Value Range   Glucose-Capillary 140 (*) 70 - 99 mg/dL  GLUCOSE, CAPILLARY     Status: Abnormal   Collection Time    09/07/13  5:46 AM      Result Value Range   Glucose-Capillary 134 (*) 70 - 99 mg/dL  PROTIME-INR     Status: None   Collection Time    09/07/13  6:00 AM      Result Value Range   Prothrombin Time 13.2  11.6 - 15.2 seconds   INR 1.02  0.00 - 1.49  GLUCOSE, CAPILLARY     Status: Abnormal   Collection Time    09/07/13 12:00 PM      Result Value Range   Glucose-Capillary 138 (*) 70 - 99 mg/dL   Comment 1 Notify RN    GLUCOSE, CAPILLARY     Status: Abnormal   Collection Time    09/07/13  6:09 PM      Result Value Range   Glucose-Capillary 114 (*) 70 - 99 mg/dL   Comment 1 Notify RN    GLUCOSE, CAPILLARY     Status: Abnormal   Collection Time    09/08/13 12:13 AM      Result Value Range   Glucose-Capillary 150 (*) 70 - 99 mg/dL   Comment 1 Notify RN    PROTIME-INR     Status: None   Collection Time    09/08/13  5:20 AM      Result Value Range   Prothrombin Time 12.5  11.6 - 15.2 seconds   INR 0.95  0.00 - 1.49  GLUCOSE,  CAPILLARY     Status: Abnormal   Collection Time    09/08/13  6:12 AM      Result Value Range   Glucose-Capillary 151 (*) 70 - 99 mg/dL   Comment 1 Notify RN    GLUCOSE, CAPILLARY     Status: Abnormal   Collection Time    09/08/13 12:00 PM      Result Value Range   Glucose-Capillary 125 (*) 70 - 99 mg/dL   Comment 1 Notify RN    GLUCOSE, CAPILLARY     Status: Abnormal   Collection Time    09/08/13  6:05 PM      Result Value Range   Glucose-Capillary 125 (*) 70 - 99 mg/dL   Comment 1 Notify RN    GLUCOSE, CAPILLARY     Status: Abnormal   Collection Time    09/09/13 12:00 AM  Result Value Range   Glucose-Capillary 121 (*) 70 - 99 mg/dL   Comment 1 Notify RN    PROTIME-INR     Status: None   Collection Time    09/09/13  5:00 AM      Result Value Range   Prothrombin Time 12.8  11.6 - 15.2 seconds   INR 0.98  0.00 - 1.49      Nursing note and vitals reviewed.  Constitutional: She is oriented to person, place, and time. She appears cachectic.  Elderly female  HENT:  Head: Normocephalic and atraumatic.  Panda in right nares  Eyes: Conjunctivae are normal. Pupils are equal, round, and reactive to light.  Neck: Normal range of motion. Neck supple.  Cardiovascular: Normal rate and regular rhythm.  Pulmonary/Chest: Effort normal and breath sounds normal. No respiratory distress.  Abdominal: Soft. Bowel sounds are normal. She exhibits no distension. There is no tenderness.  Musculoskeletal: She exhibits no edema. Right > Left RTC shoulder contracture with poor active abduction, Severe R hallux valgus Neurological: She is alert and oriented to person, place,not time Speech soft, slow, hesitant with moderately dysarthria. Able to follow simple commands. RUE weakness proximally.  Skin: Skin is warm and dry.  Neuro:  Eyes without evidence of nystagmus  Tone is normal without evidence of spasticity  Cerebellar exam shows no evidence of ataxia on finger nose finger Left side,  unableRight secondary to shoulder pain/contracture  No evidence of trunkal ataxia  Motor strength is in 3-/5 R deltoid, 5/5 Left deltoid deltoid, 5/5 biceps, triceps, finger flexors and extensors, wrist flexors and extensors, hip flexors, knee flexors and extensors, ankle dorsiflexors, plantar flexors, toe flexors and extensors      Assessment/Plan: 1. Functional deficits secondary to Left embolic MCA infarct which require 3+ hours per day of interdisciplinary therapy in a comprehensive inpatient rehab setting. Physiatrist is providing close team supervision and 24 hour management of active medical problems listed below. Physiatrist and rehab team continue to assess barriers to discharge/monitor patient progress toward functional and medical goals. FIM: FIM - Bathing Bathing Steps Patient Completed: Chest;Right Arm;Abdomen;Front perineal area;Right upper leg;Left upper leg Bathing: 4: Min-Patient completes 8-9 39f 10 parts or 75+ percent  FIM - Upper Body Dressing/Undressing Upper body dressing/undressing steps patient completed: Thread/unthread right sleeve of pullover shirt/dresss;Thread/unthread left sleeve of pullover shirt/dress Upper body dressing/undressing: 3: Mod-Patient completed 50-74% of tasks FIM - Lower Body Dressing/Undressing Lower body dressing/undressing steps patient completed: Pull pants up/down Lower body dressing/undressing: 2: Max-Patient completed 25-49% of tasks  FIM - Toileting Toileting steps completed by patient: Adjust clothing prior to toileting;Performs perineal hygiene Toileting: 3: Mod-Patient completed 2 of 3 steps  FIM - Diplomatic Services operational officer Devices: Grab bars Toilet Transfers: 4-To toilet/BSC: Min A (steadying Pt. > 75%);4-From toilet/BSC: Min A (steadying Pt. > 75%)  FIM - Bed/Chair Transfer Bed/Chair Transfer Assistive Devices: Arm rests;Bed rails Bed/Chair Transfer: 5: Supine > Sit: Supervision (verbal cues/safety  issues);4: Bed > Chair or W/C: Min A (steadying Pt. > 75%)  FIM - Locomotion: Wheelchair Distance: 15' Locomotion: Wheelchair: 1: Travels less than 50 ft with minimal assistance (Pt.>75%) FIM - Locomotion: Ambulation Locomotion: Ambulation Assistive Devices: Designer, industrial/product Ambulation/Gait Assistance: 4: Min assist Locomotion: Ambulation: 2: Travels 50 - 149 ft with minimal assistance (Pt.>75%)  Comprehension Comprehension Mode: Auditory Comprehension: 5-Follows basic conversation/direction: With no assist  Expression Expression Mode: Verbal Expression: 5-Expresses basic needs/ideas: With no assist  Social Interaction Social Interaction: 4-Interacts appropriately 75 - 89%  of the time - Needs redirection for appropriate language or to initiate interaction.  Problem Solving Problem Solving: 3-Solves basic 50 - 74% of the time/requires cueing 25 - 49% of the time  Memory Memory: 3-Recognizes or recalls 50 - 74% of the time/requires cueing 25 - 49% of the time  Medical Problem List and Plan:  1. DVT Prophylaxis/Anticoagulation: Pharmaceutical: Coumadin , INR remains low, lovenox until INR>2.0 2. Pain Management: N/A.  3. Mood: No signs of distress. Has supportive family. Will have LCSW follow for evaluation.  4. Neuropsych: This patient is intermittently capable of making decisions on her own behalf.  5. Severe dysphagia: Continue TF and water flushes. Resume lasix and monitor renal status closely.  6. H/o PUD/stomach cancer: add PPI  7. CKD: Baseline BUN/Cr--41/1.73. Resume sodium bicarb and rocaltrol.   LOS (Days) 4 A FACE TO FACE EVALUATION WAS PERFORMED  [name] 09/09/2013, 8:40 AM

## 2013-09-09 NOTE — Progress Notes (Signed)
Speech Language Pathology Daily Session Note  Patient Details  Name: [name] MRN: [id number] Date of Birth: Feb 08, 1926  Today's Date: 09/09/2013 Time: 0865-7846 Time Calculation (min): 45 min  Short Term Goals: Week 1: SLP Short Term Goal 1 (Week 1): Patient will consume trials of puree with Mod clinician cues to perfrom 2-3 hard effortful swallows per bolus.  SLP Short Term Goal 2 (Week 1): Patient will perform oral motor and pharyngeal strengtheing exercises with Mod verbal cues. SLP Short Term Goal 3 (Week 1): Patient will increase speech intelligibilty at the phrase level with Mod verbal cues to increase vocal intensity and utilize over articulation.   Skilled Therapeutic Interventions: Treatment focused on swallowing goals. SLP facilitated session with therapeutic trials of puree with Max cues for multiple swallows and a wet vocal quality x1, which returned to baseline with a cued throat clear. Pt completed pharyngeal strengthening exercises with Mod cues. Pt's son present for the majority of treatment, and both pt and son were educated on the plan for MBS tomorrow.    FIM:  Comprehension Comprehension Mode: Auditory Comprehension: 5-Follows basic conversation/direction: With extra time/assistive device Expression Expression Mode: Verbal Expression: 5-Expresses basic needs/ideas: With extra time/assistive device Social Interaction Social Interaction: 4-Interacts appropriately 75 - 89% of the time - Needs redirection for appropriate language or to initiate interaction. Problem Solving Problem Solving: 3-Solves basic 50 - 74% of the time/requires cueing 25 - 49% of the time Memory Memory: 3-Recognizes or recalls 50 - 74% of the time/requires cueing 25 - 49% of the time FIM - Eating Eating Activity: 5: Needs verbal cues/supervision  Pain Pain Assessment Pain Assessment: No/denies pain  Therapy/Group: Individual Therapy  [name] 09/09/2013, 4:31 PM  [name], M.A. CCC-SLP [phone]

## 2013-09-09 NOTE — Progress Notes (Signed)
Occupational Therapy Session Note  Patient Details  Name: [name] MRN: [id number] Date of Birth: 1926-06-30  Today's Date: 09/09/2013 Time: 7829-5621 and 3086-5784 Time Calculation (min): 60 min and 27 min  Short Term Goals: Week 1:  OT Short Term Goal 1 (Week 1): Pt will complete stand pivot transfer with min assist OT Short Term Goal 2 (Week 1): Pt will complete UB dressing with supervision OT Short Term Goal 3 (Week 1): Pt will complete bathing with min assist at sit <> stand level OT Short Term Goal 4 (Week 1): Pt will complete LB dressing with mod assist  Skilled Therapeutic Interventions/Progress Updates:    1) Pt seen for ADL retraining with focus on transfers, sit > stand, and decreasing assistance with self-care tasks of bathing and dressing.  Pt completed bathing at sink with setup assist and cues to ensure thoroughness.  Pt declined need to use bathroom, however when standing to wash perineal area pt began urinating.  Pt able to stop and transfer to toilet where she completed urinating and had a bowel movement.  Min assist stand pivot transfer to/from toilet.  Pt close supervision with sit > stand, however stands in flexed position and very reliant on UE for balance in standing.  Pt requested to return to bed at end of session.    2) Pt seen for 1:1 OT with focus on functional use of RUE.  Pt with decreased shoulder flexion and abduction, engaged in towel glides on tray table to increase ROM in supported plane.  Pt with attempts to utilize trunk to compensate for decreased flexion, tactile cues at trunk to limit trunk flexion to facilitate true shoulder flexion.  Engaged in peg activity with pt able to follow 2 step directions after demonstration with use of RUE with no difficulty.  Therapy Documentation Precautions:  Precautions Precautions: Fall Precaution Comments: Panda in R nare, memory issues (hx of dementia), pt with both urinary and bowel  incontinence. Restrictions Weight Bearing Restrictions: No General:   Vital Signs: Therapy Vitals Pulse Rate: 50 BP: 129/69 mmHg Patient Position, if appropriate: Sitting Pain: Pain Assessment Pain Assessment: 0-10 Pain Score: 0-No pain Patients Stated Pain Goal: 0 Multiple Pain Sites: No  See FIM for current functional status  Therapy/Group: Individual Therapy  Rosalio Loud 09/09/2013, 12:22 PM

## 2013-09-10 ENCOUNTER — Inpatient Hospital Stay (HOSPITAL_COMMUNITY): Payer: Medicare Other | Admitting: Occupational Therapy

## 2013-09-10 ENCOUNTER — Inpatient Hospital Stay (HOSPITAL_COMMUNITY): Payer: Medicare Other | Admitting: Speech Pathology

## 2013-09-10 ENCOUNTER — Inpatient Hospital Stay (HOSPITAL_COMMUNITY): Payer: Medicare Other | Admitting: Physical Therapy

## 2013-09-10 ENCOUNTER — Inpatient Hospital Stay (HOSPITAL_COMMUNITY): Payer: Medicare Other

## 2013-09-10 LAB — CBC
HCT: 28.6 % — ABNORMAL LOW (ref 36.0–46.0)
Hemoglobin: 9.2 g/dL — ABNORMAL LOW (ref 12.0–15.0)
MCH: 31.1 pg (ref 26.0–34.0)
Platelets: 209 10*3/uL (ref 150–400)
RBC: 2.96 MIL/uL — ABNORMAL LOW (ref 3.87–5.11)
WBC: 7.3 10*3/uL (ref 4.0–10.5)

## 2013-09-10 LAB — GLUCOSE, CAPILLARY
Glucose-Capillary: 147 mg/dL — ABNORMAL HIGH (ref 70–99)
Glucose-Capillary: 163 mg/dL — ABNORMAL HIGH (ref 70–99)

## 2013-09-10 LAB — URINE CULTURE

## 2013-09-10 LAB — BASIC METABOLIC PANEL
CO2: 28 mEq/L (ref 19–32)
Chloride: 97 mEq/L (ref 96–112)
Glucose, Bld: 143 mg/dL — ABNORMAL HIGH (ref 70–99)
Potassium: 5 mEq/L (ref 3.5–5.1)
Sodium: 134 mEq/L — ABNORMAL LOW (ref 135–145)

## 2013-09-10 LAB — PROTIME-INR: Prothrombin Time: 12.6 seconds (ref 11.6–15.2)

## 2013-09-10 MED ORDER — AMIODARONE HCL 100 MG PO TABS
100.0000 mg | ORAL_TABLET | Freq: Two times a day (BID) | ORAL | Status: DC
  Filled 2013-09-10 (×2): qty 1

## 2013-09-10 MED ORDER — FREE WATER
200.0000 mL | Freq: Every day | Status: DC
  Administered 2013-09-10: 200 mL

## 2013-09-10 MED ORDER — AMIODARONE HCL 200 MG PO TABS
200.0000 mg | ORAL_TABLET | Freq: Every day | ORAL | Status: DC
  Administered 2013-09-11 – 2013-09-17 (×7): 200 mg via ORAL
  Filled 2013-09-10 (×8): qty 1

## 2013-09-10 MED ORDER — WARFARIN SODIUM 6 MG PO TABS
6.0000 mg | ORAL_TABLET | Freq: Once | ORAL | Status: AC
  Administered 2013-09-10: 6 mg via ORAL
  Filled 2013-09-10: qty 1

## 2013-09-10 NOTE — Progress Notes (Signed)
Subjective/Complaints: No complaints, tolerating therapy Oriented to Hospital, self, stroke  Review of Systems - Not accurate secondary to dementia and aphasia  Objective: Vital Signs: Blood pressure 157/70, pulse 48, temperature 97.8 F (36.6 C), temperature source Oral, resp. rate 17, weight 44.8 kg (98 lb 12.3 oz), SpO2 97.00%. No results found. Results for orders placed during the hospital encounter of 09/05/13 (from the past 72 hour(s))  GLUCOSE, CAPILLARY     Status: Abnormal   Collection Time    09/07/13  6:09 PM      Result Value Range   Glucose-Capillary 114 (*) 70 - 99 mg/dL   Comment 1 Notify RN    GLUCOSE, CAPILLARY     Status: Abnormal   Collection Time    09/08/13 12:13 AM      Result Value Range   Glucose-Capillary 150 (*) 70 - 99 mg/dL   Comment 1 Notify RN    PROTIME-INR     Status: None   Collection Time    09/08/13  5:20 AM      Result Value Range   Prothrombin Time 12.5  11.6 - 15.2 seconds   INR 0.95  0.00 - 1.49  GLUCOSE, CAPILLARY     Status: Abnormal   Collection Time    09/08/13  6:12 AM      Result Value Range   Glucose-Capillary 151 (*) 70 - 99 mg/dL   Comment 1 Notify RN    GLUCOSE, CAPILLARY     Status: Abnormal   Collection Time    09/08/13 12:00 PM      Result Value Range   Glucose-Capillary 125 (*) 70 - 99 mg/dL   Comment 1 Notify RN    GLUCOSE, CAPILLARY     Status: Abnormal   Collection Time    09/08/13  6:05 PM      Result Value Range   Glucose-Capillary 125 (*) 70 - 99 mg/dL   Comment 1 Notify RN    GLUCOSE, CAPILLARY     Status: Abnormal   Collection Time    09/09/13 12:00 AM      Result Value Range   Glucose-Capillary 121 (*) 70 - 99 mg/dL   Comment 1 Notify RN    PROTIME-INR     Status: None   Collection Time    09/09/13  5:00 AM      Result Value Range   Prothrombin Time 12.8  11.6 - 15.2 seconds   INR 0.98  0.00 - 1.49  GLUCOSE, CAPILLARY     Status: Abnormal   Collection Time    09/09/13  6:02 AM      Result  Value Range   Glucose-Capillary 163 (*) 70 - 99 mg/dL   Comment 1 Notify RN    URINALYSIS, ROUTINE W REFLEX MICROSCOPIC     Status: None   Collection Time    09/09/13 11:57 AM      Result Value Range   Color, Urine YELLOW  YELLOW   APPearance CLEAR  CLEAR   Specific Gravity, Urine 1.010  1.005 - 1.030   pH 8.0  5.0 - 8.0   Glucose, UA NEGATIVE  NEGATIVE mg/dL   Hgb urine dipstick NEGATIVE  NEGATIVE   Bilirubin Urine NEGATIVE  NEGATIVE   Ketones, ur NEGATIVE  NEGATIVE mg/dL   Protein, ur NEGATIVE  NEGATIVE mg/dL   Urobilinogen, UA 0.2  0.0 - 1.0 mg/dL   Nitrite NEGATIVE  NEGATIVE   Leukocytes, UA NEGATIVE  NEGATIVE   Comment: MICROSCOPIC NOT  DONE ON URINES WITH NEGATIVE PROTEIN, BLOOD, LEUKOCYTES, NITRITE, OR GLUCOSE <1000 mg/dL.  URINE CULTURE     Status: None   Collection Time    09/09/13 11:57 AM      Result Value Range   Specimen Description URINE, CATHETERIZED     Special Requests NONE     Culture  Setup Time       Value: 09/09/2013 15:04     Performed at [facility] Count       Value: NO GROWTH     Performed at [facility]   Culture       Value: NO GROWTH     Performed at [facility]   Report Status 09/10/2013 FINAL    GLUCOSE, CAPILLARY     Status: Abnormal   Collection Time    09/09/13  1:09 PM      Result Value Range   Glucose-Capillary 153 (*) 70 - 99 mg/dL  GLUCOSE, CAPILLARY     Status: Abnormal   Collection Time    09/09/13  5:22 PM      Result Value Range   Glucose-Capillary 145 (*) 70 - 99 mg/dL   Comment 1 Notify RN    GLUCOSE, CAPILLARY     Status: Abnormal   Collection Time    09/09/13  7:18 PM      Result Value Range   Glucose-Capillary 152 (*) 70 - 99 mg/dL   Comment 1 Notify RN    GLUCOSE, CAPILLARY     Status: Abnormal   Collection Time    09/10/13 12:08 AM      Result Value Range   Glucose-Capillary 147 (*) 70 - 99 mg/dL  GLUCOSE, CAPILLARY     Status: Abnormal   Collection Time    09/10/13  7:09 AM       Result Value Range   Glucose-Capillary 140 (*) 70 - 99 mg/dL   Comment 1 Notify RN    PROTIME-INR     Status: None   Collection Time    09/10/13  8:08 AM      Result Value Range   Prothrombin Time 12.6  11.6 - 15.2 seconds   INR 0.96  0.00 - 1.49  CBC     Status: Abnormal   Collection Time    09/10/13  8:08 AM      Result Value Range   WBC 7.3  4.0 - 10.5 K/uL   RBC 2.96 (*) 3.87 - 5.11 MIL/uL   Hemoglobin 9.2 (*) 12.0 - 15.0 g/dL   HCT 40.9 (*) 81.1 - 91.4 %   MCV 96.6  78.0 - 100.0 fL   MCH 31.1  26.0 - 34.0 pg   MCHC 32.2  30.0 - 36.0 g/dL   RDW 78.2  95.6 - 21.3 %   Platelets 209  150 - 400 K/uL  BASIC METABOLIC PANEL     Status: Abnormal   Collection Time    09/10/13  8:08 AM      Result Value Range   Sodium 134 (*) 135 - 145 mEq/L   Potassium 5.0  3.5 - 5.1 mEq/L   Chloride 97  96 - 112 mEq/L   CO2 28  19 - 32 mEq/L   Glucose, Bld 143 (*) 70 - 99 mg/dL   BUN 40 (*) 6 - 23 mg/dL   Creatinine, Ser 0.86 (*) 0.50 - 1.10 mg/dL   Calcium 8.6  8.4 - 57.8 mg/dL   GFR calc non  Af Amer 28 (*) >90 mL/min   GFR calc Af Amer 33 (*) >90 mL/min   Comment: (NOTE)     The eGFR has been calculated using the CKD EPI equation.     This calculation has not been validated in all clinical situations.     eGFR's persistently <90 mL/min signify possible Chronic Kidney     Disease.  GLUCOSE, CAPILLARY     Status: Abnormal   Collection Time    09/10/13 11:53 AM      Result Value Range   Glucose-Capillary 115 (*) 70 - 99 mg/dL   Comment 1 Notify RN        Nursing note and vitals reviewed.  Constitutional: She is oriented to person, place, and time. She appears cachectic.  Elderly female  HENT:  Head: Normocephalic and atraumatic.  Panda in right nares  Eyes: Conjunctivae are normal. Pupils are equal, round, and reactive to light.  Neck: Normal range of motion. Neck supple.  Cardiovascular: Normal rate and regular rhythm.  Pulmonary/Chest: Effort normal and breath sounds  normal. No respiratory distress.  Abdominal: Soft. Bowel sounds are normal. She exhibits no distension. There is no tenderness.  Musculoskeletal: She exhibits no edema. Right > Left RTC shoulder contracture with poor active abduction, Severe R hallux valgus Neurological: She is alert and oriented to person, place,not time Speech soft, slow, hesitant with moderately dysarthria. Able to follow simple commands. RUE weakness proximally.  Skin: Skin is warm and dry.  Neuro:  Eyes without evidence of nystagmus  Tone is normal without evidence of spasticity  Cerebellar exam shows no evidence of ataxia on finger nose finger Left side, unableRight secondary to shoulder pain/contracture  No evidence of trunkal ataxia  Motor strength is in 3-/5 R deltoid, 5/5 Left deltoid deltoid, 5/5 biceps, triceps, finger flexors and extensors, wrist flexors and extensors, hip flexors, knee flexors and extensors, ankle dorsiflexors, plantar flexors, toe flexors and extensors      Assessment/Plan: 1. Functional deficits secondary to Left embolic MCA infarct which require 3+ hours per day of interdisciplinary therapy in a comprehensive inpatient rehab setting. Physiatrist is providing close team supervision and 24 hour management of active medical problems listed below. Physiatrist and rehab team continue to assess barriers to discharge/monitor patient progress toward functional and medical goals. FIM: FIM - Bathing Bathing Steps Patient Completed: Chest;Right Arm;Abdomen;Front perineal area;Right upper leg;Left upper leg;Left Arm Bathing: 3: Mod-Patient completes 5-7 41f 10 parts or 50-74%  FIM - Upper Body Dressing/Undressing Upper body dressing/undressing steps patient completed: Thread/unthread right sleeve of pullover shirt/dresss;Thread/unthread left sleeve of pullover shirt/dress;Put head through opening of pull over shirt/dress;Pull shirt over trunk Upper body dressing/undressing: 5: Set-up assist to: Obtain  clothing/put away FIM - Lower Body Dressing/Undressing Lower body dressing/undressing steps patient completed: Thread/unthread right underwear leg;Thread/unthread left underwear leg;Thread/unthread right pants leg;Thread/unthread left pants leg;Pull pants up/down Lower body dressing/undressing: 3: Mod-Patient completed 50-74% of tasks  FIM - Toileting Toileting steps completed by patient: Performs perineal hygiene;Adjust clothing after toileting Toileting: 3: Mod-Patient completed 2 of 3 steps  FIM - Diplomatic Services operational officer Devices: Grab bars Toilet Transfers: 4-To toilet/BSC: Min A (steadying Pt. > 75%);4-From toilet/BSC: Min A (steadying Pt. > 75%)  FIM - Bed/Chair Transfer Bed/Chair Transfer Assistive Devices: Therapist, occupational: 5: Supine > Sit: Supervision (verbal cues/safety issues);5: Sit > Supine: Supervision (verbal cues/safety issues);5: Bed > Chair or W/C: Supervision (verbal cues/safety issues);5: Chair or W/C > Bed: Supervision (verbal cues/safety issues)  FIM -  Locomotion: Wheelchair Distance: 100 Locomotion: Wheelchair: 0: Activity did not occur FIM - Locomotion: Ambulation Locomotion: Ambulation Assistive Devices: Designer, industrial/product Ambulation/Gait Assistance: 4: Min assist Locomotion: Ambulation: 4: Travels 150 ft or more with minimal assistance (Pt.>75%)  Comprehension Comprehension Mode: Auditory Comprehension: 5-Follows basic conversation/direction: With extra time/assistive device  Expression Expression Mode: Verbal Expression: 5-Expresses basic needs/ideas: With extra time/assistive device  Social Interaction Social Interaction: 4-Interacts appropriately 75 - 89% of the time - Needs redirection for appropriate language or to initiate interaction.  Problem Solving Problem Solving: 3-Solves basic 50 - 74% of the time/requires cueing 25 - 49% of the time  Memory Memory: 3-Recognizes or recalls 50 - 74% of the time/requires cueing 25  - 49% of the time  Medical Problem List and Plan:  1. DVT Prophylaxis/Anticoagulation: Pharmaceutical: Coumadin , INR remains low, lovenox until INR>2.0 2. Pain Management: N/A.  3. Mood: No signs of distress. Has supportive family. Will have LCSW follow for evaluation.  4. Neuropsych: This patient is intermittently capable of making decisions on her own behalf.  5. Severe dysphagia: Continue TF and water flushes. Hold  Lasix for azotemia and monitor renal status closely.  6. H/o PUD/stomach cancer: add PPI  7. CKD: Baseline BUN/Cr--41/1.73. Resume sodium bicarb and rocaltrol.   LOS (Days) 5 A FACE TO FACE EVALUATION WAS PERFORMED  Claudette Laws E 09/10/2013, 5:27 PM

## 2013-09-10 NOTE — Procedures (Signed)
Objective Swallowing Evaluation:    Patient Details  Name: [name] MRN: [id number] Date of Birth: 14-Jun-1926  Today's Date: 09/10/2013 Time: 0900-0930 Time Calculation (min): 30 min  Past Medical History:  Past Medical History  Diagnosis Date  . Dementia   . Hypertension   . Ulcer     Hx of  . Stomach cancer   . Snoring   . Fracture, intertrochanteric, left femur 03/25/2012  . Fracture, intertrochanteric, right femur 06/2008    s/p closed reduction and internal fixation  . Nodule of left lung     followed by Dr. Vassie Loll // LLL spiculated nodule noted per CT in 2012 1.5x1.6x2.4, PET in 06/03/2011 was low uptake.   Past Surgical History:  Past Surgical History  Procedure Laterality Date  . Stomach surgery      3/4 of stomach removed due to stomach cancer  . Hip surgery  2009    right  . Total hip arthroplasty  03/29/2012    Procedure: TOTAL HIP ARTHROPLASTY;  Surgeon: Eulas Post, MD;  Location: [facility] OR;  Service: Orthopedics;  Laterality: Left;  Left Hip Hemiarthroplasty   HPI:  [name] is an 79 y.o. RH-female with a history of hypertension, dementia, stomach cancer and peptic ulcer disease, presenting with new onset of inability to speak. Patient reportedly was inattentive to those around her and attempted to speak but could not utter any sounds and was aware that she could not speak. CT scan of her head showed no acute intracranial abnormality. Her ability to speak improved markedly while being evaluated in the emergency room. MRI/MRA brain done revealing small/patchy left MCA infarct and no stenosis. 2D echo with EF 65-70% and grade 1 diastolic dysfunction. Neurology recommends ASA and Plavix for thrombotic infarct due to SVD. Swallow evaluation done and NPO recommended. Panda placed for nutritional support. She was noted to be in A Fib but developed bradycardia on IV Cardizem. Dr. Johney Frame following for input and recommended amiodarone, avoiding PPM due to advanced age and  coumadin for secondary stroke prevention. MTSB recommending discussion with family regarding pro/cons of anticoagulation. Blood pressures labile and HCTZ added today for better control. Therapies ongoing and team recommending CIR for progression. Speech intelligibility improving and working on dysphagia exercises--recommending hold off on PEG. Patient admitted to [facility] 09/05/13.  Patient has been participating in therapies with improvements noted in swallow function at the bedside.  Given previous results with impaired sensation, a repeat objective assessment is warranted.    Recommendation/Prognosis  Clinical Impression Dysphagia Diagnosis: Mild oral phase dysphagia;Mild-Moderate pharyngeal phase dysphagia Clinical impression: Patient exhibited functional improvement as compared to previous objective assessment.  Patient now demonstrates a mild oral dysphagia and mild-moderate pharyngeal dysphagia.  Impairments include: delay in oral transit; premature spillage to vallecular space; delayed swallow initiation; residue at the base of tongue, vallecular space, posterior pharyngeal wall, and lateral channels.  Initial trial resulted in trace aspiration; given this occurred only once, aspiration risk remains low.  Nectar-thick and thin liquid sips from cup resulted in occasional episodes of trace penetration with spontaneous throat clears.  Overall, patient presented with inconsistent sensation.  Penetration occurred with due to large volume of liquids and patient tilting her head back.  SLP cued patient to swallow twice per bite/sip which effectively reduced residue.    Recommend to proceed with diet of Dys.1 consistencies and thin liquids via cup with small portions, slow pace, and two swallows per bite/sip.  Given baseline cognitive deficits, patient  requires full supervision to recall and utilize safe swallow strategies.  SLP will observe toleration of upgrade during meal and further  diet upgrades will be evaluated with bedside trials.  Swallow Evaluation Recommendations Diet Recommendations: Dysphagia 1 (Puree);Thin liquid Liquid Administration via: Cup Medication Administration: Whole meds with puree Supervision: Patient able to self feed;Full supervision/cueing for compensatory strategies Compensations: Small sips/bites;Multiple dry swallows after each bite/sip Postural Changes and/or Swallow Maneuvers: Seated upright 90 degrees Oral Care Recommendations: Oral care QID Recommendations for Other Services: Rehab consult Follow up Recommendations: 24 hour supervision/assistance Prognosis Prognosis for Safe Diet Advancement: Good Barriers to Reach Goals: Cognitive deficits (dementia) Individuals Consulted Consulted and Agree with Results and Recommendations: Patient;Family member/caregiver Family Member Consulted: son  SLP Assessment/Plan   Short Term Goals: Week 1: SLP Short Term Goal 1 (Week 1): Patient will tolerate Dys.1 textures and thin liquids with Mod verbal cues to carryover 2 swallows per bite/sip with no overt s/s of aspiration. SLP Short Term Goal 2 (Week 1): Patient will perform oral motor and pharyngeal strengtheing exercises with Mod verbal cues. SLP Short Term Goal 3 (Week 1): Patient will increase speech intelligibilty at the phrase level with Mod verbal cues to increase vocal intensity and utilize over articulation.  SLP Short Term Goal 4 (Week 1): Patient will demonstrate efficient mastication of Dys.2 textures with carryover of recommended safe swallow strategies with Mod verbal cues.  General:  Date of Onset: 08/29/13 HPI: [name] is an 79 y.o. RH-female with a history of hypertension, dementia, stomach cancer and peptic ulcer disease, presenting with new onset of inability to speak. Patient reportedly was inattentive to those around her and attempted to speak but could not utter any sounds and was aware that she could not speak. CT scan of  her head showed no acute intracranial abnormality. Her ability to speak improved markedly while being evaluated in the emergency room. MRI/MRA brain done revealing small/patchy left MCA infarct and no stenosis. 2D echo with EF 65-70% and grade 1 diastolic dysfunction. Neurology recommends ASA and Plavix for thrombotic infarct due to SVD. Swallow evaluation done and NPO recommended. Panda placed for nutritional support. She was noted to be in A Fib but developed bradycardia on IV Cardizem. Dr. Johney Frame following for input and recommended amiodarone, avoiding PPM due to advanced age and coumadin for secondary stroke prevention. MTSB recommending discussion with family regarding pro/cons of anticoagulation. Blood pressures labile and HCTZ added today for better control. Therapies ongoing and team recommending CIR for progression. Speech intelligibility improving and working on dysphagia exercises--recommending hold off on PEG. Patient admitted to [facility] 09/05/13.   Type of Study: Modified Barium Swallowing Study Reason for Referral: Objectively evaluate swallowing function Previous Swallow Assessment: 9/22 MBS Diet Prior to this Study: NPO;Panda Temperature Spikes Noted: No Respiratory Status: Room air History of Recent Intubation: No Behavior/Cognition: Alert;Cooperative;Pleasant mood;Requires cueing Oral Cavity - Dentition: Adequate natural dentition Self-Feeding Abilities: Able to feed self;Needs assist;Needs set up Patient Positioning: Upright in chair Baseline Vocal Quality: Low vocal intensity Volitional Cough: Weak Volitional Swallow: Able to elicit  Oral Phase Oral Preparation/Oral Phase Oral Phase: Impaired Oral - Honey Oral - Honey Teaspoon: Not tested Oral - Nectar Oral - Nectar Teaspoon: Delayed oral transit;Lingual/palatal residue Oral - Nectar Cup: Lingual/palatal residue;Delayed oral transit Oral - Nectar Straw: Not tested Oral - Thin Oral - Thin Teaspoon:  Lingual/palatal residue;Delayed oral transit Oral - Thin Cup: Delayed oral transit;Lingual/palatal residue (premature spillage to vallecular space) Oral - Thin Straw:  Lingual/palatal residue;Delayed oral transit Oral - Solids Oral - Puree: Delayed oral transit;Lingual/palatal residue Oral - Mechanical Soft: Lingual/palatal residue;Delayed oral transit Oral Phase - Comment Oral Phase - Comment: Residue reduced with two swallows with all consistencies  Pharyngeal Phase  Pharyngeal Phase Pharyngeal Phase: Impaired Pharyngeal - Honey Pharyngeal - Honey Teaspoon: Not tested Pharyngeal - Nectar Pharyngeal - Nectar Teaspoon: Premature spillage to valleculae;Delayed swallow initiation;Pharyngeal residue - posterior pharnyx;Pharyngeal residue - valleculae;Lateral channel residue Penetration/Aspiration details (nectar teaspoon): Material does not enter airway Pharyngeal - Nectar Cup: Delayed swallow initiation;Premature spillage to valleculae;Lateral channel residue;Pharyngeal residue - valleculae;Pharyngeal residue - posterior pharnyx;Penetration/Aspiration during swallow;Trace aspiration Penetration/Aspiration details (nectar cup): Material enters airway, passes BELOW cords then ejected out (x1) Pharyngeal - Nectar Straw: Not tested Pharyngeal - Thin Pharyngeal - Thin Teaspoon: Premature spillage to valleculae;Pharyngeal residue - posterior pharnyx;Lateral channel residue;Pharyngeal residue - valleculae;Delayed swallow initiation Penetration/Aspiration details (thin teaspoon): Material does not enter airway Pharyngeal - Thin Cup: Premature spillage to valleculae;Delayed swallow initiation;Lateral channel residue;Pharyngeal residue - valleculae;Pharyngeal residue - posterior pharnyx;Penetration/Aspiration during swallow;Trace aspiration (x1) Penetration/Aspiration details (thin cup): Material enters airway, passes BELOW cords then ejected out Pharyngeal - Thin Straw: Premature spillage to  valleculae;Pharyngeal residue - posterior pharnyx;Lateral channel residue;Pharyngeal residue - valleculae;Delayed swallow initiation Pharyngeal - Solids Pharyngeal - Puree: Premature spillage to valleculae;Pharyngeal residue - valleculae;Pharyngeal residue - posterior pharnyx;Lateral channel residue;Delayed swallow initiation Penetration/Aspiration details (puree): Material does not enter airway Pharyngeal - Mechanical Soft: Premature spillage to valleculae;Pharyngeal residue - valleculae;Lateral channel residue;Delayed swallow initiation;Pharyngeal residue - posterior pharnyx Pharyngeal Phase - Comment Pharyngeal Comment: Residue was cleared with two swallows  Cervical Esophageal Phase  Cervical Esophageal Phase Cervical Esophageal Phase: [name], [name] 09/10/2013, 11:50 AM

## 2013-09-10 NOTE — Progress Notes (Signed)
NUTRITION FOLLOW UP  Intervention:   1.  Modify diet; per SLP/MD discretion. 2.  General healthful diet; encourage intake as needed. 3.  Enteral nutrition; TF held by PA.  Consider resume if pt unable to consume at least 50% of meals consistently.  Nutrition Dx:   Inadequate oral intake, ongoing  Monitor:   1.  Food/Beverage; pt meeting >/=90% estimated needs with tolerance. 2.  Wt/wt change; monitor trends 3.  Enteral nutrition; tolerance if resumed.  Assessment:   79 y.o. RH-female with a history of hypertension, dementia, stomach cancer and peptic ulcer disease, presenting with new onset of inability to speak. MRI/MRA brain done revealing small/patchy left MCA infarct and no stenosis. Swallow evaluation done and NPO recommended. Panda tube was placed for nutrition support.  RD met with pt who reports anticipation of lunch meal today.  Per chart review, PA has held TFs, but is continuing flushes at this time due to renal status.   RD to follow.   Height: Ht Readings from Last 1 Encounters:  08/30/13 5\' 5"  (1.651 m)    Weight Status:   Wt Readings from Last 1 Encounters:  09/10/13 98 lb 12.3 oz (44.8 kg)    Re-estimated needs:  Kcal: 1330-1520 Protein: 58-66g Fluid: ~1.5 L/day  Skin: pressure ulcer to coccyx  Diet Order: Dysphagia 1, thin   Intake/Output Summary (Last 24 hours) at 09/10/13 1322 Last data filed at 09/10/13 1045  Gross per 24 hour  Intake      0 ml  Output      0 ml  Net      0 ml    Last BM: 9/29   Labs:   Recent Labs Lab 09/04/13 1130 09/06/13 0500 09/10/13 0808  NA 142 140 134*  K 3.2* 4.0 5.0  CL 110 107 97  CO2 20 24 28   BUN 18 24* 40*  CREATININE 1.27* 1.36* 1.61*  CALCIUM 8.3* 7.8* 8.6  GLUCOSE 183* 144* 143*    CBG (last 3)   Recent Labs  09/10/13 0008 09/10/13 0709 09/10/13 1153  GLUCAP 147* 140* 115*    Scheduled Meds: . [START ON 09/11/2013] amiodarone  200 mg Oral Daily  . antiseptic oral rinse  15 mL Mouth  Rinse q12n4p  . aspirin EC  81 mg Oral Daily  . atorvastatin  40 mg Oral q1800  . calcitRIOL  0.25 mcg Per Tube Daily  . chlorhexidine  15 mL Mouth Rinse BID  . coumadin book   Does not apply Once  . enoxaparin (LOVENOX) injection  30 mg Subcutaneous Q24H  . free water  200 mL Per Tube 5 X Daily  . pantoprazole sodium  40 mg Per Tube Daily  . sodium bicarbonate  650 mg Per Tube QID  . warfarin  6 mg Oral ONCE-1800  . Warfarin - Pharmacist Dosing Inpatient   Does not apply q1800    Continuous Infusions:   Loyce Dys, MS RD LDN Clinical Inpatient Dietitian Pager: [phone] Weekend/After hours pager: [phone]

## 2013-09-10 NOTE — Progress Notes (Signed)
ANTICOAGULATION CONSULT NOTE - Follow Up Consult  Pharmacy Consult for coumadin Indication: new onset afib and CVA  No Known Allergies  Patient Measurements: Weight: 98 lb 12.3 oz (44.8 kg) Heparin Dosing Weight:   Vital Signs: Temp: 98.1 F (36.7 C) (09/30 0544) Temp src: Oral (09/30 0544) BP: 110/60 mmHg (09/30 0815) Pulse Rate: 51 (09/30 0815)  Labs:  Recent Labs  09/08/13 0520 09/09/13 0500 09/10/13 0808  HGB  --   --  9.2*  HCT  --   --  28.6*  PLT  --   --  209  LABPROT 12.5 12.8 12.6  INR 0.95 0.98 0.96    The CrCl is unknown because both a height and weight (above a minimum accepted value) are required for this calculation.   Medications:  Scheduled:  . amiodarone  100 mg Oral BID  . antiseptic oral rinse  15 mL Mouth Rinse q12n4p  . aspirin EC  81 mg Oral Daily  . atorvastatin  40 mg Oral q1800  . calcitRIOL  0.25 mcg Per Tube Daily  . chlorhexidine  15 mL Mouth Rinse BID  . coumadin book   Does not apply Once  . enoxaparin (LOVENOX) injection  30 mg Subcutaneous Q24H  . free water  100 mL Per Tube 5 X Daily  . furosemide  20 mg Per Tube Daily  . pantoprazole sodium  40 mg Per Tube Daily  . sodium bicarbonate  650 mg Per Tube QID  . warfarin  6 mg Oral ONCE-1800  . Warfarin - Pharmacist Dosing Inpatient   Does not apply q1800   Infusions:  . feeding supplement (JEVITY 1.2 CAL) 1,000 mL (09/09/13 1748)    Assessment: 79 yo female with new onset afib and CVA is currently on subtherapeutic coumadin.  INR didn't move much; now at 0.96.  Patient is also on lovenox 30mg  sq q24h Goal of Therapy:  INR 2-3 Monitor platelets by anticoagulation protocol: Yes   Plan:  -Warfarin 6 mg x 1 today (since INR didn't move after 4mg  dose for the past previous 2 days) -Daily PT/INR -Monitor for bleeding -DC SQ lovenox 30mg  q24h after INR is >2   Uldine Fuster, Tsz-Yin 09/10/2013,10:09 AM

## 2013-09-10 NOTE — Progress Notes (Signed)
Occupational Therapy Session Note  Patient Details  Name: [name] MRN: [id number] Date of Birth: Oct 16, 1926  Today's Date: 09/10/2013 Time: 1045-1130 and 4098-1191 Time Calculation (min): 45 min and 52 min  Short Term Goals: Week 1:  OT Short Term Goal 1 (Week 1): Pt will complete stand pivot transfer with min assist OT Short Term Goal 2 (Week 1): Pt will complete UB dressing with supervision OT Short Term Goal 3 (Week 1): Pt will complete bathing with min assist at sit <> stand level OT Short Term Goal 4 (Week 1): Pt will complete LB dressing with mod assist  Skilled Therapeutic Interventions/Progress Updates:    1) Pt seen for ADL retraining with focus on transfers, sit > stand, and decreasing assistance with self-care tasks of bathing and dressing. Pt completed bathing at sink with setup assist and cues to ensure thoroughness. Pt incontinent of urine and when standing to wash perineal area pt began urinating again. Pt able to recognize, stop, and transfer to toilet with min assist where she completed urinating. Min assist stand pivot transfer to/from toilet. Pt close supervision with sit > stand for hygiene and LB dressing, she continues to stand in flexed position despite cues and is very reliant on UE for balance in standing. Pt willing to sit in w/c at end of session, all necessary items in reach.  2) Pt seen for 1:1 OT with focus on functional mobility with RW, upright standing balance, and functional use of RUE.  Pt in w/c finishing lunch with nurse tech and son present.  Pt with coughing spell upon arrival.  Educated pt and son on coughing is second line of defense to clear airway and reiterated the importance of swallowing 2x with each bite and sip.  Ambulated to therapy gym >150 feet with SBA.  Engaged in pipe tree activity with focus on multi-step activity with use of RUE.  Pt with decreased ability to follow visual picture to recreate 3D puzzle.  Pt required mod cues for problem  solving.  Pt able to utilize RUE demonstrated shoulder flexion ~90 degrees.  Performed activity in standing with focus on trunk extension and upright standing balance.  Pt ambulated back to room and requested to lie down in bed.  Pt performed transfer back to bed with close supervision.  Therapy Documentation Precautions:  Precautions Precautions: Fall Precaution Comments: Panda in R nare, memory issues (hx of dementia), pt with both urinary and bowel incontinence. Restrictions Weight Bearing Restrictions: No General: General Amount of Missed OT Time (min): 15 Minutes Vital Signs: Therapy Vitals Temp: 97.7 F (36.5 C) Temp src: Oral Pulse Rate: 51 Resp: 16 BP: 110/60 mmHg Patient Position, if appropriate: Lying Oxygen Therapy SpO2: 94 % O2 Device: None (Room air) Pain: Pain Assessment Pain Assessment: No/denies pain  See FIM for current functional status  Therapy/Group: Individual Therapy  Pamelyn Bancroft 09/10/2013, 12:12 PM

## 2013-09-10 NOTE — Progress Notes (Signed)
Lasix discontinued due to worsening of renal status. Will monitor closely for fluid overload. MBS done and patient started on D1, thin liquids. Will stop TF and increase water flushes to help with renal status. Discussed cardiac medications with Dr. Corrin Parker amiodarone to 200 mg/day and recommended coumadin alone for stroke prevention. Will discontinue ASA once INR therapeutic.

## 2013-09-10 NOTE — IPOC Note (Signed)
Overall Plan of Care [facility]) Patient Details Name: ELIVIA ROBOTHAM MRN: [id number] DOB: [birth year]  Admitting Diagnosis: LT CVA  Hospital Problems: Active Problems:   Dementia   Hypertension   Atrial fibrillation   Stroke, acute, embolic   Dysphagia, unspecified(787.20)   Dysarthria     Functional Problem List: Nursing Bladder;Bowel;Endurance;Pain;Safety;Skin Integrity;Nutrition  PT Balance;Motor;Endurance;Safety  OT Balance;Cognition;Motor  SLP Cognition;Linguistic;Nutrition  TR         Basic ADL's: OT Grooming;Bathing;Dressing;Toileting     Advanced  ADL's: OT       Transfers: PT Bed Mobility;Bed to Chair;Car;Furniture  OT Toilet;Tub/Shower     Locomotion: PT Ambulation;Wheelchair Mobility;Stairs (stairs for community use)     Additional Impairments: OT Fuctional Use of Upper Extremity  SLP Swallowing;Communication;Social Cognition expression Memory;Awareness;Problem Solving  TR      Anticipated Outcomes Item Anticipated Outcome  Self Feeding    Swallowing  Min assist    Basic self-care  min assist  Toileting  min assist   Bathroom Transfers min assist  Bowel/Bladder  continent with mod assist  Transfers  supervision  Locomotion  supervision to min assist (son was assisting with amb PTA)  Communication  Supervision   Cognition  Supervision  Pain  pain <3 on scale of 0-10  Safety/Judgment  safe with mod assist   Therapy Plan: PT Intensity: Minimum of 1-2 x/day ,45 to 90 minutes PT Frequency: 5 out of 7 days PT Duration Estimated Length of Stay: 2 weeks OT Intensity: Minimum of 1-2 x/day, 45 to 90 minutes OT Frequency: 5 out of 7 days OT Duration/Estimated Length of Stay: 2 weeks SLP Intensity: Minumum of 1-2 x/day, 30 to 90 minutes SLP Frequency: 5 out of 7 days SLP Duration/Estimated Length of Stay: 2 weeks       Team Interventions: Nursing Interventions Patient/Family Education;Bladder Management;Bowel Management;Pain  Management;Medication Management;Skin Care/Wound Management;Cognitive Remediation/Compensation;Dysphagia/Aspiration Precaution Training;Discharge Planning  PT interventions Ambulation/gait training;Balance/vestibular training;Cognitive remediation/compensation;Discharge planning;DME/adaptive equipment instruction;Functional mobility training;Neuromuscular re-education;Patient/family education;Psychosocial support;Stair training;Therapeutic Activities;Therapeutic Exercise;UE/LE Strength taining/ROM;UE/LE Coordination activities;Wheelchair propulsion/positioning  OT Interventions Balance/vestibular training;Cognitive remediation/compensation;Discharge planning;DME/adaptive equipment instruction;Functional mobility training;Neuromuscular re-education;Pain management;Patient/family education;Psychosocial support;Self Care/advanced ADL retraining;Skin care/wound managment;Therapeutic Activities;Therapeutic Exercise;UE/LE Strength taining/ROM;UE/LE Coordination activities  SLP Interventions Cueing hierarchy;Dysphagia/aspiration precaution training;Environmental controls;Functional tasks;Internal/external aids;Oral motor exercises;Patient/family education;Speech/Language facilitation;Other (comment) (pharyngeal strengthening exercises)  TR Interventions    SW/CM Interventions Discharge Planning;Psychosocial Support;Patient/Family Education    Team Discharge Planning: Destination: PT-Home ,OT- Home , SLP-Home Projected Follow-up: PT-Home health PT;24 hour supervision/assistance, OT-  Home health OT, SLP-Home Health SLP;24 hour supervision/assistance Projected Equipment Needs: PT- , OT- None recommended by OT, SLP-None recommended by SLP Patient/family involved in discharge planning: PT- Patient,  OT-Patient;Family member/caregiver, SLP-Family member/caregiver  MD ELOS: 2wk Medical Rehab Prognosis:  Fair Assessment: 79 y.o. RH-female with a history of hypertension, dementia, stomach cancer and peptic ulcer  disease, presenting with new onset of inability to speak. Patient reportedly was inattentive to those around her and attempted to speak but could not utter any sounds and was aware that she could not speak. CT scan of her head showed no acute intracranial abnormality. Her ability to speak improved markedly while being evaluated in the emergency room. MRI/MRA brain done revealing small/patchy left MCA infarct and no stenosis. 2D echo with EF 65-70% and grade 1 diastolic dysfunction. Neurology recommends ASA and Plavix for thrombotic infarct due to SVD. Swallow evaluation done and NPO recommended. Panda placed yesterday for nutritional support.   Now requiring 24/7 Rehab RN,MD, as well as CIR level PT, OT and  SLP.  Treatment team will focus on ADLs, swallowing and mobility with goals set at Sup level     See Team Conference Notes for weekly updates to the plan of care

## 2013-09-10 NOTE — Progress Notes (Signed)
Physical Therapy Session Note  Patient Details  Name: [name] MRN: [id number] Date of Birth: September 22, 1926  Today's Date: 09/10/2013 Time: 7829-5621 Time Calculation (min): 60 min  Short Term Goals: Week 1:  PT Short Term Goal 1 (Week 1): STG's=LTG's due to short ELOS  Skilled Therapeutic Interventions/Progress Updates:   Pt resting in bed; performed supine > sit supervision and performed gait in controlled environment with RW x 150' x 4 reps with supervision-min A with intermittent verbal and tactile cues to maintain upright posture and safe distance to RW and verbal cues for navigation around unit.  Continued gait training with stair negotiation training up/down 5 stairs x 2 reps with bilat UE support on rails for LE strengthening, coordination, weight shifting and increased community access (pt reports that she and her son would go out to eat pizza frequently during the week; community ambulation goal added).  Also performed gait with RW on ramp x 2 reps for home entry/exit with min A and intermittent verbal and tactile cues to maintain upright posture and safe distance to RW.  Discussed with patient use of SPC for household mobility instead of HHA or furniture walking; pt adamantly refused and said she does not want to use one!  Performed household gait and obstacle negotiation training; pt cued to hold therapist's hand to simulate holding son's hand but pt began reaching for RW.  Pt asked why she shouldn't use RW, replied to pt that she stated it was too narrow to use RW in house.  Pt reports she thinks she will need it now.  Continued household training with RW with stepping over low obstacles, over compliant surface, weaving L and R around narrow cones, and lateral stepping between narrow obstacles all with min A.    Performed simulated low car transfer training with RW with supervision with pt sitting first and then placing LE into car.  Able to bring LE out of car with supervision.  Returned  to gym and performed flat bed mobility training to simulate home on L side of bed.  Pt able to transition supine <> sit on flat bed with supervision.  Returned to room and bed to allow RN to remove NG tube.  Therapy Documentation Precautions:  Precautions Precautions: Fall Precaution Comments: Panda in R nare, memory issues (hx of dementia), pt with both urinary and bowel incontinence. Restrictions Weight Bearing Restrictions: No Vital Signs: Therapy Vitals Temp: 97.8 F (36.6 C) Temp src: Oral Pulse Rate: 48 Resp: 17 BP: 157/70 mmHg Patient Position, if appropriate: Lying Oxygen Therapy SpO2: 97 % O2 Device: None (Room air) Pain:  No c/o pain Locomotion : Ambulation Ambulation/Gait Assistance: 4: Min assist   See FIM for current functional status  Therapy/Group: Individual Therapy  [name] [facility] 09/10/2013, 4:34 PM

## 2013-09-11 ENCOUNTER — Inpatient Hospital Stay (HOSPITAL_COMMUNITY): Payer: Medicare Other | Admitting: Speech Pathology

## 2013-09-11 ENCOUNTER — Inpatient Hospital Stay (HOSPITAL_COMMUNITY): Payer: Medicare Other | Admitting: Physical Therapy

## 2013-09-11 ENCOUNTER — Inpatient Hospital Stay (HOSPITAL_COMMUNITY): Payer: Medicare Other | Admitting: Occupational Therapy

## 2013-09-11 ENCOUNTER — Inpatient Hospital Stay (HOSPITAL_COMMUNITY): Payer: Medicare Other

## 2013-09-11 LAB — PROTIME-INR: Prothrombin Time: 13.4 seconds (ref 11.6–15.2)

## 2013-09-11 MED ORDER — WARFARIN SODIUM 6 MG PO TABS
6.0000 mg | ORAL_TABLET | Freq: Once | ORAL | Status: AC
  Administered 2013-09-11: 6 mg via ORAL
  Filled 2013-09-11: qty 1

## 2013-09-11 MED ORDER — SODIUM CHLORIDE 0.45 % IV SOLN
INTRAVENOUS | Status: DC
  Administered 2013-09-11 – 2013-09-12 (×2): via INTRAVENOUS

## 2013-09-11 MED ORDER — MEGESTROL ACETATE 400 MG/10ML PO SUSP
400.0000 mg | Freq: Two times a day (BID) | ORAL | Status: DC
  Administered 2013-09-11 – 2013-09-17 (×12): 400 mg via ORAL
  Filled 2013-09-11 (×15): qty 10

## 2013-09-11 NOTE — Progress Notes (Signed)
Physical Therapy Note  Patient Details  Name: [name] MRN: [id number] Date of Birth: Nov 13, 1926 Today's Date: 09/11/2013 4098-1191 30 Min, individual therapy No pain reported  Treatment focused on neuromuscular re-education, balance retraining via forced use, tactile, manual and VCS for : Falls Prevention I exs, standing on compliant Airex mat, trunk rotation/ shortening/lengthening in unsupported sitting while reaching within BOS L or R.  Pt is resistant to wt shifting to R in standing.  Pt has poor balance strategies with LOB posteriorly.  Gait without AD x 75' with min assist for wt shifting, mod VCs for upright posture, forward gaze.  Pt transferred to recliner with min assist.  She requested sip of water: using swallowing strategies, pt still had problems with clearing after swallowing small sip x 2.  She coughed repeatedly with VCs and eventually cleared.  Son was with pt and agreed to let nsg know if he left room so that safety belt could be applied.  RN informed.  Kosisochukwu Goldberg 09/11/2013, 3:22 PM

## 2013-09-11 NOTE — Progress Notes (Signed)
Speech Language Pathology Daily Session Note  Patient Details  Name: [name] MRN: [id number] Date of Birth: [birth year]  Today's Date: 09/10/2013 Time: 1205-1215  Short Term Goals: Week 1: SLP Short Term Goal 1 (Week 1): Patient will tolerate puree diet with Mod clinician cues to perfrom 2 hard effortful swallows per bolus.  SLP Short Term Goal 2 (Week 1): Patient will perform oral motor and pharyngeal strengtheing exercises with Mod verbal cues. SLP Short Term Goal 3 (Week 1): Patient will increase speech intelligibilty at the phrase level with Mod verbal cues to increase vocal intensity and utilize over articulation.   Skilled Therapeutic Interventions: Session focused on addressing patient and family education with son regarding MBSS results.  SLP recommending initiation of a Dys.1 texture diet with thin liquids; aspiration precautions include small portions, slow pace and 2 swallows per bite/sip given ligual and pharyngeal residue observed during study.  Patient and son verbalized understanding of information; son is able to provide full supervision with meals.    FIM:  Comprehension Comprehension Mode: Auditory Comprehension: 5-Follows basic conversation/direction: With no assist Expression Expression Mode: Verbal Expression: 5-Expresses basic needs/ideas: With extra time/assistive device Social Interaction Social Interaction: 4-Interacts appropriately 75 - 89% of the time - Needs redirection for appropriate language or to initiate interaction. Problem Solving Problem Solving: 3-Solves basic 50 - 74% of the time/requires cueing 25 - 49% of the time Memory Memory: 3-Recognizes or recalls 50 - 74% of the time/requires cueing 25 - 49% of the time  Pain Pain Assessment Pain Assessment: No/denies pain  Therapy/Group: Individual Therapy  Charlane Ferretti., CCC-SLP [phone]  Alixandria Friedt 09/11/2013, 7:50 AM

## 2013-09-11 NOTE — Progress Notes (Signed)
ANTICOAGULATION CONSULT NOTE - Follow Up Consult  Pharmacy Consult for coumadin Indication: afib and new CVA  No Known Allergies  Patient Measurements: Weight: 96 lb 14.4 oz (43.954 kg) Heparin Dosing Weight:   Vital Signs: Temp: 96.6 F (35.9 C) (10/01 0544) Temp src: Axillary (10/01 0544) BP: 134/54 mmHg (10/01 0544) Pulse Rate: 51 (10/01 0544)  Labs:  Recent Labs  09/09/13 0500 09/10/13 0808 09/11/13 0620  HGB  --  9.2*  --   HCT  --  28.6*  --   PLT  --  209  --   LABPROT 12.8 12.6 13.4  INR 0.98 0.96 1.04  CREATININE  --  1.61*  --     The CrCl is unknown because both a height and weight (above a minimum accepted value) are required for this calculation.   Medications:  Scheduled:  . amiodarone  200 mg Oral Daily  . antiseptic oral rinse  15 mL Mouth Rinse q12n4p  . aspirin EC  81 mg Oral Daily  . atorvastatin  40 mg Oral q1800  . calcitRIOL  0.25 mcg Per Tube Daily  . chlorhexidine  15 mL Mouth Rinse BID  . coumadin book   Does not apply Once  . enoxaparin (LOVENOX) injection  30 mg Subcutaneous Q24H  . pantoprazole sodium  40 mg Per Tube Daily  . sodium bicarbonate  650 mg Per Tube QID  . Warfarin - Pharmacist Dosing Inpatient   Does not apply q1800   Infusions:    Assessment: 79 yo female with afib and new CVA is currently on subtherapeutic coumadin. Her INR today is 1.04.   Goal of Therapy:  INR 2-3 Monitor platelets by anticoagulation protocol: Yes   Plan:  1) Repeat coumadin 6mg  po x1 2) INR in am  Rockford Leinen, Tsz-Yin 09/11/2013,8:29 AM

## 2013-09-11 NOTE — Progress Notes (Signed)
Social Work Lucy Chris, LCSW Social Worker Signed  Patient Care Conference Service date: 09/11/2013 2:34 PM  Inpatient RehabilitationTeam Conference and Plan of Care Update Date: 09/11/2013   Time: 11:50 Am     Patient Name: [name]       Medical Record Number: [id number]   Date of Birth: [birth year] Sex: Female         Room/Bed: 4W09C/4W09C-01 Payor Info: Payor: MEDICARE / Plan: MEDICARE PART A AND B / Product Type: *No Product type* /   Admitting Diagnosis: LT CVA   Admit Date/Time:  09/05/2013  4:37 PM Admission Comments: No comment available   Primary Diagnosis:  <principal problem not specified> Principal Problem: <principal problem not specified>    Patient Active Problem List     Diagnosis  Date Noted   .  Dysphagia, unspecified(787.20)  09/02/2013   .  Dysarthria  09/02/2013   .  Palliative care encounter  09/02/2013   .  Weakness generalized  09/02/2013   .  Atrial fibrillation  09/01/2013   .  Stroke, acute, embolic  09/01/2013   .  Hypertension  08/29/2013   .  Dementia  03/25/2012   .  Lung nodule  05/24/2011     Expected Discharge Date: Expected Discharge Date: 09/17/13  Team Members Present: Physician leading conference: Dr. Claudette Laws Social Worker Present: Staci Acosta, LCSW;Becky Iris Tatsch, LCSW Nurse Present: Other (comment) Alphonzo Lemmings Reardon-RN) PT Present: [name], Clearnce Hasten, PT OT Present: Rosalio Loud, OT;Kris Shireen Quan, OT SLP Present: Fae Pippin, Charlie Pitter, SLP PPS Coordinator present : Edson Snowball, Chapman Fitch, RN, CRRN        Current Status/Progress  Goal  Weekly Team Focus   Medical     upgraded diet to D1 thin  100% po intake with fluid and meals  see above   Bowel/Bladder     Incontinent of bladder. LBM 9/29, 2014  To be continent of bowel and bladder  Monitor any s/s of diarrhea and constipation.   Swallow/Nutrition/ Hydration     Dys.1 textures and thin liquids with multiple  swallows and full supervision   least restrictive p.o. intake   carryover of safe swallow strategies, trials of Dys.2 textures   ADL's     mod assist bathing, LB dressing, and toileting, min assist transfers  supervision overall, min assist toilet and walk-in shower transfer  activity tolerance, balance with focus on LB ADLs   Mobility     supervision-min A   Supervision overall except min A community ambulation   Higher level balance, gait, activity tolerance   Communication     Min assist   Supervision   increase vocal intensity   Safety/Cognition/ Behavioral Observations    at baseline       Pain     n/a  n/a  Monitor any onset of pain.   Skin     Unstagable to sacrum area with allevyn dressing.  No new skin breakdown/infection  Monitor skin q shift.     *See Care Plan and progress notes for long and short-term goals.    Barriers to Discharge:  needs 24/7 sup      Possible Resolutions to Barriers:    train caregiver      Discharge Planning/Teaching Needs:    HOme with son who can provide care, very committed to Mom      Team Discussion:    Passed MBS-Dys 1 thin liquids-speech to monitor to advance diet.  IV in PM  due to dehydrated.  Son here for family education will provide care at home.   Revisions to Treatment Plan:    None    Continued Need for Acute Rehabilitation Level of Care: The patient requires daily medical management by a physician with specialized training in physical medicine and rehabilitation for the following conditions: Daily direction of a multidisciplinary physical rehabilitation program to ensure safe treatment while eliciting the highest outcome that is of practical value to the patient.: Yes Daily medical management of patient stability for increased activity during participation in an intensive rehabilitation regime.: Yes Daily analysis of laboratory values and/or radiology reports with any subsequent need for medication adjustment of medical  intervention for : Neurological problems  Lucy Chris 09/11/2013, 2:34 PM          Patient ID: [name], female   DOB: 1926-06-10, 79 y.o.   MRN: [id number]

## 2013-09-11 NOTE — Plan of Care (Signed)
Problem: RH BLADDER ELIMINATION Goal: RH STG MANAGE BLADDER WITH ASSISTANCE STG Manage Bladder With maxx Assistance  Outcome: Not Progressing Still having episodes of incontinence

## 2013-09-11 NOTE — Progress Notes (Signed)
Subjective/Complaints: NG tube out DI thin liq diet Oriented to Hospital, self, stroke  Review of Systems - Not accurate secondary to dementia and aphasia  Objective: Vital Signs: Blood pressure 134/54, pulse 51, temperature 96.6 F (35.9 C), temperature source Axillary, resp. rate 16, weight 43.954 kg (96 lb 14.4 oz), SpO2 94.00%. No results found. Results for orders placed during the hospital encounter of 09/05/13 (from the past 72 hour(s))  GLUCOSE, CAPILLARY     Status: Abnormal   Collection Time    09/08/13 12:00 PM      Result Value Range   Glucose-Capillary 125 (*) 70 - 99 mg/dL   Comment 1 Notify RN    GLUCOSE, CAPILLARY     Status: Abnormal   Collection Time    09/08/13  6:05 PM      Result Value Range   Glucose-Capillary 125 (*) 70 - 99 mg/dL   Comment 1 Notify RN    GLUCOSE, CAPILLARY     Status: Abnormal   Collection Time    09/09/13 12:00 AM      Result Value Range   Glucose-Capillary 121 (*) 70 - 99 mg/dL   Comment 1 Notify RN    PROTIME-INR     Status: None   Collection Time    09/09/13  5:00 AM      Result Value Range   Prothrombin Time 12.8  11.6 - 15.2 seconds   INR 0.98  0.00 - 1.49  GLUCOSE, CAPILLARY     Status: Abnormal   Collection Time    09/09/13  6:02 AM      Result Value Range   Glucose-Capillary 163 (*) 70 - 99 mg/dL   Comment 1 Notify RN    URINALYSIS, ROUTINE W REFLEX MICROSCOPIC     Status: None   Collection Time    09/09/13 11:57 AM      Result Value Range   Color, Urine YELLOW  YELLOW   APPearance CLEAR  CLEAR   Specific Gravity, Urine 1.010  1.005 - 1.030   pH 8.0  5.0 - 8.0   Glucose, UA NEGATIVE  NEGATIVE mg/dL   Hgb urine dipstick NEGATIVE  NEGATIVE   Bilirubin Urine NEGATIVE  NEGATIVE   Ketones, ur NEGATIVE  NEGATIVE mg/dL   Protein, ur NEGATIVE  NEGATIVE mg/dL   Urobilinogen, UA 0.2  0.0 - 1.0 mg/dL   Nitrite NEGATIVE  NEGATIVE   Leukocytes, UA NEGATIVE  NEGATIVE   Comment: MICROSCOPIC NOT DONE ON URINES WITH NEGATIVE  PROTEIN, BLOOD, LEUKOCYTES, NITRITE, OR GLUCOSE <1000 mg/dL.  URINE CULTURE     Status: None   Collection Time    09/09/13 11:57 AM      Result Value Range   Specimen Description URINE, CATHETERIZED     Special Requests NONE     Culture  Setup Time       Value: 09/09/2013 15:04     Performed at [facility] Count       Value: NO GROWTH     Performed at [facility]   Culture       Value: NO GROWTH     Performed at [facility]   Report Status 09/10/2013 FINAL    GLUCOSE, CAPILLARY     Status: Abnormal   Collection Time    09/09/13  1:09 PM      Result Value Range   Glucose-Capillary 153 (*) 70 - 99 mg/dL  GLUCOSE, CAPILLARY     Status: Abnormal  Collection Time    09/09/13  5:22 PM      Result Value Range   Glucose-Capillary 145 (*) 70 - 99 mg/dL   Comment 1 Notify RN    GLUCOSE, CAPILLARY     Status: Abnormal   Collection Time    09/09/13  7:18 PM      Result Value Range   Glucose-Capillary 152 (*) 70 - 99 mg/dL   Comment 1 Notify RN    GLUCOSE, CAPILLARY     Status: Abnormal   Collection Time    09/10/13 12:08 AM      Result Value Range   Glucose-Capillary 147 (*) 70 - 99 mg/dL  GLUCOSE, CAPILLARY     Status: Abnormal   Collection Time    09/10/13  7:09 AM      Result Value Range   Glucose-Capillary 140 (*) 70 - 99 mg/dL   Comment 1 Notify RN    PROTIME-INR     Status: None   Collection Time    09/10/13  8:08 AM      Result Value Range   Prothrombin Time 12.6  11.6 - 15.2 seconds   INR 0.96  0.00 - 1.49  CBC     Status: Abnormal   Collection Time    09/10/13  8:08 AM      Result Value Range   WBC 7.3  4.0 - 10.5 K/uL   RBC 2.96 (*) 3.87 - 5.11 MIL/uL   Hemoglobin 9.2 (*) 12.0 - 15.0 g/dL   HCT 16.1 (*) 09.6 - 04.5 %   MCV 96.6  78.0 - 100.0 fL   MCH 31.1  26.0 - 34.0 pg   MCHC 32.2  30.0 - 36.0 g/dL   RDW 40.9  81.1 - 91.4 %   Platelets 209  150 - 400 K/uL  BASIC METABOLIC PANEL     Status: Abnormal   Collection Time     09/10/13  8:08 AM      Result Value Range   Sodium 134 (*) 135 - 145 mEq/L   Potassium 5.0  3.5 - 5.1 mEq/L   Chloride 97  96 - 112 mEq/L   CO2 28  19 - 32 mEq/L   Glucose, Bld 143 (*) 70 - 99 mg/dL   BUN 40 (*) 6 - 23 mg/dL   Creatinine, Ser 7.82 (*) 0.50 - 1.10 mg/dL   Calcium 8.6  8.4 - 95.6 mg/dL   GFR calc non Af Amer 28 (*) >90 mL/min   GFR calc Af Amer 33 (*) >90 mL/min   Comment: (NOTE)     The eGFR has been calculated using the CKD EPI equation.     This calculation has not been validated in all clinical situations.     eGFR's persistently <90 mL/min signify possible Chronic Kidney     Disease.  GLUCOSE, CAPILLARY     Status: Abnormal   Collection Time    09/10/13 11:53 AM      Result Value Range   Glucose-Capillary 115 (*) 70 - 99 mg/dL   Comment 1 Notify RN    GLUCOSE, CAPILLARY     Status: Abnormal   Collection Time    09/10/13  6:46 PM      Result Value Range   Glucose-Capillary 161 (*) 70 - 99 mg/dL   Comment 1 Notify RN    PROTIME-INR     Status: None   Collection Time    09/11/13  6:20 AM  Result Value Range   Prothrombin Time 13.4  11.6 - 15.2 seconds   INR 1.04  0.00 - 1.49      Nursing note and vitals reviewed.  Constitutional: She is oriented to person, place, and time. She appears cachectic.  Elderly female  HENT:  Head: Normocephalic and atraumatic.  Panda in right nares  Eyes: Conjunctivae are normal. Pupils are equal, round, and reactive to light.  Neck: Normal range of motion. Neck supple.  Cardiovascular: Normal rate and regular rhythm.  Pulmonary/Chest: Effort normal and breath sounds normal. No respiratory distress.  Abdominal: Soft. Bowel sounds are normal. She exhibits no distension. There is no tenderness.  Musculoskeletal: She exhibits no edema. Right > Left RTC shoulder contracture with poor active abduction, Severe R hallux valgus Neurological: She is alert and oriented to person, place,not time Speech soft, slow, hesitant  with moderately dysarthria. Able to follow simple commands. RUE weakness proximally.  Skin: Skin is warm and dry.  Neuro:  Eyes without evidence of nystagmus  Tone is normal without evidence of spasticity   No evidence of trunkal ataxia  Motor strength is in 3-/5 R deltoid, 5/5 Left deltoid deltoid, 5/5 biceps, triceps, finger flexors and extensors, wrist flexors and extensors, hip flexors, knee flexors and extensors, ankle dorsiflexors, plantar flexors, toe flexors and extensors      Assessment/Plan: 1. Functional deficits secondary to Left embolic MCA infarct which require 3+ hours per day of interdisciplinary therapy in a comprehensive inpatient rehab setting. Physiatrist is providing close team supervision and 24 hour management of active medical problems listed below. Physiatrist and rehab team continue to assess barriers to discharge/monitor patient progress toward functional and medical goals. Team conference today please see physician documentation under team conference tab, met with team face-to-face to discuss problems,progress, and goals. Formulized individual treatment plan based on medical history, underlying problem and comorbidities. FIM: FIM - Bathing Bathing Steps Patient Completed: Chest;Right Arm;Abdomen;Front perineal area;Right upper leg;Left upper leg;Left Arm Bathing: 3: Mod-Patient completes 5-7 31f 10 parts or 50-74%  FIM - Upper Body Dressing/Undressing Upper body dressing/undressing steps patient completed: Thread/unthread right sleeve of pullover shirt/dresss;Thread/unthread left sleeve of pullover shirt/dress;Put head through opening of pull over shirt/dress;Pull shirt over trunk Upper body dressing/undressing: 5: Set-up assist to: Obtain clothing/put away FIM - Lower Body Dressing/Undressing Lower body dressing/undressing steps patient completed: Thread/unthread right underwear leg;Thread/unthread left underwear leg;Thread/unthread right pants  leg;Thread/unthread left pants leg;Pull pants up/down Lower body dressing/undressing: 3: Mod-Patient completed 50-74% of tasks  FIM - Toileting Toileting steps completed by patient: Performs perineal hygiene;Adjust clothing after toileting Toileting: 3: Mod-Patient completed 2 of 3 steps  FIM - Diplomatic Services operational officer Devices: Grab bars Toilet Transfers: 4-To toilet/BSC: Min A (steadying Pt. > 75%);4-From toilet/BSC: Min A (steadying Pt. > 75%)  FIM - Bed/Chair Transfer Bed/Chair Transfer Assistive Devices: Therapist, occupational: 5: Supine > Sit: Supervision (verbal cues/safety issues);5: Sit > Supine: Supervision (verbal cues/safety issues);5: Bed > Chair or W/C: Supervision (verbal cues/safety issues);5: Chair or W/C > Bed: Supervision (verbal cues/safety issues)  FIM - Locomotion: Wheelchair Distance: 100 Locomotion: Wheelchair: 0: Activity did not occur FIM - Locomotion: Ambulation Locomotion: Ambulation Assistive Devices: Designer, industrial/product Ambulation/Gait Assistance: 4: Min assist Locomotion: Ambulation: 4: Travels 150 ft or more with minimal assistance (Pt.>75%)  Comprehension Comprehension Mode: Auditory Comprehension: 5-Follows basic conversation/direction: With no assist  Expression Expression Mode: Verbal Expression: 5-Expresses basic needs/ideas: With extra time/assistive device  Social Interaction Social Interaction: 4-Interacts appropriately 75 - 89%  of the time - Needs redirection for appropriate language or to initiate interaction.  Problem Solving Problem Solving: 3-Solves basic 50 - 74% of the time/requires cueing 25 - 49% of the time  Memory Memory: 3-Recognizes or recalls 50 - 74% of the time/requires cueing 25 - 49% of the time  Medical Problem List and Plan:  1. DVT Prophylaxis/Anticoagulation: Pharmaceutical: Coumadin , INR remains low, lovenox until INR>2.0 2. Pain Management: N/A.  3. Mood: No signs of distress. Has supportive  family. Will have LCSW follow for evaluation.  4. Neuropsych: This patient is intermittently capable of making decisions on her own behalf.  5. Severe dysphagia: Continue TF and water flushes. Hold  Lasix for azotemia and monitor renal status closely.  6. H/o PUD/stomach cancer: add PPI  7. CKD: Baseline BUN/Cr--41/1.73. Resume sodium bicarb and rocaltrol.   LOS (Days) 6 A FACE TO FACE EVALUATION WAS PERFORMED  [name] E 09/11/2013, 9:10 AM

## 2013-09-11 NOTE — Progress Notes (Signed)
Social Work Patient ID: [name], female   DOB: 1926/05/18, 79 y.o.   MRN: [id number] Met with pt and son to inform of team conference goals-supervision/min level and discharge 10/7.  Both very pleased with  Her progress and passing her swallow test.  Son will provide the care at home of mom.  Discussed follow up needs and both want home health.

## 2013-09-11 NOTE — Patient Care Conference (Signed)
Inpatient RehabilitationTeam Conference and Plan of Care Update Date: 09/11/2013   Time: 11:50 Am    Patient Name: [name]      Medical Record Number: [id number]  Date of Birth: 12/05/1926 Sex: Female         Room/Bed: 4W09C/4W09C-01 Payor Info: Payor: MEDICARE / Plan: MEDICARE PART A AND B / Product Type: *No Product type* /    Admitting Diagnosis: LT CVA  Admit Date/Time:  09/05/2013  4:37 PM Admission Comments: No comment available   Primary Diagnosis:  <principal problem not specified> Principal Problem: <principal problem not specified>  Patient Active Problem List   Diagnosis Date Noted  . Dysphagia, unspecified(787.20) 09/02/2013  . Dysarthria 09/02/2013  . Palliative care encounter 09/02/2013  . Weakness generalized 09/02/2013  . Atrial fibrillation 09/01/2013  . Stroke, acute, embolic 09/01/2013  . Hypertension 08/29/2013  . Dementia 03/25/2012  . Lung nodule 05/24/2011    Expected Discharge Date: Expected Discharge Date: 09/17/13  Team Members Present: Physician leading conference: Dr. Claudette Laws Social Worker Present: Staci Acosta, LCSW;Becky Anees Vanecek, LCSW Nurse Present: Other (comment) Alphonzo Lemmings Reardon-RN) PT Present: [name], Clearnce Hasten, PT OT Present: Rosalio Loud, OT;Kris Shireen Quan, OT SLP Present: Fae Pippin, Charlie Pitter, SLP PPS Coordinator present : Edson Snowball, Chapman Fitch, RN, CRRN     Current Status/Progress Goal Weekly Team Focus  Medical   upgraded diet to D1 thin  100% po intake with fluid and meals  see above   Bowel/Bladder   Incontinent of bladder. LBM 9/29, 2014  To be continent of bowel and bladder  Monitor any s/s of diarrhea and constipation.   Swallow/Nutrition/ Hydration   Dys.1 textures and thin liquids with multiple swallows and full supervision   least restrictive p.o. intake   carryover of safe swallow strategies, trials of Dys.2 textures   ADL's   mod assist bathing, LB  dressing, and toileting, min assist transfers  supervision overall, min assist toilet and walk-in shower transfer  activity tolerance, balance with focus on LB ADLs   Mobility   supervision-min A   Supervision overall except min A community ambulation   Higher level balance, gait, activity tolerance   Communication   Min assist   Supervision   increase vocal intensity   Safety/Cognition/ Behavioral Observations  at baseline         Pain   n/a  n/a  Monitor any onset of pain.   Skin   Unstagable to sacrum area with allevyn dressing.  No new skin breakdown/infection  Monitor skin q shift.      *See Care Plan and progress notes for long and short-term goals.  Barriers to Discharge: needs 24/7 sup    Possible Resolutions to Barriers:  train caregiver    Discharge Planning/Teaching Needs:  HOme with son who can provide care, very committed to Mom      Team Discussion:  Passed MBS-Dys 1 thin liquids-speech to monitor to advance diet.  IV in PM due to dehydrated.  Son here for family education will provide care at home.  Revisions to Treatment Plan:  None   Continued Need for Acute Rehabilitation Level of Care: The patient requires daily medical management by a physician with specialized training in physical medicine and rehabilitation for the following conditions: Daily direction of a multidisciplinary physical rehabilitation program to ensure safe treatment while eliciting the highest outcome that is of practical value to the patient.: Yes Daily medical management of patient stability for increased activity during participation  in an intensive rehabilitation regime.: Yes Daily analysis of laboratory values and/or radiology reports with any subsequent need for medication adjustment of medical intervention for : Neurological problems  Dietrick Barris, Lemar Livings 09/11/2013, 2:34 PM

## 2013-09-11 NOTE — Progress Notes (Signed)
Speech Language Pathology Daily Session Note  Patient Details  Name: [name] MRN: [id number] Date of Birth: 26-Nov-1926  Today's Date: 09/11/2013 Time: 0830-0900 Time Calculation (min): 30 min  Short Term Goals: Week 1: SLP Short Term Goal 1 (Week 1): Patient will tolerate Dys.1 textures and thin liquids with Mod verbal cues to carryover 2 swallows per bite/sip with no overt s/s of aspiration. SLP Short Term Goal 2 (Week 1): Patient will perform oral motor and pharyngeal strengtheing exercises with Mod verbal cues. SLP Short Term Goal 3 (Week 1): Patient will increase speech intelligibilty at the phrase level with Mod verbal cues to increase vocal intensity and utilize over articulation.  SLP Short Term Goal 4 (Week 1): Patient will demonstrate efficient mastication of Dys.2 textures with carryover of recommended safe swallow strategies with Mod verbal cues.  Skilled Therapeutic Interventions: Skilled treatment session focused on addressing dysphagia goals.  SLP facilitated session with breakfast tray of Dys.1 textures and thin liquids.  Patient required Mod verbal cues of encouragement to utilize recommended safe swallow strategies, i.e. two swallows with each bite/sip as she had intermittent difficulty initiating the second swallow.  Patient demonstrated no overt s/s of aspiration.  Continue with current plan of care.   FIM:  Comprehension Comprehension Mode: Auditory Comprehension: 5-Follows basic conversation/direction: With extra time/assistive device Expression Expression Mode: Verbal Expression: 5-Expresses basic needs/ideas: With extra time/assistive device Social Interaction Social Interaction: 4-Interacts appropriately 75 - 89% of the time - Needs redirection for appropriate language or to initiate interaction. Problem Solving Problem Solving: 3-Solves basic 50 - 74% of the time/requires cueing 25 - 49% of the time Memory Memory: 3-Recognizes or recalls 50 - 74% of the  time/requires cueing 25 - 49% of the time FIM - Eating Eating Activity: 5: Supervision/cues;5: Needs verbal cues/supervision  Pain Pain Assessment Pain Assessment: No/denies pain Pain Score: 0-No pain  Therapy/Group: Individual Therapy  Levora Angel 09/11/2013, 12:02 PM

## 2013-09-11 NOTE — Progress Notes (Signed)
Occupational Therapy Session Note  Patient Details  Name: [name] MRN: [id number] Date of Birth: 1926/03/27  Today's Date: 09/11/2013 Time: 1478-2956 Time Calculation (min): 40 min  Short Term Goals: Week 1:  OT Short Term Goal 1 (Week 1): Pt will complete stand pivot transfer with min assist OT Short Term Goal 2 (Week 1): Pt will complete UB dressing with supervision OT Short Term Goal 3 (Week 1): Pt will complete bathing with min assist at sit <> stand level OT Short Term Goal 4 (Week 1): Pt will complete LB dressing with mod assist  Skilled Therapeutic Interventions/Progress Updates:    Pt seen for ADL retraining with focus on transfers, sit > stand, and decreasing assistance with self-care tasks of bathing and dressing. Pt completed bathing at sink with setup assist and cues to ensure thoroughness. Pt close supervision with sit > stand for hygiene and LB dressing, she continues to stand in flexed position despite cues and is very reliant on UE for balance in standing. Oral hygiene completed in standing with close supervision.  Pt with improved mobility, requiring increased time and cues to attempt before asking for assistance.  Therapy Documentation Precautions:  Precautions Precautions: Fall Precaution Comments: Panda in R nare, memory issues (hx of dementia), pt with both urinary and bowel incontinence. Restrictions Weight Bearing Restrictions: No General:   Vital Signs: Therapy Vitals Pulse Rate: 58 Patient Position, if appropriate: Sitting Pain: Pain Assessment Pain Assessment: No/denies pain Pain Score: 0-No pain  See FIM for current functional status  Therapy/Group: Individual Therapy  Rosalio Loud 09/11/2013, 11:17 AM

## 2013-09-11 NOTE — Progress Notes (Signed)
The skilled treatment note has been reviewed and SLP is in agreement. Lauri Till, M.A., CCC-SLP [phone]  

## 2013-09-11 NOTE — Progress Notes (Signed)
The skilled treatment note has been reviewed and SLP is in agreement. Samie Reasons, M.A., CCC-SLP [phone]  

## 2013-09-11 NOTE — Progress Notes (Signed)
Speech Language Pathology Daily Session Note  Patient Details  Name: [name] MRN: [id number] Date of Birth: [birth year]  Today's Date: 09/11/2013 Time: 1355-1440 Time Calculation (min): 45 min  Short Term Goals: Week 1: SLP Short Term Goal 1 (Week 1): Patient will tolerate Dys.1 textures and thin liquids with Mod verbal cues to carryover 2 swallows per bite/sip with no overt s/s of aspiration. SLP Short Term Goal 2 (Week 1): Patient will perform oral motor and pharyngeal strengtheing exercises with Mod verbal cues. SLP Short Term Goal 3 (Week 1): Patient will increase speech intelligibilty at the phrase level with Mod verbal cues to increase vocal intensity and utilize over articulation.  SLP Short Term Goal 4 (Week 1): Patient will demonstrate efficient mastication of Dys.2 textures with carryover of recommended safe swallow strategies with Mod verbal cues.  Skilled Therapeutic Interventions: Skilled treatment session focused on addressing speech and dysphagia goals.  SLP facilitated session with having the patient perform oral motor and pharyngeal strengthening exercises with the use of an external aid; patient required Mod verbal and visual cues to demonstrate exercises effectively.  SLP also facilitated session with reviewing speech intelligibility strategies, i.e. increase vocal intensity and over articulate at the word and sentence level; patient required Max verbal cues to  utilize strategies at the word level.  SLP also met with patient's son and discussed patient's current treatment and diet upgrade trial to Dys.2 tomorrow with the SLP.  Continue with current plan of care.   FIM:  Comprehension Comprehension Mode: Auditory Comprehension: 5-Follows basic conversation/direction: With extra time/assistive device Expression Expression Mode: Verbal Expression: 5-Expresses basic needs/ideas: With extra time/assistive device Social Interaction Social Interaction: 4-Interacts  appropriately 75 - 89% of the time - Needs redirection for appropriate language or to initiate interaction. Problem Solving Problem Solving: 3-Solves basic 50 - 74% of the time/requires cueing 25 - 49% of the time Memory Memory: 3-Recognizes or recalls 50 - 74% of the time/requires cueing 25 - 49% of the time FIM - Eating Eating Activity: 0: Activity did not occur  Pain Pain Assessment Pain Assessment: No/denies pain Pain Score: 0-No pain  Therapy/Group: Individual Therapy  Levora Angel 09/11/2013, 4:10 PM

## 2013-09-11 NOTE — Progress Notes (Signed)
Physical Therapy Session Note  Patient Details  Name: [name] MRN: [id number] Date of Birth: [birth year]  Today's Date: 09/11/2013 Time: 1191-4782 Time Calculation (min): 40 min  Short Term Goals: Week 1:  PT Short Term Goal 1 (Week 1): STG's=LTG's due to short ELOS  Skilled Therapeutic Interventions/Progress Updates:   Pt resting in bed now with NG tube.  Pt performed supine > sit supervision and ambulation in controlled environment x 150' x 2 with RW and supervision-min A with intermittent verbal cues for upright posture and to maintain safe distance to RW.  Performed bilat UE and LE strengthening and endurance training on Nustep at level 5 resistance x 3 min + 3 min more with on rest break and because pt reporting fatigue to assess HR: 58 bpm.  Discussed with pt falls risk again and how to perform floor > furniture transfer if she were to experience a fall at home.  Assisted pt to floor and performed floor > low furniture transfer with mod-max A secondary to pt very fearful and shoulder weakness and difficulty shifting weight to advance LE and UE from prone > quadruped > tall kneeling > half kneeling to stand.  Returned to room with min A and seated in w/c to rest.  Will D/C floor transfer goal; pt will have assistance of son.     Therapy Documentation Precautions:  Precautions Precautions: Fall Precaution Comments: Panda in R nare, memory issues (hx of dementia), pt with both urinary and bowel incontinence. Restrictions Weight Bearing Restrictions: No Vital Signs: Therapy Vitals Pulse Rate: 58 Patient Position, if appropriate: Sitting Pain: Pain Assessment Pain Assessment: No/denies pain Pain Score: 0-No pain Locomotion : Ambulation Ambulation/Gait Assistance: 4: Min assist   See FIM for current functional status  Therapy/Group: Individual Therapy  [name] [facility] 09/11/2013, 11:24 AM

## 2013-09-11 NOTE — Procedures (Signed)
The assessment has been reviewed and SLP is in agreement. Vamsi Apfel, M.A., CCC-SLP [phone]   

## 2013-09-12 ENCOUNTER — Inpatient Hospital Stay (HOSPITAL_COMMUNITY): Payer: Medicare Other | Admitting: Occupational Therapy

## 2013-09-12 ENCOUNTER — Inpatient Hospital Stay (HOSPITAL_COMMUNITY): Payer: Medicare Other | Admitting: Physical Therapy

## 2013-09-12 ENCOUNTER — Inpatient Hospital Stay (HOSPITAL_COMMUNITY): Payer: Medicare Other | Admitting: Speech Pathology

## 2013-09-12 DIAGNOSIS — G8110 Spastic hemiplegia affecting unspecified side: Secondary | ICD-10-CM

## 2013-09-12 DIAGNOSIS — I6340 Cerebral infarction due to embolism of unspecified cerebral artery: Secondary | ICD-10-CM

## 2013-09-12 DIAGNOSIS — I69920 Aphasia following unspecified cerebrovascular disease: Secondary | ICD-10-CM

## 2013-09-12 LAB — PROTIME-INR: INR: 1.38 (ref 0.00–1.49)

## 2013-09-12 MED ORDER — WARFARIN SODIUM 6 MG PO TABS
6.0000 mg | ORAL_TABLET | Freq: Once | ORAL | Status: AC
  Administered 2013-09-12: 6 mg via ORAL
  Filled 2013-09-12: qty 1

## 2013-09-12 NOTE — Progress Notes (Addendum)
Physical Therapy Weekly Progress Note  Patient Details  Name: [name] MRN: [id number] Date of Birth: February 06, 1926  Today's Date: 09/12/2013 [phone] and  [phone] Time Calculation (min): 30 min and 30 min  Patient has met 3 of 9 long term goals.  Short term goals not set due to estimated length of stay.  Pt is currently supervision for all basic transfers but continues to require min A for gait in controlled and home environment with RW for balance and safety.    Patient continues to demonstrate the following deficits: impaired activity tolerance and endurance, impaired strength, postural control, balance, gait and therefore will continue to benefit from skilled PT intervention to enhance overall performance with activity tolerance, balance, postural control and coordination.  See Patient's Care Plan for progression toward long term goals.  Patient progressing toward long term goals.  Continue plan of care.  Skilled Therapeutic Interventions/Progress Updates:   Pt just finished B&D and requesting hot cup of tea.  Assisted pt with gait in controlled environment x 150' x 2 with RW and min A over tile and carpet with total verbal cues for safety and sequencing at floor transitions to stop prior to placing RW over threshold to prevent catching RW and anterior LOB.  In family room pt performed lateral stepping along counter to make cup of hot tea with intermittent times of no UE support on counter secondary to bilat UE performing preparation task.  Allowed pt to rest and drink tea and eat pudding with full supervision and mod verbal cues for smaller bites and increasing number of swallows to minimize risk of aspiration, pt noted to have a few coughing episodes, SLP alerted.  Returned to room to rest in w/c.    Second session: pt performed ambulation in controlled environment over tile and carpet with RW x 150' x 2 with min A and one verbal cue to recall safety at floor surface transitions and  intermittent cues to maintain upright posture and gaze and safe distance to RW.  Performed bilat UE and LE strengthening and endurance on Nustep at level 5 resistance x 6 minutes total with one rest break at 3 minutes.  Ambulated to day room for lunch with SLP.    Therapy Documentation Precautions:  Precautions Precautions: Fall Precaution Comments: Panda in R nare, memory issues (hx of dementia), pt with both urinary and bowel incontinence. Restrictions Weight Bearing Restrictions: No Pain: Pain Assessment Pain Assessment: No/denies pain Pain Score: 0-No pain Locomotion : Ambulation Ambulation/Gait Assistance: 4: Min assist   See FIM for current functional status  Therapy/Group: Individual Therapy  [name] [facility] 09/12/2013, 12:19 PM

## 2013-09-12 NOTE — Progress Notes (Signed)
Speech Language Pathology Daily & Weekly Session Note  Patient Details  Name: [name] MRN: [id number] Date of Birth: September 27, 1926  Today's Date: 09/12/2013 Time: 1130-1215 Time Calculation (min): 45 min  Short Term Goals: Week 1: SLP Short Term Goal 1 (Week 1): Patient will tolerate Dys.1 textures and thin liquids with Mod verbal cues to carryover 2 swallows per bite/sip with no overt s/s of aspiration. SLP Short Term Goal 2 (Week 1): Patient will perform oral motor and pharyngeal strengtheing exercises with Mod verbal cues. SLP Short Term Goal 3 (Week 1): Patient will increase speech intelligibilty at the phrase level with Mod verbal cues to increase vocal intensity and utilize over articulation.  SLP Short Term Goal 4 (Week 1): Patient will demonstrate efficient mastication of Dys.2 textures with carryover of recommended safe swallow strategies with Mod verbal cues.  Skilled Therapeutic Interventions: Skilled treatment session focused on addressing speech and dysphagia goals.  SLP facilitated session with patient's lunch tray of Dys.1 textures and thin liquids; patient required Mod verbal cues to utilize compensatory strategies, i.e. two swallows per bite/sip.  Additionally during meal, patient reflexively coughed x2 which SLP suspects cleared sensed penetrants.  SLP also completed trials of Dys.2 textures; patient required Max verbal cues to utilize compensatory strategies during trials.  Patient demonstrated intermittent wet vocal quality during Dys.2 trials which SLP suspects was due to increased spillage into vallecular space.  Patient also required Max faded to Mod verbal cues to clear throat or cough when wet vocal quality was present; patient began self-monitoring voice quality toward the end of session.  SLP also facilitated session with patient utilizing speech intelligibility strategies, i.e. increase vocal intensity and over articulate at the phrase level; patient required Mod verbal  cues to  utilize strategies at the phrase level.  Recommend diet upgrade with tray of Dys.2 textures with SLP supervision, otherwise continue with current plan of care.   FIM:  Comprehension Comprehension Mode: Auditory Comprehension: 5-Follows basic conversation/direction: With no assist Expression Expression Mode: Verbal Expression: 5-Expresses basic needs/ideas: With extra time/assistive device Social Interaction Social Interaction: 4-Interacts appropriately 75 - 89% of the time - Needs redirection for appropriate language or to initiate interaction. Problem Solving Problem Solving: 3-Solves basic 50 - 74% of the time/requires cueing 25 - 49% of the time Memory Memory: 3-Recognizes or recalls 50 - 74% of the time/requires cueing 25 - 49% of the time FIM - Eating Eating Activity: 5: Needs verbal cues/supervision  Pain Pain Assessment Pain Assessment: No/denies pain Pain Score: 0-No pain  Therapy/Group: Individual Therapy   Speech Language Pathology Weekly Progress Note  Patient Details  Name: [name] MRN: [id number] Date of Birth: 01-08-1926  Today's Date: 09/12/2013  Short Term Goals: Week 1: SLP Short Term Goal 1 (Week 1): Patient will tolerate Dys.1 textures and thin liquids with Mod verbal cues to carryover 2 swallows per bite/sip with no overt s/s of aspiration. SLP Short Term Goal 1 - Progress (Week 1): Met SLP Short Term Goal 2 (Week 1): Patient will perform oral motor and pharyngeal strengtheing exercises with Mod verbal cues. SLP Short Term Goal 2 - Progress (Week 1): Met SLP Short Term Goal 3 (Week 1): Patient will increase speech intelligibilty at the phrase level with Mod verbal cues to increase vocal intensity and utilize over articulation.  SLP Short Term Goal 3 - Progress (Week 1): Met SLP Short Term Goal 4 (Week 1): Patient will demonstrate efficient mastication of Dys.2 textures with carryover of recommended safe  swallow strategies with Mod verbal  cues. SLP Short Term Goal 4 - Progress (Week 1): Met Week 2: SLP Short Term Goal 1 (Week 2): Patient will tolerate Dys.2 textures and thin liquids with Mod verbal cues to carryover 2 swallows per bite/sip with no overt s/s of aspiration. SLP Short Term Goal 2 (Week 2): Patient will perform oral motor and pharyngeal strengtheing exercises with Min verbal cues with external aid. SLP Short Term Goal 3 (Week 2): Patient will increase speech intelligibilty at the phrase level with Min verbal and visual cues to increase vocal intensity and utilize over articulation.   Weekly Progress Updates: Patient met 4 out of 4 short term goals this reporting period.  Patient made gains in toleration of diet advancement from NPO to Dys.1 textures and demonstrating efficient mastication with Dys.2 trials both while utilizing safe swallow strategies.  Patient also increased her intelligibility utilizing speech strategies, i.e. using loud vocal quality and over articulating.  Finally, patient increased her performance of oral motor and pharyngeal strengthening exercises.  Most limiting factors continue to be patient's dysphagia and the utilization of safe-swallow compensatory strategies in addition to her speech intelligibility; all will continue to be focused on during the next reporting period.  It is recommended that the patient continue to receive skilled SLP services to address dysphagia and speech deficits to maximize safety and functional independence, and to reduce burden of care upon discharge home with son.   SLP Intensity: Minumum of 1-2 x/day, 30 to 90 minutes SLP Frequency: 5 out of 7 days SLP Duration/Estimated Length of Stay: Discharge date: 09/17/13 SLP Treatment/Interventions: Cueing hierarchy;Dysphagia/aspiration precaution training;Environmental controls;Functional tasks;Internal/external aids;Oral motor exercises;Patient/family education;Speech/Language facilitation;Other (comment) SLP Eating/Swallowing  Anticipated Outcome(s): Supervision SLP Communication Anticipated Outcome(s): Supervision SLP Cognition Anticipated Outcome(s): n/a   Levora Angel 09/12/2013, 3:25 PM

## 2013-09-12 NOTE — Progress Notes (Signed)
The skilled treatment and weekly progress notes have been reviewed and SLP is in agreement. Nikoleta Dady, M.A., CCC-SLP [phone]  

## 2013-09-12 NOTE — Progress Notes (Signed)
Subjective/Complaints: Ate <50% meal Started megace yest Review of Systems - Not accurate secondary to dementia and aphasia  Objective: Vital Signs: Blood pressure 136/64, pulse 50, temperature 97.7 F (36.5 C), temperature source Oral, resp. rate 19, weight 46.222 kg (101 lb 14.4 oz), SpO2 95.00%. Dg Swallowing Func-speech Pathology  09/11/2013   Levora Angel, Student-SLP     09/11/2013  4:08 PM Objective Swallowing Evaluation:    Patient Details  Name: [name] MRN: [id number] Date of Birth: Aug 27, 1926  Today's Date: 09/10/2013 Time: 0900-0930 Time Calculation (min): 30 min  Past Medical History:  Past Medical History  Diagnosis Date  . Dementia   . Hypertension   . Ulcer     Hx of  . Stomach cancer   . Snoring   . Fracture, intertrochanteric, left femur 03/25/2012  . Fracture, intertrochanteric, right femur 06/2008    s/p closed reduction and internal fixation  . Nodule of left lung     followed by Dr. Vassie Loll // LLL spiculated nodule noted per CT in  2012 1.5x1.6x2.4, PET in 06/03/2011 was low uptake.   Past Surgical History:  Past Surgical History  Procedure Laterality Date  . Stomach surgery      3/4 of stomach removed due to stomach cancer  . Hip surgery  2009    right  . Total hip arthroplasty  03/29/2012    Procedure: TOTAL HIP ARTHROPLASTY;  Surgeon: Eulas Post,  MD;  Location: [facility] OR;  Service: Orthopedics;  Laterality: Left;   Left Hip Hemiarthroplasty   HPI:  [name] is an 79 y.o. RH-female with a history of  hypertension, dementia, stomach cancer and peptic ulcer disease,  presenting with new onset of inability to speak. Patient  reportedly was inattentive to those around her and attempted to  speak but could not utter any sounds and was aware that she could  not speak. CT scan of her head showed no acute intracranial  abnormality. Her ability to speak improved markedly while being  evaluated in the emergency room. MRI/MRA brain done revealing  small/patchy left MCA infarct and no  stenosis. 2D echo with EF  65-70% and grade 1 diastolic dysfunction. 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 Swallow  evaluation done and NPO recommended. Panda placed for nutritional  support. She was noted to be in A Fib but developed bradycardia  on IV Cardizem. Dr. Johney Frame following for input and recommended  amiodarone, avoiding PPM due to advanced age and coumadin for  secondary stroke prevention. MTSB recommending discussion with  family regarding pro/cons of anticoagulation. Blood pressures  labile and HCTZ added today for better control. Therapies ongoing  and team recommending CIR for progression. Speech intelligibility  improving and working on dysphagia exercises--recommending hold  off on PEG. Patient admitted to [facility]  09/05/13.  Patient has been participating in therapies with  improvements noted in swallow function at the bedside.  Given  previous results with impaired sensation, a repeat objective  assessment is warranted.    Recommendation/Prognosis  Clinical Impression Dysphagia Diagnosis: Mild oral phase dysphagia;Mild-Moderate  pharyngeal phase dysphagia Clinical impression: Patient exhibited functional improvement as  compared to previous objective assessment.  Patient now  demonstrates a mild oral dysphagia and mild-moderate pharyngeal  dysphagia.  Impairments include: delay in oral transit; premature  spillage to vallecular space; delayed swallow initiation; residue  at the base of tongue, vallecular space, posterior pharyngeal  wall, and lateral channels.  Initial  trial resulted in trace  aspiration; given this occurred only once, aspiration risk  remains low.  Nectar-thick and thin liquid sips from cup resulted  in occasional episodes of trace penetration with spontaneous  throat clears.  Overall, patient presented with inconsistent  sensation.  Penetration occurred with due to large volume of  liquids and patient tilting her head back.   SLP cued patient to  swallow twice per bite/sip which effectively reduced residue.     Recommend to proceed with diet of Dys.1 consistencies and thin  liquids via cup with small portions, slow pace, and two swallows  per bite/sip.  Given baseline cognitive deficits, patient  requires full supervision to recall and utilize safe swallow  strategies.  SLP will observe toleration of upgrade during meal  and further diet upgrades will be evaluated with bedside trials.  Swallow Evaluation Recommendations Diet Recommendations: Dysphagia 1 (Puree);Thin liquid Liquid Administration via: Cup Medication Administration: Whole meds with puree Supervision: Patient able to self feed;Full supervision/cueing  for compensatory strategies Compensations: Small sips/bites;Multiple dry swallows after each  bite/sip Postural Changes and/or Swallow Maneuvers: Seated upright 90  degrees Oral Care Recommendations: Oral care QID Recommendations for Other Services: Rehab consult Follow up Recommendations: 24 hour supervision/assistance Prognosis Prognosis for Safe Diet Advancement: Good Barriers to Reach Goals: Cognitive deficits (dementia) Individuals Consulted Consulted and Agree with Results and Recommendations:  Patient;Family member/caregiver Family Member Consulted: son  SLP Assessment/Plan   Short Term Goals: Week 1: SLP Short Term Goal 1 (Week 1): Patient will tolerate  Dys.1 textures and thin liquids with Mod verbal cues to carryover  2 swallows per bite/sip with no overt s/s of aspiration. SLP Short Term Goal 2 (Week 1): Patient will perform oral motor  and pharyngeal strengtheing exercises with Mod verbal cues. SLP Short Term Goal 3 (Week 1): Patient will increase speech  intelligibilty at the phrase level with Mod verbal cues to  increase vocal intensity and utilize over articulation.  SLP Short Term Goal 4 (Week 1): Patient will demonstrate  efficient mastication of Dys.2 textures with carryover of  recommended safe swallow  strategies with Mod verbal cues.  General:  Date of Onset: 08/29/13 HPI: [name] is an 79 y.o. RH-female with a history of  hypertension, dementia, stomach cancer and peptic ulcer disease,  presenting with new onset of inability to speak. Patient  reportedly was inattentive to those around her and attempted to  speak but could not utter any sounds and was aware that she could  not speak. CT scan of her head showed no acute intracranial  abnormality. Her ability to speak improved markedly while being  evaluated in the emergency room. MRI/MRA brain done revealing  small/patchy left MCA infarct and no stenosis. 2D echo with EF  65-70% and grade 1 diastolic dysfunction. 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 Swallow  evaluation done and NPO recommended. Panda placed for nutritional  support. She was noted to be in A Fib but developed bradycardia  on IV Cardizem. Dr. Johney Frame following for input and recommended  amiodarone, avoiding PPM due to advanced age and coumadin for  secondary stroke prevention. MTSB recommending discussion with  family regarding pro/cons of anticoagulation. Blood pressures  labile and HCTZ added today for better control. Therapies ongoing  and team recommending CIR for progression. Speech intelligibility  improving and working on dysphagia exercises--recommending hold  off on PEG. Patient admitted to [facility]  09/05/13.   Type of Study:  Modified Barium Swallowing Study Reason for Referral: Objectively evaluate swallowing function Previous Swallow Assessment: 9/22 MBS Diet Prior to this Study: NPO;Panda Temperature Spikes Noted: No Respiratory Status: Room air History of Recent Intubation: No Behavior/Cognition: Alert;Cooperative;Pleasant mood;Requires  cueing Oral Cavity - Dentition: Adequate natural dentition Self-Feeding Abilities: Able to feed self;Needs assist;Needs set  up Patient Positioning: Upright in chair Baseline Vocal Quality:  Low vocal intensity Volitional Cough: Weak Volitional Swallow: Able to elicit  Oral Phase Oral Preparation/Oral Phase Oral Phase: Impaired Oral - Honey Oral - Honey Teaspoon: Not tested Oral - Nectar Oral - Nectar Teaspoon: Delayed oral transit;Lingual/palatal  residue Oral - Nectar Cup: Lingual/palatal residue;Delayed oral transit Oral - Nectar Straw: Not tested Oral - Thin Oral - Thin Teaspoon: Lingual/palatal residue;Delayed oral  transit Oral - Thin Cup: Delayed oral transit;Lingual/palatal residue  (premature spillage to vallecular space) Oral - Thin Straw: Lingual/palatal residue;Delayed oral transit Oral - Solids Oral - Puree: Delayed oral transit;Lingual/palatal residue Oral - Mechanical Soft: Lingual/palatal residue;Delayed oral  transit Oral Phase - Comment Oral Phase - Comment: Residue reduced with two swallows with all  consistencies  Pharyngeal Phase  Pharyngeal Phase Pharyngeal Phase: Impaired Pharyngeal - Honey Pharyngeal - Honey Teaspoon: Not tested Pharyngeal - Nectar Pharyngeal - Nectar Teaspoon: Premature spillage to  valleculae;Delayed swallow initiation;Pharyngeal residue -  posterior pharnyx;Pharyngeal residue - valleculae;Lateral channel  residue Penetration/Aspiration details (nectar teaspoon): Material does  not enter airway Pharyngeal - Nectar Cup: Delayed swallow initiation;Premature  spillage to valleculae;Lateral channel residue;Pharyngeal residue  - valleculae;Pharyngeal residue - posterior  pharnyx;Penetration/Aspiration during swallow;Trace aspiration Penetration/Aspiration details (nectar cup): Material enters  airway, passes BELOW cords then ejected out (x1) Pharyngeal - Nectar Straw: Not tested Pharyngeal - Thin Pharyngeal - Thin Teaspoon: Premature spillage to  valleculae;Pharyngeal residue - posterior pharnyx;Lateral channel  residue;Pharyngeal residue - valleculae;Delayed swallow  initiation Penetration/Aspiration details (thin teaspoon): Material does not  enter airway  Pharyngeal - Thin Cup: Premature spillage to valleculae;Delayed  swallow initiation;Lateral channel residue;Pharyngeal residue -  valleculae;Pharyngeal residue - posterior  pharnyx;Penetration/Aspiration during swallow;Trace aspiration  (x1) Penetration/Aspiration details (thin cup): Material enters  airway, passes BELOW cords then ejected out Pharyngeal - Thin Straw: Premature spillage to  valleculae;Pharyngeal residue - posterior pharnyx;Lateral channel  residue;Pharyngeal residue - valleculae;Delayed swallow  initiation Pharyngeal - Solids Pharyngeal - Puree: Premature spillage to valleculae;Pharyngeal  residue - valleculae;Pharyngeal residue - posterior  pharnyx;Lateral channel residue;Delayed swallow initiation Penetration/Aspiration details (puree): Material does not enter  airway Pharyngeal - Mechanical Soft: Premature spillage to  valleculae;Pharyngeal residue - valleculae;Lateral channel  residue;Delayed swallow initiation;Pharyngeal residue - posterior  pharnyx Pharyngeal Phase - Comment Pharyngeal Comment: Residue was cleared with two swallows  Cervical Esophageal Phase  Cervical Esophageal Phase Cervical Esophageal Phase: Doren Custard 09/10/2013, 11:50 AM     Results for orders placed during the hospital encounter of 09/05/13 (from the past 72 hour(s))  URINALYSIS, ROUTINE W REFLEX MICROSCOPIC     Status: None   Collection Time    09/09/13 11:57 AM      Result Value Range   Color, Urine YELLOW  YELLOW   APPearance CLEAR  CLEAR   Specific Gravity, Urine 1.010  1.005 - 1.030   pH 8.0  5.0 - 8.0   Glucose, UA NEGATIVE  NEGATIVE mg/dL   Hgb urine dipstick NEGATIVE  NEGATIVE   Bilirubin Urine NEGATIVE  NEGATIVE   Ketones, ur NEGATIVE  NEGATIVE mg/dL   Protein, ur NEGATIVE  NEGATIVE mg/dL   Urobilinogen, UA 0.2  0.0 - 1.0 mg/dL   Nitrite NEGATIVE  NEGATIVE   Leukocytes, UA NEGATIVE  NEGATIVE   Comment: MICROSCOPIC NOT DONE ON URINES WITH NEGATIVE PROTEIN, BLOOD, LEUKOCYTES, NITRITE,  OR GLUCOSE <1000 mg/dL.  URINE CULTURE     Status: None   Collection Time    09/09/13 11:57 AM      Result Value Range   Specimen Description URINE, CATHETERIZED     Special Requests NONE     Culture  Setup Time       Value: 09/09/2013 15:04     Performed at [facility] Count       Value: NO GROWTH     Performed at [facility]   Culture       Value: NO GROWTH     Performed at [facility]   Report Status 09/10/2013 FINAL    GLUCOSE, CAPILLARY     Status: Abnormal   Collection Time    09/09/13  1:09 PM      Result Value Range   Glucose-Capillary 153 (*) 70 - 99 mg/dL  GLUCOSE, CAPILLARY     Status: Abnormal   Collection Time    09/09/13  5:22 PM      Result Value Range   Glucose-Capillary 145 (*) 70 - 99 mg/dL   Comment 1 Notify RN    GLUCOSE, CAPILLARY     Status: Abnormal   Collection Time    09/09/13  7:18 PM      Result Value Range   Glucose-Capillary 152 (*) 70 - 99 mg/dL   Comment 1 Notify RN    GLUCOSE, CAPILLARY     Status: Abnormal   Collection Time    09/10/13 12:08 AM      Result Value Range   Glucose-Capillary 147 (*) 70 - 99 mg/dL  GLUCOSE, CAPILLARY     Status: Abnormal   Collection Time    09/10/13  7:09 AM      Result Value Range   Glucose-Capillary 140 (*) 70 - 99 mg/dL   Comment 1 Notify RN    PROTIME-INR     Status: None   Collection Time    09/10/13  8:08 AM      Result Value Range   Prothrombin Time 12.6  11.6 - 15.2 seconds   INR 0.96  0.00 - 1.49  CBC     Status: Abnormal   Collection Time    09/10/13  8:08 AM      Result Value Range   WBC 7.3  4.0 - 10.5 K/uL   RBC 2.96 (*) 3.87 - 5.11 MIL/uL   Hemoglobin 9.2 (*) 12.0 - 15.0 g/dL   HCT 45.4 (*) 09.8 - 11.9 %   MCV 96.6  78.0 - 100.0 fL   MCH 31.1  26.0 - 34.0 pg   MCHC 32.2  30.0 - 36.0 g/dL   RDW 14.7  82.9 - 56.2 %   Platelets 209  150 - 400 K/uL  BASIC METABOLIC PANEL     Status: Abnormal   Collection Time    09/10/13  8:08 AM      Result  Value Range   Sodium 134 (*) 135 - 145 mEq/L   Potassium 5.0  3.5 - 5.1 mEq/L   Chloride 97  96 - 112 mEq/L   CO2 28  19 - 32 mEq/L   Glucose, Bld 143 (*) 70 - 99 mg/dL   BUN 40 (*) 6 - 23 mg/dL  Creatinine, Ser 1.61 (*) 0.50 - 1.10 mg/dL   Calcium 8.6  8.4 - 16.1 mg/dL   GFR calc non Af Amer 28 (*) >90 mL/min   GFR calc Af Amer 33 (*) >90 mL/min   Comment: (NOTE)     The eGFR has been calculated using the CKD EPI equation.     This calculation has not been validated in all clinical situations.     eGFR's persistently <90 mL/min signify possible Chronic Kidney     Disease.  GLUCOSE, CAPILLARY     Status: Abnormal   Collection Time    09/10/13 11:53 AM      Result Value Range   Glucose-Capillary 115 (*) 70 - 99 mg/dL   Comment 1 Notify RN    GLUCOSE, CAPILLARY     Status: Abnormal   Collection Time    09/10/13  6:46 PM      Result Value Range   Glucose-Capillary 161 (*) 70 - 99 mg/dL   Comment 1 Notify RN    PROTIME-INR     Status: None   Collection Time    09/11/13  6:20 AM      Result Value Range   Prothrombin Time 13.4  11.6 - 15.2 seconds   INR 1.04  0.00 - 1.49  PROTIME-INR     Status: Abnormal   Collection Time    09/12/13  6:25 AM      Result Value Range   Prothrombin Time 16.6 (*) 11.6 - 15.2 seconds   INR 1.38  0.00 - 1.49      Nursing note and vitals reviewed.  Constitutional: She is oriented to person, place, and time. She appears cachectic.  Elderly female  HENT:  Head: Normocephalic and atraumatic.  Panda in right nares  Eyes: Conjunctivae are normal. Pupils are equal, round, and reactive to light.  Neck: Normal range of motion. Neck supple.  Cardiovascular: Normal rate and regular rhythm.  Pulmonary/Chest: Effort normal and breath sounds normal. No respiratory distress.  Abdominal: Soft. Bowel sounds are normal. She exhibits no distension. There is no tenderness.  Musculoskeletal: She exhibits no edema. Right > Left RTC shoulder contracture with  poor active abduction, Severe R hallux valgus Neurological: She is alert and oriented to person, place,not time Speech soft, slow, hesitant with moderately dysarthria. Able to follow simple commands. RUE weakness proximally.  Skin: Skin is warm and dry.  Neuro:  Eyes without evidence of nystagmus  Tone is normal without evidence of spasticity   No evidence of trunkal ataxia  Motor strength is in 3-/5 R deltoid, 5/5 Left deltoid deltoid, 5/5 biceps, triceps, finger flexors and extensors, wrist flexors and extensors, hip flexors, knee flexors and extensors, ankle dorsiflexors, plantar flexors, toe flexors and extensors    Assessment/Plan: 1. Functional deficits secondary to Left embolic MCA infarct which require 3+ hours per day of interdisciplinary therapy in a comprehensive inpatient rehab setting. Physiatrist is providing close team supervision and 24 hour management of active medical problems listed below. Physiatrist and rehab team continue to assess barriers to discharge/monitor patient progress toward functional and medical goals.  FIM: FIM - Bathing Bathing Steps Patient Completed: Chest;Right Arm;Abdomen;Front perineal area;Right upper leg;Left upper leg;Left Arm Bathing: 3: Mod-Patient completes 5-7 1f 10 parts or 50-74%  FIM - Upper Body Dressing/Undressing Upper body dressing/undressing steps patient completed: Thread/unthread right sleeve of pullover shirt/dresss;Thread/unthread left sleeve of pullover shirt/dress;Put head through opening of pull over shirt/dress;Pull shirt over trunk Upper body dressing/undressing: 5: Set-up assist to:  Obtain clothing/put away FIM - Lower Body Dressing/Undressing Lower body dressing/undressing steps patient completed: Thread/unthread right underwear leg;Thread/unthread left underwear leg;Thread/unthread right pants leg;Thread/unthread left pants leg;Pull pants up/down;Pull underwear up/down Lower body dressing/undressing: 4: Min-Patient  completed 75 plus % of tasks  FIM - Toileting Toileting steps completed by patient: Performs perineal hygiene;Adjust clothing after toileting Toileting: 3: Mod-Patient completed 2 of 3 steps  FIM - Diplomatic Services operational officer Devices: Grab bars Toilet Transfers: 4-To toilet/BSC: Min A (steadying Pt. > 75%);4-From toilet/BSC: Min A (steadying Pt. > 75%)  FIM - Bed/Chair Transfer Bed/Chair Transfer Assistive Devices: Therapist, occupational: 5: Supine > Sit: Supervision (verbal cues/safety issues);5: Sit > Supine: Supervision (verbal cues/safety issues);5: Bed > Chair or W/C: Supervision (verbal cues/safety issues);5: Chair or W/C > Bed: Supervision (verbal cues/safety issues)  FIM - Locomotion: Wheelchair Distance: 100 Locomotion: Wheelchair: 0: Activity did not occur FIM - Locomotion: Ambulation Locomotion: Ambulation Assistive Devices: Designer, industrial/product Ambulation/Gait Assistance: 4: Min assist Locomotion: Ambulation: 4: Travels 150 ft or more with minimal assistance (Pt.>75%)  Comprehension Comprehension Mode: Auditory Comprehension: 5-Follows basic conversation/direction: With extra time/assistive device  Expression Expression Mode: Verbal Expression: 5-Expresses basic needs/ideas: With extra time/assistive device  Social Interaction Social Interaction: 4-Interacts appropriately 75 - 89% of the time - Needs redirection for appropriate language or to initiate interaction.  Problem Solving Problem Solving: 3-Solves basic 50 - 74% of the time/requires cueing 25 - 49% of the time  Memory Memory: 3-Recognizes or recalls 50 - 74% of the time/requires cueing 25 - 49% of the time  Medical Problem List and Plan:  1. DVT Prophylaxis/Anticoagulation: Pharmaceutical: Coumadin , INR remains low, lovenox until INR>2.0 2. Pain Management: N/A.  3. Mood: No signs of distress. Has supportive family. Will have LCSW follow for evaluation.  4. Neuropsych: This patient is  intermittently capable of making decisions on her own behalf.  5. Severe dysphagia: Continue TF and water flushes. Hold  Lasix for azotemia and monitor renal status closely.  6. H/o PUD/stomach cancer: add PPI  7. CKD: At Baseline BUN/Cr--40/1.6. Cont sodium bicarb and rocaltrol.   LOS (Days) 7 A FACE TO FACE EVALUATION WAS PERFORMED  Sissi Padia E 09/12/2013, 8:13 AM

## 2013-09-12 NOTE — Progress Notes (Signed)
ANTICOAGULATION CONSULT NOTE - Follow Up Consult  Pharmacy Consult for couamdin Indication: atrial fibrillation  No Known Allergies  Patient Measurements: Weight: 101 lb 14.4 oz (46.222 kg) Heparin Dosing Weight:   Vital Signs: Temp: 97.7 F (36.5 C) (10/02 0604) Temp src: Oral (10/02 0604) BP: 136/64 mmHg (10/02 0604) Pulse Rate: 50 (10/02 0604)  Labs:  Recent Labs  09/10/13 0808 09/11/13 0620 09/12/13 0625  HGB 9.2*  --   --   HCT 28.6*  --   --   PLT 209  --   --   LABPROT 12.6 13.4 16.6*  INR 0.96 1.04 1.38  CREATININE 1.61*  --   --     The CrCl is unknown because both a height and weight (above a minimum accepted value) are required for this calculation.   Medications:  Scheduled:  . amiodarone  200 mg Oral Daily  . antiseptic oral rinse  15 mL Mouth Rinse q12n4p  . aspirin EC  81 mg Oral Daily  . atorvastatin  40 mg Oral q1800  . calcitRIOL  0.25 mcg Per Tube Daily  . chlorhexidine  15 mL Mouth Rinse BID  . coumadin book   Does not apply Once  . enoxaparin (LOVENOX) injection  30 mg Subcutaneous Q24H  . megestrol  400 mg Oral BID  . pantoprazole sodium  40 mg Per Tube Daily  . sodium bicarbonate  650 mg Per Tube QID  . Warfarin - Pharmacist Dosing Inpatient   Does not apply q1800   Infusions:  . sodium chloride Stopped (09/12/13 9528)    Assessment: 79 yo female with new onset afib is currently on subtherapeutic coumadin; however, INR is rising.  INR today is 1.38.  Also on lovenox 30mg  sq q24h Goal of Therapy:  INR 2-3 Monitor platelets by anticoagulation protocol: Yes   Plan:  1) Repeat coumadin 6mg  po x1 2) INR in am  Saundra Gin, Tsz-Yin 09/12/2013,8:26 AM

## 2013-09-12 NOTE — Progress Notes (Signed)
Occupational Therapy Session Note  Patient Details  Name: [name] MRN: [id number] Date of Birth: 11-Jul-1926  Today's Date: 09/12/2013 Time: 0830-0930 and 1300-1330 Time Calculation (min): 60 min and 30 min  Short Term Goals: Week 1:  OT Short Term Goal 1 (Week 1): Pt will complete stand pivot transfer with min assist OT Short Term Goal 2 (Week 1): Pt will complete UB dressing with supervision OT Short Term Goal 3 (Week 1): Pt will complete bathing with min assist at sit <> stand level OT Short Term Goal 4 (Week 1): Pt will complete LB dressing with mod assist  Skilled Therapeutic Interventions/Progress Updates:    1) Pt seen for ADL retraining with focus on transfers, sit > stand, and increased participation in self-care tasks of self-feeding, bathing, and dressing.  Pt in bed upon arrival, requesting to have breakfast. Encouraged pt to sit EOB with focus on sitting balance while completing self-feeding, pt required increased time with breakfast secondary to swallowing precautions of 2 swallows after each bite and sip. Supervised pt during breakfast and discussed with patient anticipated D/C date and progress towards goals. Pt with 3 episodes of coughing, 2 at end of meal ?due to fatigue and/or build up of residue. Discussed performing bathing at shower level, to which pt declined and requested to bathe at sink, reporting "too cold".  Pt supervision transfer and sit > stand this session with bathing and dressing, pt continues to require assistance with donning socks.  Completed grooming in standing with supervision.  Pt with increased standing posture this session with ability to alternate UE support while completing oral hygiene.    2) Pt seen for 1:1 OT with focus on activity tolerance and weight shifting in standing. Pt ambulated to therapy gym ~150 feet with RW with supervision and min cues for RW safety with appropriate positioning.  Engaged in standing activity with one UE support  progressing to no UE support with focus on weight shifting to alternate single leg stance with kicking ball.  Pt with decreased standing balance without UE support, therefore utilized this clinicians thigh for stability, progressing to RW on Lt side, and finally attempting in standing with support at shoulder girdle to promote upright standing balance.  Pt with improved weight shifting and clearance of alternating feet with UE support.  Pt returned to room with RW and left seated in w/c with all needs in reach.  Therapy Documentation Precautions:  Precautions Precautions: Fall Precaution Comments: Panda in R nare, memory issues (hx of dementia), pt with both urinary and bowel incontinence. Restrictions Weight Bearing Restrictions: No General:   Vital Signs:   Pain: Pain Assessment Pain Assessment: No/denies pain Pain Score: 0-No pain  See FIM for current functional status  Therapy/Group: Individual Therapy  Rosalio Loud 09/12/2013, 11:53 AM

## 2013-09-13 ENCOUNTER — Inpatient Hospital Stay (HOSPITAL_COMMUNITY): Payer: Medicare Other | Admitting: Occupational Therapy

## 2013-09-13 ENCOUNTER — Inpatient Hospital Stay (HOSPITAL_COMMUNITY): Payer: Medicare Other | Admitting: *Deleted

## 2013-09-13 ENCOUNTER — Inpatient Hospital Stay (HOSPITAL_COMMUNITY): Payer: Medicare Other | Admitting: Speech Pathology

## 2013-09-13 ENCOUNTER — Inpatient Hospital Stay (HOSPITAL_COMMUNITY): Payer: Medicare Other

## 2013-09-13 LAB — BASIC METABOLIC PANEL
CO2: 26 mEq/L (ref 19–32)
Calcium: 8.8 mg/dL (ref 8.4–10.5)
Chloride: 97 mEq/L (ref 96–112)
Creatinine, Ser: 2.2 mg/dL — ABNORMAL HIGH (ref 0.50–1.10)
GFR calc Af Amer: 22 mL/min — ABNORMAL LOW (ref >90)
GFR calc non Af Amer: 19 mL/min — ABNORMAL LOW (ref >90)
Sodium: 136 mEq/L (ref 135–145)

## 2013-09-13 LAB — CBC
HCT: 31 % — ABNORMAL LOW (ref 36.0–46.0)
MCHC: 32.9 g/dL (ref 30.0–36.0)
MCV: 95.4 fL (ref 78.0–100.0)
Platelets: 264 10*3/uL (ref 150–400)
RBC: 3.25 MIL/uL — ABNORMAL LOW (ref 3.87–5.11)
RDW: 14.2 % (ref 11.5–15.5)
WBC: 7.8 10*3/uL (ref 4.0–10.5)

## 2013-09-13 LAB — PROTIME-INR: INR: 1.98 — ABNORMAL HIGH (ref 0.00–1.49)

## 2013-09-13 MED ORDER — WARFARIN SODIUM 5 MG PO TABS
5.0000 mg | ORAL_TABLET | Freq: Once | ORAL | Status: AC
  Administered 2013-09-13: 5 mg via ORAL
  Filled 2013-09-13: qty 1

## 2013-09-13 NOTE — Progress Notes (Signed)
Occupational Therapy Weekly Progress Note  Patient Details  Name: [name] MRN: [id number] Date of Birth: [birth year]  Today's Date: 09/13/2013 Time: 4098-1191 Time Calculation (min): 45 min  Patient has met 4 of 4 short term goals.  Pt is making steady progress towards goals.  She is supervision for basic transfers, but continues to require min assist for toilet and shower transfers as well as with gait in home environment.  Recommend RW for balance and safety with all functional mobility.  Patient continues to demonstrate the following deficits: impaired activity tolerance and endurance, impaired strength, postural control, balance and therefore will continue to benefit from skilled OT intervention to enhance overall performance with BADL and Reduce care partner burden.  Patient progressing toward long term goals..  Continue plan of care.  OT Short Term Goals Week 1:  OT Short Term Goal 1 (Week 1): Pt will complete stand pivot transfer with min assist OT Short Term Goal 1 - Progress (Week 1): Met OT Short Term Goal 2 (Week 1): Pt will complete UB dressing with supervision OT Short Term Goal 2 - Progress (Week 1): Met OT Short Term Goal 3 (Week 1): Pt will complete bathing with min assist at sit <> stand level OT Short Term Goal 3 - Progress (Week 1): Met OT Short Term Goal 4 (Week 1): Pt will complete LB dressing with mod assist OT Short Term Goal 4 - Progress (Week 1): Met Week 2:  OT Short Term Goal 1 (Week 2): STG = LTG due to remaining LOS, supervision overall  Skilled Therapeutic Interventions/Progress Updates:    Pt seen for ADL retraining with focus on activity tolerance, standing balance, and functional transfers in bathroom with RW.  Pt in bed upon arrival, reports already dressed and no clean clothes so declining bathing this session.  Encouraged pt to attempt toileting this session, pt incontinent of bladder in brief but continent bowel movement in toilet.  Close supervision  with standing for perineal hygiene.  Grooming conducted at sink in standing with focus on upright standing balance and use of RUE to obtain items to promote upright standing.  Engaged in [place] on resistance level 3 for 6 minutes with multiple rest breaks.  Pt returned to room and left seated up in w/c with all needs in reach.  Therapy Documentation Precautions:  Precautions Precautions: Fall Precaution Comments: Panda in R nare, memory issues (hx of dementia), pt with both urinary and bowel incontinence. Restrictions Weight Bearing Restrictions: No General:   Vital Signs: Therapy Vitals Temp: 97.9 F (36.6 C) Temp src: Oral Pulse Rate: 54 Resp: 16 BP: 129/56 mmHg Patient Position, if appropriate: Lying Oxygen Therapy SpO2: 96 % O2 Device: None (Room air) Pain: Pain Assessment Pain Assessment: No/denies pain Pain Score: 0-No pain  See FIM for current functional status  Therapy/Group: Individual Therapy  Rosalio Loud 09/13/2013, 9:17 AM

## 2013-09-13 NOTE — Progress Notes (Addendum)
Subjective/Complaints: Ate <50% meal Started megace 10/1 Review of Systems - Not accurate secondary to dementia and aphasia  Objective: Vital Signs: Blood pressure 129/56, pulse 54, temperature 97.9 F (36.6 C), temperature source Oral, resp. rate 16, weight 45.314 kg (99 lb 14.4 oz), SpO2 96.00%. No results found. Results for orders placed during the hospital encounter of 09/05/13 (from the past 72 hour(s))  GLUCOSE, CAPILLARY     Status: Abnormal   Collection Time    09/10/13 11:53 AM      Result Value Range   Glucose-Capillary 115 (*) 70 - 99 mg/dL   Comment 1 Notify RN    GLUCOSE, CAPILLARY     Status: Abnormal   Collection Time    09/10/13  6:46 PM      Result Value Range   Glucose-Capillary 161 (*) 70 - 99 mg/dL   Comment 1 Notify RN    PROTIME-INR     Status: None   Collection Time    09/11/13  6:20 AM      Result Value Range   Prothrombin Time 13.4  11.6 - 15.2 seconds   INR 1.04  0.00 - 1.49  PROTIME-INR     Status: Abnormal   Collection Time    09/12/13  6:25 AM      Result Value Range   Prothrombin Time 16.6 (*) 11.6 - 15.2 seconds   INR 1.38  0.00 - 1.49  PROTIME-INR     Status: Abnormal   Collection Time    09/13/13  6:00 AM      Result Value Range   Prothrombin Time 21.9 (*) 11.6 - 15.2 seconds   INR 1.98 (*) 0.00 - 1.49      Nursing note and vitals reviewed.  Constitutional: She is oriented to person, place, and time. She appears cachectic.  Elderly female  HENT:  Head: Normocephalic and atraumatic.  Panda in right nares  Eyes: Conjunctivae are normal. Pupils are equal, round, and reactive to light.  Neck: Normal range of motion. Neck supple.  Cardiovascular: Normal rate and regular rhythm.  Pulmonary/Chest: Effort normal and breath sounds normal. No respiratory distress.  Abdominal: Soft. Bowel sounds are normal. She exhibits no distension. There is no tenderness.  Musculoskeletal: She exhibits no edema. Right > Left RTC shoulder contracture with  poor active abduction, Severe R hallux valgus Neurological: She is alert and oriented to person, place,not time Speech soft, slow, hesitant with moderately dysarthria. Able to follow simple commands. RUE weakness proximally.  Skin: Skin is warm and dry.  Neuro:  Eyes without evidence of nystagmus  Tone is normal without evidence of spasticity   No evidence of trunkal ataxia  Motor strength is in 3-/5 R deltoid, 5/5 Left deltoid deltoid, 5/5 biceps, triceps, finger flexors and extensors, wrist flexors and extensors, hip flexors, knee flexors and extensors, ankle dorsiflexors, plantar flexors, toe flexors and extensors    Assessment/Plan: 1. Functional deficits secondary to Left embolic MCA infarct which require 3+ hours per day of interdisciplinary therapy in a comprehensive inpatient rehab setting. Physiatrist is providing close team supervision and 24 hour management of active medical problems listed below. Physiatrist and rehab team continue to assess barriers to discharge/monitor patient progress toward functional and medical goals.  FIM: FIM - Bathing Bathing Steps Patient Completed: Chest;Right Arm;Abdomen;Front perineal area;Right upper leg;Left upper leg;Left Arm Bathing: 3: Mod-Patient completes 5-7 62f 10 parts or 50-74%  FIM - Upper Body Dressing/Undressing Upper body dressing/undressing steps patient completed: Thread/unthread right sleeve of pullover  shirt/dresss;Thread/unthread left sleeve of pullover shirt/dress;Put head through opening of pull over shirt/dress;Pull shirt over trunk Upper body dressing/undressing: 5: Set-up assist to: Obtain clothing/put away FIM - Lower Body Dressing/Undressing Lower body dressing/undressing steps patient completed: Thread/unthread right underwear leg;Thread/unthread left underwear leg;Thread/unthread right pants leg;Thread/unthread left pants leg;Pull pants up/down;Pull underwear up/down;Fasten/unfasten pants Lower body dressing/undressing:  4: Min-Patient completed 75 plus % of tasks  FIM - Toileting Toileting steps completed by patient: Adjust clothing prior to toileting;Performs perineal hygiene;Adjust clothing after toileting Toileting Assistive Devices: Grab bar or rail for support Toileting: 5: Supervision: Safety issues/verbal cues  FIM - Diplomatic Services operational officer Devices: Grab bars Toilet Transfers: 4-To toilet/BSC: Min A (steadying Pt. > 75%);4-From toilet/BSC: Min A (steadying Pt. > 75%)  FIM - Bed/Chair Transfer Bed/Chair Transfer Assistive Devices: Therapist, occupational: 5: Bed > Chair or W/C: Supervision (verbal cues/safety issues);5: Chair or W/C > Bed: Supervision (verbal cues/safety issues)  FIM - Locomotion: Wheelchair Distance: 100 Locomotion: Wheelchair: 0: Activity did not occur FIM - Locomotion: Ambulation Locomotion: Ambulation Assistive Devices: Designer, industrial/product Ambulation/Gait Assistance: 4: Min assist Locomotion: Ambulation: 4: Travels 150 ft or more with minimal assistance (Pt.>75%)  Comprehension Comprehension Mode: Auditory Comprehension: 5-Follows basic conversation/direction: With no assist  Expression Expression Mode: Verbal Expression: 5-Expresses basic needs/ideas: With extra time/assistive device  Social Interaction Social Interaction: 4-Interacts appropriately 75 - 89% of the time - Needs redirection for appropriate language or to initiate interaction.  Problem Solving Problem Solving: 3-Solves basic 50 - 74% of the time/requires cueing 25 - 49% of the time  Memory Memory: 3-Recognizes or recalls 50 - 74% of the time/requires cueing 25 - 49% of the time  Medical Problem List and Plan:  1. DVT Prophylaxis/Anticoagulation: Pharmaceutical: Coumadin , INR remains low, lovenox until INR>2.0 2. Pain Management: N/A.  3. Mood: No signs of distress. Has supportive family. Will have LCSW follow for evaluation.  4. Neuropsych: This patient is intermittently  capable of making decisions on her own behalf.  5. Severe dysphagia: no on po Hold  Lasix for azotemia and monitor renal status closely.  6. H/o PUD/stomach cancer: add PPI  7. CKD: At Baseline BUN/Cr--40/1.6. Cont sodium bicarb and rocaltrol.   LOS (Days) 8 A FACE TO FACE EVALUATION WAS PERFORMED  KIRSTEINS,ANDREW E 09/13/2013, 8:41 AM

## 2013-09-13 NOTE — Progress Notes (Signed)
Physical Therapy Session Note  Patient Details  Name: [name] MRN: [id number] Date of Birth: 1926/01/01  Today's Date: 09/13/2013 Time: 1305-1400 Time Calculation (min): 55 min  Skilled Therapeutic Interventions/Progress Updates:  Pt participated in [facility] for gait training, stairs, and strengthening.   Pt performed gait in controlled environment 2x150' 2x75' with RW and close S, including obstacle course.   Pt instructed in supine and seated therex for LE strengthening   Nustep x10 min with bil UE and LE, level 4    Seated rest breaks provided prn.   Therapy Documentation Precautions:  Precautions Precautions: Fall Precaution Comments: Panda in R nare, memory issues (hx of dementia), pt with both urinary and bowel incontinence. Restrictions Weight Bearing Restrictions: No    Pain: Pain Assessment Pain Assessment: No/denies pain  See FIM for current functional status  Therapy/Group: Group Therapy  Clydene Laming, PT, DPT  09/13/2013, 2:32 PM

## 2013-09-13 NOTE — Progress Notes (Signed)
Physical Therapy Note  Patient Details  Name: NAYAH LUKENS MRN: [id number] Date of Birth: October 23, 1926 Today's Date: 09/13/2013  9:30 - 10:15 45 minutes Individual session Patient denies pain.  Patient sitting in wheelchair upon entering room. Patient pushed in wheelchair to gym. Patient ambulated with RW in apartment on carpet simulating home environment x 50 feet practicing transfers from bed to love seat to kitchen chair with supervision. Patient supine <> sit and performed bed mobility independently. Patient ambulated around and over obstacles and on foam mat with RW and supervision. Patient performed [place] exercises x 12 in standing with supervision. Patient ambulated 160 feet back to room with RW and supervision and cueing for directions to find room. Patient left in wheelchair with quick release belt in place and all items in reach.   Arelia Longest M 09/13/2013, 12:17 PM

## 2013-09-13 NOTE — Progress Notes (Signed)
Speech Language Pathology Daily Session Note  Patient Details  Name: [name] MRN: [id number] Date of Birth: [birth year]  Today's Date: 09/13/2013 Time: 1130-1218 Time Calculation (min): 48 min  Short Term Goals: Week 2: SLP Short Term Goal 1 (Week 2): Patient will tolerate Dys.2 textures and thin liquids with Mod verbal cues to carryover 2 swallows per bite/sip with no overt s/s of aspiration. SLP Short Term Goal 2 (Week 2): Patient will perform oral motor and pharyngeal strengtheing exercises with Min verbal cues with external aid. SLP Short Term Goal 3 (Week 2): Patient will increase speech intelligibilty at the phrase level with Min verbal and visual cues to increase vocal intensity and utilize over articulation.   Skilled Therapeutic Interventions: Skilled treatment session focused on addressing dysphagia goals.  SLP facilitated session with patient's lunch tray of Dys.1, Dys.2 textures and thin liquids; patient required Min verbal cues to utilize compensatory strategies, i.e. two swallows per bite/sip.  Patient demonstrated intermittent throat clears and reflexive coughed x1 which SLP suspects was a result of sensed residue.  SLP educated patient regarding need to self-monitor vocal quality and when wetness is audible to clear throat and swallow again. Recommend continuation of Dys.2 textures with full supervision.   FIM:  Comprehension Comprehension: 5-Follows basic conversation/direction: With no assist Expression Expression Mode: Verbal Expression: 5-Expresses basic needs/ideas: With extra time/assistive device Social Interaction Social Interaction: 4-Interacts appropriately 75 - 89% of the time - Needs redirection for appropriate language or to initiate interaction. Problem Solving Problem Solving: 3-Solves basic 50 - 74% of the time/requires cueing 25 - 49% of the time Memory Memory: 3-Recognizes or recalls 50 - 74% of the time/requires cueing 25 - 49% of the time FIM -  Eating Eating Activity: 5: Needs verbal cues/supervision  Pain Pain Assessment Pain Assessment: No/denies pain  Therapy/Group: Individual Therapy  Charlane Ferretti., CCC-SLP [phone]  [name] 09/13/2013, 12:54 PM

## 2013-09-13 NOTE — Progress Notes (Signed)
ANTICOAGULATION CONSULT NOTE - Follow Up Consult  Pharmacy Consult for coumadin Indication: afib and new CVA  No Known Allergies  Patient Measurements: Weight: 99 lb 14.4 oz (45.314 kg) Heparin Dosing Weight:   Vital Signs: Temp: 97.9 F (36.6 C) (10/03 0642) Temp src: Oral (10/03 0642) BP: 129/56 mmHg (10/03 0642) Pulse Rate: 54 (10/03 0642)  Labs:  Recent Labs  09/11/13 0620 09/12/13 0625 09/13/13 0600  LABPROT 13.4 16.6* 21.9*  INR 1.04 1.38 1.98*    The CrCl is unknown because both a height and weight (above a minimum accepted value) are required for this calculation.   Medications:  Scheduled:  . amiodarone  200 mg Oral Daily  . antiseptic oral rinse  15 mL Mouth Rinse q12n4p  . aspirin EC  81 mg Oral Daily  . atorvastatin  40 mg Oral q1800  . calcitRIOL  0.25 mcg Per Tube Daily  . chlorhexidine  15 mL Mouth Rinse BID  . coumadin book   Does not apply Once  . enoxaparin (LOVENOX) injection  30 mg Subcutaneous Q24H  . megestrol  400 mg Oral BID  . pantoprazole sodium  40 mg Per Tube Daily  . sodium bicarbonate  650 mg Per Tube QID  . Warfarin - Pharmacist Dosing Inpatient   Does not apply q1800   Infusions:  . sodium chloride Stopped (09/13/13 4098)    Assessment: 79 yo female with afib and new CVA is currently on subtherapeutic coumadin, but INR has jumped from 1.38 to 1.98.  On lovenox 30mg  sq q24h. Goal of Therapy:  INR 2-3 Monitor platelets by anticoagulation protocol: Yes   Plan:  -Warfarin 5 mg x 1 today (probably need 5mg  daily; 6mg  may be too much) -Daily PT/INR -Monitor for bleeding -DC SQ lovenox 30mg  q24h after INR is >2   Beverlyn Mcginness, Tsz-Yin 09/13/2013,8:30 AM

## 2013-09-13 NOTE — Plan of Care (Signed)
Problem: RH BOWEL ELIMINATION Goal: RH STG MANAGE BOWEL W/MEDICATION W/ASSISTANCE STG Manage Bowel with Medication with minimal Assistance.  Outcome: Not Progressing Required suppository to produce BM

## 2013-09-14 ENCOUNTER — Inpatient Hospital Stay (HOSPITAL_COMMUNITY): Payer: Medicare Other | Admitting: Physical Therapy

## 2013-09-14 DIAGNOSIS — G8110 Spastic hemiplegia affecting unspecified side: Secondary | ICD-10-CM

## 2013-09-14 DIAGNOSIS — I6340 Cerebral infarction due to embolism of unspecified cerebral artery: Secondary | ICD-10-CM

## 2013-09-14 DIAGNOSIS — I69920 Aphasia following unspecified cerebrovascular disease: Secondary | ICD-10-CM

## 2013-09-14 LAB — PROTIME-INR: Prothrombin Time: 27.6 seconds — ABNORMAL HIGH (ref 11.6–15.2)

## 2013-09-14 MED ORDER — WARFARIN SODIUM 3 MG PO TABS
3.0000 mg | ORAL_TABLET | Freq: Once | ORAL | Status: AC
  Administered 2013-09-14: 3 mg via ORAL
  Filled 2013-09-14: qty 1

## 2013-09-14 NOTE — Progress Notes (Signed)
Physical Therapy Note  Patient Details  Name: PAGE PUCCIARELLI MRN: [id number] Date of Birth: [birth year] Today's Date: 09/14/2013  1300-1355 (55 minutes) group Pain: no reported pain Pt participated in PT group session focused on gait training; safety; standing balance; activity tolerance.  Pt ambulates 80 feet X 3 RW close SBA with vcs to stay within AD.   Avanni Turnbaugh,JIM 09/14/2013, 1:23 PM

## 2013-09-14 NOTE — Progress Notes (Signed)
Subjective/Complaints: Eating a little better 50-80%. Denies pain or other complaints this am Review of Systems - Not accurate secondary to dementia and aphasia  Objective: Vital Signs: Blood pressure 118/59, pulse 109, temperature 97.3 F (36.3 C), temperature source Oral, resp. rate 16, weight 43.5 kg (95 lb 14.4 oz), SpO2 98.00%. No results found. Results for orders placed during the hospital encounter of 09/05/13 (from the past 72 hour(s))  PROTIME-INR     Status: Abnormal   Collection Time    09/12/13  6:25 AM      Result Value Range   Prothrombin Time 16.6 (*) 11.6 - 15.2 seconds   INR 1.38  0.00 - 1.49  PROTIME-INR     Status: Abnormal   Collection Time    09/13/13  6:00 AM      Result Value Range   Prothrombin Time 21.9 (*) 11.6 - 15.2 seconds   INR 1.98 (*) 0.00 - 1.49  BASIC METABOLIC PANEL     Status: Abnormal   Collection Time    09/13/13  9:20 AM      Result Value Range   Sodium 136  135 - 145 mEq/L   Potassium 4.1  3.5 - 5.1 mEq/L   Chloride 97  96 - 112 mEq/L   CO2 26  19 - 32 mEq/L   Glucose, Bld 130 (*) 70 - 99 mg/dL   BUN 40 (*) 6 - 23 mg/dL   Creatinine, Ser 9.56 (*) 0.50 - 1.10 mg/dL   Calcium 8.8  8.4 - 21.3 mg/dL   GFR calc non Af Amer 19 (*) >90 mL/min   GFR calc Af Amer 22 (*) >90 mL/min   Comment: (NOTE)     The eGFR has been calculated using the CKD EPI equation.     This calculation has not been validated in all clinical situations.     eGFR's persistently <90 mL/min signify possible Chronic Kidney     Disease.  CBC     Status: Abnormal   Collection Time    09/13/13  9:20 AM      Result Value Range   WBC 7.8  4.0 - 10.5 K/uL   RBC 3.25 (*) 3.87 - 5.11 MIL/uL   Hemoglobin 10.2 (*) 12.0 - 15.0 g/dL   HCT 08.6 (*) 57.8 - 46.9 %   MCV 95.4  78.0 - 100.0 fL   MCH 31.4  26.0 - 34.0 pg   MCHC 32.9  30.0 - 36.0 g/dL   RDW 62.9  52.8 - 41.3 %   Platelets 264  150 - 400 K/uL  PROTIME-INR     Status: Abnormal   Collection Time    09/14/13  7:05  AM      Result Value Range   Prothrombin Time 27.6 (*) 11.6 - 15.2 seconds   INR 2.68 (*) 0.00 - 1.49      Nursing note and vitals reviewed.  Constitutional: She is oriented to person, place, and time. She appears cachectic.  Elderly female  HENT:  Head: Normocephalic and atraumatic.  Panda in right nares  Eyes: Conjunctivae are normal. Pupils are equal, round, and reactive to light.  Neck: Normal range of motion. Neck supple.  Cardiovascular: Normal rate and regular rhythm.  Pulmonary/Chest: Effort normal and breath sounds normal. No respiratory distress.  Abdominal: Soft. Bowel sounds are normal. She exhibits no distension. There is no tenderness.  Musculoskeletal: She exhibits no edema. Right > Left RTC shoulder contracture with poor active abduction, Severe R hallux valgus  Neurological: She is alert and oriented to person, place,not time Speech soft,  with moderately dysarthria. Able to follow simple commands. RUE weakness proximally.  Skin: Skin is warm and dry.  Neuro:  Eyes without evidence of nystagmus  Tone is normal without evidence of spasticity   No evidence of trunkal ataxia  Motor strength is in 3-/5 R deltoid, 5/5 Left deltoid deltoid, 5/5 biceps, triceps, finger flexors and extensors, wrist flexors and extensors, hip flexors, knee flexors and extensors, ankle dorsiflexors, plantar flexors, toe flexors and extensors    Assessment/Plan: 1. Functional deficits secondary to Left embolic MCA infarct which require 3+ hours per day of interdisciplinary therapy in a comprehensive inpatient rehab setting. Physiatrist is providing close team supervision and 24 hour management of active medical problems listed below. Physiatrist and rehab team continue to assess barriers to discharge/monitor patient progress toward functional and medical goals.  FIM: FIM - Bathing Bathing Steps Patient Completed: Chest;Right Arm;Abdomen;Front perineal area;Right upper leg;Left upper leg;Left  Arm Bathing: 3: Mod-Patient completes 5-7 77f 10 parts or 50-74%  FIM - Upper Body Dressing/Undressing Upper body dressing/undressing steps patient completed: Thread/unthread right sleeve of pullover shirt/dresss;Thread/unthread left sleeve of pullover shirt/dress;Put head through opening of pull over shirt/dress;Pull shirt over trunk Upper body dressing/undressing: 5: Set-up assist to: Obtain clothing/put away FIM - Lower Body Dressing/Undressing Lower body dressing/undressing steps patient completed: Thread/unthread right underwear leg;Thread/unthread left underwear leg;Thread/unthread right pants leg;Thread/unthread left pants leg;Pull pants up/down;Pull underwear up/down;Fasten/unfasten pants Lower body dressing/undressing: 4: Min-Patient completed 75 plus % of tasks  FIM - Toileting Toileting steps completed by patient: Adjust clothing prior to toileting;Performs perineal hygiene;Adjust clothing after toileting Toileting Assistive Devices: Grab bar or rail for support Toileting: 5: Supervision: Safety issues/verbal cues  FIM - Diplomatic Services operational officer Devices: Grab bars;Walker Toilet Transfers: 4-To toilet/BSC: Min A (steadying Pt. > 75%);5-From toilet/BSC: Supervision (verbal cues/safety issues)  FIM - Press photographer Assistive Devices: Arm rests;Walker Bed/Chair Transfer: 5: Bed > Chair or W/C: Supervision (verbal cues/safety issues);5: Chair or W/C > Bed: Supervision (verbal cues/safety issues);5: Supine > Sit: Supervision (verbal cues/safety issues);5: Sit > Supine: Supervision (verbal cues/safety issues)  FIM - Locomotion: Wheelchair Distance: 100 Locomotion: Wheelchair: 0: Activity did not occur FIM - Locomotion: Ambulation Locomotion: Ambulation Assistive Devices: Designer, industrial/product Ambulation/Gait Assistance: 5: Supervision Locomotion: Ambulation: 5: Travels 150 ft or more with supervision/safety issues  Comprehension Comprehension  Mode: Auditory Comprehension: 5-Understands basic 90% of the time/requires cueing < 10% of the time  Expression Expression Mode: Verbal Expression: 5-Expresses basic needs/ideas: With extra time/assistive device  Social Interaction Social Interaction: 4-Interacts appropriately 75 - 89% of the time - Needs redirection for appropriate language or to initiate interaction.  Problem Solving Problem Solving: 3-Solves basic 50 - 74% of the time/requires cueing 25 - 49% of the time  Memory Memory: 3-Recognizes or recalls 50 - 74% of the time/requires cueing 25 - 49% of the time  Medical Problem List and Plan:  1. DVT Prophylaxis/Anticoagulation: Pharmaceutical: Coumadin , INR remains low, lovenox until INR>2.0 2. Pain Management: N/A.  3. Mood: No signs of distress. Has supportive family. Will have LCSW follow for evaluation.  4. Neuropsych: This patient is intermittently capable of making decisions on her own behalf.  5. Severe dysphagia: no on po Hold  Lasix for azotemia and monitor renal status closely.   -megace seems to be helping appetite 6. H/o PUD/stomach cancer: add PPI  7. CKD: At Baseline BUN/Cr--40/1.6. Cont sodium bicarb and rocaltrol.  LOS (Days) 9 A FACE TO FACE EVALUATION WAS PERFORMED  Yamato Kopf T 09/14/2013, 8:36 AM

## 2013-09-14 NOTE — Progress Notes (Signed)
ANTICOAGULATION CONSULT NOTE - Follow Up Consult  Pharmacy Consult:  Coumadin Indication: afib and new CVA  No Known Allergies  Patient Measurements: Height: 5' 4.96" (165 cm) Weight: 95 lb 14.4 oz (43.5 kg) IBW/kg (Calculated) : 56.91  Vital Signs: Temp: 97.3 F (36.3 C) (10/04 0528) Temp src: Oral (10/04 0528) BP: 118/59 mmHg (10/04 0528) Pulse Rate: 109 (10/04 0528)  Labs:  Recent Labs  09/12/13 0625 09/13/13 0600 09/13/13 0920 09/14/13 0705  HGB  --   --  10.2*  --   HCT  --   --  31.0*  --   PLT  --   --  264  --   LABPROT 16.6* 21.9*  --  27.6*  INR 1.38 1.98*  --  2.68*  CREATININE  --   --  2.20*  --     The CrCl is unknown because both a height and weight (above a minimum accepted value) are required for this calculation.     Assessment: 87 YOF on Lovenox and Coumadin for Afib and new CVA.  Patient's renal function is worsening.  His INR trended up significantly to therapeutic level today, but no bleeding reported.  Noted DDI with amiodarone.   Goal of Therapy:  INR 2-3 Monitor platelets by anticoagulation protocol: Yes    Plan:  - Coumadin 3mg  PO today - Daily PT / INR - D/C Lovenox    Paidyn Mcferran D. Laney Potash, PharmD, BCPS Pager:  [phone] 09/14/2013, 2:27 PM

## 2013-09-15 LAB — PROTIME-INR
INR: 2.43 — ABNORMAL HIGH (ref 0.00–1.49)
Prothrombin Time: 25.6 seconds — ABNORMAL HIGH (ref 11.6–15.2)

## 2013-09-15 MED ORDER — WARFARIN SODIUM 5 MG PO TABS
5.0000 mg | ORAL_TABLET | Freq: Once | ORAL | Status: AC
  Administered 2013-09-15: 5 mg via ORAL
  Filled 2013-09-15: qty 1

## 2013-09-15 NOTE — Progress Notes (Signed)
Subjective/Complaints: No specific complaints. Had some incontinence this am again.  Review of Systems - Not accurate secondary to dementia and aphasia  Objective: Vital Signs: Blood pressure 144/53, pulse 51, temperature 98.3 F (36.8 C), temperature source Oral, resp. rate 18, height 5' 4.96" (1.65 m), weight 43.5 kg (95 lb 14.4 oz), SpO2 98.00%. No results found. Results for orders placed during the hospital encounter of 09/05/13 (from the past 72 hour(s))  PROTIME-INR     Status: Abnormal   Collection Time    09/13/13  6:00 AM      Result Value Range   Prothrombin Time 21.9 (*) 11.6 - 15.2 seconds   INR 1.98 (*) 0.00 - 1.49  BASIC METABOLIC PANEL     Status: Abnormal   Collection Time    09/13/13  9:20 AM      Result Value Range   Sodium 136  135 - 145 mEq/L   Potassium 4.1  3.5 - 5.1 mEq/L   Chloride 97  96 - 112 mEq/L   CO2 26  19 - 32 mEq/L   Glucose, Bld 130 (*) 70 - 99 mg/dL   BUN 40 (*) 6 - 23 mg/dL   Creatinine, Ser 4.09 (*) 0.50 - 1.10 mg/dL   Calcium 8.8  8.4 - 81.1 mg/dL   GFR calc non Af Amer 19 (*) >90 mL/min   GFR calc Af Amer 22 (*) >90 mL/min   Comment: (NOTE)     The eGFR has been calculated using the CKD EPI equation.     This calculation has not been validated in all clinical situations.     eGFR's persistently <90 mL/min signify possible Chronic Kidney     Disease.  CBC     Status: Abnormal   Collection Time    09/13/13  9:20 AM      Result Value Range   WBC 7.8  4.0 - 10.5 K/uL   RBC 3.25 (*) 3.87 - 5.11 MIL/uL   Hemoglobin 10.2 (*) 12.0 - 15.0 g/dL   HCT 91.4 (*) 78.2 - 95.6 %   MCV 95.4  78.0 - 100.0 fL   MCH 31.4  26.0 - 34.0 pg   MCHC 32.9  30.0 - 36.0 g/dL   RDW 21.3  08.6 - 57.8 %   Platelets 264  150 - 400 K/uL  PROTIME-INR     Status: Abnormal   Collection Time    09/14/13  7:05 AM      Result Value Range   Prothrombin Time 27.6 (*) 11.6 - 15.2 seconds   INR 2.68 (*) 0.00 - 1.49  PROTIME-INR     Status: Abnormal   Collection Time     09/15/13  7:27 AM      Result Value Range   Prothrombin Time 25.6 (*) 11.6 - 15.2 seconds   INR 2.43 (*) 0.00 - 1.49      Nursing note and vitals reviewed.  Constitutional: She is oriented to person, place, and time. She appears cachectic.  Elderly female  HENT:  Head: Normocephalic and atraumatic.  Panda in right nares  Eyes: Conjunctivae are normal. Pupils are equal, round, and reactive to light.  Neck: Normal range of motion. Neck supple.  Cardiovascular: Normal rate and regular rhythm.  Pulmonary/Chest: Effort normal and breath sounds normal. No respiratory distress.  Abdominal: Soft. Bowel sounds are normal. She exhibits no distension. There is no tenderness.  Musculoskeletal: She exhibits no edema. Right > Left RTC shoulder contracture with poor active abduction, Severe  R hallux valgus Neurological: She is alert and oriented to person, place,not time Speech soft,  with moderately dysarthria. Able to follow simple commands. RUE weakness proximally.  Skin: Skin is warm and dry.  Neuro:  Eyes without evidence of nystagmus  Tone is normal without evidence of spasticity   No evidence of trunkal ataxia  Motor strength is in 3-/5 R deltoid, 5/5 Left deltoid deltoid, 5/5 biceps, triceps, finger flexors and extensors, wrist flexors and extensors, hip flexors, knee flexors and extensors, ankle dorsiflexors, plantar flexors, toe flexors and extensors    Assessment/Plan: 1. Functional deficits secondary to Left embolic MCA infarct which require 3+ hours per day of interdisciplinary therapy in a comprehensive inpatient rehab setting. Physiatrist is providing close team supervision and 24 hour management of active medical problems listed below. Physiatrist and rehab team continue to assess barriers to discharge/monitor patient progress toward functional and medical goals.  FIM: FIM - Bathing Bathing Steps Patient Completed: Chest;Right Arm;Abdomen;Front perineal area;Right upper  leg;Left upper leg;Left Arm Bathing: 3: Mod-Patient completes 5-7 67f 10 parts or 50-74%  FIM - Upper Body Dressing/Undressing Upper body dressing/undressing steps patient completed: Thread/unthread right sleeve of pullover shirt/dresss;Thread/unthread left sleeve of pullover shirt/dress;Put head through opening of pull over shirt/dress;Pull shirt over trunk Upper body dressing/undressing: 5: Set-up assist to: Obtain clothing/put away FIM - Lower Body Dressing/Undressing Lower body dressing/undressing steps patient completed: Thread/unthread right underwear leg;Thread/unthread left underwear leg;Thread/unthread right pants leg;Thread/unthread left pants leg;Pull pants up/down;Pull underwear up/down;Fasten/unfasten pants Lower body dressing/undressing: 4: Min-Patient completed 75 plus % of tasks  FIM - Toileting Toileting steps completed by patient: Adjust clothing prior to toileting;Performs perineal hygiene;Adjust clothing after toileting Toileting Assistive Devices: Grab bar or rail for support Toileting: 5: Supervision: Safety issues/verbal cues  FIM - Diplomatic Services operational officer Devices: Grab bars;Walker Toilet Transfers: 4-To toilet/BSC: Min A (steadying Pt. > 75%);5-From toilet/BSC: Supervision (verbal cues/safety issues)  FIM - Press photographer Assistive Devices: Arm rests;Walker Bed/Chair Transfer: 5: Bed > Chair or W/C: Supervision (verbal cues/safety issues);5: Chair or W/C > Bed: Supervision (verbal cues/safety issues);5: Supine > Sit: Supervision (verbal cues/safety issues);5: Sit > Supine: Supervision (verbal cues/safety issues)  FIM - Locomotion: Wheelchair Distance: 100 Locomotion: Wheelchair: 0: Activity did not occur FIM - Locomotion: Ambulation Locomotion: Ambulation Assistive Devices: Designer, industrial/product Ambulation/Gait Assistance: 5: Supervision Locomotion: Ambulation: 5: Travels 150 ft or more with supervision/safety  issues  Comprehension Comprehension Mode: Auditory Comprehension: 5-Understands basic 90% of the time/requires cueing < 10% of the time  Expression Expression Mode: Verbal Expression: 5-Expresses basic needs/ideas: With extra time/assistive device  Social Interaction Social Interaction: 4-Interacts appropriately 75 - 89% of the time - Needs redirection for appropriate language or to initiate interaction.  Problem Solving Problem Solving: 3-Solves basic 50 - 74% of the time/requires cueing 25 - 49% of the time  Memory Memory: 3-Recognizes or recalls 50 - 74% of the time/requires cueing 25 - 49% of the time  Medical Problem List and Plan:  1. DVT Prophylaxis/Anticoagulation: Pharmaceutical: Coumadin , INR remains low, lovenox until INR>2.0 2. Pain Management: N/A.  3. Mood: No signs of distress. Has supportive family. Will have LCSW follow for evaluation.  4. Neuropsych: This patient is intermittently capable of making decisions on her own behalf.  5. Severe dysphagia: now on po, held Lasix for azotemia, recheck bmet tomorrow   -megace seems to be helping appetite (ate 25-50-95 yesterday) 6. H/o PUD/stomach cancer: add PPI  7. CKD: At Baseline BUN/Cr--40/1.6. Cont sodium  bicarb and rocaltrol.   LOS (Days) 10 A FACE TO FACE EVALUATION WAS PERFORMED  SWARTZ,ZACHARY T 09/15/2013, 9:09 AM

## 2013-09-15 NOTE — Progress Notes (Signed)
ANTICOAGULATION CONSULT NOTE - Follow Up Consult  Pharmacy Consult:  Coumadin Indication: afib and new CVA  No Known Allergies  Patient Measurements: Height: 5' 4.96" (165 cm) Weight: 95 lb 14.4 oz (43.5 kg) IBW/kg (Calculated) : 56.91  Vital Signs: Temp: 98.3 F (36.8 C) (10/05 0443) Temp src: Oral (10/05 0443) BP: 144/53 mmHg (10/05 0443) Pulse Rate: 51 (10/05 0443)  Labs:  Recent Labs  09/13/13 0600 09/13/13 0920 09/14/13 0705 09/15/13 0727  HGB  --  10.2*  --   --   HCT  --  31.0*  --   --   PLT  --  264  --   --   LABPROT 21.9*  --  27.6* 25.6*  INR 1.98*  --  2.68* 2.43*  CREATININE  --  2.20*  --   --     Estimated Creatinine Clearance: 12.8 ml/min (by C-G formula based on Cr of 2.2).     Assessment: 87 YOF on Coumadin for Afib and new CVA.  INR therapeutic today; no bleeding reported.  Noted DDI with amiodarone.   Goal of Therapy:  INR 2-3 Monitor platelets by anticoagulation protocol: Yes    Plan:  - Coumadin 5mg  PO today - Daily PT / INR     Stacey Maura D. Laney Potash, PharmD, BCPS Pager:  [phone] 09/15/2013, 1:05 PM

## 2013-09-16 ENCOUNTER — Inpatient Hospital Stay (HOSPITAL_COMMUNITY): Payer: Medicare Other | Admitting: Rehabilitation

## 2013-09-16 ENCOUNTER — Encounter (HOSPITAL_COMMUNITY): Payer: Self-pay

## 2013-09-16 ENCOUNTER — Inpatient Hospital Stay (HOSPITAL_COMMUNITY): Payer: Medicare Other | Admitting: Speech Pathology

## 2013-09-16 ENCOUNTER — Inpatient Hospital Stay (HOSPITAL_COMMUNITY): Payer: Medicare Other | Admitting: Occupational Therapy

## 2013-09-16 LAB — BASIC METABOLIC PANEL
Calcium: 8.9 mg/dL (ref 8.4–10.5)
Creatinine, Ser: 2.26 mg/dL — ABNORMAL HIGH (ref 0.50–1.10)
GFR calc non Af Amer: 19 mL/min — ABNORMAL LOW (ref >90)
Glucose, Bld: 100 mg/dL — ABNORMAL HIGH (ref 70–99)
Sodium: 136 mEq/L (ref 135–145)

## 2013-09-16 LAB — PROTIME-INR: INR: 1.92 — ABNORMAL HIGH (ref 0.00–1.49)

## 2013-09-16 MED ORDER — WARFARIN SODIUM 5 MG PO TABS
5.0000 mg | ORAL_TABLET | Freq: Once | ORAL | Status: AC
  Administered 2013-09-16: 5 mg via ORAL
  Filled 2013-09-16: qty 1

## 2013-09-16 MED ORDER — ENSURE COMPLETE PO LIQD
237.0000 mL | Freq: Two times a day (BID) | ORAL | Status: DC
  Administered 2013-09-17: 237 mL via ORAL

## 2013-09-16 NOTE — Progress Notes (Signed)
The skilled treatment note has been reviewed and SLP is in agreement. Emmajane Altamura, M.A., CCC-SLP [phone]  

## 2013-09-16 NOTE — Progress Notes (Signed)
Subjective/Complaints: Appetite better  No pain c/o Review of Systems - Not accurate secondary to dementia and aphasia  Objective: Vital Signs: Blood pressure 133/82, pulse 52, temperature 98.3 F (36.8 C), temperature source Oral, resp. rate 17, height 5' 4.96" (1.65 m), weight 45.3 kg (99 lb 13.9 oz), SpO2 98.00%. No results found. Results for orders placed during the hospital encounter of 09/05/13 (from the past 72 hour(s))  BASIC METABOLIC PANEL     Status: Abnormal   Collection Time    09/13/13  9:20 AM      Result Value Range   Sodium 136  135 - 145 mEq/L   Potassium 4.1  3.5 - 5.1 mEq/L   Chloride 97  96 - 112 mEq/L   CO2 26  19 - 32 mEq/L   Glucose, Bld 130 (*) 70 - 99 mg/dL   BUN 40 (*) 6 - 23 mg/dL   Creatinine, Ser 1.61 (*) 0.50 - 1.10 mg/dL   Calcium 8.8  8.4 - 09.6 mg/dL   GFR calc non Af Amer 19 (*) >90 mL/min   GFR calc Af Amer 22 (*) >90 mL/min   Comment: (NOTE)     The eGFR has been calculated using the CKD EPI equation.     This calculation has not been validated in all clinical situations.     eGFR's persistently <90 mL/min signify possible Chronic Kidney     Disease.  CBC     Status: Abnormal   Collection Time    09/13/13  9:20 AM      Result Value Range   WBC 7.8  4.0 - 10.5 K/uL   RBC 3.25 (*) 3.87 - 5.11 MIL/uL   Hemoglobin 10.2 (*) 12.0 - 15.0 g/dL   HCT 04.5 (*) 40.9 - 81.1 %   MCV 95.4  78.0 - 100.0 fL   MCH 31.4  26.0 - 34.0 pg   MCHC 32.9  30.0 - 36.0 g/dL   RDW 91.4  78.2 - 95.6 %   Platelets 264  150 - 400 K/uL  PROTIME-INR     Status: Abnormal   Collection Time    09/14/13  7:05 AM      Result Value Range   Prothrombin Time 27.6 (*) 11.6 - 15.2 seconds   INR 2.68 (*) 0.00 - 1.49  PROTIME-INR     Status: Abnormal   Collection Time    09/15/13  7:27 AM      Result Value Range   Prothrombin Time 25.6 (*) 11.6 - 15.2 seconds   INR 2.43 (*) 0.00 - 1.49      Nursing note and vitals reviewed.  Constitutional: She is oriented to  person, place, and time. She appears cachectic.  Elderly female  HENT:  Head: Normocephalic and atraumatic.  Panda in right nares  Eyes: Conjunctivae are normal. Pupils are equal, round, and reactive to light.  Neck: Normal range of motion. Neck supple.  Cardiovascular: Normal rate and regular rhythm.  Pulmonary/Chest: Effort normal and breath sounds normal. No respiratory distress.  Abdominal: Soft. Bowel sounds are normal. She exhibits no distension. There is no tenderness.  Musculoskeletal: She exhibits no edema. Right > Left RTC shoulder contracture with poor active abduction, Severe R hallux valgus Neurological: She is alert and oriented to person, place,not time Speech soft,  with moderately dysarthria. Able to follow simple commands. RUE weakness proximally.  Skin: Skin is warm and dry.  Neuro:  Eyes without evidence of nystagmus  Tone is normal without evidence of  spasticity   No evidence of trunkal ataxia  Motor strength is in 3-/5 R deltoid, 5/5 Left deltoid deltoid, 5/5 biceps, triceps, finger flexors and extensors, wrist flexors and extensors, hip flexors, knee flexors and extensors, ankle dorsiflexors, plantar flexors, toe flexors and extensors    Assessment/Plan: 1. Functional deficits secondary to Left embolic MCA infarct which require 3+ hours per day of interdisciplinary therapy in a comprehensive inpatient rehab setting. Physiatrist is providing close team supervision and 24 hour management of active medical problems listed below. Physiatrist and rehab team continue to assess barriers to discharge/monitor patient progress toward functional and medical goals. Should be ready for D/C in am FIM: FIM - Bathing Bathing Steps Patient Completed: Chest;Right Arm;Abdomen;Front perineal area;Right upper leg;Left upper leg;Left Arm Bathing: 3: Mod-Patient completes 5-7 1f 10 parts or 50-74%  FIM - Upper Body Dressing/Undressing Upper body dressing/undressing steps patient  completed: Thread/unthread right sleeve of pullover shirt/dresss;Thread/unthread left sleeve of pullover shirt/dress;Put head through opening of pull over shirt/dress;Pull shirt over trunk Upper body dressing/undressing: 5: Set-up assist to: Obtain clothing/put away FIM - Lower Body Dressing/Undressing Lower body dressing/undressing steps patient completed: Thread/unthread right underwear leg;Thread/unthread left underwear leg;Thread/unthread right pants leg;Thread/unthread left pants leg;Pull pants up/down;Pull underwear up/down;Fasten/unfasten pants Lower body dressing/undressing: 4: Min-Patient completed 75 plus % of tasks  FIM - Toileting Toileting steps completed by patient: Adjust clothing prior to toileting;Performs perineal hygiene;Adjust clothing after toileting Toileting Assistive Devices: Grab bar or rail for support Toileting: 5: Supervision: Safety issues/verbal cues  FIM - Diplomatic Services operational officer Devices: Grab bars;Walker Toilet Transfers: 4-To toilet/BSC: Min A (steadying Pt. > 75%);5-From toilet/BSC: Supervision (verbal cues/safety issues)  FIM - Press photographer Assistive Devices: Arm rests;Walker Bed/Chair Transfer: 5: Bed > Chair or W/C: Supervision (verbal cues/safety issues);5: Chair or W/C > Bed: Supervision (verbal cues/safety issues);5: Supine > Sit: Supervision (verbal cues/safety issues);5: Sit > Supine: Supervision (verbal cues/safety issues)  FIM - Locomotion: Wheelchair Distance: 100 Locomotion: Wheelchair: 0: Activity did not occur FIM - Locomotion: Ambulation Locomotion: Ambulation Assistive Devices: Designer, industrial/product Ambulation/Gait Assistance: 5: Supervision Locomotion: Ambulation: 5: Travels 150 ft or more with supervision/safety issues  Comprehension Comprehension Mode: Auditory Comprehension: 5-Understands basic 90% of the time/requires cueing < 10% of the time  Expression Expression Mode: Verbal Expression:  5-Expresses basic needs/ideas: With extra time/assistive device  Social Interaction Social Interaction: 4-Interacts appropriately 75 - 89% of the time - Needs redirection for appropriate language or to initiate interaction.  Problem Solving Problem Solving: 3-Solves basic 50 - 74% of the time/requires cueing 25 - 49% of the time  Memory Memory: 3-Recognizes or recalls 50 - 74% of the time/requires cueing 25 - 49% of the time  Medical Problem List and Plan:  1. DVT Prophylaxis/Anticoagulation: Pharmaceutical: Coumadin , INR remains low, lovenox until INR>2.0 2. Pain Management: N/A.  3. Mood: No signs of distress. Has supportive family. Will have LCSW follow for evaluation.  4. Neuropsych: This patient is intermittently capable of making decisions on her own behalf.  5. Severe dysphagia: now on po, held Lasix for azotemia, recheck bmet tomorrow   -megace seems to be helping appetite  6. H/o PUD/stomach cancer: add PPI  7. CKD: At Baseline BUN/Cr--40/1.6. Cont sodium bicarb and rocaltrol.   LOS (Days) 11 A FACE TO FACE EVALUATION WAS PERFORMED  KIRSTEINS,ANDREW E 09/16/2013, 8:21 AM

## 2013-09-16 NOTE — Progress Notes (Signed)
Speech Language Pathology Daily Session Note  Patient Details  Name: [name] MRN: [id number] Date of Birth: 10-15-1926  Today's Date: 09/16/2013 Time: 1135-1220 Time Calculation (min): 45 min  Short Term Goals: Week 2: SLP Short Term Goal 1 (Week 2): Patient will tolerate Dys.2 textures and thin liquids with Mod verbal cues to carryover 2 swallows per bite/sip with no overt s/s of aspiration. SLP Short Term Goal 2 (Week 2): Patient will perform oral motor and pharyngeal strengtheing exercises with Min verbal cues with external aid. SLP Short Term Goal 3 (Week 2): Patient will increase speech intelligibilty at the phrase level with Min verbal and visual cues to increase vocal intensity and utilize over articulation.   Skilled Therapeutic Interventions: Skilled treatment session focused on addressing dysphagia goals.  SLP facilitated session with patient's lunch tray of Dys.3 textures and thin liquids; patient required Min verbal cues to utilize compensatory strategies, i.e. two swallows per bite/sip.  Patient demonstrated intermittent throat clears which SLP suspects was a result of sensed residue as a result of not utilizing 2 swallows.  Patient also demonstrated reflexive cough x3 during Dys.3 trials which SLP suspects were a result of sensed premature spillage due to increased mastication time.  Patient required Mod verbal cues to clear throat and swallow when wet vocal quality was present.  SLP recommends son and patient to go through education prior to discharge.     FIM:  Comprehension Comprehension Mode: Auditory Comprehension: 5-Follows basic conversation/direction: With extra time/assistive device Expression Expression Mode: Verbal Expression: 5-Expresses basic needs/ideas: With extra time/assistive device Social Interaction Social Interaction: 4-Interacts appropriately 75 - 89% of the time - Needs redirection for appropriate language or to initiate interaction. Problem  Solving Problem Solving: 3-Solves basic 50 - 74% of the time/requires cueing 25 - 49% of the time Memory Memory: 3-Recognizes or recalls 50 - 74% of the time/requires cueing 25 - 49% of the time FIM - Eating Eating Activity: 5: Needs verbal cues/supervision  Pain Pain Assessment Pain Assessment: No/denies pain Pain Score: 0-No pain  Therapy/Group: Individual Therapy  Levora Angel 09/16/2013, 1:24 PM

## 2013-09-16 NOTE — Progress Notes (Signed)
Social Work Patient ID: [name], female   DOB: 1926/10/19, 79 y.o.   MRN: [id number] Message left for son to come in form family education with Speech.  Will need 30 minutes to go over prior to discharge tomorrow. Await son's return call.

## 2013-09-16 NOTE — Progress Notes (Signed)
NUTRITION FOLLOW UP  Intervention:   1.  General healthful diet; encourage intake as needed. 2.  Provide Ensure Complete BID  Nutrition Dx:   Inadequate oral intake, ongoing/improving  Monitor:   1.  Food/Beverage; pt meeting >/=90% estimated needs with tolerance. 2.  Wt/wt change; monitor trends 3.  Enteral nutrition; tolerance if resumed.  Assessment:   79 y.o. RH-female with a history of hypertension, dementia, stomach cancer and peptic ulcer disease, presenting with new onset of inability to speak. MRI/MRA brain done revealing small/patchy left MCA infarct and no stenosis. Swallow evaluation done and NPO recommended. Panda tube was placed for nutrition support.  NG tube has been pulled. Pt on dysphagia 3 diet and reports having a good appetite and eating well. Pt has gained 1 lb in the past week. PO intake varies from 15% to 90% of meals per nursing notes. Pt reports when she eats less than 50% of meals it is because the portions are big or she just gets too full. Encouraged PO intake >75% of meals.   Height: Ht Readings from Last 1 Encounters:  09/14/13 5' 4.96" (1.65 m)    Weight Status:   Wt Readings from Last 1 Encounters:  09/16/13 99 lb 13.9 oz (45.3 kg)    Re-estimated needs:  Kcal: 1330-1520 Protein: 58-66g Fluid: ~1.5 L/day  Skin: pressure ulcer to coccyx  Diet Order: Dysphagia 3, thin   Intake/Output Summary (Last 24 hours) at 09/16/13 1613 Last data filed at 09/16/13 0830  Gross per 24 hour  Intake    240 ml  Output      0 ml  Net    240 ml    Last BM: 10/6   Labs:   Recent Labs Lab 09/10/13 0808 09/13/13 0920 09/16/13 0735  NA 134* 136 136  K 5.0 4.1 4.7  CL 97 97 101  CO2 28 26 24   BUN 40* 40* 48*  CREATININE 1.61* 2.20* 2.26*  CALCIUM 8.6 8.8 8.9  GLUCOSE 143* 130* 100*    CBG (last 3)  No results found for this basename: GLUCAP,  in the last 72 hours  Scheduled Meds: . amiodarone  200 mg Oral Daily  . antiseptic oral rinse   15 mL Mouth Rinse q12n4p  . aspirin EC  81 mg Oral Daily  . atorvastatin  40 mg Oral q1800  . calcitRIOL  0.25 mcg Per Tube Daily  . chlorhexidine  15 mL Mouth Rinse BID  . coumadin book   Does not apply Once  . megestrol  400 mg Oral BID  . pantoprazole sodium  40 mg Per Tube Daily  . sodium bicarbonate  650 mg Per Tube QID  . warfarin  5 mg Oral ONCE-1800  . Warfarin - Pharmacist Dosing Inpatient   Does not apply q1800    Continuous Infusions:   Ian Malkin RD, LDN Inpatient Clinical Dietitian Pager: [phone] After Hours Pager: [phone]

## 2013-09-16 NOTE — Progress Notes (Addendum)
Occupational Therapy Session Note  Patient Details  Name: [name] MRN: [id number] Date of Birth: May 26, 1926  Today's Date: 09/16/2013 Time: 8295-6213 and 0865-7846  Time Calculation (min): 55 min and 27 min  Short Term Goals: Week 2:  OT Short Term Goal 1 (Week 2): STG = LTG due to remaining LOS, supervision overall  Skilled Therapeutic Interventions/Progress Updates:    1) Pt seen for ADL retraining with focus on increased standing tolerance during functional tasks and use of RUE.  Pt finishing breakfast upon arrival and reports dressing with nurse tech already this AM.  Spoke with pt and nurse tech regarding functional level, to which both report supervision level.  Grooming tasks completed in standing with supervision.  Ambulated to therapy gym with supervision with min cues for RW safety when crossing thresholds and proper placement with ambulation.  Engaged in reaching activity with RUE with demonstrating shoulder flexion ~80 degrees.  Retrieved items from laundry room with close supervision.  Pt required rest break during ambulation back to room.   2) Pt seen for 1:1 OT with focus on functional mobility in home environment with RW.  Ambulated in ADL apt with close supervision, min-supervision with toilet transfer.  Engaged in simulated walk-in shower transfer with stepping over 3" shower ledge with HHA.  Pt reports son used to assist her with getting in/out of shower in this manner.  Son did not attend any therapy session for family education, however used to provide min assist/supervision PTA so son should be able to provide necessary level of care.  9 hole peg test Rt: 42 sec and Lt: 42 sec.  Pt returned to room with supervision and min cues for safety with RW.  Therapy Documentation Precautions:  Precautions Precautions: Fall Precaution Comments: Panda in R nare, memory issues (hx of dementia), pt with both urinary and bowel incontinence. Restrictions Weight Bearing Restrictions:  No Pain:  Pt with no c/o pain  See FIM for current functional status  Therapy/Group: Individual Therapy  Rosalio Loud 09/16/2013, 9:26 AM

## 2013-09-16 NOTE — Progress Notes (Signed)
Occupational Therapy Discharge Summary  Patient Details  Name: [name] MRN: [id number] Date of Birth: May 30, 1926  Today's Date: 09/16/2013  Patient has met 10 of 10 long term goals due to improved activity tolerance, improved balance, postural control, functional use of  RIGHT upper extremity and improved awareness.  Patient to discharge at overall Supervision level.  Patient's care partner is independent to provide the necessary physical and cognitive assistance at discharge.  Son did not attend any therapy session for formal family education, however used to provide min assist/supervision PTA so son should be able to provide necessary level of care.  Reasons goals not met: N/A  Recommendation:  Patient will benefit from ongoing skilled OT services in home health setting to continue to advance functional skills in the area of BADL, iADL and Reduce care partner burden.  Equipment: No equipment provided  Reasons for discharge: treatment goals met and discharge from hospital  Patient/family agrees with progress made and goals achieved: Yes  OT Discharge Precautions/Restrictions  Precautions Precautions: Fall Precaution Comments: memory issues (hx of dementia), pt with both urinary and bowel incontinence. Restrictions Weight Bearing Restrictions: No Pain Pain Assessment Pain Assessment: No/denies pain Pain Score: 0-No pain ADL   See FIM Vision/Perception  Vision - History Baseline Vision: No visual deficits Patient Visual Report: No change from baseline Vision - Assessment Eye Alignment: Within Functional Limits Perception Perception: Within Functional Limits  Cognition Overall Cognitive Status: History of cognitive impairments - at baseline Arousal/Alertness: Awake/alert Orientation Level: Oriented to person;Oriented to place;Oriented to time;Oriented to situation Attention: Selective Memory: Impaired Memory Impairment: Decreased short term memory;Decreased recall of  new information Awareness: Impaired Safety/Judgment: Impaired Comments: Continue to don quick release belt for memory issues  Sensation Sensation Light Touch: Appears Intact Stereognosis: Not tested Hot/Cold: Not tested Proprioception: Appears Intact Coordination Gross Motor Movements are Fluid and Coordinated: No Fine Motor Movements are Fluid and Coordinated: No Heel Shin Test: Note pt under shot heel to shin on BLEs, however was able to finally get R heel to L shin with max cues and demonstration.   9 Hole Peg Test: Rt: 42 sec Lt: 42 sec Motor  Motor Motor: Abnormal postural alignment and control Motor - Skilled Clinical Observations: Continue to note some slight RLE weakness, however has improved since time of evaluation Mobility  Bed Mobility Bed Mobility: Supine to Sit;Sit to Supine Supine to Sit: 5: Supervision Sit to Supine: 5: Supervision Transfers Sit to Stand: 5: Supervision Stand to Sit: 5: Supervision  Trunk/Postural Assessment  Postural Control Postural Limitations: Pt continues to demonstrate forward head, rounded shoulders and flexed posture in standing.   Balance   Extremity/Trunk Assessment RUE Assessment RUE Assessment: Exceptions to [facility] RUE AROM (degrees) RUE Overall AROM Comments: shoulder flexxion approx 80 degrees, poor abduction RUE PROM (degrees) RUE Overall PROM Comments: WFL, tolerates full ROM RUE Strength RUE Overall Strength Comments: 3-/5 deltoid LUE Assessment LUE Assessment: Within Functional Limits  See FIM for current functional status  Gaspar Fowle, [facility] 09/16/2013, 2:50 PM

## 2013-09-16 NOTE — Progress Notes (Signed)
ANTICOAGULATION CONSULT NOTE - Follow Up Consult  Pharmacy Consult for Coumadin Indication: atrial fibrillation  No Known Allergies  Patient Measurements: Height: 5' 4.96" (165 cm) Weight: 99 lb 13.9 oz (45.3 kg) IBW/kg (Calculated) : 56.91 Heparin Dosing Weight:   Vital Signs: Temp: 98.3 F (36.8 C) (10/06 0443) Temp src: Oral (10/06 0443) BP: 133/82 mmHg (10/06 0443) Pulse Rate: 52 (10/06 0443)  Labs:  Recent Labs  09/13/13 0920 09/14/13 0705 09/15/13 0727 09/16/13 0735  HGB 10.2*  --   --   --   HCT 31.0*  --   --   --   PLT 264  --   --   --   LABPROT  --  27.6* 25.6* 21.4*  INR  --  2.68* 2.43* 1.92*  CREATININE 2.20*  --   --  2.26*    Estimated Creatinine Clearance: 13 ml/min (by C-G formula based on Cr of 2.26).   Medications:  Scheduled:  . amiodarone  200 mg Oral Daily  . antiseptic oral rinse  15 mL Mouth Rinse q12n4p  . aspirin EC  81 mg Oral Daily  . atorvastatin  40 mg Oral q1800  . calcitRIOL  0.25 mcg Per Tube Daily  . chlorhexidine  15 mL Mouth Rinse BID  . coumadin book   Does not apply Once  . megestrol  400 mg Oral BID  . pantoprazole sodium  40 mg Per Tube Daily  . sodium bicarbonate  650 mg Per Tube QID  . Warfarin - Pharmacist Dosing Inpatient   Does not apply q1800    Assessment: 79yo female with new onset AFib and stroke, receiving Amiodarone which may increase Coumadin effect.  INR 1.92 this AM, has been very up/down with all doses charted.  No bleeding problems noted.    Goal of Therapy:  INR 2-3 Monitor platelets by anticoagulation protocol: Yes   Plan:  1.  Coumadin 5mg  today 2.  F/U in AM  Marisue Humble, PharmD Clinical Pharmacist [facility]

## 2013-09-16 NOTE — Progress Notes (Signed)
Social Work Patient ID: [name], female   DOB: January 01, 1926, 79 y.o.   MRN: [id number] Spoke with Campbell Lerner who can be here tomorrow at 10;00 to go through speech with pt.  He is aware pt still going home tomorrow.

## 2013-09-16 NOTE — Progress Notes (Signed)
Physical Therapy Session Note  Patient Details  Name: [name] MRN: [id number] Date of Birth: Jan 21, 1926  Today's Date: 09/16/2013 Time: 1015-1109 Time Calculation (min): 54 min  Short Term Goals: Week 2:     Skilled Therapeutic Interventions/Progress Updates:   Pt received sitting in w/c and agreeable to therapy this afternoon.  Pt ambulated >150' x 2 to/from gym (ortho gym) at supervision level with use of RW with min cues for upright posture, increasing stride length and maintaining position inside of RW.  Pt also performed gait training in ADL apt to better simulate home environment x 50' with RW at supervision level.  Provided same cues mentioned above.  Performed car transfer x 1 rep at supervision level with min cues for safety and technique with RW.  Performed BERG balance test with new score of 31/56, which continues to place pt at significant fall risk.  See details below.  Performed bed mobility in ADL apt to better simulate home environment at supervision level for safety with HOB flat and without use of handrails.  Performed gait training up/down ramp x 2 reps to simulate home entry at supervision with cues for upright posture when descending ramp and maintaining position inside of RW when making small turns.  Performed 4" and 6" steps (5 total) with use of B handrails at supervision level with cues for step to pattern for safety.  Pt returned to room in w/c, applied quick release belt with all needs in reach.   Therapy Documentation Precautions:  Precautions Precautions: Fall Precaution Comments: Panda in R nare, memory issues (hx of dementia), pt with both urinary and bowel incontinence. Restrictions Weight Bearing Restrictions: No   Pain: Pain Assessment Pain Assessment: No/denies pain Pain Score: 0-No pain Mobility: Bed Mobility Bed Mobility: Supine to Sit;Sit to Supine Supine to Sit: 5: Supervision Sit to Supine: 5: Supervision Transfers Transfers: Yes Sit to  Stand: 5: Supervision Stand to Sit: 5: Supervision Stand Pivot Transfers: 5: Supervision Locomotion : Ambulation Ambulation: Yes Ambulation/Gait Assistance: 5: Supervision Ambulation Distance (Feet): 160 Feet Assistive device: Rolling walker Gait Gait: Yes Gait Pattern: Impaired Gait Pattern: Step-through pattern;Narrow base of support;Trunk flexed;Right flexed knee in stance;Left flexed knee in stance Stairs / Additional Locomotion Stairs: Yes Stairs Assistance: 5: Supervision Stair Management Technique: Step to pattern;Two rails;Forwards Number of Stairs: 5 Height of Stairs: 4 (and 6") Ramp: 5: Supervision  Trunk/Postural Assessment : Postural Control Postural Limitations: Pt continues to demonstrate forward head, rounded shoulders and flexed posture in standing.   Balance: Standardized Balance Assessment Standardized Balance Assessment: Berg Balance Test Berg Balance Test Sit to Stand: Able to stand  independently using hands Standing Unsupported: Able to stand 2 minutes with supervision Sitting with Back Unsupported but Feet Supported on Floor or Stool: Able to sit safely and securely 2 minutes Stand to Sit: Sits safely with minimal use of hands Transfers: Able to transfer safely, definite need of hands Standing Unsupported with Eyes Closed: Able to stand 10 seconds with supervision Standing Ubsupported with Feet Together: Needs help to attain position but able to stand for 30 seconds with feet together From Standing, Reach Forward with Outstretched Arm: Reaches forward but needs supervision From Standing Position, Pick up Object from Floor: Able to pick up shoe, needs supervision From Standing Position, Turn to Look Behind Over each Shoulder: Turn sideways only but maintains balance Turn 360 Degrees: Needs assistance while turning Standing Unsupported, Alternately Place Feet on Step/Stool: Able to complete >2 steps/needs minimal assist Standing Unsupported,  One Foot in  Front: Able to take small step independently and hold 30 seconds Standing on One Leg: Tries to lift leg/unable to hold 3 seconds but remains standing independently Total Score: 31  See FIM for current functional status  Therapy/Group: Individual Therapy  Vista Deck 09/16/2013, 12:36 PM

## 2013-09-17 ENCOUNTER — Inpatient Hospital Stay (HOSPITAL_COMMUNITY): Payer: Medicare Other | Admitting: Speech Pathology

## 2013-09-17 DIAGNOSIS — I69920 Aphasia following unspecified cerebrovascular disease: Secondary | ICD-10-CM

## 2013-09-17 DIAGNOSIS — G8110 Spastic hemiplegia affecting unspecified side: Secondary | ICD-10-CM

## 2013-09-17 DIAGNOSIS — I6340 Cerebral infarction due to embolism of unspecified cerebral artery: Secondary | ICD-10-CM

## 2013-09-17 LAB — PROTIME-INR
INR: 2.16 — ABNORMAL HIGH (ref 0.00–1.49)
Prothrombin Time: 23.4 seconds — ABNORMAL HIGH (ref 11.6–15.2)

## 2013-09-17 LAB — BASIC METABOLIC PANEL
BUN: 47 mg/dL — ABNORMAL HIGH (ref 6–23)
CO2: 21 mEq/L (ref 19–32)
Calcium: 7.9 mg/dL — ABNORMAL LOW (ref 8.4–10.5)
Creatinine, Ser: 2.16 mg/dL — ABNORMAL HIGH (ref 0.50–1.10)
GFR calc non Af Amer: 20 mL/min — ABNORMAL LOW (ref >90)
Glucose, Bld: 91 mg/dL (ref 70–99)

## 2013-09-17 MED ORDER — SODIUM CHLORIDE 0.45 % IV BOLUS
1000.0000 mL | Freq: Once | INTRAVENOUS | Status: AC
  Administered 2013-09-17: 1000 mL via INTRAVENOUS

## 2013-09-17 MED ORDER — ACETAMINOPHEN 325 MG PO TABS: 325.0000 mg | ORAL_TABLET | ORAL | Status: DC | PRN

## 2013-09-17 MED ORDER — WARFARIN SODIUM 5 MG PO TABS: 5.0000 mg | ORAL_TABLET | Freq: Every day | ORAL | Status: DC

## 2013-09-17 MED ORDER — ATORVASTATIN CALCIUM 40 MG PO TABS: 40.0000 mg | ORAL_TABLET | Freq: Every day | ORAL | Status: DC

## 2013-09-17 MED ORDER — SODIUM BICARBONATE 650 MG PO TABS: 650.0000 mg | ORAL_TABLET | Freq: Four times a day (QID) | ORAL | Status: DC

## 2013-09-17 MED ORDER — WARFARIN SODIUM 5 MG PO TABS
5.0000 mg | ORAL_TABLET | Freq: Once | ORAL | Status: DC
  Filled 2013-09-17: qty 1

## 2013-09-17 MED ORDER — AMIODARONE HCL 200 MG PO TABS: 200.0000 mg | ORAL_TABLET | Freq: Every day | ORAL | Status: DC

## 2013-09-17 NOTE — Plan of Care (Signed)
Problem: RH KNOWLEDGE DEFICIT Goal: RH STG INCREASE KNOWLEDGE OF DYSPHAGIA/FLUID INTAKE Outcome: Completed/Met Date Met:  09/17/13 Family education on diet and dysphagia/fluid intake with speech therapy.

## 2013-09-17 NOTE — Discharge Summary (Addendum)
Physician Discharge Summary  Patient ID: [name] MRN: [id number] DOB/AGE: [id number] 77 y.o.  Admit date: 09/05/2013 Discharge date: 09/17/2013  Discharge Diagnoses:  Principal Problem: Stroke, acute, embolic  Active Problems:   Dementia   Hypertension   Atrial fibrillation     Dysphagia, unspecified(787.20)   Dysarthria   Discharged Condition: Improved  Significant Diagnostic Studies:  Dg Abd 1 View  09/03/2013   CLINICAL DATA:  Placement of nasogastric tube at the bedside by the radiologic technologist.  EXAM: ABDOMEN - 1 VIEW; DG NASO G TUBE PLC W/FL-NO RAD  COMPARISON:  None.  FINDINGS: AP image of the upper abdomen demonstrates a feeding tube which has its tip in the proximal 4th portion of the duodenum. This was confirmed with a hand injection of 40 cc Omnipaque 300 by the radiologic technologist. The technologist documented 3 minutes and 21 seconds of fluoroscopy time.  IMPRESSION: Feeding tube tip in the proximal 4th portion of the duodenum.   Electronically Signed   By: Hulan Saas   On: 09/03/2013 17:44      Labs:  Basic Metabolic Panel:  Recent Labs Lab 09/13/13 0920 09/16/13 0735  NA 136 136  K 4.1 4.7  CL 97 101  CO2 26 24  GLUCOSE 130* 100*  BUN 40* 48*  CREATININE 2.20* 2.26*  CALCIUM 8.8 8.9    CBC:  Recent Labs Lab 09/13/13 0920  WBC 7.8  HGB 10.2*  HCT 31.0*  MCV 95.4  PLT 264    CBG:  Recent Labs Lab 09/10/13 1153 09/10/13 1846  GLUCAP 115* 161*    Brief HPI:   [name] is a 79 y.o. RH-female with a history of hypertension, dementia, stomach cancer and peptic ulcer disease, presenting with new onset of inability to speak. Her ability to speak improved markedly while being evaluated in the emergency room. MRI/MRA brain done revealing small/patchy left MCA infarct and no stenosis. 2D echo with EF 65-70% and grade 1 diastolic dysfunction. Neurology recommends ASA and Plavix for thrombotic infarct due to SVD. Swallow  evaluation done and NPO recommended. Dr. Johney Frame following for input and recommended amiodarone for treatment of A fib as well as coumadin for secondary stroke prevention. Therapies initiated and CIR recommended by therapy team.    Hospital Course: [name] was admitted to rehab 09/05/2013 for inpatient therapies to consist of PT, ST and OT at least three hours five days a week. Past admission physiatrist, therapy team and rehab RN have worked together to provide customized collaborative inpatient rehab. Blood pressures have been well controlled. Amiodarone was decreased due to bradycardia.  She is tolerating coumadin without side effects. As dysphagia improved, panda was removed and she was started on dysphagia 2 with thin liquids. Po intake is reasonable and son was advised to push fluids at home. Megace was discontinued at discharge. Routine check of lytes were done showing acute on CKD therefore she was treated with  IVF prior to discharge. She has made good progress during rehab course and is at min assist to supervision level.   Family education was done with son regarding need for 24 hours supervision due to safety concerns.  [facility] to provided HHPT, OT, ST and RN past discharge.   Rehab course: During patient's stay in rehab weekly team conferences were held to monitor patient's progress, set goals and discuss barriers to discharge. Patient has had improvement in activity tolerance, balance, postural control, as well as improvement in functional use RUE  and RLE with improved awareness. She requires min assist to supervision with bathing and dressing tasks. She is ambulating at supervision level with use of a walker. She requires supervision with cues for safety with diet and requires min verbal cues to utilize speech intelligibility strategies.    Disposition: Home  Diet: Chopped foods--low salt.  Thin liquids. Needs supervision and minimal distractions at meals.   Special  Instructions: 1. Apply zinc oxide or emollient cream to sacrum 1-2 times a day.  2. Drink at least 5 glasses of water daily.  3. Resume Calcitriol 0.25 mcg daily, vitamin B, iron and multivitamin.  4. Do not use Namenda and Aricept for now.  5.  Pressure relief measures--avoid sitting in one place for prolonged periods        Future Appointments Provider Department Dept Phone   09/17/2013 10:00 AM Ophelia Shoulder, CCC-SLP [facility] A [phone]   09/26/2013 11:00 AM Hillis Range, MD [facility] Office [phone]   09/30/2013 10:45 AM Erick Colace, MD [facility] [phone]       Medication List    STOP taking these medications       aspirin 325 MG tablet     chlorhexidine 0.12 % solution  Commonly known as:  PERIDEX     feeding supplement (JEVITY 1.2 CAL) Liqd     free water Soln     heparin 5000 UNIT/ML injection     hydrALAZINE 20 MG/ML injection  Commonly known as:  APRESOLINE     hydrochlorothiazide 10 mg/mL Susp      TAKE these medications       acetaminophen 325 MG tablet  Commonly known as:  TYLENOL  Take 1 tablet (325 mg total) by mouth every 4 (four) hours as needed.     amiodarone 200 MG tablet  Commonly known as:  PACERONE  Take 1 tablet (200 mg total) by mouth daily.     antiseptic oral rinse Liqd  15 mLs by Mouth Rinse route 2 times daily at 12 noon and 4 pm.     atorvastatin 40 MG tablet  Commonly known as:  LIPITOR  Take 1 tablet (40 mg total) by mouth daily at 6 PM.     senna-docusate 8.6-50 MG per tablet  Commonly known as:  Senokot-S  Take 1 tablet by mouth at bedtime as needed.     sodium bicarbonate 650 MG tablet  Place 1 tablet (650 mg total) into feeding tube 4 (four) times daily.     warfarin 5 MG tablet  Commonly known as:  COUMADIN  Take 1 tablet (5 mg total) by mouth daily. Take in evenings with supper       Follow-up Information   Follow up  with Erick Colace, MD On 09/30/2013. (Be there at 10;15 am  for  10:45 am   appointment)    Specialty:  Physical Medicine and Rehabilitation   Contact information:   [address] [place] [address] [phone]       Follow up with Gates Rigg, MD. Call today. (for follow up in 6 weeks. )    Specialties:  Neurology, Radiology   Contact information:   [address] [place] [address] [phone]       Follow up with Elvina Sidle, MD. (Need to follow up for post hospital check: times available 10/20 5;00-8;30pm or 10/22 8;30am-2;00pm-Walk-in only)    Specialty:  Family Medicine  Contact information:   [address] [place] [address] [phone]       Follow up with Hillis Range, MD On 09/26/2013. (Be there at 10:45 for 11  am appointment)    Specialty:  Cardiology   Contact information:   [address] [place] [address] [phone]       Follow up with DETERDING,JAMES L, MD. Call today. (for follow up in 2 weeks. )    Specialty:  Nephrology   Contact information:   [address] [facility] [place] [address] [phone]       Signed: Jacquelynn Cree 09/17/2013, 8:47 AM

## 2013-09-17 NOTE — Progress Notes (Signed)
Patient discharge to home with son at 43.  Discharge instruction provided by Delle Reining, PA.  Patient/son verbalized understanding, no further question ask.  Patient escorted off unit by rehab NT.

## 2013-09-17 NOTE — Progress Notes (Signed)
Subjective/Complaints: No c/o, po caloric intake ~50%, fluid intake yest Review of Systems - Not accurate secondary to dementia and aphasia  Objective: Vital Signs: Blood pressure 117/69, pulse 49, temperature 97.9 F (36.6 C), temperature source Oral, resp. rate 16, height 5' 4.96" (1.65 m), weight 44.5 kg (98 lb 1.7 oz), SpO2 96.00%. No results found. Results for orders placed during the hospital encounter of 09/05/13 (from the past 72 hour(s))  PROTIME-INR     Status: Abnormal   Collection Time    09/15/13  7:27 AM      Result Value Range   Prothrombin Time 25.6 (*) 11.6 - 15.2 seconds   INR 2.43 (*) 0.00 - 1.49  PROTIME-INR     Status: Abnormal   Collection Time    09/16/13  7:35 AM      Result Value Range   Prothrombin Time 21.4 (*) 11.6 - 15.2 seconds   INR 1.92 (*) 0.00 - 1.49  BASIC METABOLIC PANEL     Status: Abnormal   Collection Time    09/16/13  7:35 AM      Result Value Range   Sodium 136  135 - 145 mEq/L   Potassium 4.7  3.5 - 5.1 mEq/L   Chloride 101  96 - 112 mEq/L   CO2 24  19 - 32 mEq/L   Glucose, Bld 100 (*) 70 - 99 mg/dL   BUN 48 (*) 6 - 23 mg/dL   Creatinine, Ser 4.09 (*) 0.50 - 1.10 mg/dL   Calcium 8.9  8.4 - 81.1 mg/dL   GFR calc non Af Amer 19 (*) >90 mL/min   GFR calc Af Amer 22 (*) >90 mL/min   Comment: (NOTE)     The eGFR has been calculated using the CKD EPI equation.     This calculation has not been validated in all clinical situations.     eGFR's persistently <90 mL/min signify possible Chronic Kidney     Disease.      Nursing note and vitals reviewed.  Constitutional: She is oriented to person, place, and time. She appears cachectic.  Elderly female  HENT:  Head: Normocephalic and atraumatic.  Panda in right nares  Eyes: Conjunctivae are normal. Pupils are equal, round, and reactive to light.  Neck: Normal range of motion. Neck supple.  Cardiovascular: Normal rate and regular rhythm.  Pulmonary/Chest: Effort normal and breath  sounds normal. No respiratory distress.  Abdominal: Soft. Bowel sounds are normal. She exhibits no distension. There is no tenderness.  Musculoskeletal: She exhibits no edema. Right > Left RTC shoulder contracture with poor active abduction, Severe R hallux valgus Neurological: She is alert and oriented to person, place,not time Speech soft,  with moderately dysarthria. Able to follow simple commands. RUE weakness proximally.  Skin: Skin is warm and dry.  Neuro:  Eyes without evidence of nystagmus  Tone is normal without evidence of spasticity   No evidence of trunkal ataxia  Motor strength is in 3-/5 R deltoid, 5/5 Left deltoid deltoid, 5/5 biceps, triceps, finger flexors and extensors, wrist flexors and extensors, hip flexors, knee flexors and extensors, ankle dorsiflexors, plantar flexors, toe flexors and extensors    Assessment/Plan: 1. Functional deficits secondary to Left embolic MCA infarct which require 3+ hours per day of interdisciplinary therapy in a comprehensive inpatient rehab setting. Physiatrist is providing close team supervision and 24 hour management of active medical problems listed below. Physiatrist and rehab team continue to assess barriers to discharge/monitor patient progress toward functional and  medical goals. Needs 1 litre fluid bolus prior to D/C FIM: FIM - Bathing Bathing Steps Patient Completed: Chest;Right Arm;Abdomen;Front perineal area;Right upper leg;Left upper leg;Left Arm;Buttocks Bathing: 0: Activity did not occur  FIM - Upper Body Dressing/Undressing Upper body dressing/undressing steps patient completed: Thread/unthread right sleeve of pullover shirt/dresss;Thread/unthread left sleeve of pullover shirt/dress;Put head through opening of pull over shirt/dress;Pull shirt over trunk Upper body dressing/undressing: 0: Activity did not occur FIM - Lower Body Dressing/Undressing Lower body dressing/undressing steps patient completed: Thread/unthread right  underwear leg;Thread/unthread left underwear leg;Thread/unthread right pants leg;Thread/unthread left pants leg;Pull pants up/down;Pull underwear up/down;Fasten/unfasten pants;Don/Doff right sock;Don/Doff left sock Lower body dressing/undressing: 0: Activity did not occur  FIM - Toileting Toileting steps completed by patient: Adjust clothing prior to toileting;Performs perineal hygiene;Adjust clothing after toileting Toileting Assistive Devices: Grab bar or rail for support Toileting: 0: Activity did not occur  FIM - Diplomatic Services operational officer Devices: Grab bars;Walker Toilet Transfers: 0-Activity did not occur  FIM - Banker Devices: Arm rests;Walker Bed/Chair Transfer: 5: Supine > Sit: Supervision (verbal cues/safety issues);5: Sit > Supine: Supervision (verbal cues/safety issues);5: Bed > Chair or W/C: Supervision (verbal cues/safety issues);5: Chair or W/C > Bed: Supervision (verbal cues/safety issues)  FIM - Locomotion: Wheelchair Distance: 100 Locomotion: Wheelchair: 0: Activity did not occur FIM - Locomotion: Ambulation Locomotion: Ambulation Assistive Devices: Designer, industrial/product Ambulation/Gait Assistance: 5: Supervision Locomotion: Ambulation: 0: Activity did not occur  Comprehension Comprehension Mode: Auditory Comprehension: 5-Follows basic conversation/direction: With extra time/assistive device  Expression Expression Mode: Verbal Expression: 5-Expresses basic needs/ideas: With extra time/assistive device  Social Interaction Social Interaction: 4-Interacts appropriately 75 - 89% of the time - Needs redirection for appropriate language or to initiate interaction.  Problem Solving Problem Solving: 3-Solves basic 50 - 74% of the time/requires cueing 25 - 49% of the time  Memory Memory: 3-Recognizes or recalls 50 - 74% of the time/requires cueing 25 - 49% of the time  Medical Problem List and Plan:  1. DVT  Prophylaxis/Anticoagulation: Pharmaceutical: Coumadin , INR remains low, lovenox until INR>2.0 2. Pain Management: N/A.  3. Mood: No signs of distress. Has supportive family. Will have LCSW follow for evaluation.  4. Neuropsych: This patient is intermittently capable of making decisions on her own behalf.  5. Severe dysphagia: now on po, held Lasix for azotemia, recheck bmet tomorrow   -megace seems to be helping appetite  6. H/o PUD/stomach cancer: add PPI  7. CKD: At Baseline BUN/Cr--40/1.6.No 48/2.2 fluid bolus, no current nephrotoxic meds.   LOS (Days) 12 A FACE TO FACE EVALUATION WAS PERFORMED  Coye Dawood E 09/17/2013, 8:10 AM

## 2013-09-17 NOTE — Progress Notes (Signed)
Social Work Discharge Note Discharge Note  The overall goal for the admission was met for:   Discharge location: Yes-HOME WITH SON WHO CAN PROVIDE 24 HR SUPERVISION LEVEL  Length of Stay: Yes-12 DAYS  Discharge activity level: Yes-SUPERVISION LEVEL  Home/community participation: Yes  Services provided included: MD, RD, PT, OT, SLP, RN, Pharmacy and SW  Financial Services: Medicare and Private Insurance: BCBS  Follow-up services arranged: Home Health: [facility]-PT,RN,SPT and Patient/Family request agency HH: [facility], DME: NO NEEDS  Comments (or additional information):FAMILY EDUCATION COMPLETED, BOTH COMFORTABLE WITH HER CARE  Patient/Family verbalized understanding of follow-up arrangements: Yes  Individual responsible for coordination of the follow-up plan: BRAD-SON  Confirmed correct DME delivered: Lucy Chris 09/17/2013    Lucy Chris

## 2013-09-17 NOTE — Progress Notes (Signed)
ANTICOAGULATION CONSULT NOTE - Follow Up Consult  Pharmacy Consult for Couamdin Indication: atrial fibrillation  No Known Allergies  Patient Measurements: Height: 5' 4.96" (165 cm) Weight: 98 lb 1.7 oz (44.5 kg) IBW/kg (Calculated) : 56.91 Heparin Dosing Weight:   Vital Signs: Temp: 97.9 F (36.6 C) (10/07 0531) Temp src: Oral (10/07 0531) BP: 117/69 mmHg (10/07 0531) Pulse Rate: 49 (10/07 0531)  Labs:  Recent Labs  09/15/13 0727 09/16/13 0735 09/17/13 0835 09/17/13 1210  LABPROT 25.6* 21.4* 23.4*  --   INR 2.43* 1.92* 2.16*  --   CREATININE  --  2.26*  --  2.16*    Estimated Creatinine Clearance: 13.4 ml/min (by C-G formula based on Cr of 2.16).   Medications:  Scheduled:  . amiodarone  200 mg Oral Daily  . antiseptic oral rinse  15 mL Mouth Rinse q12n4p  . aspirin EC  81 mg Oral Daily  . atorvastatin  40 mg Oral q1800  . calcitRIOL  0.25 mcg Per Tube Daily  . chlorhexidine  15 mL Mouth Rinse BID  . coumadin book   Does not apply Once  . feeding supplement (ENSURE COMPLETE)  237 mL Oral BID BM  . megestrol  400 mg Oral BID  . pantoprazole sodium  40 mg Per Tube Daily  . sodium bicarbonate  650 mg Per Tube QID  . Warfarin - Pharmacist Dosing Inpatient   Does not apply q1800    Assessment: 79yo female with new onset AFib and new stroke.  INR therapeutic this AM, at 2.16.  No bleeding problems noted.  SHe is receiving Amiodarone which may incr Coumadin effect.  Goal of Therapy:  INR 2-3 Monitor platelets by anticoagulation protocol: Yes   Plan:  1.  Coumadin 5mg  2.  F/U in AM  Marisue Humble, PharmD Clinical Pharmacist [facility]

## 2013-09-17 NOTE — Progress Notes (Signed)
The skilled treatment note and discharge summary have been reviewed and SLP is in agreement. Vash Quezada, M.A., CCC-SLP [phone]  

## 2013-09-17 NOTE — Progress Notes (Signed)
Speech Language Pathology Daily Session and Discharge Note  Patient Details  Name: [name] MRN: [id number] Date of Birth: 02-Jun-1926  Today's Date: 09/17/2013 Time: 1010-1025 Time Calculation (min): 15 min  Short Term Goals: Week 2: SLP Short Term Goal 1 (Week 2): Patient will tolerate Dys.2 textures and thin liquids with Mod verbal cues to carryover 2 swallows per bite/sip with no overt s/s of aspiration. SLP Short Term Goal 2 (Week 2): Patient will perform oral motor and pharyngeal strengtheing exercises with Min verbal cues with external aid. SLP Short Term Goal 3 (Week 2): Patient will increase speech intelligibilty at the phrase level with Min verbal and visual cues to increase vocal intensity and utilize over articulation.   Skilled Therapeutic Interventions: Skilled treatment session focused on family education prior to patient's discharge.  SLP facilitated session with discussion regarding patient's current diet and safe swallow procedures, aspiration risks, and follow up procedures.  A handout was given to patient's son with strategies listed and list of foods on patient's current diet.  Patient' son verbalized understanding.  Patient also verbalized understanding; patient required Min verbal cues to utilize speech intelligibility strategies.  Speech therapy goals met, ready for discharge.     FIM:  Comprehension Comprehension Mode: Auditory Comprehension: 5-Follows basic conversation/direction: With extra time/assistive device Expression Expression Mode: Verbal Expression: 5-Expresses basic needs/ideas: With extra time/assistive device Social Interaction Social Interaction: 4-Interacts appropriately 75 - 89% of the time - Needs redirection for appropriate language or to initiate interaction. Problem Solving Problem Solving: 3-Solves basic 50 - 74% of the time/requires cueing 25 - 49% of the time Memory Memory: 3-Recognizes or recalls 50 - 74% of the time/requires cueing 25 -  49% of the time FIM - Eating Eating Activity: 0: Activity did not occur  Pain Pain Assessment Pain Assessment: No/denies pain Pain Score: 0-No pain  Therapy/Group: Individual Therapy   Speech Language Pathology Discharge Summary  Patient Details  Name: [name] MRN: [id number] Date of Birth: [birth year]  Today's Date: 09/17/2013  Patient has met 4 of 4 long term goals.  Patient to discharge at overall Min;Mod level.  Reasons goals not met: N/A   Clinical Impression/Discharge Summary: Patient met 4 out of 4 long term goals.  Patient made gains from an NPO diet to toleration of current diet of Dys.3 textures and thin liquids with help with set up and Moderate cueing to utilize safe swallow strategies.  Patient overall demonstrated a functional change in her swallow as documented by her toleration of current diet.  Patient also participated in dysphagia therapy and can perform oral motor exercises with Min cueing with the use of an external aid.  Patient also increased speech intelligibility and can utilize strategies with Min verbal cueing at the sentence level.  Family and patient education completed, and patient is ready for discharge home with son.  Given patient's continued dysphagia, it is recommended that patient continue to receive dysphagia services to maximize safety during meals and reduce burden of care at home.  Care Partner:  Caregiver Able to Provide Assistance: Yes  Type of Caregiver Assistance: Cognitive;Physical  Recommendation:  24 hour supervision/assistance;Home Health SLP  Rationale for SLP Follow Up: Reduce caregiver burden;Maximize swallowing safety    Reasons for discharge: Discharged from hospital   Patient/Family Agrees with Progress Made and Goals Achieved: Yes   See FIM for current functional status  [name] 09/17/2013, 11:14 AM

## 2013-09-17 NOTE — Progress Notes (Signed)
Physical Therapy Discharge Summary  Patient Details  Name: [name] MRN: [id number] Date of Birth: Sep 28, 1926  Today's Date: 09/17/2013  Patient has met 9 of 9 long term goals due to improved activity tolerance, improved balance, improved postural control, increased strength, ability to compensate for deficits, functional use of  right upper extremity and right lower extremity and improved coordination.  Patient to discharge at an ambulatory level Supervision with RW for household and community ambulation.   Patient's care partner, son, is independent to provide the necessary physical, cognitive and supervision assistance at discharge.  Reasons goals not met: All goals met  Recommendation:  Patient will benefit from ongoing skilled PT services in home health setting to continue to advance safe functional mobility, address ongoing impairments in R UE and LE strength, impaired activity tolerance, impaired postural control, dynamic balance, gait, and minimize fall risk secondary to pt continued high falls risk as indicated by BERG balance score of 31/56 at D/C (improvement from 28/56).    Equipment: No equipment provided  Reasons for discharge: treatment goals met and discharge from hospital  Patient/family agrees with progress made and goals achieved: Yes   See FIM for current functional status  [name] [facility] 09/17/2013, 9:23 AM

## 2013-09-19 ENCOUNTER — Ambulatory Visit (INDEPENDENT_AMBULATORY_CARE_PROVIDER_SITE_OTHER): Payer: Medicare Other | Admitting: Pharmacist

## 2013-09-19 DIAGNOSIS — I6340 Cerebral infarction due to embolism of unspecified cerebral artery: Secondary | ICD-10-CM

## 2013-09-19 DIAGNOSIS — Z7901 Long term (current) use of anticoagulants: Secondary | ICD-10-CM

## 2013-09-19 DIAGNOSIS — I639 Cerebral infarction, unspecified: Secondary | ICD-10-CM

## 2013-09-19 DIAGNOSIS — I4891 Unspecified atrial fibrillation: Secondary | ICD-10-CM

## 2013-09-19 LAB — POCT INR: INR: 3.4

## 2013-09-24 ENCOUNTER — Telehealth: Payer: Self-pay

## 2013-09-24 NOTE — Telephone Encounter (Signed)
The Neurologist or Dr Larna Daughters should order this, patient has not seen [place] in some time. Let me know if there are problems. Thanks.

## 2013-09-24 NOTE — Telephone Encounter (Signed)
Alexis calling from [employer] for a verbal social work order. Call at [phone].

## 2013-09-25 ENCOUNTER — Telehealth: Payer: Self-pay

## 2013-09-25 NOTE — Telephone Encounter (Signed)
Alexis an Charity fundraiser with [facility] called to get verbal order for social worker to get more assistance.  Verbal given.

## 2013-09-26 ENCOUNTER — Ambulatory Visit (INDEPENDENT_AMBULATORY_CARE_PROVIDER_SITE_OTHER): Payer: Medicare Other | Admitting: General Practice

## 2013-09-26 ENCOUNTER — Ambulatory Visit (INDEPENDENT_AMBULATORY_CARE_PROVIDER_SITE_OTHER): Payer: Medicare Other | Admitting: Internal Medicine

## 2013-09-26 ENCOUNTER — Encounter: Payer: Self-pay | Admitting: Internal Medicine

## 2013-09-26 VITALS — BP 130/56 | HR 47 | Ht 62.0 in | Wt 97.0 lb

## 2013-09-26 DIAGNOSIS — I6340 Cerebral infarction due to embolism of unspecified cerebral artery: Secondary | ICD-10-CM

## 2013-09-26 DIAGNOSIS — I4891 Unspecified atrial fibrillation: Secondary | ICD-10-CM

## 2013-09-26 DIAGNOSIS — Z7901 Long term (current) use of anticoagulants: Secondary | ICD-10-CM

## 2013-09-26 DIAGNOSIS — I639 Cerebral infarction, unspecified: Secondary | ICD-10-CM

## 2013-09-26 DIAGNOSIS — I495 Sick sinus syndrome: Secondary | ICD-10-CM

## 2013-09-26 LAB — PROTIME-INR: Prothrombin Time: 87.6 s (ref 10.2–12.4)

## 2013-09-26 LAB — POCT INR: INR: 7.8

## 2013-09-26 MED ORDER — AMIODARONE HCL 200 MG PO TABS: 100.0000 mg | ORAL_TABLET | Freq: Every day | ORAL | Status: DC

## 2013-09-26 NOTE — Progress Notes (Signed)
PCP: Elvina Sidle, MD  [name] is a 79 y.o. female who presents today for routine electrophysiology followup.  Since her recent hospital discharge, the patient reports doing reasonably well.  Today, she denies symptoms of palpitations, chest pain, shortness of breath,  lower extremity edema, dizziness, presyncope, or syncope.  The patient is otherwise without complaint today.   Past Medical History  Diagnosis Date  . Dementia   . Hypertension   . Ulcer     Hx of  . Stomach cancer   . Snoring   . Fracture, intertrochanteric, left femur 03/25/2012  . Fracture, intertrochanteric, right femur 06/2008    s/p closed reduction and internal fixation  . Nodule of left lung     followed by Dr. Vassie Loll // LLL spiculated nodule noted per CT in 2012 1.5x1.6x2.4, PET in 06/03/2011 was low uptake.  . Paroxysmal atrial fibrillation   . Tachycardia-bradycardia    Past Surgical History  Procedure Laterality Date  . Stomach surgery      3/4 of stomach removed due to stomach cancer  . Hip surgery  2009    right  . Total hip arthroplasty  03/29/2012    Procedure: TOTAL HIP ARTHROPLASTY;  Surgeon: Eulas Post, MD;  Location: [facility] OR;  Service: Orthopedics;  Laterality: Left;  Left Hip Hemiarthroplasty    Current Outpatient Prescriptions  Medication Sig Dispense Refill  . acetaminophen (TYLENOL) 325 MG tablet Take 1 tablet (325 mg total) by mouth every 4 (four) hours as needed.  30 tablet  0  . amiodarone (PACERONE) 200 MG tablet Take 0.5 tablets (100 mg total) by mouth daily.  30 tablet  1  . antiseptic oral rinse (BIOTENE) LIQD 15 mLs by Mouth Rinse route 2 times daily at 12 noon and 4 pm.  1000 mL  0  . atorvastatin (LIPITOR) 40 MG tablet Take 1 tablet (40 mg total) by mouth daily at 6 PM.  30 tablet  1  . senna-docusate (SENOKOT-S) 8.6-50 MG per tablet Take 1 tablet by mouth at bedtime as needed.      . sodium bicarbonate 650 MG tablet Place 1 tablet (650 mg total) into feeding tube 4 (four)  times daily.      Marland Kitchen warfarin (COUMADIN) 5 MG tablet Take as directed by coumadin clinic       No current facility-administered medications for this visit.    Physical Exam: Filed Vitals:   09/26/13 1111  BP: 130/56  Pulse: 47  Height: 5\' 2"  (1.575 m)  Weight: 97 lb (43.999 kg)    GEN- The patient is elderly appearing, alert     Head- normocephalic, atraumatic Eyes-  Sclera clear, conjunctiva pink Ears- hearing intact Oropharynx- clear Lungs- Clear to ausculation bilaterally, normal work of breathing Heart- Regular rate and rhythm, no murmurs, rubs or gallops, PMI not laterally displaced GI- soft, NT, ND, + BS Extremities- no clubbing, cyanosis, or edema  ekg 9/14 reviewed  Assessment and Plan:  1. afib Maintaining sinus rhythm with amiodarone Decrease amiodarone to100mg  daily Coumadin (goal INR 2-3)  2. Bradycardia/tachycardia Asymptomatic Given advanced age and problems, we will try to avoid PPM  No changes today  Return to see Tereso Newcomer in 2 months I will see in 4 months

## 2013-09-26 NOTE — Patient Instructions (Signed)
Your physician recommends that you schedule a follow-up appointment in: 2 months with Lilian Coma and 4 months with Dr Johney Frame   Your physician has recommended you make the following change in your medication:  1) Decrease Amiodarone to 100mg  daily (1/2 tablet daily)

## 2013-09-30 ENCOUNTER — Ambulatory Visit: Payer: Medicare Other | Admitting: Cardiovascular Disease

## 2013-09-30 ENCOUNTER — Inpatient Hospital Stay: Payer: Medicare Other | Admitting: Physical Medicine & Rehabilitation

## 2013-09-30 ENCOUNTER — Encounter: Payer: Medicare Other | Attending: Physical Medicine & Rehabilitation

## 2013-09-30 DIAGNOSIS — I4891 Unspecified atrial fibrillation: Secondary | ICD-10-CM

## 2013-09-30 DIAGNOSIS — Z7901 Long term (current) use of anticoagulants: Secondary | ICD-10-CM

## 2013-09-30 DIAGNOSIS — I639 Cerebral infarction, unspecified: Secondary | ICD-10-CM

## 2013-10-04 ENCOUNTER — Telehealth: Payer: Self-pay

## 2013-10-04 ENCOUNTER — Ambulatory Visit (INDEPENDENT_AMBULATORY_CARE_PROVIDER_SITE_OTHER): Payer: Medicare Other | Admitting: Pharmacist

## 2013-10-04 DIAGNOSIS — I6340 Cerebral infarction due to embolism of unspecified cerebral artery: Secondary | ICD-10-CM

## 2013-10-04 DIAGNOSIS — I639 Cerebral infarction, unspecified: Secondary | ICD-10-CM

## 2013-10-04 DIAGNOSIS — Z7901 Long term (current) use of anticoagulants: Secondary | ICD-10-CM

## 2013-10-04 DIAGNOSIS — I4891 Unspecified atrial fibrillation: Secondary | ICD-10-CM

## 2013-10-04 NOTE — Telephone Encounter (Signed)
[name], speech therapist from [facility] needs verbal orders to continue speech therapy. Patient of Dr Milus Glazier.  Cb# [phone] [facility] #)

## 2013-10-04 NOTE — Telephone Encounter (Signed)
Please relay my approval for speech therapy for 3 months several times a week

## 2013-10-07 NOTE — Telephone Encounter (Signed)
Left message for her extended order, can only extend for one month this is done.

## 2013-10-10 ENCOUNTER — Telehealth: Payer: Self-pay

## 2013-10-10 NOTE — Telephone Encounter (Signed)
DIANNE FROM [facility] STATES THEY ARE TO LET THE DR KNOW WHEN PT MISS AN APPT AND SHE DID MISS THIS WEEK, [place] YOU MAY CALL HER AT [phone] IF NEEDED

## 2013-10-10 NOTE — Telephone Encounter (Signed)
Noted  

## 2013-10-11 ENCOUNTER — Ambulatory Visit (INDEPENDENT_AMBULATORY_CARE_PROVIDER_SITE_OTHER): Payer: Medicare Other | Admitting: Cardiology

## 2013-10-11 DIAGNOSIS — I4891 Unspecified atrial fibrillation: Secondary | ICD-10-CM

## 2013-10-11 DIAGNOSIS — I6340 Cerebral infarction due to embolism of unspecified cerebral artery: Secondary | ICD-10-CM

## 2013-10-11 DIAGNOSIS — I639 Cerebral infarction, unspecified: Secondary | ICD-10-CM

## 2013-10-11 DIAGNOSIS — Z7901 Long term (current) use of anticoagulants: Secondary | ICD-10-CM

## 2013-10-15 ENCOUNTER — Telehealth: Payer: Self-pay

## 2013-10-15 NOTE — Telephone Encounter (Signed)
[name] with [facility] is setting up a swallow test.  Needs Dr. Milus Glazier to sign and return the papers if he is okay with this.   This is being done through [facility]   [phone]  [facility]

## 2013-10-16 ENCOUNTER — Other Ambulatory Visit (HOSPITAL_COMMUNITY): Payer: Self-pay | Admitting: Family Medicine

## 2013-10-16 DIAGNOSIS — R1310 Dysphagia, unspecified: Secondary | ICD-10-CM

## 2013-10-16 NOTE — Telephone Encounter (Signed)
ALLISON CALLED TODAY TO SAY IF THE PAPERS WEREN'T IN SOON, THEY WON'T BE ABLE TO DO THE NEXT ON Friday. PLEASE CALL HER AT [phone]

## 2013-10-18 ENCOUNTER — Ambulatory Visit (INDEPENDENT_AMBULATORY_CARE_PROVIDER_SITE_OTHER): Payer: Medicare Other | Admitting: Pharmacist

## 2013-10-18 ENCOUNTER — Ambulatory Visit (HOSPITAL_COMMUNITY)
Admission: RE | Admit: 2013-10-18 | Discharge: 2013-10-18 | Disposition: A | Discharge status: Home / Self Care | Payer: Medicare Other | Source: Ambulatory Visit | Attending: Family Medicine | Admitting: Family Medicine

## 2013-10-18 DIAGNOSIS — I639 Cerebral infarction, unspecified: Secondary | ICD-10-CM

## 2013-10-18 DIAGNOSIS — I4891 Unspecified atrial fibrillation: Secondary | ICD-10-CM

## 2013-10-18 DIAGNOSIS — R1310 Dysphagia, unspecified: Secondary | ICD-10-CM | POA: Insufficient documentation

## 2013-10-18 DIAGNOSIS — I6340 Cerebral infarction due to embolism of unspecified cerebral artery: Secondary | ICD-10-CM

## 2013-10-18 DIAGNOSIS — Z7901 Long term (current) use of anticoagulants: Secondary | ICD-10-CM

## 2013-10-18 LAB — POCT INR: INR: 2.5

## 2013-10-18 NOTE — Procedures (Signed)
Objective Swallowing Evaluation: Modified Barium Swallowing Study  Patient Details  Name: [name] MRN: [id number] Date of Birth: 03-09-1926  Today's Date: 10/18/2013 Time: 1100-1145 SLP Time Calculation (min): 45 min  Past Medical History:  Past Medical History  Diagnosis Date  . Dementia   . Hypertension   . Ulcer     Hx of  . Stomach cancer   . Snoring   . Fracture, intertrochanteric, left femur 03/25/2012  . Fracture, intertrochanteric, right femur 06/2008    s/p closed reduction and internal fixation  . Nodule of left lung     followed by Dr. Vassie Loll // LLL spiculated nodule noted per CT in 2012 1.5x1.6x2.4, PET in 06/03/2011 was low uptake.  . Paroxysmal atrial fibrillation   . Tachycardia-bradycardia    Past Surgical History:  Past Surgical History  Procedure Laterality Date  . Stomach surgery      3/4 of stomach removed due to stomach cancer  . Hip surgery  2009    right  . Total hip arthroplasty  03/29/2012    Procedure: TOTAL HIP ARTHROPLASTY;  Surgeon: Eulas Post, MD;  Location: [facility] OR;  Service: Orthopedics;  Laterality: Left;  Left Hip Hemiarthroplasty   HPI:  [name] seen for outpatient MBS accompanied by son recommended by home health ST due to coughing/wet vocal qualtiy during meals.  PMH is significant for HTN, dementia, gastric ca, CVA9/2014, PUD, and stroke in 2003.  Prior MBS 09/10/13 recommended Dys 1 (puree) and thin liquids.     Assessment / Plan / Recommendation Clinical Impression  Dysphagia Diagnosis: Mild pharyngeal phase dysphagia;Moderate pharyngeal phase dysphagia Clinical impression: Pt. exhibited mild-moderate motor based pharyngeal dysphagia with reduced tongue base retraction leading to moderate vallecular residue.   Verbal cues for second swallow effectively decreased residual.  Decreased laryngeal elevation and epiglottic deflection resulted in laryngeal penetration (silent) with thin barium during and after swallow from vallecular  residue.  A chin tuck strategy with cup sips thin prevented penetration in 95% of trials and cues for throat clear/cough cleared penetration.  Recommend pt. continue regular texture diet using caution and/or avoidance of dry, crumbly foods and thin liquids utilizing a chin tuck and SMALL sips, swallow 2 times and volitional throat clear/cough intermittently during meals/snacks, pills crushed and no straws.        Treatment Recommendation  Defer treatment plan to SLP at (Comment)    Diet Recommendation Regular;Thin liquid   Liquid Administration via: Cup;No straw Medication Administration: Crushed with puree Supervision: Full supervision/cueing for compensatory strategies;Patient able to self feed Compensations: Slow rate;Small sips/bites;Multiple dry swallows after each bite/sip;Clear throat intermittently Postural Changes and/or Swallow Maneuvers: Seated upright 90 degrees;Chin tuck (chin tuck with liquids)    Other  Recommendations Oral Care Recommendations: Oral care BID   Follow Up Recommendations  Home health SLP    Frequency and Duration        Pertinent Vitals/Pain n/a          Reason for Referral Objectively evaluate swallowing function   Oral Phase Oral Preparation/Oral Phase Oral Phase: WFL Oral - Honey Oral - Honey Teaspoon: Not tested Oral - Nectar Oral - Nectar Teaspoon: Within functional limits Oral - Nectar Cup: Within functional limits Oral - Nectar Straw: Not tested Oral - Thin Oral - Thin Teaspoon: Within functional limits Oral - Thin Cup: Within functional limits Oral - Solids Oral - Puree: Within functional limits Oral - Mechanical Soft: Not tested   Pharyngeal Phase Pharyngeal Phase Pharyngeal Phase:  Impaired Pharyngeal - Honey Pharyngeal - Honey Teaspoon: Not tested Pharyngeal - Nectar Pharyngeal - Nectar Teaspoon: Pharyngeal residue - pyriform sinuses;Pharyngeal residue - valleculae;Reduced tongue base retraction;Reduced laryngeal  elevation Pharyngeal - Nectar Cup: Pharyngeal residue - pyriform sinuses;Pharyngeal residue - valleculae;Reduced laryngeal elevation;Reduced tongue base retraction Pharyngeal - Nectar Straw: Not tested Pharyngeal - Thin Pharyngeal - Thin Teaspoon: Pharyngeal residue - valleculae;Pharyngeal residue - pyriform sinuses;Reduced laryngeal elevation;Reduced tongue base retraction;Penetration/Aspiration during swallow Penetration/Aspiration details (thin teaspoon): Material enters airway, remains ABOVE vocal cords then ejected out Pharyngeal - Thin Cup: Penetration/Aspiration during swallow;Pharyngeal residue - valleculae;Reduced tongue base retraction;Reduced laryngeal elevation (chin tuck effective) Penetration/Aspiration details (thin cup): Material enters airway, passes BELOW cords without attempt by patient to eject out (silent aspiration);Material enters airway, remains ABOVE vocal cords and not ejected out Pharyngeal - Thin Straw: Not tested Pharyngeal - Solids Pharyngeal - Puree: Pharyngeal residue - valleculae;Reduced tongue base retraction Pharyngeal - Mechanical Soft: Not tested Pharyngeal - Regular: Pharyngeal residue - valleculae;Reduced laryngeal elevation  Cervical Esophageal Phase    GO    Cervical Esophageal Phase Cervical Esophageal Phase: [facility]    Functional Assessment Tool Used: clinical judgement Functional Limitations: Swallowing Swallow Current Status (Z6109): At least 40 percent but less than 60 percent impaired, limited or restricted Swallow Goal Status [phone]): At least 40 percent but less than 60 percent impaired, limited or restricted Swallow Discharge Status [phone]): At least 40 percent but less than 60 percent impaired, limited or restricted    Royce Macadamia M.Ed [employer] [phone]  10/18/2013

## 2013-10-21 ENCOUNTER — Telehealth: Payer: Self-pay | Admitting: Internal Medicine

## 2013-10-21 ENCOUNTER — Telehealth: Payer: Self-pay | Admitting: Radiology

## 2013-10-21 NOTE — Telephone Encounter (Signed)
Faxed back today

## 2013-10-21 NOTE — Telephone Encounter (Signed)
Pended order for swallowing study, this has already been done. Please sign.

## 2013-10-21 NOTE — Telephone Encounter (Signed)
New Problem  Hellen from [facility] states there was an order faxed for Home Health Care on 10/23 and 10/25.Marland Kitchen Has not received a fax back.. Will refax another order.. Please call if you have any questions

## 2013-10-23 ENCOUNTER — Telehealth: Payer: Self-pay

## 2013-10-23 NOTE — Telephone Encounter (Signed)
Judeth Cornfield from [facility], phone # [phone], called to report that pt's son is requesting a nutrition consult for pt d/t her weight loss and reduced intake. [facility] has a nutritionist that can speak w/pt on telephone but they don't offer a nutritionist who make a visit to pt and thought that would be best if we could order one. Spoke w/Donna who suggested checking w/[facility] to see if they could do this. I called and was advised that they do not have a dietician that can make a home visit either. Dr L, do you know of anyone who provides at home nutrition consult, or do you want me to give order to Judeth Cornfield to have their nutritionist make a telephone consult?

## 2013-10-23 NOTE — Telephone Encounter (Signed)
[name] is no longer a patient at our facility.  I never received a full discharge summary and her family has been rude to our staff repeatedly. She needs to find an internist that can handle her complex medical situation.  Please explain to family that her problems are beyond our Urgent Care capabilities.

## 2013-10-23 NOTE — Telephone Encounter (Signed)
Dr Milus Glazier, are you sure that this is the pt that you are thinking of? I don't see anything in any messages that state that pt is being [facility] or family being rude. Please advise.

## 2013-10-30 NOTE — Telephone Encounter (Signed)
Are you going to sign this order?

## 2013-10-31 ENCOUNTER — Telehealth: Payer: Self-pay | Admitting: Internal Medicine

## 2013-10-31 NOTE — Telephone Encounter (Signed)
Orders faxed

## 2013-10-31 NOTE — Telephone Encounter (Signed)
I did not order this.  I have told the hospital and nursing home that I am no longer her primary care physician.

## 2013-10-31 NOTE — Telephone Encounter (Signed)
New message    Faxed over orders a week ago but have not received them back. Please fax orders to [phone]

## 2013-11-01 ENCOUNTER — Ambulatory Visit (INDEPENDENT_AMBULATORY_CARE_PROVIDER_SITE_OTHER): Payer: Medicare Other | Admitting: Pharmacist

## 2013-11-01 DIAGNOSIS — I4891 Unspecified atrial fibrillation: Secondary | ICD-10-CM

## 2013-11-01 DIAGNOSIS — I6340 Cerebral infarction due to embolism of unspecified cerebral artery: Secondary | ICD-10-CM

## 2013-11-01 DIAGNOSIS — Z7901 Long term (current) use of anticoagulants: Secondary | ICD-10-CM

## 2013-11-01 DIAGNOSIS — I639 Cerebral infarction, unspecified: Secondary | ICD-10-CM

## 2013-11-01 LAB — POCT INR: INR: 1.9

## 2013-11-01 NOTE — Telephone Encounter (Signed)
Noted thanks °

## 2013-11-05 NOTE — Telephone Encounter (Signed)
No notes

## 2013-11-12 ENCOUNTER — Other Ambulatory Visit: Payer: Self-pay | Admitting: Physician Assistant

## 2013-11-12 ENCOUNTER — Telehealth: Payer: Self-pay | Admitting: Internal Medicine

## 2013-11-12 NOTE — Telephone Encounter (Signed)
New Message   [name] w/ [facility] called---- States that on 10/31/2013, two orders were faxed for Dr. Hillery Jacks to complete. One order was completed, but the other was not// Elmarie Shiley will fax another order for completion.

## 2013-11-16 ENCOUNTER — Telehealth: Payer: Self-pay

## 2013-11-16 DIAGNOSIS — I639 Cerebral infarction, unspecified: Secondary | ICD-10-CM

## 2013-11-16 NOTE — Telephone Encounter (Signed)
Patient calling to get a second opinion from Dr. Milus Glazier, patient was [facility] in [facility] for a busted blood vessel in eye. Patient on Coumadin. She was taken off coumadin for 2 days per ED dr. Patients son Nida Boatman that is calling for his mother says that the at home nurse put her back on coumadin 2.5 mg continuing. He wants a second opinion if this is correct for his mother. Please advise. Notes in Epic from ED visit.   Son [place]: [phone]

## 2013-11-18 ENCOUNTER — Other Ambulatory Visit: Payer: Self-pay | Admitting: Radiology

## 2013-11-18 NOTE — Telephone Encounter (Signed)
Pt's son called for medical advise concerning coumadin, pt was advised by Chelle to f/u with cardiologist, which is she has an appointment tomorrow. Pt was satisfied by the instructions given.

## 2013-11-19 ENCOUNTER — Ambulatory Visit: Payer: Medicare Other | Admitting: Physician Assistant

## 2013-11-21 ENCOUNTER — Ambulatory Visit: Payer: Medicare Other | Admitting: Physician Assistant

## 2013-11-22 NOTE — Telephone Encounter (Signed)
Pt son Molly Maduro called, Doctor is Milus Glazier, Molly Maduro states [facility] got a bed spring of 2013, Medicare is approving the bed, [employer] is the name of the company. Robert need approval from [facility] doctor and the correct documentation about her stroke.

## 2013-11-22 NOTE — Telephone Encounter (Signed)
Patients son Molly Maduro indicates mother has a hospital bed through medicare. Medicare will not pay for it, she has had it since 2013 when she broke her hip. I have advised him the doctor who took care of her hip needs to take care of her hospital bed. But son indicates she does not need the bed because of her hip, she now needs it for her stroke. Please advise if you can write the order for the hospital bed for her, following the stroke, pended the order.

## 2013-11-22 NOTE — Telephone Encounter (Signed)
What?

## 2013-11-26 ENCOUNTER — Ambulatory Visit (INDEPENDENT_AMBULATORY_CARE_PROVIDER_SITE_OTHER): Payer: Medicare Other | Admitting: Physician Assistant

## 2013-11-26 ENCOUNTER — Ambulatory Visit (INDEPENDENT_AMBULATORY_CARE_PROVIDER_SITE_OTHER): Payer: Medicare Other | Admitting: Pharmacist

## 2013-11-26 ENCOUNTER — Encounter: Payer: Self-pay | Admitting: Physician Assistant

## 2013-11-26 ENCOUNTER — Ambulatory Visit: Payer: Medicare Other | Admitting: Physician Assistant

## 2013-11-26 VITALS — BP 106/46 | HR 41 | Ht 62.0 in | Wt 94.0 lb

## 2013-11-26 DIAGNOSIS — I10 Essential (primary) hypertension: Secondary | ICD-10-CM

## 2013-11-26 DIAGNOSIS — I498 Other specified cardiac arrhythmias: Secondary | ICD-10-CM

## 2013-11-26 DIAGNOSIS — I4891 Unspecified atrial fibrillation: Secondary | ICD-10-CM

## 2013-11-26 DIAGNOSIS — I6340 Cerebral infarction due to embolism of unspecified cerebral artery: Secondary | ICD-10-CM

## 2013-11-26 DIAGNOSIS — R001 Bradycardia, unspecified: Secondary | ICD-10-CM

## 2013-11-26 DIAGNOSIS — Z7901 Long term (current) use of anticoagulants: Secondary | ICD-10-CM

## 2013-11-26 DIAGNOSIS — I639 Cerebral infarction, unspecified: Secondary | ICD-10-CM

## 2013-11-26 DIAGNOSIS — E785 Hyperlipidemia, unspecified: Secondary | ICD-10-CM

## 2013-11-26 LAB — HEPATIC FUNCTION PANEL
ALT: 23 U/L (ref 0–35)
Bilirubin, Direct: 0.1 mg/dL (ref 0.0–0.3)
Total Bilirubin: 0.6 mg/dL (ref 0.3–1.2)

## 2013-11-26 LAB — TSH: TSH: 8.04 u[IU]/mL — ABNORMAL HIGH (ref 0.35–5.50)

## 2013-11-26 LAB — POCT INR: INR: 3

## 2013-11-26 NOTE — Telephone Encounter (Signed)
I am referring patient to internal medicine and will sign for the hospital bed.  She is too complicated to manage from an urgent care setting.  I have repeatedly communicated that this patient needs to find an internist and that we can no longer be her physicians.

## 2013-11-26 NOTE — Progress Notes (Signed)
[address] [place], [place]  [address] Phone: [phone] Fax:  [phone]  Date:  11/26/2013   ID:  [name], DOB [birth year], MRN [id number]  PCP:  Elvina Sidle, MD  Cardiologist:  Dr. Hillis Range     History of Present Illness: [name] is a 79 y.o. female with a history of HTN, CKD Stage III, prior stroke. She was admitted in 08/2013 with left MCA stroke. During her hospitalization, she was noted to have paroxysmal atrial fibrillation. She was placed on amiodarone for rhythm control. She was also placed on Coumadin for anticoagulation (CHADS2-VASc=6).  Echo (08/2013): EF 65-70%, normal wall motion, grade 1 diastolic dysfunction, mild AI, mildly dilated aortic root, MAC. Carotid US (08/2013): Bilateral 1-39% ICA stenosis.  She did have evidence of tachybradycardia syndrome. Given her advanced age, we are trying to avoid proceeding to pacemaker implantation. She was last seen by Dr. Johney Frame in 09/2013. Amiodarone was reduced to 100 mg daily at that time.  Since, last seen, she has been stable.  She is here with her son.  She is tolerating coumadin without bleeding issues.  She denies palpitations.  She is limited in what she can do and remains quite weak.  She continues to have speech issues since her stroke.  She denies chest pain, dyspnea, syncope, orthopnea, PND, edema.  No dizziness, lightheadedness.  Recent Labs: 08/30/2013: HDL 87; LDL (calc) 111*  09/02/2013: TSH 1.446  09/06/2013: ALT 19  09/13/2013: Hemoglobin 10.2*  09/17/2013: Creatinine 2.16*; Potassium 4.6   Wt Readings from Last 3 Encounters:  11/26/13 94 lb (42.638 kg)  09/26/13 97 lb (43.999 kg)  09/17/13 98 lb 1.7 oz (44.5 kg)     Past Medical History  Diagnosis Date  . Dementia   . Hypertension   . Ulcer     Hx of  . Stomach cancer   . Snoring   . Fracture, intertrochanteric, left femur 03/25/2012  . Fracture, intertrochanteric, right femur 06/2008    s/p closed reduction and internal  fixation  . Nodule of left lung     followed by Dr. Vassie Loll // LLL spiculated nodule noted per CT in 2012 1.5x1.6x2.4, PET in 06/03/2011 was low uptake.  . Paroxysmal atrial fibrillation   . Tachycardia-bradycardia     Current Outpatient Prescriptions  Medication Sig Dispense Refill  . acetaminophen (TYLENOL) 325 MG tablet Take 1 tablet (325 mg total) by mouth every 4 (four) hours as needed.  30 tablet  0  . amiodarone (PACERONE) 200 MG tablet Take 0.5 tablets (100 mg total) by mouth daily.  30 tablet  1  . atorvastatin (LIPITOR) 40 MG tablet Take 1 tablet (40 mg total) by mouth daily at 6 PM.  30 tablet  1  . sodium bicarbonate 650 MG tablet Place 1 tablet (650 mg total) into feeding tube 4 (four) times daily.      Marland Kitchen warfarin (COUMADIN) 5 MG tablet TAKE 1 TABLET ONCE DAILY WITH SUPPER.  30 tablet  0  . antiseptic oral rinse (BIOTENE) LIQD 15 mLs by Mouth Rinse route 2 times daily at 12 noon and 4 pm.  1000 mL  0  . senna-docusate (SENOKOT-S) 8.6-50 MG per tablet Take 1 tablet by mouth at bedtime as needed.       No current facility-administered medications for this visit.    Allergies:   Review of patient's allergies indicates no known allergies.   Social History:  The patient  reports that she quit smoking  about 39 years ago. Her smoking use included Cigarettes. She has a .6 pack-year smoking history. She has never used smokeless tobacco. She reports that she drinks alcohol.   Family History:  The patient's family history includes Allergies in an other family member; Cancer in her father; Liver cancer in her son.   ROS:  Please see the history of present illness.      All other systems reviewed and negative.   PHYSICAL EXAM: VS:  BP 106/46  Pulse 41  Ht 5\' 2"  (1.575 m)  Wt 94 lb (42.638 kg)  BMI 17.19 kg/m2 Thin frail elderly female in no acute distress HEENT: normal Neck: no JVD at 90 degrees Cardiac:  normal S1, S2; RRR; no murmur Lungs:  clear to auscultation bilaterally, no  wheezing, rhonchi or rales Abd: soft, nontender, no hepatomegaly Ext: no edema Skin: warm and dry Neuro:  CNs 2-12 intact, no focal abnormalities noted  EKG:  Sinus bradycardia, HR 41, RBBB, no change from prior     ASSESSMENT AND PLAN:  1. Atrial Fibrillation:  Maintaining NSR.  Continue current dose of Amiodarone.  Check TSH, LFTs today.  Continue follow up with the coumadin clinic (goal INR 2-3).   2. Bradycardia:  She is asymptomatic.  We are trying to avoid PPM given advanced age. 3. Hypertension:  Controlled. 4. Hyperlipidemia:  Continue statin.   5. Disposition:  F/u with Dr. Hillis Range as planned in 01/2013.  Signed, Tereso Newcomer, PA-C  11/26/2013 12:25 PM

## 2013-11-26 NOTE — Telephone Encounter (Signed)
Called son, mailbox full

## 2013-11-26 NOTE — Patient Instructions (Signed)
Your physician recommends that you continue on your current medications as directed. Please refer to the Current Medication list given to you today.  LAB WORK TODAY; TSH, LFT  PLEASE FOLLOW UP WITH DR. ALLRED IN 01/29/14 @ 3:45

## 2013-11-27 ENCOUNTER — Telehealth: Payer: Self-pay | Admitting: *Deleted

## 2013-11-27 ENCOUNTER — Encounter: Payer: Self-pay | Admitting: *Deleted

## 2013-11-27 DIAGNOSIS — I4891 Unspecified atrial fibrillation: Secondary | ICD-10-CM

## 2013-11-27 NOTE — Telephone Encounter (Signed)
could not lmom on home [phone] mail box full, cell phone [phone] says to call [phone]. I will send out result letter for ptcb to schedule repeat labs in 3 weeks per Bing Neighbors. PA..

## 2013-11-28 NOTE — Telephone Encounter (Signed)
Pt son stated that he could not deal with this matter today and will call back tom.  He wanted [place] to call [employer] to let them know that we are referring her to an internist.  I advised him that we do not have the number to call.  He will call tom.

## 2013-12-03 ENCOUNTER — Telehealth: Payer: Self-pay | Admitting: *Deleted

## 2013-12-03 DIAGNOSIS — I495 Sick sinus syndrome: Secondary | ICD-10-CM

## 2013-12-03 DIAGNOSIS — I4891 Unspecified atrial fibrillation: Secondary | ICD-10-CM

## 2013-12-03 NOTE — Telephone Encounter (Signed)
Pt son, Nida Boatman called into inform us that pt has 3 bruises on Rt calf. One is larger than other 2, the largest has small laceration noted in it. Son states that there is no redness, swelling or warmth on leg. Pt has no other bruises or other bleeding issues. Instructed to continue same coumadin dose and keep scheduled appt and call us back if he notices any other bruises or bleeding.

## 2013-12-03 NOTE — Telephone Encounter (Signed)
Was able today to lmptcb on [phone] to discuss lab results and recommendations from [place]. PA. I also sent out a result letter last week for ptcb. Still have not heard back.  Addendum while I was typing this note pt's son cb and asked how long had I been trying to reach them, I said since 11/27/13. I stated the [phone] mail box was full and cell # [phone] said to call [phone]. I told pt's son that since I could not reach pt for these results that I also had sent out a letter for ptcb, so I told him he could disregard letter now. Son apologized to me that I could not get through last week. Pt will come in 12/16/12 for repeat TSH, FREE T4, FREE T3. son verbalized understanding today.  I cb pt's son to advise we can just do labs on 12/18/13 when she comes in for CVRR appt. Son said ok and thank you.

## 2013-12-03 NOTE — Progress Notes (Signed)
lmptcb x 2. Was able to lmom today on [phone] to go over lab results and recommendations from [place], [place]. I also had sent out a result letter to the pt last week for ptcb to discuss results.

## 2013-12-14 ENCOUNTER — Other Ambulatory Visit: Payer: Self-pay | Admitting: Physician Assistant

## 2013-12-16 ENCOUNTER — Other Ambulatory Visit: Payer: Self-pay

## 2013-12-16 NOTE — Telephone Encounter (Signed)
Pt's son requests RF of lipitor. We have not been Rxing this recently and pt is due for lab. Son stated he will bring pt in w/in a week, but requests Rx sent before that if approved. Dr L, do you want to RX? I have pended it.

## 2013-12-17 ENCOUNTER — Other Ambulatory Visit: Payer: Self-pay | Admitting: Physician Assistant

## 2013-12-18 ENCOUNTER — Other Ambulatory Visit (INDEPENDENT_AMBULATORY_CARE_PROVIDER_SITE_OTHER): Payer: Medicare Other

## 2013-12-18 ENCOUNTER — Ambulatory Visit (INDEPENDENT_AMBULATORY_CARE_PROVIDER_SITE_OTHER): Payer: Medicare Other | Admitting: Pharmacist

## 2013-12-18 DIAGNOSIS — I4891 Unspecified atrial fibrillation: Secondary | ICD-10-CM

## 2013-12-18 DIAGNOSIS — I6340 Cerebral infarction due to embolism of unspecified cerebral artery: Secondary | ICD-10-CM

## 2013-12-18 DIAGNOSIS — Z5181 Encounter for therapeutic drug level monitoring: Secondary | ICD-10-CM

## 2013-12-18 DIAGNOSIS — Z7901 Long term (current) use of anticoagulants: Secondary | ICD-10-CM

## 2013-12-18 DIAGNOSIS — I495 Sick sinus syndrome: Secondary | ICD-10-CM

## 2013-12-18 DIAGNOSIS — I639 Cerebral infarction, unspecified: Secondary | ICD-10-CM

## 2013-12-18 LAB — POCT INR: INR: 5.5

## 2013-12-18 MED ORDER — ATORVASTATIN CALCIUM 40 MG PO TABS: 40.0000 mg | ORAL_TABLET | Freq: Every day | ORAL | Status: DC

## 2013-12-19 ENCOUNTER — Telehealth: Payer: Self-pay | Admitting: Internal Medicine

## 2013-12-19 ENCOUNTER — Encounter: Payer: Self-pay | Admitting: Physician Assistant

## 2013-12-19 DIAGNOSIS — E032 Hypothyroidism due to medicaments and other exogenous substances: Secondary | ICD-10-CM | POA: Insufficient documentation

## 2013-12-19 LAB — T4, FREE: Free T4: 1.04 ng/dL (ref 0.60–1.60)

## 2013-12-19 LAB — T3, FREE: T3, Free: 1.3 pg/mL — ABNORMAL LOW (ref 2.3–4.2)

## 2013-12-19 LAB — TSH: TSH: 16.93 u[IU]/mL — ABNORMAL HIGH (ref 0.35–5.50)

## 2013-12-19 NOTE — Telephone Encounter (Signed)
Please refax

## 2013-12-19 NOTE — Telephone Encounter (Signed)
faxed

## 2013-12-19 NOTE — Telephone Encounter (Signed)
New Problem:  [name] is calling to check on the status of some home health forms.. She would like a call back.Marland Kitchen

## 2013-12-20 ENCOUNTER — Ambulatory Visit (INDEPENDENT_AMBULATORY_CARE_PROVIDER_SITE_OTHER): Payer: Medicare Other

## 2013-12-20 ENCOUNTER — Telehealth: Payer: Self-pay | Admitting: *Deleted

## 2013-12-20 DIAGNOSIS — I6340 Cerebral infarction due to embolism of unspecified cerebral artery: Secondary | ICD-10-CM

## 2013-12-20 DIAGNOSIS — E039 Hypothyroidism, unspecified: Secondary | ICD-10-CM

## 2013-12-20 DIAGNOSIS — Z7901 Long term (current) use of anticoagulants: Secondary | ICD-10-CM

## 2013-12-20 DIAGNOSIS — I639 Cerebral infarction, unspecified: Secondary | ICD-10-CM

## 2013-12-20 DIAGNOSIS — I4891 Unspecified atrial fibrillation: Secondary | ICD-10-CM

## 2013-12-20 DIAGNOSIS — Z5181 Encounter for therapeutic drug level monitoring: Secondary | ICD-10-CM

## 2013-12-20 LAB — POCT INR: INR: 6.5

## 2013-12-20 MED ORDER — LEVOTHYROXINE SODIUM 25 MCG PO TABS: 25.0000 ug | ORAL_TABLET | Freq: Every day | ORAL | Status: DC

## 2013-12-20 NOTE — Telephone Encounter (Signed)
pt's notified about lab results and to have pt start synthroid 25 mcg. lab work on 2/18 when pt see's Dr. Rayann Heman. Son verbalaized understanding to Plan of Care.

## 2013-12-24 ENCOUNTER — Ambulatory Visit (INDEPENDENT_AMBULATORY_CARE_PROVIDER_SITE_OTHER): Payer: Medicare Other | Admitting: Pharmacist

## 2013-12-24 DIAGNOSIS — I639 Cerebral infarction, unspecified: Secondary | ICD-10-CM

## 2013-12-24 DIAGNOSIS — Z7901 Long term (current) use of anticoagulants: Secondary | ICD-10-CM

## 2013-12-24 DIAGNOSIS — I6340 Cerebral infarction due to embolism of unspecified cerebral artery: Secondary | ICD-10-CM

## 2013-12-24 DIAGNOSIS — Z5181 Encounter for therapeutic drug level monitoring: Secondary | ICD-10-CM

## 2013-12-24 DIAGNOSIS — I4891 Unspecified atrial fibrillation: Secondary | ICD-10-CM

## 2013-12-24 LAB — POCT INR: INR: 1.7

## 2013-12-30 ENCOUNTER — Telehealth: Payer: Self-pay | Admitting: Internal Medicine

## 2013-12-30 NOTE — Telephone Encounter (Signed)
Patient's son called looking for assistance in keeping the hospital bed and wheelchair that his mom has been using since a broken hip over a year ago.  Pt has since had CVA and continues to need DME.  Instructed him to speak with her PCP but he states they have not been able to assist.  Read note in chart that states Dr. Audley Hose said he would sign for bed and also that he is referring pt to an internist as she is too complicated for urgent care.  Pt's son says he is unaware of this information.  Advised him that Dr. Rayann Heman and his nurse are not here today.  Instructed him to call DME company and tell them to investigate, Dr. Audley Hose documented that he would sign for the bed.

## 2013-12-30 NOTE — Telephone Encounter (Signed)
Pt's son stated that [employer] is going to come p/up the bed and wheelchair if they don't get what they need from the MD. Spoke to Amy who clarified that Dr L did not sign for the bed, he referred to [facility] to est care and to do this. I explained this to son [name] and also advised that pt has appt on 01/01/13 w/them and he needs to make sure she keeps appt and they eval/document and write for the bed/wheelchair during visit. Also advised that cardiologist may be able to do this since they have been seeing pt for her stroke and have current notes. Son agreed.

## 2013-12-30 NOTE — Telephone Encounter (Signed)
New message     Pt is renting wheelchair and hosp bed----the company need documentation from her doctor saying she needs to continue to keep this because she has had a stroke.  Son said he did not get any help from her PCP doctor and thought we might would help.

## 2013-12-31 ENCOUNTER — Telehealth: Payer: Self-pay | Admitting: *Deleted

## 2013-12-31 ENCOUNTER — Telehealth: Payer: Self-pay | Admitting: Internal Medicine

## 2013-12-31 DIAGNOSIS — I6350 Cerebral infarction due to unspecified occlusion or stenosis of unspecified cerebral artery: Secondary | ICD-10-CM

## 2013-12-31 NOTE — Telephone Encounter (Signed)
Will fax order for patient and she has new patient appointment for tomorrow at 9:15 to establish care with Int med.  Her son knows there is an order for bed and will back back with fax number if necessary.

## 2013-12-31 NOTE — Telephone Encounter (Signed)
left VM message for return call/no answer

## 2013-12-31 NOTE — Telephone Encounter (Signed)
New message   Patient son calling with concerns regarding rental he has - hospital bed & wheelchair.

## 2013-12-31 NOTE — Telephone Encounter (Signed)
sched patient for hosp f/u, but they are requesting order for a hospital bed/ wheelchair, has it already but need an order to keep it.

## 2013-12-31 NOTE — Telephone Encounter (Signed)
Ok to do so

## 2014-01-01 ENCOUNTER — Ambulatory Visit: Payer: Self-pay | Admitting: Nurse Practitioner

## 2014-01-01 ENCOUNTER — Telehealth: Payer: Self-pay | Admitting: Neurology

## 2014-01-01 NOTE — Telephone Encounter (Signed)
Order placed and faxed per Ammie, S

## 2014-01-01 NOTE — Telephone Encounter (Signed)
I called and LMVM for pts son about needing name of company.  I called and spoke to [facility] and they do not have her in there system for this equipment.

## 2014-01-01 NOTE — Telephone Encounter (Signed)
Patient's son calling to speak with Ammie, states there was a problem with the notes. Patient's son was vague and wants someone to call him about this. Please call the patient's son back.

## 2014-01-01 NOTE — Telephone Encounter (Signed)
Pt's son, Amayrani Bennick, called in this morning to check on the order for the wheelchair and hospital bed for his mother.  Please call Brad @ [phone] with status.  Thank you

## 2014-01-01 NOTE — Telephone Encounter (Signed)
Called son and left VM for call back, also contacted [employer](Matt), and he is needing more detailed information of a stroke diagnosis, refaxed Dr Jola Baptist note with diagnosis, received confirmation of fax

## 2014-01-01 NOTE — Telephone Encounter (Signed)
Faxed order to [employer] 01/01/14- received confirmation,Matt [phone].  Called patient's son [name]) to inform thru VM

## 2014-01-02 ENCOUNTER — Telehealth: Payer: Self-pay | Admitting: Neurology

## 2014-01-02 NOTE — Telephone Encounter (Signed)
Spoke with [name] and he said that there was no mention of stroke in notes per [employer]. I explained that we had sent AVS summary late yesterday of the stroke diagnosisto their office and patient's son said that he will wait to hear from [employer] and will call back if there is any other request ,questions.

## 2014-01-02 NOTE — Telephone Encounter (Signed)
Pt's son, [name], was calling Amiee back to let her know that what she did for them yesterday didn't work out (see closed message from 12/31/13),  He asked if he could get a call back and see what other options there might be.  Thank you.

## 2014-01-03 ENCOUNTER — Telehealth: Payer: Self-pay

## 2014-01-03 NOTE — Telephone Encounter (Signed)
Pt's son, [name], called to ask for a RF of amiodarone until he can get an appt w/a new PCP. I advised him that the cardiologist is the doctor who has been Rxing that med. Son agreed to call cardiologist and I discussed possible PCPs to try. Other than [name], advised [name] could see our notes and hospitals and son agreed to check w/them as well.

## 2014-01-07 ENCOUNTER — Other Ambulatory Visit: Payer: Self-pay | Admitting: *Deleted

## 2014-01-07 ENCOUNTER — Other Ambulatory Visit: Payer: Self-pay

## 2014-01-07 ENCOUNTER — Other Ambulatory Visit (HOSPITAL_COMMUNITY): Payer: Self-pay | Admitting: Family Medicine

## 2014-01-07 MED ORDER — AMIODARONE HCL 200 MG PO TABS: 100.0000 mg | ORAL_TABLET | Freq: Every day | ORAL | Status: DC

## 2014-01-08 ENCOUNTER — Telehealth: Payer: Self-pay | Admitting: Neurology

## 2014-01-08 NOTE — Telephone Encounter (Signed)
Calling to speak to ammie wants her to call back did not give details he states she knows what it's concerning.

## 2014-01-08 NOTE — Telephone Encounter (Signed)
Son is requesting a letter of neccessity, so that mother may keep her electrical bed.  See note from 01/01/14, need more information as to the stroke patient had and why the need for the bed.

## 2014-01-09 NOTE — Telephone Encounter (Signed)
Called patient's son to sched sooner appt with Dr Leonie Man, lt VM message to return call

## 2014-01-09 NOTE — Telephone Encounter (Signed)
Called and spoke with Matt([employer]), said that patient has had this bed for 2 years for a hip fracture,may have to pick up.  He said that he had spoken with son and advised to possibly place order with a different company and get a new bed.

## 2014-01-10 ENCOUNTER — Ambulatory Visit (INDEPENDENT_AMBULATORY_CARE_PROVIDER_SITE_OTHER): Payer: Medicare Other | Admitting: Pharmacist

## 2014-01-10 DIAGNOSIS — I639 Cerebral infarction, unspecified: Secondary | ICD-10-CM

## 2014-01-10 DIAGNOSIS — I4891 Unspecified atrial fibrillation: Secondary | ICD-10-CM

## 2014-01-10 DIAGNOSIS — I6340 Cerebral infarction due to embolism of unspecified cerebral artery: Secondary | ICD-10-CM

## 2014-01-10 DIAGNOSIS — Z7901 Long term (current) use of anticoagulants: Secondary | ICD-10-CM

## 2014-01-10 LAB — POCT INR: INR: 3.6

## 2014-01-10 NOTE — Telephone Encounter (Signed)
Spoke with Brad(son) and shared message from [employer](Matt) he said that he would call back on Monday to let us know where to send  New referral for bed

## 2014-01-14 ENCOUNTER — Telehealth: Payer: Self-pay | Admitting: Neurology

## 2014-01-14 NOTE — Telephone Encounter (Signed)
REGARDING HOSPITAL BED

## 2014-01-16 ENCOUNTER — Encounter (INDEPENDENT_AMBULATORY_CARE_PROVIDER_SITE_OTHER): Payer: Self-pay

## 2014-01-16 ENCOUNTER — Other Ambulatory Visit (HOSPITAL_COMMUNITY): Payer: Self-pay | Admitting: Family Medicine

## 2014-01-16 ENCOUNTER — Encounter: Payer: Self-pay | Admitting: Nurse Practitioner

## 2014-01-16 ENCOUNTER — Ambulatory Visit (INDEPENDENT_AMBULATORY_CARE_PROVIDER_SITE_OTHER): Payer: Medicare Other | Admitting: Nurse Practitioner

## 2014-01-16 VITALS — BP 127/67 | HR 55 | Ht 64.0 in | Wt 92.0 lb

## 2014-01-16 DIAGNOSIS — R5383 Other fatigue: Secondary | ICD-10-CM

## 2014-01-16 DIAGNOSIS — R5381 Other malaise: Secondary | ICD-10-CM

## 2014-01-16 DIAGNOSIS — R1310 Dysphagia, unspecified: Secondary | ICD-10-CM

## 2014-01-16 DIAGNOSIS — I6350 Cerebral infarction due to unspecified occlusion or stenosis of unspecified cerebral artery: Secondary | ICD-10-CM

## 2014-01-16 DIAGNOSIS — R531 Weakness: Secondary | ICD-10-CM

## 2014-01-16 DIAGNOSIS — R269 Unspecified abnormalities of gait and mobility: Secondary | ICD-10-CM

## 2014-01-16 NOTE — Progress Notes (Signed)
PATIENT: [name] DOB: 1926-03-23   REASON FOR VISIT: follow up for stroke, 08/29/13. HISTORY FROM: patient  HISTORY OF PRESENT ILLNESS: [name] is an 80 y.o. female with a history of hypertension, dementia, stomach cancer and peptic ulcer disease, presenting with new onset of inability to speak on 08/29/13. Patient reportedly was inattentive to those around her and attempted to speak but could not utter any sounds and was aware that she could not speak.  Her previous history of stroke in 2003. She's been on aspirin 81 mg per day. Her ability to speak improved markedly while being evaluated in the emergency room. She had mild residual dysarthria. She also did not remember her correct age. NIH stroke score was 2. Patient had foul-smelling urine and was hypothermic with a rectal temperature of 96.9. She's was admitted for TIA workup as well as for possible urosepsis.  Mri Brain showed Small/patchy left MCA acute infarct. No mass effect or hemorrhage. Otherwise stable MRI appearance of the brain since 2009.  Negative for age intracranial MRA.  2D Echocardiogram showing EF 70%, wall motion normal, LA size normal. Carotid Doppler findings consistent with 1-39 percent stenosis involving the right internal carotid artery and the left internal carotid artery.  She completed inpatient rehab and outpatient PT and ST.  This is her first visit to office since stroke.  She was formerly seen in our office for dementia with Dr. Brett Fairy in 2012.  She was on Aricept and Namenda for some time, but is not now.  She lives with her son, who provides care for her.  She has been on Coumadin since hospital discharge, with no bleeding or significant bruising.  Her blood pressure is well controlled, it is 127/67 in office today.  She is frail appearing, only 92 lbs, BMI 15.  She has had hospital bed at home since hip fracture in April 2013.  Son states the company wants to take it, as it is no longer needed, and he  wants his mother to keep it.  REVIEW OF SYSTEMS: Full 14 system review of systems performed and notable only for:  Constitutional: appetite change, unexpected weight change  Cardiovascular: leg swelling, afib Ear/Nose/Throat: trouble swallowing Gastroitestinal: black stools sometimes Genitourinary: incontinence, urgency Hematology/Lymphatic: bruise easily Endocrine: cold intolerance Neurological: memory loss, speech difficulty  ALLERGIES: No Known Allergies  HOME MEDICATIONS: Outpatient Prescriptions Prior to Visit  Medication Sig Dispense Refill  . acetaminophen (TYLENOL) 325 MG tablet Take 1 tablet (325 mg total) by mouth every 4 (four) hours as needed.  30 tablet  0  . amiodarone (PACERONE) 200 MG tablet Take 0.5 tablets (100 mg total) by mouth daily.  30 tablet  1  . antiseptic oral rinse (BIOTENE) LIQD 15 mLs by Mouth Rinse route 2 times daily at 12 noon and 4 pm.  1000 mL  0  . atorvastatin (LIPITOR) 40 MG tablet Take 1 tablet (40 mg total) by mouth daily at 6 PM.  30 tablet  0  . levothyroxine (SYNTHROID) 25 MCG tablet Take 1 tablet (25 mcg total) by mouth daily before breakfast.  30 tablet  3  . sodium bicarbonate 650 MG tablet Place 1 tablet (650 mg total) into feeding tube 4 (four) times daily.      Marland Kitchen warfarin (COUMADIN) 5 MG tablet TAKE 1 TABLET ONCE DAILY WITH SUPPER.  30 tablet  0  . senna-docusate (SENOKOT-S) 8.6-50 MG per tablet Take 1 tablet by mouth at bedtime as needed.  No facility-administered medications prior to visit.    PAST MEDICAL HISTORY: Past Medical History  Diagnosis Date  . Dementia   . Hypertension   . Ulcer     Hx of  . Stomach cancer   . Snoring   . Fracture, intertrochanteric, left femur 03/25/2012  . Fracture, intertrochanteric, right femur 06/2008    s/p closed reduction and internal fixation  . Nodule of left lung     followed by Dr. Elsworth Soho // LLL spiculated nodule noted per CT in 2012 1.5x1.6x2.4, PET in 06/03/2011 was low uptake.    . Paroxysmal atrial fibrillation   . Tachycardia-bradycardia     PAST SURGICAL HISTORY: Past Surgical History  Procedure Laterality Date  . Stomach surgery      3/4 of stomach removed due to stomach cancer  . Hip surgery  2009    right  . Total hip arthroplasty  03/29/2012    Procedure: TOTAL HIP ARTHROPLASTY;  Surgeon: Johnny Bridge, MD;  Location: [facility];  Service: Orthopedics;  Laterality: Left;  Left Hip Hemiarthroplasty    FAMILY HISTORY: Family History  Problem Relation Age of Onset  . Cancer Father   . Liver cancer Son   . Allergies      SOCIAL HISTORY: History   Social History  . Marital Status: Married    Spouse Name: N/A    Number of Children: 2  . Years of Education: undergrad   Occupational History  . retired    Social History Main Topics  . Smoking status: Former Smoker -- 0.30 packs/day for 2 years    Types: Cigarettes    Quit date: 12/12/1973  . Smokeless tobacco: Never Used  . Alcohol Use: Yes     Comment: rare  . Drug Use: Not on file  . Sexual Activity: Not on file   Other Topics Concern  . Not on file   Social History Narrative   Patient is right handed and resides with son     PHYSICAL EXAM  Filed Vitals:   01/16/14 1540  BP: 127/67  Pulse: 55  Height: 5\' 4"  (1.626 m)  Weight: 92 lb (41.731 kg)   Body mass index is 15.78 kg/(m^2).  Generalized: Well developed, in no acute distress, frail elderly female Head: normocephalic and atraumatic. Oropharynx benign  Neck: Supple, no carotid bruits  Cardiac: Regular rate rhythm, 2/6 murmur  Musculoskeletal: No deformity   Neurological examination  Mentation: Alert oriented to day, not month or year, diminished recall. Follows simple commands only. Speech and language with mild dysfluency. Last MMSE in 2012 was 23/30. Cranial nerve II-XII: Pupils were equal round reactive to light extraocular movements were full, visual field were full on confrontational test. Facial sensation and  strength were normal. hearing was intact to finger rubbing bilaterally. Uvula tongue midline. head turning and shoulder shrug and were normal and symmetric.Tongue protrusion into cheek strength was normal. Motor: normal bulk and tone, full strength in the BUE, BLE, fine finger movements slow, no pronator drift. No focal weakness Sensory: normal and symmetric to light touch Coordination: finger-nose-finger normal Reflexes:  Deep tendon reflexes in the upper and lower extremities are trace.  Gait and Station: Rising up from seated position with moderate difficulty, stooped stance, unsteady gait.  DIAGNOSTIC DATA (LABS, IMAGING, TESTING) - I reviewed patient records, labs, notes, testing and imaging myself where available.  Lab Results  Component Value Date   WBC 7.8 09/13/2013   HGB 10.2* 09/13/2013   HCT 31.0* 09/13/2013  MCV 95.4 09/13/2013   PLT 264 09/13/2013      Component Value Date/Time   NA 135 09/17/2013 1210   K 4.6 09/17/2013 1210   CL 101 09/17/2013 1210   CO2 21 09/17/2013 1210   GLUCOSE 91 09/17/2013 1210   BUN 47* 09/17/2013 1210   CREATININE 2.16* 09/17/2013 1210   CALCIUM 7.9* 09/17/2013 1210   PROT 5.9* 11/26/2013 1229   ALBUMIN 2.9* 11/26/2013 1229   AST 30 11/26/2013 1229   ALT 23 11/26/2013 1229   ALKPHOS 95 11/26/2013 1229   BILITOT 0.6 11/26/2013 1229   GFRNONAA 20* 09/17/2013 1210   GFRAA 23* 09/17/2013 1210   Lab Results  Component Value Date   CHOL 215* 08/30/2013   HDL 87 08/30/2013   LDLCALC 111* 08/30/2013   TRIG 85 08/30/2013   CHOLHDL 2.5 08/30/2013   Lab Results  Component Value Date   HGBA1C 5.9* 08/30/2013    ASSESSMENT AND PLAN 80 y.o. year old frail elderly Caucasian female  has a past medical history of Dementia; Hypertension; Ulcer; Stomach cancer; Snoring; Fracture, intertrochanteric, left femur (03/25/2012); Fracture, intertrochanteric, right femur (06/2008); Nodule of left lung; Paroxysmal atrial fibrillation; and Tachycardia-bradycardia. here with  follow up for left MCA stroke.  She walks with son's assistance but is fearful of using her walker.  PLAN: Continue warfarin  for secondary stroke prevention and maintain strict control of hypertension with blood pressure goal below 130/90, diabetes with hemoglobin A1c goal below 6.5% and lipids with LDL cholesterol goal below 100 mg/dL.  We will refer for more Physical Therapy to increase strength, gait and balance. A new speech therapy evaluation is ordered, to evaluation dysphagia, risk for aspiration (eval need for electric bed). Followup in 6 months. Visit 30 min.  Orders Placed This Encounter  Procedures  . Ambulatory referral to Physical Therapy  . Ambulatory referral to Speech Therapy   Rudi Rummage Alyaan Budzynski, MSN, NP-C 01/16/2014, 5:00 PM [facility] [address] [place], [place] [address] [phone]  Note: This document was prepared with digital dictation and possible smart phrase technology. Any transcriptional errors that result from this process are unintentional.

## 2014-01-16 NOTE — Patient Instructions (Addendum)
Continue warfarin  for secondary stroke prevention and maintain strict control of hypertension with blood pressure goal below 130/90, diabetes with hemoglobin A1c goal below 6.5% and lipids with LDL cholesterol goal below 100 mg/dL.   We will refer for more Physical Therapy to increase strength, gait and balance.  A new speech therapy evaluation is ordered. Followup in 6 months.

## 2014-01-16 NOTE — Telephone Encounter (Signed)
Dr L, this was sent to you at [facility], not to you here at [facility]? Cardiologist had been Rxing the amiodarone and pt's son is trying to find a new PCP for pt, but have you been seeing the pt somewhere else and want to RF? Please advise.

## 2014-01-17 ENCOUNTER — Other Ambulatory Visit: Payer: Self-pay | Admitting: Internal Medicine

## 2014-01-17 NOTE — Telephone Encounter (Signed)
Patient has an appt on 01/16/14 for evaluation for hospital bed.

## 2014-01-20 ENCOUNTER — Other Ambulatory Visit: Payer: Self-pay | Admitting: Physician Assistant

## 2014-01-21 ENCOUNTER — Other Ambulatory Visit: Payer: Self-pay

## 2014-01-21 MED ORDER — ATORVASTATIN CALCIUM 40 MG PO TABS: ORAL_TABLET | ORAL | Status: DC

## 2014-01-22 ENCOUNTER — Other Ambulatory Visit: Payer: Self-pay | Admitting: Physician Assistant

## 2014-01-29 ENCOUNTER — Other Ambulatory Visit: Payer: Self-pay

## 2014-01-29 ENCOUNTER — Ambulatory Visit: Payer: Self-pay | Admitting: Internal Medicine

## 2014-01-30 ENCOUNTER — Other Ambulatory Visit: Payer: Self-pay | Admitting: Internal Medicine

## 2014-01-30 ENCOUNTER — Other Ambulatory Visit: Payer: Self-pay | Admitting: *Deleted

## 2014-01-30 ENCOUNTER — Telehealth: Payer: Self-pay | Admitting: *Deleted

## 2014-01-30 ENCOUNTER — Other Ambulatory Visit: Payer: BLUE CROSS/BLUE SHIELD

## 2014-01-30 MED ORDER — WARFARIN SODIUM 5 MG PO TABS: ORAL_TABLET | ORAL | Status: DC

## 2014-01-30 NOTE — Telephone Encounter (Signed)
Pharmacy requests coumadin refill for patient. Thanks, MI

## 2014-01-31 ENCOUNTER — Other Ambulatory Visit: Payer: Medicare Other

## 2014-02-04 ENCOUNTER — Encounter: Payer: Self-pay | Admitting: Nurse Practitioner

## 2014-02-04 ENCOUNTER — Other Ambulatory Visit: Payer: Self-pay | Admitting: Nurse Practitioner

## 2014-02-04 DIAGNOSIS — R1310 Dysphagia, unspecified: Secondary | ICD-10-CM

## 2014-02-04 DIAGNOSIS — F0390 Unspecified dementia without behavioral disturbance: Secondary | ICD-10-CM

## 2014-02-05 ENCOUNTER — Telehealth: Payer: Self-pay | Admitting: Pulmonary Disease

## 2014-02-05 NOTE — Telephone Encounter (Signed)
lmomtcb x1 for pt son Pt over due for appt w/ RA.

## 2014-02-06 ENCOUNTER — Other Ambulatory Visit (HOSPITAL_COMMUNITY): Payer: Self-pay | Admitting: Nurse Practitioner

## 2014-02-06 DIAGNOSIS — R1310 Dysphagia, unspecified: Secondary | ICD-10-CM

## 2014-02-07 NOTE — Telephone Encounter (Signed)
lmomtcb x2 for pt son

## 2014-02-10 ENCOUNTER — Ambulatory Visit (HOSPITAL_COMMUNITY): Payer: Medicare Other

## 2014-02-10 ENCOUNTER — Inpatient Hospital Stay (HOSPITAL_COMMUNITY): Admission: RE | Admit: 2014-02-10 | Payer: Self-pay | Source: Ambulatory Visit

## 2014-02-11 NOTE — Telephone Encounter (Signed)
[id number]. [name], CMA  

## 2014-02-12 NOTE — Telephone Encounter (Signed)
Per protocol I will sign off and await call back. Eek Bing, CMA

## 2014-02-17 ENCOUNTER — Ambulatory Visit (INDEPENDENT_AMBULATORY_CARE_PROVIDER_SITE_OTHER): Payer: Medicare Other | Admitting: *Deleted

## 2014-02-17 ENCOUNTER — Ambulatory Visit (INDEPENDENT_AMBULATORY_CARE_PROVIDER_SITE_OTHER): Payer: Medicare Other | Admitting: Internal Medicine

## 2014-02-17 ENCOUNTER — Telehealth: Payer: Self-pay | Admitting: Physician Assistant

## 2014-02-17 ENCOUNTER — Encounter: Payer: Self-pay | Admitting: Internal Medicine

## 2014-02-17 VITALS — BP 110/58 | HR 58 | Ht 64.0 in | Wt 93.0 lb

## 2014-02-17 DIAGNOSIS — F0390 Unspecified dementia without behavioral disturbance: Secondary | ICD-10-CM

## 2014-02-17 DIAGNOSIS — I6350 Cerebral infarction due to unspecified occlusion or stenosis of unspecified cerebral artery: Secondary | ICD-10-CM

## 2014-02-17 DIAGNOSIS — I6340 Cerebral infarction due to embolism of unspecified cerebral artery: Secondary | ICD-10-CM

## 2014-02-17 DIAGNOSIS — I4891 Unspecified atrial fibrillation: Secondary | ICD-10-CM

## 2014-02-17 DIAGNOSIS — I639 Cerebral infarction, unspecified: Secondary | ICD-10-CM

## 2014-02-17 DIAGNOSIS — I10 Essential (primary) hypertension: Secondary | ICD-10-CM

## 2014-02-17 DIAGNOSIS — I495 Sick sinus syndrome: Secondary | ICD-10-CM

## 2014-02-17 DIAGNOSIS — Z7901 Long term (current) use of anticoagulants: Secondary | ICD-10-CM

## 2014-02-17 LAB — POCT INR: INR: 8

## 2014-02-17 LAB — PROTIME-INR
INR: 4.96 — AB (ref <1.50)
PROTHROMBIN TIME: 44.5 s — AB (ref 11.6–15.2)

## 2014-02-17 NOTE — Telephone Encounter (Signed)
   Returned a phone call from Shirlean Mylar from [facility] about critical INR value of 4.96, PTT 44.5. Coumadin clinic note from today documented a INR of 8 and patient was instructed to hold their coumadin. At that time there was no evidence bleeding. Will forward information to coumadin clinic for further instructions.   Perry Mount PA-C  [facility]

## 2014-02-17 NOTE — Patient Instructions (Signed)
Your physician recommends that you schedule a follow-up appointment in: 4 months with [name], PA and 12 months with Dr Rayann Heman  Will need to call and get set up with a PCP

## 2014-02-18 ENCOUNTER — Inpatient Hospital Stay (HOSPITAL_COMMUNITY)
Admission: EM | Admit: 2014-02-18 | Discharge: 2014-03-04 | DRG: 356 | Disposition: A | Discharge status: Skilled Nursing Facility | Payer: Medicare Other | Attending: Internal Medicine | Admitting: Internal Medicine

## 2014-02-18 ENCOUNTER — Emergency Department (HOSPITAL_COMMUNITY): Payer: Medicare Other

## 2014-02-18 ENCOUNTER — Inpatient Hospital Stay (HOSPITAL_COMMUNITY): Payer: Medicare Other

## 2014-02-18 ENCOUNTER — Encounter (HOSPITAL_COMMUNITY): Payer: Self-pay | Admitting: Emergency Medicine

## 2014-02-18 DIAGNOSIS — R471 Dysarthria and anarthria: Secondary | ICD-10-CM

## 2014-02-18 DIAGNOSIS — R1310 Dysphagia, unspecified: Secondary | ICD-10-CM

## 2014-02-18 DIAGNOSIS — I495 Sick sinus syndrome: Secondary | ICD-10-CM | POA: Diagnosis present

## 2014-02-18 DIAGNOSIS — N2581 Secondary hyperparathyroidism of renal origin: Secondary | ICD-10-CM | POA: Diagnosis present

## 2014-02-18 DIAGNOSIS — D631 Anemia in chronic kidney disease: Secondary | ICD-10-CM | POA: Diagnosis present

## 2014-02-18 DIAGNOSIS — N200 Calculus of kidney: Secondary | ICD-10-CM | POA: Diagnosis present

## 2014-02-18 DIAGNOSIS — N184 Chronic kidney disease, stage 4 (severe): Secondary | ICD-10-CM

## 2014-02-18 DIAGNOSIS — D689 Coagulation defect, unspecified: Secondary | ICD-10-CM

## 2014-02-18 DIAGNOSIS — Z7901 Long term (current) use of anticoagulants: Secondary | ICD-10-CM

## 2014-02-18 DIAGNOSIS — D649 Anemia, unspecified: Secondary | ICD-10-CM

## 2014-02-18 DIAGNOSIS — Z992 Dependence on renal dialysis: Secondary | ICD-10-CM

## 2014-02-18 DIAGNOSIS — E46 Unspecified protein-calorie malnutrition: Secondary | ICD-10-CM | POA: Diagnosis present

## 2014-02-18 DIAGNOSIS — E7800 Pure hypercholesterolemia, unspecified: Secondary | ICD-10-CM | POA: Diagnosis present

## 2014-02-18 DIAGNOSIS — L89109 Pressure ulcer of unspecified part of back, unspecified stage: Secondary | ICD-10-CM | POA: Diagnosis present

## 2014-02-18 DIAGNOSIS — A498 Other bacterial infections of unspecified site: Secondary | ICD-10-CM | POA: Diagnosis present

## 2014-02-18 DIAGNOSIS — Z8711 Personal history of peptic ulcer disease: Secondary | ICD-10-CM

## 2014-02-18 DIAGNOSIS — I10 Essential (primary) hypertension: Secondary | ICD-10-CM | POA: Diagnosis present

## 2014-02-18 DIAGNOSIS — R911 Solitary pulmonary nodule: Secondary | ICD-10-CM

## 2014-02-18 DIAGNOSIS — K922 Gastrointestinal hemorrhage, unspecified: Secondary | ICD-10-CM | POA: Diagnosis present

## 2014-02-18 DIAGNOSIS — N189 Chronic kidney disease, unspecified: Secondary | ICD-10-CM

## 2014-02-18 DIAGNOSIS — I639 Cerebral infarction, unspecified: Secondary | ICD-10-CM

## 2014-02-18 DIAGNOSIS — E032 Hypothyroidism due to medicaments and other exogenous substances: Secondary | ICD-10-CM

## 2014-02-18 DIAGNOSIS — N390 Urinary tract infection, site not specified: Secondary | ICD-10-CM

## 2014-02-18 DIAGNOSIS — N039 Chronic nephritic syndrome with unspecified morphologic changes: Secondary | ICD-10-CM

## 2014-02-18 DIAGNOSIS — K909 Intestinal malabsorption, unspecified: Secondary | ICD-10-CM | POA: Diagnosis present

## 2014-02-18 DIAGNOSIS — N186 End stage renal disease: Secondary | ICD-10-CM | POA: Diagnosis not present

## 2014-02-18 DIAGNOSIS — E869 Volume depletion, unspecified: Secondary | ICD-10-CM | POA: Diagnosis present

## 2014-02-18 DIAGNOSIS — K921 Melena: Principal | ICD-10-CM | POA: Diagnosis present

## 2014-02-18 DIAGNOSIS — Z96649 Presence of unspecified artificial hip joint: Secondary | ICD-10-CM

## 2014-02-18 DIAGNOSIS — E8809 Other disorders of plasma-protein metabolism, not elsewhere classified: Secondary | ICD-10-CM | POA: Diagnosis present

## 2014-02-18 DIAGNOSIS — N179 Acute kidney failure, unspecified: Secondary | ICD-10-CM | POA: Diagnosis present

## 2014-02-18 DIAGNOSIS — F0390 Unspecified dementia without behavioral disturbance: Secondary | ICD-10-CM

## 2014-02-18 DIAGNOSIS — Z87891 Personal history of nicotine dependence: Secondary | ICD-10-CM

## 2014-02-18 DIAGNOSIS — Z8673 Personal history of transient ischemic attack (TIA), and cerebral infarction without residual deficits: Secondary | ICD-10-CM

## 2014-02-18 DIAGNOSIS — Z515 Encounter for palliative care: Secondary | ICD-10-CM

## 2014-02-18 DIAGNOSIS — R791 Abnormal coagulation profile: Secondary | ICD-10-CM | POA: Diagnosis present

## 2014-02-18 DIAGNOSIS — R531 Weakness: Secondary | ICD-10-CM

## 2014-02-18 DIAGNOSIS — Z79899 Other long term (current) drug therapy: Secondary | ICD-10-CM

## 2014-02-18 DIAGNOSIS — I120 Hypertensive chronic kidney disease with stage 5 chronic kidney disease or end stage renal disease: Secondary | ICD-10-CM | POA: Diagnosis not present

## 2014-02-18 DIAGNOSIS — D62 Acute posthemorrhagic anemia: Secondary | ICD-10-CM | POA: Diagnosis present

## 2014-02-18 DIAGNOSIS — R32 Unspecified urinary incontinence: Secondary | ICD-10-CM | POA: Diagnosis present

## 2014-02-18 DIAGNOSIS — G9349 Other encephalopathy: Secondary | ICD-10-CM | POA: Diagnosis not present

## 2014-02-18 DIAGNOSIS — M25519 Pain in unspecified shoulder: Secondary | ICD-10-CM | POA: Diagnosis not present

## 2014-02-18 DIAGNOSIS — R5381 Other malaise: Secondary | ICD-10-CM | POA: Diagnosis not present

## 2014-02-18 DIAGNOSIS — Z681 Body mass index (BMI) 19 or less, adult: Secondary | ICD-10-CM

## 2014-02-18 DIAGNOSIS — Z85028 Personal history of other malignant neoplasm of stomach: Secondary | ICD-10-CM

## 2014-02-18 DIAGNOSIS — I4891 Unspecified atrial fibrillation: Secondary | ICD-10-CM | POA: Diagnosis present

## 2014-02-18 DIAGNOSIS — L8995 Pressure ulcer of unspecified site, unstageable: Secondary | ICD-10-CM | POA: Diagnosis present

## 2014-02-18 LAB — RETICULOCYTES
RBC.: 2.04 MIL/uL — ABNORMAL LOW (ref 3.87–5.11)
RETIC CT PCT: 2.7 % (ref 0.4–3.1)
Retic Count, Absolute: 55.1 10*3/uL (ref 19.0–186.0)

## 2014-02-18 LAB — BASIC METABOLIC PANEL
BUN: 116 mg/dL — AB (ref 6–23)
CALCIUM: 7 mg/dL — AB (ref 8.4–10.5)
CO2: 25 mEq/L (ref 19–32)
Chloride: 88 mEq/L — ABNORMAL LOW (ref 96–112)
Creatinine, Ser: 7.87 mg/dL — ABNORMAL HIGH (ref 0.50–1.10)
GFR calc Af Amer: 5 mL/min — ABNORMAL LOW (ref >90)
GFR calc non Af Amer: 4 mL/min — ABNORMAL LOW (ref >90)
GLUCOSE: 102 mg/dL — AB (ref 70–99)
Potassium: 3.8 mEq/L (ref 3.7–5.3)
SODIUM: 136 meq/L — AB (ref 137–147)

## 2014-02-18 LAB — CBC WITH DIFFERENTIAL/PLATELET
Basophils Absolute: 0 10*3/uL (ref 0.0–0.1)
Basophils Relative: 0 % (ref 0–1)
EOS ABS: 0 10*3/uL (ref 0.0–0.7)
EOS PCT: 0 % (ref 0–5)
HEMATOCRIT: 18.4 % — AB (ref 36.0–46.0)
Hemoglobin: 6.2 g/dL — CL (ref 12.0–15.0)
LYMPHS ABS: 0.6 10*3/uL — AB (ref 0.7–4.0)
Lymphocytes Relative: 7 % — ABNORMAL LOW (ref 12–46)
MCH: 31 pg (ref 26.0–34.0)
MCHC: 33.7 g/dL (ref 30.0–36.0)
MCV: 92 fL (ref 78.0–100.0)
MONO ABS: 0.5 10*3/uL (ref 0.1–1.0)
MONOS PCT: 6 % (ref 3–12)
Neutro Abs: 7.5 10*3/uL (ref 1.7–7.7)
Neutrophils Relative %: 87 % — ABNORMAL HIGH (ref 43–77)
PLATELETS: 239 10*3/uL (ref 150–400)
RBC: 2 MIL/uL — AB (ref 3.87–5.11)
RDW: 13.9 % (ref 11.5–15.5)
WBC: 8.6 10*3/uL (ref 4.0–10.5)

## 2014-02-18 LAB — MRSA PCR SCREENING: MRSA by PCR: NEGATIVE

## 2014-02-18 LAB — TSH: TSH: 7.08 u[IU]/mL — AB (ref 0.35–5.50)

## 2014-02-18 LAB — VITAMIN B12: Vitamin B-12: 1915 pg/mL — ABNORMAL HIGH (ref 211–911)

## 2014-02-18 LAB — URINALYSIS, ROUTINE W REFLEX MICROSCOPIC
BILIRUBIN URINE: NEGATIVE
Glucose, UA: NEGATIVE mg/dL
Ketones, ur: NEGATIVE mg/dL
Nitrite: NEGATIVE
Protein, ur: 100 mg/dL — AB
SPECIFIC GRAVITY, URINE: 1.013 (ref 1.005–1.030)
Urobilinogen, UA: 0.2 mg/dL (ref 0.0–1.0)
pH: 7.5 (ref 5.0–8.0)

## 2014-02-18 LAB — HEPATIC FUNCTION PANEL
ALBUMIN: 2.3 g/dL — AB (ref 3.5–5.2)
ALK PHOS: 105 U/L (ref 39–117)
ALT: 42 U/L — ABNORMAL HIGH (ref 0–35)
AST: 58 U/L — AB (ref 0–37)
BILIRUBIN DIRECT: 0.2 mg/dL (ref 0.0–0.3)
BILIRUBIN TOTAL: 0.8 mg/dL (ref 0.3–1.2)
Total Protein: 5.7 g/dL — ABNORMAL LOW (ref 6.0–8.3)

## 2014-02-18 LAB — URINE MICROSCOPIC-ADD ON

## 2014-02-18 LAB — IRON AND TIBC
IRON: 108 ug/dL (ref 42–135)
Saturation Ratios: 74 % — ABNORMAL HIGH (ref 20–55)
TIBC: 146 ug/dL — ABNORMAL LOW (ref 250–470)
UIBC: 38 ug/dL — AB (ref 125–400)

## 2014-02-18 LAB — FOLATE: Folate: >20 ng/mL

## 2014-02-18 LAB — PROTIME-INR
INR: 5.96 (ref 0.00–1.49)
PROTHROMBIN TIME: 50.8 s — AB (ref 11.6–15.2)

## 2014-02-18 LAB — SODIUM, URINE, RANDOM: Sodium, Ur: 80 mEq/L

## 2014-02-18 LAB — HEMOGLOBIN AND HEMATOCRIT, BLOOD
HCT: 23.9 % — ABNORMAL LOW (ref 36.0–46.0)
HEMOGLOBIN: 8.2 g/dL — AB (ref 12.0–15.0)

## 2014-02-18 LAB — CREATININE, URINE, RANDOM: CREATININE, URINE: 30.24 mg/dL

## 2014-02-18 LAB — POC OCCULT BLOOD, ED: Fecal Occult Bld: POSITIVE — AB

## 2014-02-18 LAB — PREPARE RBC (CROSSMATCH)

## 2014-02-18 MED ORDER — ONDANSETRON HCL 4 MG PO TABS
4.0000 mg | ORAL_TABLET | Freq: Four times a day (QID) | ORAL | Status: DC | PRN
Start: 2014-02-18 — End: 2014-03-04

## 2014-02-18 MED ORDER — DEXTROSE 5 % IV SOLN
1.0000 g | INTRAVENOUS | Status: DC
  Administered 2014-02-18 – 2014-02-23 (×5): 1 g via INTRAVENOUS
  Filled 2014-02-18 (×6): qty 10

## 2014-02-18 MED ORDER — DARBEPOETIN ALFA-POLYSORBATE 100 MCG/0.5ML IJ SOLN
100.0000 ug | INTRAMUSCULAR | Status: DC
  Administered 2014-02-19: 100 ug via SUBCUTANEOUS
  Filled 2014-02-18: qty 0.5

## 2014-02-18 MED ORDER — AMIODARONE HCL 100 MG PO TABS
100.0000 mg | ORAL_TABLET | Freq: Every day | ORAL | Status: DC
  Administered 2014-02-19 – 2014-03-04 (×13): 100 mg via ORAL
  Filled 2014-02-18 (×15): qty 1

## 2014-02-18 MED ORDER — ACETAMINOPHEN 650 MG RE SUPP: 650.0000 mg | Freq: Four times a day (QID) | RECTAL | Status: DC | PRN

## 2014-02-18 MED ORDER — DEXTROSE 5 % IV SOLN
10.0000 mg | Freq: Once | INTRAVENOUS | Status: AC
  Administered 2014-02-18: 10 mg via INTRAVENOUS
  Filled 2014-02-18: qty 1

## 2014-02-18 MED ORDER — SODIUM CHLORIDE 0.9 % IV SOLN
INTRAVENOUS | Status: DC
  Administered 2014-02-18 – 2014-02-24 (×6): via INTRAVENOUS

## 2014-02-18 MED ORDER — CALCIUM ACETATE 667 MG PO CAPS
1334.0000 mg | ORAL_CAPSULE | Freq: Three times a day (TID) | ORAL | Status: DC
  Administered 2014-02-19 – 2014-03-03 (×24): 1334 mg via ORAL
  Filled 2014-02-18 (×41): qty 2

## 2014-02-18 MED ORDER — CALCITRIOL 0.25 MCG PO CAPS
0.2500 ug | ORAL_CAPSULE | Freq: Every day | ORAL | Status: DC
  Administered 2014-02-19 – 2014-02-22 (×4): 0.25 ug via ORAL
  Filled 2014-02-18 (×6): qty 1

## 2014-02-18 MED ORDER — LEVOTHYROXINE SODIUM 25 MCG PO TABS
25.0000 ug | ORAL_TABLET | Freq: Every day | ORAL | Status: DC
  Administered 2014-02-19 – 2014-03-04 (×12): 25 ug via ORAL
  Filled 2014-02-18 (×18): qty 1

## 2014-02-18 MED ORDER — SODIUM CHLORIDE 0.9 % IV SOLN
8.0000 mg/h | INTRAVENOUS | Status: DC
  Administered 2014-02-18 – 2014-02-20 (×3): 8 mg/h via INTRAVENOUS
  Filled 2014-02-18 (×9): qty 80

## 2014-02-18 MED ORDER — ONDANSETRON HCL 4 MG/2ML IJ SOLN
4.0000 mg | Freq: Four times a day (QID) | INTRAMUSCULAR | Status: DC | PRN
  Administered 2014-02-22: 4 mg via INTRAVENOUS

## 2014-02-18 MED ORDER — ACETAMINOPHEN 325 MG PO TABS
650.0000 mg | ORAL_TABLET | Freq: Four times a day (QID) | ORAL | Status: DC | PRN
  Administered 2014-02-19 – 2014-02-27 (×9): 650 mg via ORAL
  Filled 2014-02-18 (×9): qty 2

## 2014-02-18 MED ORDER — SODIUM BICARBONATE 650 MG PO TABS
650.0000 mg | ORAL_TABLET | Freq: Four times a day (QID) | ORAL | Status: DC
  Administered 2014-02-19 – 2014-02-22 (×16): 650 mg via ORAL
  Filled 2014-02-18 (×22): qty 1

## 2014-02-18 MED ORDER — SODIUM CHLORIDE 0.9 % IV SOLN
80.0000 mg | Freq: Once | INTRAVENOUS | Status: AC
  Administered 2014-02-18: 80 mg via INTRAVENOUS
  Filled 2014-02-18: qty 80

## 2014-02-18 NOTE — ED Notes (Addendum)
Pt in ultrasound at the time. Vital signs stable. No signs of acute distress noted.

## 2014-02-18 NOTE — Consult Note (Addendum)
Reason for Consult: Acute renal failure on chronic kidney disease stage IV Referring Physician: Niel Hummer MD ([facility])  HPI:   80 year old Caucasian woman with past medical history significant for chronic kidney disease stage IV that appears to be from previous heavy nonsteroidal anti-inflammatory drug use/interstitial nephritis with a baseline creatinine of about 2.2-2.4. Seen yesterday as part of routine followup and labs were drawn that were found to be significant for an elevated creatinine of 7.8, BUN of 122 and hemoglobin of 7.2 g/dL (off Procrit for the past 5 months). This prompted referral of the patient to the emergency room for further evaluation and management.  In the emergency room, the patient was found to have supratherapeutic INR (6) with lower hemoglobin of 6.2 g/dL and Hemoccult positive stools with a history of dark-colored (brown-black) stools. She has been admitted for further management of acute renal failure on CKD stage IV as well as GI bleed/acute blood loss anemia. Prior to hospitalization, was on furosemide 40 mg every morning and 20 mg every afternoon. Denies any nonsteroidal anti-inflammatory drugs and has not had any recent intravenous contrast exposure. She is not on ACE inhibitor or ARB. Her son reports a preceding history of "fatigue and poor appetite" for the last 6-8 weeks that followed the death of a close friend/neighbor of the patient's. No recent skin rash, sinusitis-like episode or epistaxis.  Based on previous notes from Dr.Deterding-she's felt to be a poor hemodialysis candidate (although the son begs to differ stating that Ms.Willden has the desire to live longer).   Past Medical History  Diagnosis Date  . Dementia   . Hypertension   . Ulcer     Hx of  . Stomach cancer   . Snoring   . Fracture, intertrochanteric, left femur 03/25/2012  . Fracture, intertrochanteric, right femur 06/2008    s/p closed reduction and internal fixation   . Nodule of left lung     followed by Dr. Elsworth Soho // LLL spiculated nodule noted per CT in 2012 1.5x1.6x2.4, PET in 06/03/2011 was low uptake.  . Paroxysmal atrial fibrillation   . Tachycardia-bradycardia     Past Surgical History  Procedure Laterality Date  . Stomach surgery      3/4 of stomach removed due to stomach cancer  . Hip surgery  2009    right  . Total hip arthroplasty  03/29/2012    Procedure: TOTAL HIP ARTHROPLASTY;  Surgeon: Johnny Bridge, MD;  Location: [place];  Service: Orthopedics;  Laterality: Left;  Left Hip Hemiarthroplasty    Family History  Problem Relation Age of Onset  . Cancer Father   . Liver cancer Son   . Allergies      Social History:  reports that she quit smoking about 40 years ago. Her smoking use included Cigarettes. She has a .6 pack-year smoking history. She has never used smokeless tobacco. She reports that she drinks alcohol. Her drug history is not on file.  Allergies: No Known Allergies  Medications: . amiodarone  100 mg Oral Daily  . calcitRIOL  0.25 mcg Oral Daily  . cefTRIAXone (ROCEPHIN)  IV  1 g Intravenous Q24H  . [START ON 02/19/2014] levothyroxine  25 mcg Oral QAC breakfast  . sodium bicarbonate  650 mg Oral QID     Results for orders placed during the hospital encounter of 02/18/14 (from the past 48 hour(s))  CBC WITH DIFFERENTIAL     Status: Abnormal   Collection Time    02/18/14 12:05 PM  Result Value Ref Range   WBC 8.6  4.0 - 10.5 K/uL   RBC 2.00 (*) 3.87 - 5.11 MIL/uL   Hemoglobin 6.2 (*) 12.0 - 15.0 g/dL   Comment: REPEATED TO VERIFY     CRITICAL RESULT CALLED TO, READ BACK BY AND VERIFIED WITH:     Carmelia Roller RN [id number] 02/18/2014 BY MACEDA, J   HCT 18.4 (*) 36.0 - 46.0 %   MCV 92.0  78.0 - 100.0 fL   MCH 31.0  26.0 - 34.0 pg   MCHC 33.7  30.0 - 36.0 g/dL   RDW 13.9  11.5 - 15.5 %   Platelets 239  150 - 400 K/uL   Neutrophils Relative % 87 (*) 43 - 77 %   Neutro Abs 7.5  1.7 - 7.7 K/uL   Lymphocytes Relative  7 (*) 12 - 46 %   Lymphs Abs 0.6 (*) 0.7 - 4.0 K/uL   Monocytes Relative 6  3 - 12 %   Monocytes Absolute 0.5  0.1 - 1.0 K/uL   Eosinophils Relative 0  0 - 5 %   Eosinophils Absolute 0.0  0.0 - 0.7 K/uL   Basophils Relative 0  0 - 1 %   Basophils Absolute 0.0  0.0 - 0.1 K/uL  BASIC METABOLIC PANEL     Status: Abnormal   Collection Time    02/18/14 12:05 PM      Result Value Ref Range   Sodium 136 (*) 137 - 147 mEq/L   Potassium 3.8  3.7 - 5.3 mEq/L   Chloride 88 (*) 96 - 112 mEq/L   CO2 25  19 - 32 mEq/L   Glucose, Bld 102 (*) 70 - 99 mg/dL   BUN 116 (*) 6 - 23 mg/dL   Creatinine, Ser 7.87 (*) 0.50 - 1.10 mg/dL   Calcium 7.0 (*) 8.4 - 10.5 mg/dL   GFR calc non Af Amer 4 (*) >90 mL/min   GFR calc Af Amer 5 (*) >90 mL/min   Comment: (NOTE)     The eGFR has been calculated using the CKD EPI equation.     This calculation has not been validated in all clinical situations.     eGFR's persistently <90 mL/min signify possible Chronic Kidney     Disease.  URINALYSIS, ROUTINE W REFLEX MICROSCOPIC     Status: Abnormal   Collection Time    02/18/14 12:59 PM      Result Value Ref Range   Color, Urine YELLOW  YELLOW   APPearance TURBID (*) CLEAR   Specific Gravity, Urine 1.013  1.005 - 1.030   pH 7.5  5.0 - 8.0   Glucose, UA NEGATIVE  NEGATIVE mg/dL   Hgb urine dipstick MODERATE (*) NEGATIVE   Bilirubin Urine NEGATIVE  NEGATIVE   Ketones, ur NEGATIVE  NEGATIVE mg/dL   Protein, ur 100 (*) NEGATIVE mg/dL   Urobilinogen, UA 0.2  0.0 - 1.0 mg/dL   Nitrite NEGATIVE  NEGATIVE   Leukocytes, UA LARGE (*) NEGATIVE  URINE MICROSCOPIC-ADD ON     Status: Abnormal   Collection Time    02/18/14 12:59 PM      Result Value Ref Range   Squamous Epithelial / LPF FEW (*) RARE   WBC, UA TOO NUMEROUS TO COUNT  <3 WBC/hpf   RBC / HPF 3-6  <3 RBC/hpf   Bacteria, UA MANY (*) RARE  SODIUM, URINE, RANDOM     Status: None   Collection Time    02/18/14 12:59  PM      Result Value Ref Range   Sodium, Ur  80    CREATININE, URINE, RANDOM     Status: None   Collection Time    02/18/14 12:59 PM      Result Value Ref Range   Creatinine, Urine 30.24    TYPE AND SCREEN     Status: None   Collection Time    02/18/14  1:22 PM      Result Value Ref Range   ABO/RH(D) B POS     Antibody Screen NEG     Sample Expiration 02/21/2014     Unit Number [id number]     Blood Component Type RED CELLS,LR     Unit division 00     Status of Unit ALLOCATED     Transfusion Status OK TO TRANSFUSE     Crossmatch Result Compatible     Unit Number [id number]     Blood Component Type RED CELLS,LR     Unit division 00     Status of Unit ISSUED     Transfusion Status OK TO TRANSFUSE     Crossmatch Result Compatible    PREPARE RBC (CROSSMATCH)     Status: None   Collection Time    02/18/14  1:22 PM      Result Value Ref Range   Order Confirmation ORDER PROCESSED BY BLOOD BANK    RETICULOCYTES     Status: Abnormal   Collection Time    02/18/14  1:37 PM      Result Value Ref Range   Retic Ct Pct 2.7  0.4 - 3.1 %   RBC. 2.04 (*) 3.87 - 5.11 MIL/uL   Retic Count, Manual 55.1  19.0 - 186.0 K/uL  PROTIME-INR     Status: Abnormal   Collection Time    02/18/14  2:00 PM      Result Value Ref Range   Prothrombin Time 50.8 (*) 11.6 - 15.2 seconds   INR 5.96 (*) 0.00 - 1.49   Comment: REPEATED TO VERIFY     CRITICAL RESULT CALLED TO, READ BACK BY AND VERIFIED WITH:     Carmelia Roller RN [id number] 02/18/14 A BROWNING  POC OCCULT BLOOD, ED     Status: Abnormal   Collection Time    02/18/14  2:08 PM      Result Value Ref Range   Fecal Occult Bld POSITIVE (*) NEGATIVE  PREPARE FRESH FROZEN PLASMA     Status: None   Collection Time    02/18/14  3:30 PM      Result Value Ref Range   Unit Number [id number]     Blood Component Type THAWED PLASMA     Unit division 00     Status of Unit ALLOCATED     Transfusion Status OK TO TRANSFUSE     Unit Number [id number]     Blood Component Type THAWED PLASMA     Unit  division 00     Status of Unit ALLOCATED     Transfusion Status OK TO TRANSFUSE      US Renal  02/18/2014   CLINICAL DATA Acute on chronic renal failure, history hypertension  EXAM RENAL/URINARY TRACT ULTRASOUND COMPLETE  COMPARISON CT abdomen and pelvis 05/11/2011  FINDINGS Right Kidney:  Length: 8.2 cm. Minimal cortical thinning. Significantly increased cortical echogenicity. Large shadowing calculi within right kidney largest 10 mm diameter inferior pole. No definite mass or hydronephrosis.  Left Kidney:  Length: 8.8 cm. Normal cortical thickness.  Significantly increased cortical echogenicity. No mass, hydronephrosis or shadowing calcification.  Bladder:  Wall appears thickened up to 12 mm thick though this may be in part related to the presence of minimal urine and poor distention. No gross mass lesion.  IMPRESSION Medical renal disease changes without evidence of mass or hydronephrosis.  Nonobstructing right renal calculi to 10 mm diameter.  SIGNATURE  Electronically Signed   By: Lavonia Dana M.D.   On: 02/18/2014 16:59   Dg Chest Port 1 View  02/18/2014   CLINICAL DATA:  Fluid overload.  Abnormal renal labs.  EXAM: PORTABLE CHEST - 1 VIEW  COMPARISON:  DG CHEST 1V PORT dated 08/31/2013; CT CHEST W/O CM dated 03/15/2013  FINDINGS: Cardiopericardial silhouette within normal limits. Mediastinal contours normal. Trachea midline. No airspace disease or effusion. Monitoring leads project over the chest. Bilateral glenohumeral osteoarthritis is present with probable right rotator cuff tear.  IMPRESSION: No active cardiopulmonary disease.   Electronically Signed   By: Dereck Ligas M.D.   On: 02/18/2014 12:34    Review of Systems  Constitutional: Positive for malaise/fatigue. Negative for fever, chills, weight loss and diaphoresis.  HENT: Negative.   Eyes: Negative.   Respiratory: Negative.   Cardiovascular: Positive for leg swelling. Negative for chest pain, palpitations, orthopnea, claudication and  PND.  Gastrointestinal: Negative.        Dark stools but no report of black/tarry stools  Genitourinary: Positive for frequency. Negative for dysuria, urgency and hematuria.       Chronic urinary incontinence  Musculoskeletal: Negative.   Skin: Negative.   Neurological: Positive for weakness. Negative for dizziness, tingling, tremors, sensory change, speech change and seizures.  Psychiatric/Behavioral: Positive for depression and memory loss. Negative for suicidal ideas. The patient is nervous/anxious.   All other systems reviewed and are negative.   Blood pressure 122/48, pulse 60, temperature 98.7 F (37.1 C), temperature source Oral, resp. rate 18, height $RemoveBe'5\' 2"'[id number]$  (1.575 m), weight 41.277 kg (91 lb), SpO2 99.00%. Physical Exam  Nursing note and vitals reviewed. Constitutional: She appears well-developed.  Medium built, cachectic appearing  HENT:  Head: Normocephalic and atraumatic.  Nose: Nose normal.  Dry oral mucosa with crusted lips  Eyes: Conjunctivae and EOM are normal. Pupils are equal, round, and reactive to light. No scleral icterus.  Neck: Normal range of motion. Neck supple. No JVD present. No tracheal deviation present. No thyromegaly present.  Cardiovascular: Exam reveals no friction rub.   Murmur heard. Irregular bradycardia with grade 2/6 outflow holosystolic murmur  Respiratory: Effort normal and breath sounds normal. No respiratory distress. She has no wheezes. She has no rales.  GI: Soft. Bowel sounds are normal. She exhibits no distension. There is no tenderness. There is no rebound.  Musculoskeletal: Normal range of motion. She exhibits edema.  1-2+ right lower extremity edema, 2+ left lower extremity edema  Neurological: She is alert.  Poorly oriented to place/time-fairly oriented to person "you are the doctor"  Skin: Skin is warm and dry. No rash noted. No erythema.  Psychiatric: She has a normal mood and affect.    Assessment/Plan: 1. Acute renal failure on  chronic kidney disease stage IV: No significant casts reported however, multiple WBCs noted that could probably indicate a urinary tract infection (patient has had a history of this in the past with chronic urinary incontinence). A urine sodium is 80 however, difficult to interpret in the face of recent diuretic use. Clinically she appears to be marginally volume depleted with dry oral mucosa  and poor skin turgor, that would suggest volume depletion. Renal ultrasound is consistent with chronic medical renal disease (bilateral renal atrophic/cortical thinning)  and a nonobstructive 10 mm right renal calculus but without any hydronephrosis. Given her concomitant anemia, marginal blood pressures and what appears to be subacute loss-possibly hemodynamically mediated injury with prerenal azotemia versus ATN to her kidney in the face of ongoing diuretic therapy. Agree with packed red cell transfusion to expand her intravascular volume and improve renal perfusion followed by isotonic fluids with close monitoring to avoid volume overload. No indication that this is a glomerulonephritis and serologies will not be checked at this time. No acute dialysis needs at this time-I. had a long discussion with the patient and her son regarding her poor baseline status to start dialysis and the likelihood that this might hasten her overall decline. Avoid nephrotoxic medications including NSAIDs and contrast exposure. Continue strict Input and Output monitoring. Will monitor the patient closely with you and intervene or adjust therapy as indicated by changes in clinical status/labs 2. Anemia with gastrointestinal bleeding: She likely has underlying chronic anemia of kidney disease (on Procrit therapy) and this has been exacerbated by acute blood loss anemia with GI bleed (may be associated with her supratherapeutic INR). Agree with packed red cell transfusion for volume expansion/correction of anemia. Hospitalist note reviewed,  gastroenterology consultation pending. Start Aranesp. (Last iron saturation was 75% with a ferritin of 1803 on 02/17/2014). 3. Hyperphosphatemia: From the renal excretion defect in chronic kidney disease and likely exacerbated by acute renal failure. Calcium 7.1 (8.3 when corrected for albumin of 2.5), we'll start a calcium-based binder. 4. Pedal edema: Hold furosemide for now and restart with renal recovery to allow for renal perfusion while getting packed red cells/intravenous fluids. Likely exacerbated by her poor oncotic pressure from hypoalbuminemia.  Reygan Heagle K. 02/18/2014, 5:54 PM

## 2014-02-18 NOTE — ED Notes (Signed)
MD at bedside. 

## 2014-02-18 NOTE — H&P (Signed)
[facility] History and Physical  [name] FXT:[id number] DOB: 11-18-1926 DOA: 02/18/2014  Referring physician: Dr Elta Guadeloupe.  PCP: Robyn Haber, MD   Chief Complaint: abnormal lab work.   HPI: [name] is a 80 y.o. female with PMH significant for stroke, stomach cancer, PUD, A fib on coumadin, CKD stage 3 last Cr per our record at 2.16, who was refer to the ED for further evaluation by Dr Deterdine due to worsening renal function ( cr at 7). In the ED patient was found to have hb at 6.2. Guaiac stool positive,  INR at 5.9. On further questioning patient relates melena, once a day for last few weeks. She denies nausea, vomiting, abdominal pain, diarrhea, dysuria. She was notice to be more confuse, weak and lethargic by son.  Patient is alert, she is answering questions, she was oriented to place, time. She recognized her son.  Denies taking ibuprofen, or aspirin.   Review of Systems:  Negative, except as per HPI.   Past Medical History  Diagnosis Date  . Dementia   . Hypertension   . Ulcer     Hx of  . Stomach cancer   . Snoring   . Fracture, intertrochanteric, left femur 03/25/2012  . Fracture, intertrochanteric, right femur 06/2008    s/p closed reduction and internal fixation  . Nodule of left lung     followed by Dr. Elsworth Soho // LLL spiculated nodule noted per CT in 2012 1.5x1.6x2.4, PET in 06/03/2011 was low uptake.  . Paroxysmal atrial fibrillation   . Tachycardia-bradycardia    Past Surgical History  Procedure Laterality Date  . Stomach surgery      3/4 of stomach removed due to stomach cancer  . Hip surgery  2009    right  . Total hip arthroplasty  03/29/2012    Procedure: TOTAL HIP ARTHROPLASTY;  Surgeon: Johnny Bridge, MD;  Location: [place];  Service: Orthopedics;  Laterality: Left;  Left Hip Hemiarthroplasty   Social History:  reports that she quit smoking about 40 years ago. Her smoking use included Cigarettes. She has a .6 pack-year smoking history. She  has never used smokeless tobacco. She reports that she drinks alcohol. Her drug history is not on file.  No Known Allergies  Family History  Problem Relation Age of Onset  . Cancer Father   . Liver cancer Son   . Allergies       Prior to Admission medications   Medication Sig Start Date End Date Taking? Authorizing Provider  amiodarone (PACERONE) 200 MG tablet Take 100 mg by mouth daily. 01/07/14  Yes Thompson Grayer, MD  atorvastatin (LIPITOR) 40 MG tablet Take 40 mg by mouth daily at 6 PM.  01/21/14  Yes Thompson Grayer, MD  calcitRIOL (ROCALTROL) 0.25 MCG capsule Take 0.25 mcg by mouth daily.   Yes Historical Provider, MD  Cyanocobalamin (VITAMIN B 12 PO) Take 1 capsule by mouth 3 (three) times a week.    Yes Historical Provider, MD  furosemide (LASIX) 20 MG tablet Take 20-40 mg by mouth 2 (two) times daily. Takes 2 tablets in am and 1 tablet in pm   Yes Historical Provider, MD  IRON PO Take 1 capsule twice daily (ferrous)   Yes Historical Provider, MD  levothyroxine (SYNTHROID) 25 MCG tablet Take 1 tablet (25 mcg total) by mouth daily before breakfast. 12/20/13  Yes Liliane Shi, PA-C  Multiple Vitamin (MULTIVITAMIN) capsule Take 1 capsule by mouth daily.   Yes Historical Provider, MD  sodium bicarbonate 650 MG tablet Take 650 mg by mouth 4 (four) times daily. 09/17/13  Yes Ivan Anchors Love, PA-C  warfarin (COUMADIN) 5 MG tablet Take 2.5 mg by mouth daily at 6 PM. Take as directed by coumadin clinic 01/30/14  Yes Thompson Grayer, MD   Physical Exam: Filed Vitals:   02/18/14 1500  BP: 122/44  Pulse:   Temp: 98.8 F (37.1 C)  Resp: 18    BP 122/44  Pulse 60  Temp(Src) 98.8 F (37.1 C) (Oral)  Resp 18  Ht 5\' 2"  (1.575 m)  Wt 41.277 kg (91 lb)  BMI 16.64 kg/m2  SpO2 99%  General:  Appears calm and comfortable Eyes: PERRL, normal lids, irises & conjunctiva ENT: grossly normal hearing, lips & tongue Neck: no LAD, masses or thyromegaly Cardiovascular: RRR, no m/r/g. Trace edema.    Telemetry: SR, no arrhythmias  Respiratory: CTA bilaterally, no w/r/r. Normal respiratory effort. Abdomen: soft, ntnd Skin: no rash or induration seen on limited exam Musculoskeletal: grossly normal tone BUE/BLE Psychiatric: grossly normal mood and affect, speech fluent and appropriate           Labs on Admission:  Basic Metabolic Panel:  Recent Labs Lab 02/18/14 1205  NA 136*  K 3.8  CL 88*  CO2 25  GLUCOSE 102*  BUN 116*  CREATININE 7.87*  CALCIUM 7.0*   Liver Function Tests:  Recent Labs Lab 02/17/14 1718  AST 58*  ALT 42*  ALKPHOS 105  BILITOT 0.8  PROT 5.7*  ALBUMIN 2.3*   No results found for this basename: LIPASE, AMYLASE,  in the last 168 hours No results found for this basename: AMMONIA,  in the last 168 hours CBC:  Recent Labs Lab 02/18/14 1205  WBC 8.6  NEUTROABS 7.5  HGB 6.2*  HCT 18.4*  MCV 92.0  PLT 239   Cardiac Enzymes: No results found for this basename: CKTOTAL, CKMB, CKMBINDEX, TROPONINI,  in the last 168 hours  BNP (last 3 results) No results found for this basename: PROBNP,  in the last 8760 hours CBG: No results found for this basename: GLUCAP,  in the last 168 hours  Radiological Exams on Admission: Dg Chest Port 1 View  02/18/2014   CLINICAL DATA:  Fluid overload.  Abnormal renal labs.  EXAM: PORTABLE CHEST - 1 VIEW  COMPARISON:  DG CHEST 1V PORT dated 08/31/2013; CT CHEST W/O CM dated 03/15/2013  FINDINGS: Cardiopericardial silhouette within normal limits. Mediastinal contours normal. Trachea midline. No airspace disease or effusion. Monitoring leads project over the chest. Bilateral glenohumeral osteoarthritis is present with probable right rotator cuff tear.  IMPRESSION: No active cardiopulmonary disease.   Electronically Signed   By: Dereck Ligas M.D.   On: 02/18/2014 12:34    EKG: Independently reviewed.   Assessment/Plan Principal Problem:   Upper GI bleed Active Problems:   Hypertension   Atrial fibrillation    Acute on chronic renal failure   Anemia   UTI (lower urinary tract infection)  1-Anemia, severe. In setting of acute on chronic GI bleed, in setting of supra-therapeutic INR.  Agree with 2 units of  blood transfusion, FFP, vitamin k.  IV protonix Gtt.  GI already consulted by Dr Elta Guadeloupe.  NPO. Cycle Hb, repeat INR tonight.  Hold coumadin.   2-Acute on chronic renal failure; Stage 3. Could be multifactorial, hypoperfusion from anemia, UTI, vs worsening of renal function.  Uremia in setting of worsening renal function and probably upper GI bleed.  Iv fluids. Strict I  and O.  Renal US. Bladder scan.  Renal consulted.    3-UTI; UA with too numerous to count WBC. Urine culture. Start Ceftriaxone.  4-A fib; continue with amiodarone. Hold coumadin.  5-History of stroke; hold coumadin in setting GI Bleed.  6-Supra-therapeutic INR; hold coumadin, FFP, Vitamin K.  7-Transaminases; monitor. Hold statins.    Code Status: full code.  Family Communication: Care discussed with son who was at bedside.  Disposition Plan: expect 3 to 4 days inpatient.   Time spent: 75 minutes.   Kimber Relic [facility] Pager [phone]

## 2014-02-18 NOTE — ED Provider Notes (Signed)
CSN: [id number]     Arrival date & time 02/18/14  1035 History   First MD Initiated Contact with Patient 02/18/14 1144     Chief Complaint  Patient presents with  . Abnormal Lab      HPI  Patient's her with her son. He saw her nephrologist's PA yesterday, at  Dr. Deterding's office.  They received a call today that her labs were abnormal and they were referred here.  Son states "Something about kidney failure".  Pt without complaint.  Still making urine.  Some dependent edema over the last 3-4 wks.  To orthopnea, PND, or SOB.  No cp.  No fever. No bleeding via urine or stool.  Past Medical History  Diagnosis Date  . Dementia   . Hypertension   . Ulcer     Hx of  . Stomach cancer   . Snoring   . Fracture, intertrochanteric, left femur 03/25/2012  . Fracture, intertrochanteric, right femur 06/2008    s/p closed reduction and internal fixation  . Nodule of left lung     followed by Dr. Elsworth Soho // LLL spiculated nodule noted per CT in 2012 1.5x1.6x2.4, PET in 06/03/2011 was low uptake.  . Paroxysmal atrial fibrillation   . Tachycardia-bradycardia    Past Surgical History  Procedure Laterality Date  . Stomach surgery      3/4 of stomach removed due to stomach cancer  . Hip surgery  2009    right  . Total hip arthroplasty  03/29/2012    Procedure: TOTAL HIP ARTHROPLASTY;  Surgeon: Johnny Bridge, MD;  Location: [place] [facility];  Service: Orthopedics;  Laterality: Left;  Left Hip Hemiarthroplasty   Family History  Problem Relation Age of Onset  . Cancer Father   . Liver cancer Son   . Allergies     History  Substance Use Topics  . Smoking status: Former Smoker -- 0.30 packs/day for 2 years    Types: Cigarettes    Quit date: 12/12/1973  . Smokeless tobacco: Never Used  . Alcohol Use: Yes     Comment: rare   OB History   Grav Para Term Preterm Abortions TAB SAB Ect Mult Living                 Review of Systems  Constitutional: Negative for fever, chills, diaphoresis, appetite  change and fatigue.  HENT: Negative for mouth sores, sore throat and trouble swallowing.   Eyes: Negative for visual disturbance.  Respiratory: Negative for cough, chest tightness, shortness of breath and wheezing.   Cardiovascular: Positive for leg swelling. Negative for chest pain.  Gastrointestinal: Negative for nausea, vomiting, abdominal pain, diarrhea and abdominal distention.  Endocrine: Negative for polydipsia, polyphagia and polyuria.  Genitourinary: Negative for dysuria, frequency and hematuria.  Musculoskeletal: Negative for gait problem.  Skin: Negative for color change, pallor and rash.  Neurological: Negative for dizziness, syncope, light-headedness and headaches.  Hematological: Does not bruise/bleed easily.  Psychiatric/Behavioral: Negative for behavioral problems and confusion.      Allergies  Review of patient's allergies indicates no known allergies.  Home Medications   No current outpatient prescriptions on file. BP 128/53  Pulse 63  Temp(Src) 97.4 F (36.3 C) (Oral)  Resp 13  Ht 5\' 2"  (1.575 m)  Wt 101 lb 6.6 oz (46 kg)  BMI 18.54 kg/m2  SpO2 96% Physical Exam  Constitutional: She appears well-developed and well-nourished. No distress.  HENT:  Head: Normocephalic.  Eyes: Conjunctivae are normal. Pupils are equal, round, and  reactive to light. No scleral icterus.  Neck: Normal range of motion. Neck supple. No thyromegaly present.  Cardiovascular: Normal rate and regular rhythm.  Exam reveals no gallop and no friction rub.   No murmur heard. Pulmonary/Chest: Effort normal and breath sounds normal. No respiratory distress. She has no wheezes. She has no rales.  Abdominal: Soft. Bowel sounds are normal. She exhibits no distension. There is no tenderness. There is no rebound.  Rectal is guaiac positive, dark stool. No frank blood.  Musculoskeletal: Normal range of motion.  Neurological: She is alert.  Skin: Skin is warm and dry. No rash noted.  Symmetric  LE edema.  Psychiatric: She has a normal mood and affect. Her behavior is normal.    ED Course  Procedures (including critical care time) Labs Review Labs Reviewed  URINALYSIS, ROUTINE W REFLEX MICROSCOPIC - Abnormal; Notable for the following:    APPearance TURBID (*)    Hgb urine dipstick MODERATE (*)    Protein, ur 100 (*)    Leukocytes, UA LARGE (*)    All other components within normal limits  CBC WITH DIFFERENTIAL - Abnormal; Notable for the following:    RBC 2.00 (*)    Hemoglobin 6.2 (*)    HCT 18.4 (*)    Neutrophils Relative % 87 (*)    Lymphocytes Relative 7 (*)    Lymphs Abs 0.6 (*)    All other components within normal limits  BASIC METABOLIC PANEL - Abnormal; Notable for the following:    Sodium 136 (*)    Chloride 88 (*)    Glucose, Bld 102 (*)    BUN 116 (*)    Creatinine, Ser 7.87 (*)    Calcium 7.0 (*)    GFR calc non Af Amer 4 (*)    GFR calc Af Amer 5 (*)    All other components within normal limits  IRON AND TIBC - Abnormal; Notable for the following:    TIBC 146 (*)    Saturation Ratios 74 (*)    UIBC 38 (*)    All other components within normal limits  RETICULOCYTES - Abnormal; Notable for the following:    RBC. 2.04 (*)    All other components within normal limits  VITAMIN B12 - Abnormal; Notable for the following:    Vitamin B-12 1915 (*)    All other components within normal limits  URINE MICROSCOPIC-ADD ON - Abnormal; Notable for the following:    Squamous Epithelial / LPF FEW (*)    Bacteria, UA MANY (*)    All other components within normal limits  PROTIME-INR - Abnormal; Notable for the following:    Prothrombin Time 50.8 (*)    INR 5.96 (*)    All other components within normal limits  HEMOGLOBIN AND HEMATOCRIT, BLOOD - Abnormal; Notable for the following:    Hemoglobin 8.2 (*)    HCT 23.9 (*)    All other components within normal limits  HEMOGLOBIN AND HEMATOCRIT, BLOOD - Abnormal; Notable for the following:    Hemoglobin 8.8 (*)     HCT 25.5 (*)    All other components within normal limits  COMPREHENSIVE METABOLIC PANEL - Abnormal; Notable for the following:    Potassium 3.4 (*)    Chloride 92 (*)    Glucose, Bld 67 (*)    BUN 118 (*)    Creatinine, Ser 7.76 (*)    Calcium 6.9 (*)    Total Protein 5.2 (*)    Albumin 2.1 (*)  AST 48 (*)    GFR calc non Af Amer 4 (*)    GFR calc Af Amer 5 (*)    All other components within normal limits  RENAL FUNCTION PANEL - Abnormal; Notable for the following:    Sodium 132 (*)    Chloride 89 (*)    Glucose, Bld 52 (*)    BUN 107 (*)    Creatinine, Ser 7.54 (*)    Calcium 7.4 (*)    Phosphorus 7.3 (*)    Albumin 1.9 (*)    GFR calc non Af Amer 4 (*)    GFR calc Af Amer 5 (*)    All other components within normal limits  CBC - Abnormal; Notable for the following:    RBC 3.00 (*)    Hemoglobin 8.9 (*)    HCT 25.8 (*)    RDW 18.1 (*)    All other components within normal limits  POC OCCULT BLOOD, ED - Abnormal; Notable for the following:    Fecal Occult Bld POSITIVE (*)    All other components within normal limits  URINE CULTURE  MRSA PCR SCREENING  FOLATE  SODIUM, URINE, RANDOM  CREATININE, URINE, RANDOM  PROTIME-INR  TYPE AND SCREEN  PREPARE RBC (CROSSMATCH)  PREPARE FRESH FROZEN PLASMA   Imaging Review US Renal  02/18/2014   CLINICAL DATA Acute on chronic renal failure, history hypertension  EXAM RENAL/URINARY TRACT ULTRASOUND COMPLETE  COMPARISON CT abdomen and pelvis 05/11/2011  FINDINGS Right Kidney:  Length: 8.2 cm. Minimal cortical thinning. Significantly increased cortical echogenicity. Large shadowing calculi within right kidney largest 10 mm diameter inferior pole. No definite mass or hydronephrosis.  Left Kidney:  Length: 8.8 cm. Normal cortical thickness. Significantly increased cortical echogenicity. No mass, hydronephrosis or shadowing calcification.  Bladder:  Wall appears thickened up to 12 mm thick though this may be in part related to the  presence of minimal urine and poor distention. No gross mass lesion.  IMPRESSION Medical renal disease changes without evidence of mass or hydronephrosis.  Nonobstructing right renal calculi to 10 mm diameter.  SIGNATURE  Electronically Signed   By: Lavonia Dana M.D.   On: 02/18/2014 16:59   Dg Chest Port 1 View  02/18/2014   CLINICAL DATA:  Fluid overload.  Abnormal renal labs.  EXAM: PORTABLE CHEST - 1 VIEW  COMPARISON:  DG CHEST 1V PORT dated 08/31/2013; CT CHEST W/O CM dated 03/15/2013  FINDINGS: Cardiopericardial silhouette within normal limits. Mediastinal contours normal. Trachea midline. No airspace disease or effusion. Monitoring leads project over the chest. Bilateral glenohumeral osteoarthritis is present with probable right rotator cuff tear.  IMPRESSION: No active cardiopulmonary disease.   Electronically Signed   By: Dereck Ligas M.D.   On: 02/18/2014 12:34     EKG Interpretation   Date/Time:  Tuesday February 18 2014 12:37:07 EDT Ventricular Rate:  59 PR Interval:    QRS Duration: 144 QT Interval:  535 QTC Calculation: 530 R Axis:   35 Text Interpretation:  Junctional rhythm Right bundle branch block Artifact  in lead(s) I II III aVR aVL aVF V1 V2 ED PHYSICIAN INTERPRETATION  AVAILABLE IN [facility] Confirmed by TEST, Record ([id number]) on  02/20/2014 7:05:03 AM      MDM   Final diagnoses:  Upper GI bleed  Coagulopathy  Acute kidney injury    Pt with acute blood loss anemia secondary to UGIB. Acute Real failure.  Pt admitted.  Protonix drip.  Nephrology and GI consult.  Tanna Furry, MD 02/20/14 1050

## 2014-02-18 NOTE — ED Notes (Addendum)
Per son pt's DR. Detrding called this am and instructed them to come to the ED for further evaluation of abnormal blood work. Son is not sure what labs were taken or what labs were abnormal. Pt states "I can't remember why I'm here." pt appears in NAD, alert and oriented to self and location. Pt reports intermittent lower back pain. Pt denies burning with urination but is incontinent of urine

## 2014-02-18 NOTE — ED Notes (Signed)
PT has hx of stroke in 09/14 and hx of a-fib-on coumadin but labs done at MD office yesterday revealed high INR of 4.9 and renal lab abnormalities. Provider advised PT come in to ED for eval

## 2014-02-18 NOTE — ED Notes (Signed)
Report given to Robert Lee, Therapist, sports. Pt transported to unit on cardiac monitor. RN on unit to continue blood infusion.

## 2014-02-18 NOTE — ED Notes (Signed)
Lab called with critical hemoglobin of 6.2, Dr. Jeneen Rinks notified.

## 2014-02-19 ENCOUNTER — Ambulatory Visit: Payer: Medicare Other

## 2014-02-19 LAB — TYPE AND SCREEN
ABO/RH(D): B POS
Antibody Screen: NEGATIVE
UNIT DIVISION: 0
Unit division: 0

## 2014-02-19 LAB — PROTIME-INR
INR: 1.16 (ref 0.00–1.49)
Prothrombin Time: 14.6 seconds (ref 11.6–15.2)

## 2014-02-19 LAB — COMPREHENSIVE METABOLIC PANEL
ALT: 32 U/L (ref 0–35)
AST: 48 U/L — ABNORMAL HIGH (ref 0–37)
Albumin: 2.1 g/dL — ABNORMAL LOW (ref 3.5–5.2)
Alkaline Phosphatase: 94 U/L (ref 39–117)
BUN: 118 mg/dL — ABNORMAL HIGH (ref 6–23)
CO2: 21 meq/L (ref 19–32)
Calcium: 6.9 mg/dL — ABNORMAL LOW (ref 8.4–10.5)
Chloride: 92 mEq/L — ABNORMAL LOW (ref 96–112)
Creatinine, Ser: 7.76 mg/dL — ABNORMAL HIGH (ref 0.50–1.10)
GFR, EST AFRICAN AMERICAN: 5 mL/min — AB (ref >90)
GFR, EST NON AFRICAN AMERICAN: 4 mL/min — AB (ref >90)
GLUCOSE: 67 mg/dL — AB (ref 70–99)
POTASSIUM: 3.4 meq/L — AB (ref 3.7–5.3)
SODIUM: 137 meq/L (ref 137–147)
Total Bilirubin: 0.7 mg/dL (ref 0.3–1.2)
Total Protein: 5.2 g/dL — ABNORMAL LOW (ref 6.0–8.3)

## 2014-02-19 LAB — HEMOGLOBIN AND HEMATOCRIT, BLOOD
HCT: 25.5 % — ABNORMAL LOW (ref 36.0–46.0)
Hemoglobin: 8.8 g/dL — ABNORMAL LOW (ref 12.0–15.0)

## 2014-02-19 NOTE — Progress Notes (Signed)
Chaplain made initial visit. Pt was alert and friendly but stated that she was "bored." Pt shared about her son, with whom she lives, and about her late husband. She also talked about her daughter and granddaughter. Pt stated that she is looking forward to going home and that her son "takes very good care" of her. Chaplain practiced reflective listening and caring presence. Pt expressed thanks for visit.

## 2014-02-19 NOTE — Progress Notes (Signed)
[facility] Progress Note [place] TEAM 1 - Stepdown/ICU TEAM   CHANLER SCHREITER MHD:[id number] DOB: 26-Dec-1925 DOA: 02/18/2014 PCP: Robyn Haber, MD  Brief narrative: [name] is a 80 y.o. female presenting on 02/18/2014 with  has a past medical history of Dementia; Hypertension; Ulcer; Stomach cancer;  Fracture, intertrochanteric, left femur (03/25/2012); Fracture, intertrochanteric, right femur (06/2008); Nodule of left lung; Paroxysmal atrial fibrillation; and Tachycardia-bradycardia who presents with a Hgb of 6.2 and INR of 5.9. Hemoccult positive.    Subjective: Pt alert. Not sure of why she is in the hospital today. No complaints.   Assessment/Plan: Principal Problem:   Upper GI bleed? - Melena per history obtained from pt but she does have dementia and today is unable to tell me about the melena even after I let her know that she is here for a GI bleed.  - have consulted GI  Active Problems:    Atrial fibrillation - stable -INR corrected w Vit K    Acute on chronic renal failure - renal following- dialysis if no improvement is seen    Anemia - Hgb 8.8 after 2 U PRBC    UTI (lower urinary tract infection) - Rocephin    Code Status: Full code Family Communication: none Disposition Plan: transfer to m/s- PT eval  Consultants: nephrology GI  Procedures: none  Antibiotics: Antibiotics Given (last 72 hours)   None       DVT prophylaxis: SCDs  Objective: Filed Weights   02/18/14 1104 02/19/14 0500  Weight: 41.277 kg (91 lb) 44.1 kg (97 lb 3.6 oz)   Blood pressure 128/49, pulse 58, temperature 97.4 F (36.3 C), temperature source Oral, resp. rate 0, height 5\' 2"  (1.575 m), weight 44.1 kg (97 lb 3.6 oz), SpO2 98.00%.  Intake/Output Summary (Last 24 hours) at 02/19/14 1457 Last data filed at 02/19/14 0700  Gross per 24 hour  Intake 1870.41 ml  Output      0 ml  Net 1870.41 ml     Exam: General: No acute respiratory distress Lungs:  Clear to auscultation bilaterally without wheezes or crackles Cardiovascular: Regular rate and rhythm without murmur gallop or rub normal S1 and S2 Abdomen: Nontender, nondistended, soft, bowel sounds positive, no rebound, no ascites, no appreciable mass Extremities: No significant cyanosis, clubbing- +  edema bilateral medial ankles  Data Reviewed: Basic Metabolic Panel:  Recent Labs Lab 02/18/14 1205 02/19/14 0521  NA 136* 137  K 3.8 3.4*  CL 88* 92*  CO2 25 21  GLUCOSE 102* 67*  BUN 116* 118*  CREATININE 7.87* 7.76*  CALCIUM 7.0* 6.9*   Liver Function Tests:  Recent Labs Lab 02/17/14 1718 02/19/14 0521  AST 58* 48*  ALT 42* 32  ALKPHOS 105 94  BILITOT 0.8 0.7  PROT 5.7* 5.2*  ALBUMIN 2.3* 2.1*   No results found for this basename: LIPASE, AMYLASE,  in the last 168 hours No results found for this basename: AMMONIA,  in the last 168 hours CBC:  Recent Labs Lab 02/18/14 1205 02/18/14 1830 02/19/14 0521  WBC 8.6  --   --   NEUTROABS 7.5  --   --   HGB 6.2* 8.2* 8.8*  HCT 18.4* 23.9* 25.5*  MCV 92.0  --   --   PLT 239  --   --    Cardiac Enzymes: No results found for this basename: CKTOTAL, CKMB, CKMBINDEX, TROPONINI,  in the last 168 hours BNP (last 3 results) No results found for this basename: PROBNP,  in the last 8760 hours CBG: No results found for this basename: GLUCAP,  in the last 168 hours  Recent Results (from the past 240 hour(s))  MRSA PCR SCREENING     Status: None   Collection Time    02/18/14  6:14 PM      Result Value Ref Range Status   MRSA by PCR NEGATIVE  NEGATIVE Final   Comment:            The GeneXpert MRSA Assay (FDA     approved for NASAL specimens     only), is one component of a     comprehensive MRSA colonization     surveillance program. It is not     intended to diagnose MRSA     infection nor to guide or     monitor treatment for     MRSA infections.     Studies:  Recent x-ray studies have been reviewed in detail  by the Attending Physician  Scheduled Meds:  Scheduled Meds: . amiodarone  100 mg Oral Daily  . calcitRIOL  0.25 mcg Oral Daily  . calcium acetate  1,334 mg Oral TID WC  . cefTRIAXone (ROCEPHIN)  IV  1 g Intravenous Q24H  . darbepoetin (ARANESP) injection - NON-DIALYSIS  100 mcg Subcutaneous Q Wed-HD  . levothyroxine  25 mcg Oral QAC breakfast  . sodium bicarbonate  650 mg Oral QID   Continuous Infusions: . sodium chloride 50 mL/hr at 02/18/14 1900  . pantoprozole (PROTONIX) infusion 8 mg/hr (02/19/14 0700)    Time spent on care of this patient: 45 min   Debbe Odea, MD  [facility] Office  [phone] Pager - Text Page per Shea Evans as per below:  On-Call/Text Page:      [email or web address]  If 7PM-7AM, please contact night-coverage [email or web address] 02/19/2014, 2:57 PM   LOS: 1 day

## 2014-02-19 NOTE — Progress Notes (Signed)
PCP: Robyn Haber, MD  [name] is a 80 y.o. female who presents today for routine electrophysiology followup.  She has not followed up with primary care for thyroid dysfunction as we recommended.  She has not had her INR checked recently.  Today, she denies symptoms of palpitations, chest pain, shortness of breath,  lower extremity edema, dizziness, presyncope, or syncope.  The patient is otherwise without complaint today.   Past Medical History  Diagnosis Date  . Dementia   . Hypertension   . Ulcer     Hx of  . Stomach cancer   . Snoring   . Fracture, intertrochanteric, left femur 03/25/2012  . Fracture, intertrochanteric, right femur 06/2008    s/p closed reduction and internal fixation  . Nodule of left lung     followed by Dr. Elsworth Soho // LLL spiculated nodule noted per CT in 2012 1.5x1.6x2.4, PET in 06/03/2011 was low uptake.  . Paroxysmal atrial fibrillation   . Tachycardia-bradycardia    Past Surgical History  Procedure Laterality Date  . Stomach surgery      3/4 of stomach removed due to stomach cancer  . Hip surgery  2009    right  . Total hip arthroplasty  03/29/2012    Procedure: TOTAL HIP ARTHROPLASTY;  Surgeon: Johnny Bridge, MD;  Location: [place];  Service: Orthopedics;  Laterality: Left;  Left Hip Hemiarthroplasty    No current facility-administered medications for this visit.   No current outpatient prescriptions on file.   Facility-Administered Medications Ordered in Other Visits  Medication Dose Route Frequency Provider Last Rate Last Dose  . 0.9 %  sodium chloride infusion   Intravenous Continuous Belkys A Harrel Carina, MD 50 mL/hr at 02/19/14 2019    . acetaminophen (TYLENOL) tablet 650 mg  650 mg Oral Q6H PRN Belkys A Harrel Carina, MD   650 mg at 02/19/14 1801   Or  . acetaminophen (TYLENOL) suppository 650 mg  650 mg Rectal Q6H PRN Belkys A Harrel Carina, MD      . amiodarone (PACERONE) tablet 100 mg  100 mg Oral Daily Belkys A Harrel Carina,  MD   100 mg at 02/19/14 1059  . calcitRIOL (ROCALTROL) capsule 0.25 mcg  0.25 mcg Oral Daily Belkys A Harrel Carina, MD   0.25 mcg at 02/19/14 1059  . calcium acetate (PHOSLO) capsule 1,334 mg  1,334 mg Oral TID WC Jay K. Posey Pronto, MD   1,334 mg at 02/19/14 1729  . cefTRIAXone (ROCEPHIN) 1 g in dextrose 5 % 50 mL IVPB  1 g Intravenous Q24H Belkys A Harrel Carina, MD 100 mL/hr at 02/19/14 1749 1 g at 02/19/14 1749  . darbepoetin (ARANESP) injection 100 mcg  100 mcg Subcutaneous Q Wed-HD Clayborne Dana. Posey Pronto, MD   100 mcg at 02/19/14 1259  . levothyroxine (SYNTHROID, LEVOTHROID) tablet 25 mcg  25 mcg Oral QAC breakfast Belkys A Harrel Carina, MD   25 mcg at 02/19/14 [phone]  . ondansetron (ZOFRAN) tablet 4 mg  4 mg Oral Q6H PRN Belkys A Harrel Carina, MD       Or  . ondansetron (ZOFRAN) injection 4 mg  4 mg Intravenous Q6H PRN Belkys A Harrel Carina, MD      . pantoprazole (PROTONIX) 80 mg in sodium chloride 0.9 % 250 mL infusion  8 mg/hr Intravenous Continuous Tanna Furry, MD 25 mL/hr at 02/19/14 1900 8 mg/hr at 02/19/14 1900  . sodium bicarbonate tablet 650 mg  650 mg Oral QID Belkys A Harrel Carina,  MD   650 mg at 02/19/14 2122    Physical Exam: Filed Vitals:   02/17/14 1655  BP: 110/58  Pulse: 58  Height: 5\' 4"  (1.626 m)  Weight: 93 lb (42.185 kg)    GEN- The patient is elderly appearing, alert, disheveled and poorly kept Head- normocephalic, atraumatic Eyes-  Sclera clear, conjunctiva pink Ears- hearing intact Oropharynx- clear Lungs- Clear to ausculation bilaterally, normal work of breathing Heart- Regular rate and rhythm, no murmurs, rubs or gallops, PMI not laterally displaced GI- soft, NT, ND, + BS Extremities- no clubbing, cyanosis, or edema  ekg today reveals sinus rhythm 58 bpm, RBBB, QTc 540 Labs reviewed with patient from prior visit today  Assessment and Plan:  1. afib Maintaining sinus rhythm with amiodarone 100mg  daily Coumadin (goal INR 2-3)--the importance of  compliance with INRs was stressed today She also needs to re-establish with primary care for management of her thyroid disease.  She has been through 3 prior PCPs and is no longer seeing Dr Robyn Haber, MD.  2. Bradycardia/tachycardia Asymptomatic No indication for pacing presently  3. HTN Stable No change required today  4. Dementia Advanced We are approaching palliative measures I suspect  No changes today  Return to see [name] in 4 months I will see in 12 months

## 2014-02-19 NOTE — Progress Notes (Signed)
Subjective: No complaints.  Objective: Vital signs in last 24 hours: Temp:  [97.4 F (36.3 C)-98.7 F (37.1 C)] 97.7 F (36.5 C) (03/11 1127) Pulse Rate:  [53-62] 58 (03/11 0720) Resp:  [0-25] 10 (03/11 1127) BP: (121-138)/(39-56) 121/56 mmHg (03/11 1127) SpO2:  [94 %-99 %] 96 % (03/11 1127) Weight:  [44.1 kg (97 lb 3.6 oz)] 44.1 kg (97 lb 3.6 oz) (03/11 0500) Weight change:     PE: GEN:  NAD, demented ABD:  Soft  Lab Results: Results for orders placed during the hospital encounter of 02/18/14 (from the past 48 hour(s))  CBC WITH DIFFERENTIAL     Status: Abnormal   Collection Time    02/18/14 12:05 PM      Result Value Ref Range   WBC 8.6  4.0 - 10.5 K/uL   RBC 2.00 (*) 3.87 - 5.11 MIL/uL   Hemoglobin 6.2 (*) 12.0 - 15.0 g/dL   Comment: REPEATED TO VERIFY     CRITICAL RESULT CALLED TO, READ BACK BY AND VERIFIED WITH:     Carmelia Roller RN [id number] 02/18/2014 BY MACEDA, J   HCT 18.4 (*) 36.0 - 46.0 %   MCV 92.0  78.0 - 100.0 fL   MCH 31.0  26.0 - 34.0 pg   MCHC 33.7  30.0 - 36.0 g/dL   RDW 13.9  11.5 - 15.5 %   Platelets 239  150 - 400 K/uL   Neutrophils Relative % 87 (*) 43 - 77 %   Neutro Abs 7.5  1.7 - 7.7 K/uL   Lymphocytes Relative 7 (*) 12 - 46 %   Lymphs Abs 0.6 (*) 0.7 - 4.0 K/uL   Monocytes Relative 6  3 - 12 %   Monocytes Absolute 0.5  0.1 - 1.0 K/uL   Eosinophils Relative 0  0 - 5 %   Eosinophils Absolute 0.0  0.0 - 0.7 K/uL   Basophils Relative 0  0 - 1 %   Basophils Absolute 0.0  0.0 - 0.1 K/uL  BASIC METABOLIC PANEL     Status: Abnormal   Collection Time    02/18/14 12:05 PM      Result Value Ref Range   Sodium 136 (*) 137 - 147 mEq/L   Potassium 3.8  3.7 - 5.3 mEq/L   Chloride 88 (*) 96 - 112 mEq/L   CO2 25  19 - 32 mEq/L   Glucose, Bld 102 (*) 70 - 99 mg/dL   BUN 116 (*) 6 - 23 mg/dL   Creatinine, Ser 7.87 (*) 0.50 - 1.10 mg/dL   Calcium 7.0 (*) 8.4 - 10.5 mg/dL   GFR calc non Af Amer 4 (*) >90 mL/min   GFR calc Af Amer 5 (*) >90 mL/min    Comment: (NOTE)     The eGFR has been calculated using the CKD EPI equation.     This calculation has not been validated in all clinical situations.     eGFR's persistently <90 mL/min signify possible Chronic Kidney     Disease.  URINALYSIS, ROUTINE W REFLEX MICROSCOPIC     Status: Abnormal   Collection Time    02/18/14 12:59 PM      Result Value Ref Range   Color, Urine YELLOW  YELLOW   APPearance TURBID (*) CLEAR   Specific Gravity, Urine 1.013  1.005 - 1.030   pH 7.5  5.0 - 8.0   Glucose, UA NEGATIVE  NEGATIVE mg/dL   Hgb urine dipstick MODERATE (*) NEGATIVE   Bilirubin  Urine NEGATIVE  NEGATIVE   Ketones, ur NEGATIVE  NEGATIVE mg/dL   Protein, ur 100 (*) NEGATIVE mg/dL   Urobilinogen, UA 0.2  0.0 - 1.0 mg/dL   Nitrite NEGATIVE  NEGATIVE   Leukocytes, UA LARGE (*) NEGATIVE  URINE MICROSCOPIC-ADD ON     Status: Abnormal   Collection Time    02/18/14 12:59 PM      Result Value Ref Range   Squamous Epithelial / LPF FEW (*) RARE   WBC, UA TOO NUMEROUS TO COUNT  <3 WBC/hpf   RBC / HPF 3-6  <3 RBC/hpf   Bacteria, UA MANY (*) RARE  SODIUM, URINE, RANDOM     Status: None   Collection Time    02/18/14 12:59 PM      Result Value Ref Range   Sodium, Ur 80    CREATININE, URINE, RANDOM     Status: None   Collection Time    02/18/14 12:59 PM      Result Value Ref Range   Creatinine, Urine 30.24    TYPE AND SCREEN     Status: None   Collection Time    02/18/14  1:22 PM      Result Value Ref Range   ABO/RH(D) B POS     Antibody Screen NEG     Sample Expiration 02/21/2014     Unit Number [id number]     Blood Component Type RED CELLS,LR     Unit division 00     Status of Unit ISSUED,FINAL     Transfusion Status OK TO TRANSFUSE     Crossmatch Result Compatible     Unit Number [id number]     Blood Component Type RED CELLS,LR     Unit division 00     Status of Unit ISSUED,FINAL     Transfusion Status OK TO TRANSFUSE     Crossmatch Result Compatible    PREPARE RBC  (CROSSMATCH)     Status: None   Collection Time    02/18/14  1:22 PM      Result Value Ref Range   Order Confirmation ORDER PROCESSED BY BLOOD BANK    IRON AND TIBC     Status: Abnormal   Collection Time    02/18/14  1:37 PM      Result Value Ref Range   Iron 108  42 - 135 ug/dL   TIBC 146 (*) 250 - 470 ug/dL   Saturation Ratios 74 (*) 20 - 55 %   UIBC 38 (*) 125 - 400 ug/dL   Comment: Performed at [facility]  RETICULOCYTES     Status: Abnormal   Collection Time    02/18/14  1:37 PM      Result Value Ref Range   Retic Ct Pct 2.7  0.4 - 3.1 %   RBC. 2.04 (*) 3.87 - 5.11 MIL/uL   Retic Count, Manual 55.1  19.0 - 186.0 K/uL  VITAMIN B12     Status: Abnormal   Collection Time    02/18/14  1:37 PM      Result Value Ref Range   Vitamin B-12 1915 (*) 211 - 911 pg/mL   Comment: Performed at [facility]     Status: None   Collection Time    02/18/14  1:37 PM      Result Value Ref Range   Folate >20.0     Comment: (NOTE)     Reference Ranges  Deficient:       0.4 - 3.3 ng/mL            Indeterminate:   3.4 - 5.4 ng/mL            Normal:              > 5.4 ng/mL     Performed at [facility]     Status: Abnormal   Collection Time    02/18/14  2:00 PM      Result Value Ref Range   Prothrombin Time 50.8 (*) 11.6 - 15.2 seconds   INR 5.96 (*) 0.00 - 1.49   Comment: REPEATED TO VERIFY     CRITICAL RESULT CALLED TO, READ BACK BY AND VERIFIED WITH:     Carmelia Roller RN [id number] 02/18/14 A BROWNING  POC OCCULT BLOOD, ED     Status: Abnormal   Collection Time    02/18/14  2:08 PM      Result Value Ref Range   Fecal Occult Bld POSITIVE (*) NEGATIVE  PREPARE FRESH FROZEN PLASMA     Status: None   Collection Time    02/18/14  3:10 PM      Result Value Ref Range   Unit Number [phone]     Blood Component Type THAWED PLASMA     Unit division 00     Status of Unit ISSUED,FINAL     Transfusion Status OK TO TRANSFUSE     Unit  Number [id number]     Blood Component Type THAWED PLASMA     Unit division 00     Status of Unit ISSUED     Transfusion Status OK TO TRANSFUSE    MRSA PCR SCREENING     Status: None   Collection Time    02/18/14  6:14 PM      Result Value Ref Range   MRSA by PCR NEGATIVE  NEGATIVE   Comment:            The GeneXpert MRSA Assay (FDA     approved for NASAL specimens     only), is one component of a     comprehensive MRSA colonization     surveillance program. It is not     intended to diagnose MRSA     infection nor to guide or     monitor treatment for     MRSA infections.  HEMOGLOBIN AND HEMATOCRIT, BLOOD     Status: Abnormal   Collection Time    02/18/14  6:30 PM      Result Value Ref Range   Hemoglobin 8.2 (*) 12.0 - 15.0 g/dL   Comment: REPEATED TO VERIFY     POST TRANSFUSION SPECIMEN   HCT 23.9 (*) 36.0 - 46.0 %  HEMOGLOBIN AND HEMATOCRIT, BLOOD     Status: Abnormal   Collection Time    02/19/14  5:21 AM      Result Value Ref Range   Hemoglobin 8.8 (*) 12.0 - 15.0 g/dL   HCT 25.5 (*) 36.0 - 46.0 %  COMPREHENSIVE METABOLIC PANEL     Status: Abnormal   Collection Time    02/19/14  5:21 AM      Result Value Ref Range   Sodium 137  137 - 147 mEq/L   Potassium 3.4 (*) 3.7 - 5.3 mEq/L   Chloride 92 (*) 96 - 112 mEq/L   CO2 21  19 - 32 mEq/L   Glucose, Bld 67 (*) 70 -  99 mg/dL   BUN 118 (*) 6 - 23 mg/dL   Creatinine, Ser 7.76 (*) 0.50 - 1.10 mg/dL   Calcium 6.9 (*) 8.4 - 10.5 mg/dL   Total Protein 5.2 (*) 6.0 - 8.3 g/dL   Albumin 2.1 (*) 3.5 - 5.2 g/dL   AST 48 (*) 0 - 37 U/L   ALT 32  0 - 35 U/L   Alkaline Phosphatase 94  39 - 117 U/L   Total Bilirubin 0.7  0.3 - 1.2 mg/dL   GFR calc non Af Amer 4 (*) >90 mL/min   GFR calc Af Amer 5 (*) >90 mL/min   Comment: (NOTE)     The eGFR has been calculated using the CKD EPI equation.     This calculation has not been validated in all clinical situations.     eGFR's persistently <90 mL/min signify possible Chronic  Kidney     Disease.  PROTIME-INR     Status: None   Collection Time    02/19/14  5:21 AM      Result Value Ref Range   Prothrombin Time 14.6  11.6 - 15.2 seconds   INR 1.16  0.00 - 1.49    Studies/Results: US Renal  02/18/2014   CLINICAL DATA Acute on chronic renal failure, history hypertension  EXAM RENAL/URINARY TRACT ULTRASOUND COMPLETE  COMPARISON CT abdomen and pelvis 05/11/2011  FINDINGS Right Kidney:  Length: 8.2 cm. Minimal cortical thinning. Significantly increased cortical echogenicity. Large shadowing calculi within right kidney largest 10 mm diameter inferior pole. No definite mass or hydronephrosis.  Left Kidney:  Length: 8.8 cm. Normal cortical thickness. Significantly increased cortical echogenicity. No mass, hydronephrosis or shadowing calcification.  Bladder:  Wall appears thickened up to 12 mm thick though this may be in part related to the presence of minimal urine and poor distention. No gross mass lesion.  IMPRESSION Medical renal disease changes without evidence of mass or hydronephrosis.  Nonobstructing right renal calculi to 10 mm diameter.  SIGNATURE  Electronically Signed   By: Lavonia Dana M.D.   On: 02/18/2014 16:59   Dg Chest Port 1 View  02/18/2014   CLINICAL DATA:  Fluid overload.  Abnormal renal labs.  EXAM: PORTABLE CHEST - 1 VIEW  COMPARISON:  DG CHEST 1V PORT dated 08/31/2013; CT CHEST W/O CM dated 03/15/2013  FINDINGS: Cardiopericardial silhouette within normal limits. Mediastinal contours normal. Trachea midline. No airspace disease or effusion. Monitoring leads project over the chest. Bilateral glenohumeral osteoarthritis is present with probable right rotator cuff tear.  IMPRESSION: No active cardiopulmonary disease.   Electronically Signed   By: Dereck Ligas M.D.   On: 02/18/2014 12:34   Assessment:  1.  Anemia, acute on chronic. 2.  Possible ? Dark stools, recently on warfarin. 3.  Coagulopathy, now resolved. 4.  Personal history stomach  cancer.  Plan:  1.  Hold warfarin. 2.  PPI. 3.  Endoscopy tomorrow. 4.  If endoscopy is unrevealing, I would attribute any GI tract blood losses to diffuse mucosal oozing in setting of coagulopathy.  Could always consider further work-up including colonoscopy as outpatient, but given her dementia and comorbidities would like to hold off on this if at all possible. 5.  Risks (bleeding, infection, bowel perforation that could require surgery, sedation-related changes in cardiopulmonary systems), benefits (identification and possible treatment of source of symptoms, exclusion of certain causes of symptoms), and alternatives (watchful waiting, radiographic imaging studies, empiric medical treatment) of upper endoscopy (EGD) were explained to patient/family in detail  and patient wishes to proceed. 6.  Case discussed in detail with her son.   Landry Dyke 02/19/2014, 4:48 PM

## 2014-02-19 NOTE — Progress Notes (Addendum)
S: Appetite poor.  No N/V O:BP 128/49  Pulse 58  Temp(Src) 97.5 F (36.4 C) (Oral)  Resp 0  Ht 5\' 2"  (1.575 m)  Wt 44.1 kg (97 lb 3.6 oz)  BMI 17.78 kg/m2  SpO2 98%  Intake/Output Summary (Last 24 hours) at 02/19/14 0851 Last data filed at 02/19/14 0700  Gross per 24 hour  Intake 2205.41 ml  Output      0 ml  Net 2205.41 ml   Weight change:  FUX:NATFT and alert CVS:Sl bradycardic but reg Resp:clear Abd:+ BS NTND no HSM Ext:no edema NEURO:CNI Ox2 no asterixis   . amiodarone  100 mg Oral Daily  . calcitRIOL  0.25 mcg Oral Daily  . calcium acetate  1,334 mg Oral TID WC  . cefTRIAXone (ROCEPHIN)  IV  1 g Intravenous Q24H  . darbepoetin (ARANESP) injection - NON-DIALYSIS  100 mcg Subcutaneous Q Wed-HD  . levothyroxine  25 mcg Oral QAC breakfast  . sodium bicarbonate  650 mg Oral QID   US Renal  02/18/2014   CLINICAL DATA Acute on chronic renal failure, history hypertension  EXAM RENAL/URINARY TRACT ULTRASOUND COMPLETE  COMPARISON CT abdomen and pelvis 05/11/2011  FINDINGS Right Kidney:  Length: 8.2 cm. Minimal cortical thinning. Significantly increased cortical echogenicity. Large shadowing calculi within right kidney largest 10 mm diameter inferior pole. No definite mass or hydronephrosis.  Left Kidney:  Length: 8.8 cm. Normal cortical thickness. Significantly increased cortical echogenicity. No mass, hydronephrosis or shadowing calcification.  Bladder:  Wall appears thickened up to 12 mm thick though this may be in part related to the presence of minimal urine and poor distention. No gross mass lesion.  IMPRESSION Medical renal disease changes without evidence of mass or hydronephrosis.  Nonobstructing right renal calculi to 10 mm diameter.  SIGNATURE  Electronically Signed   By: Lavonia Dana M.D.   On: 02/18/2014 16:59   Dg Chest Port 1 View  02/18/2014   CLINICAL DATA:  Fluid overload.  Abnormal renal labs.  EXAM: PORTABLE CHEST - 1 VIEW  COMPARISON:  DG CHEST 1V PORT dated  08/31/2013; CT CHEST W/O CM dated 03/15/2013  FINDINGS: Cardiopericardial silhouette within normal limits. Mediastinal contours normal. Trachea midline. No airspace disease or effusion. Monitoring leads project over the chest. Bilateral glenohumeral osteoarthritis is present with probable right rotator cuff tear.  IMPRESSION: No active cardiopulmonary disease.   Electronically Signed   By: Dereck Ligas M.D.   On: 02/18/2014 12:34   BMET    Component Value Date/Time   NA 137 02/19/2014 0521   K 3.4* 02/19/2014 0521   CL 92* 02/19/2014 0521   CO2 21 02/19/2014 0521   GLUCOSE 67* 02/19/2014 0521   BUN 118* 02/19/2014 0521   CREATININE 7.76* 02/19/2014 0521   CALCIUM 6.9* 02/19/2014 0521   GFRNONAA 4* 02/19/2014 0521   GFRAA 5* 02/19/2014 0521   CBC    Component Value Date/Time   WBC 8.6 02/18/2014 1205   WBC 6.7 09/18/2007 1403   RBC 2.04* 02/18/2014 1337   RBC 2.00* 02/18/2014 1205   RBC 4.12 09/18/2007 1403   HGB 8.8* 02/19/2014 0521   HGB 12.7 09/18/2007 1403   HCT 25.5* 02/19/2014 0521   HCT 37.1 09/18/2007 1403   PLT 239 02/18/2014 1205   PLT 310 09/18/2007 1403   MCV 92.0 02/18/2014 1205   MCV 90.2 09/18/2007 1403   MCH 31.0 02/18/2014 1205   MCH 30.8 09/18/2007 1403   MCHC 33.7 02/18/2014 1205   MCHC 34.1  09/18/2007 1403   RDW 13.9 02/18/2014 1205   RDW 12.3 09/18/2007 1403   LYMPHSABS 0.6* 02/18/2014 1205   LYMPHSABS 0.8* 09/18/2007 1403   MONOABS 0.5 02/18/2014 1205   MONOABS 0.5 09/18/2007 1403   EOSABS 0.0 02/18/2014 1205   EOSABS 0.0 09/18/2007 1403   BASOSABS 0.0 02/18/2014 1205   BASOSABS 0.1 09/18/2007 1403     Assessment: 1. Acute on CKD 4 2. GI bleed 3. Anemia 4. Pyuria, UC pending 5. Sec HPTH on calcitriol 6. Hx  A Fib, NSR now  Plan: 1. Place foley so I know how much urine she is making 2. Cont hydration.  If renal fx does not improve then will need HD.  Spoke to her and son and they are agreeable to HD if needed.  She does have some dementia which makes her a marginal candidate  but son adamant that she has reasonable QOL and he is her primary caretaker 3. Show son and pt dialysis videos. 4, Recheck Scr in AM 5. Follow Hg   Sandralee Tarkington T

## 2014-02-20 ENCOUNTER — Inpatient Hospital Stay (HOSPITAL_COMMUNITY): Admit: 2014-02-20 | Payer: Self-pay

## 2014-02-20 ENCOUNTER — Encounter (HOSPITAL_COMMUNITY)
Admission: EM | Disposition: A | Discharge status: Skilled Nursing Facility | Payer: BLUE CROSS/BLUE SHIELD | Source: Home / Self Care | Attending: Internal Medicine

## 2014-02-20 ENCOUNTER — Encounter (HOSPITAL_COMMUNITY): Payer: Self-pay | Admitting: *Deleted

## 2014-02-20 HISTORY — PX: ESOPHAGOGASTRODUODENOSCOPY: SHX5428

## 2014-02-20 LAB — URINE CULTURE: Colony Count: >=100000

## 2014-02-20 LAB — RENAL FUNCTION PANEL
Albumin: 1.9 g/dL — ABNORMAL LOW (ref 3.5–5.2)
BUN: 107 mg/dL — ABNORMAL HIGH (ref 6–23)
CO2: 20 meq/L (ref 19–32)
Calcium: 7.4 mg/dL — ABNORMAL LOW (ref 8.4–10.5)
Chloride: 89 mEq/L — ABNORMAL LOW (ref 96–112)
Creatinine, Ser: 7.54 mg/dL — ABNORMAL HIGH (ref 0.50–1.10)
GFR calc Af Amer: 5 mL/min — ABNORMAL LOW (ref >90)
GFR calc non Af Amer: 4 mL/min — ABNORMAL LOW (ref >90)
GLUCOSE: 52 mg/dL — AB (ref 70–99)
Phosphorus: 7.3 mg/dL — ABNORMAL HIGH (ref 2.3–4.6)
Potassium: 3.7 mEq/L (ref 3.7–5.3)
Sodium: 132 mEq/L — ABNORMAL LOW (ref 137–147)

## 2014-02-20 LAB — PREPARE FRESH FROZEN PLASMA
UNIT DIVISION: 0
Unit division: 0

## 2014-02-20 LAB — CBC
HEMATOCRIT: 25.8 % — AB (ref 36.0–46.0)
HEMOGLOBIN: 8.9 g/dL — AB (ref 12.0–15.0)
MCH: 29.7 pg (ref 26.0–34.0)
MCHC: 34.5 g/dL (ref 30.0–36.0)
MCV: 86 fL (ref 78.0–100.0)
Platelets: 192 10*3/uL (ref 150–400)
RBC: 3 MIL/uL — ABNORMAL LOW (ref 3.87–5.11)
RDW: 18.1 % — ABNORMAL HIGH (ref 11.5–15.5)
WBC: 7.5 10*3/uL (ref 4.0–10.5)

## 2014-02-20 SURGERY — EGD (ESOPHAGOGASTRODUODENOSCOPY)
Anesthesia: Moderate Sedation

## 2014-02-20 MED ORDER — MIDAZOLAM HCL 5 MG/ML IJ SOLN
INTRAMUSCULAR | Status: AC
  Filled 2014-02-20: qty 2

## 2014-02-20 MED ORDER — AMLODIPINE BESYLATE 5 MG PO TABS
5.0000 mg | ORAL_TABLET | Freq: Every day | ORAL | Status: DC
  Administered 2014-02-20 – 2014-03-03 (×11): 5 mg via ORAL
  Filled 2014-02-20 (×13): qty 1

## 2014-02-20 MED ORDER — SODIUM CHLORIDE 0.9 % IV SOLN: INTRAVENOUS | Status: DC

## 2014-02-20 MED ORDER — FENTANYL CITRATE 0.05 MG/ML IJ SOLN
INTRAMUSCULAR | Status: DC | PRN
  Administered 2014-02-20: 12.5 ug via INTRAVENOUS

## 2014-02-20 MED ORDER — PANTOPRAZOLE SODIUM 20 MG PO TBEC
20.0000 mg | DELAYED_RELEASE_TABLET | Freq: Every day | ORAL | Status: DC
  Administered 2014-02-20 – 2014-03-04 (×12): 20 mg via ORAL
  Filled 2014-02-20 (×15): qty 1

## 2014-02-20 MED ORDER — FENTANYL CITRATE 0.05 MG/ML IJ SOLN
INTRAMUSCULAR | Status: AC
Start: 2014-02-20 — End: 2014-02-20
  Filled 2014-02-20: qty 2

## 2014-02-20 MED ORDER — BUTAMBEN-TETRACAINE-BENZOCAINE 2-2-14 % EX AERO
INHALATION_SPRAY | CUTANEOUS | Status: DC | PRN
  Administered 2014-02-20: 2 via TOPICAL

## 2014-02-20 MED ORDER — NEPRO/CARBSTEADY PO LIQD
237.0000 mL | Freq: Two times a day (BID) | ORAL | Status: DC
  Administered 2014-02-21 – 2014-03-04 (×14): 237 mL via ORAL
  Filled 2014-02-20 (×3): qty 237

## 2014-02-20 MED ORDER — MIDAZOLAM HCL 10 MG/2ML IJ SOLN
INTRAMUSCULAR | Status: DC | PRN
  Administered 2014-02-20: 1 mg via INTRAVENOUS

## 2014-02-20 NOTE — Care Management Note (Unsigned)
    Page 1 of 1   02/20/2014     2:21:41 PM   CARE MANAGEMENT NOTE 02/20/2014  Patient:  [name], [name]   Account Number:  [id number]  Date Initiated:  02/20/2014  Documentation initiated by:  GRAVES-BIGELOW,Sullivan Jacuinde  Subjective/Objective Assessment:   Pt admitted for worsening renal function ( cr7)  hgb at 6.2. Guaiac stool positive,  INR at 5.9. Given 2 units of blood transfusion, FFP, vitamin k. Initiated on IV protonix Gtt. Pt is from home with son.     Action/Plan:   CM will continue to monitor for disposition needs. Pt will benefit from PT/OT consult for disposition needs when stable.   Anticipated DC Date:  02/24/2014   Anticipated DC Plan:  [place]  CM consult      Choice offered to / List presented to:             Status of service:  In process, will continue to follow Medicare Important Message given?   (If response is "NO", the following Medicare IM given date fields will be blank) Date Medicare IM given:   Date Additional Medicare IM given:    Discharge Disposition:    Per UR Regulation:  Reviewed for med. necessity/level of care/duration of stay  If discussed at [place] of Stay Meetings, dates discussed:    Comments:

## 2014-02-20 NOTE — Progress Notes (Signed)
INITIAL NUTRITION ASSESSMENT  DOCUMENTATION CODES Per approved criteria  -severe malnutrition  Pt meets criteria for severe MALNUTRITION in the context of chronic illness as evidenced by severe fat and muscle wasting.  INTERVENTION: Nepro Shake po BID, each supplement provides 425 kcal and 19 grams protein.  NUTRITION DIAGNOSIS: Inadequate oral intake related to chronic illness as evidenced by reported wt loss.   Goal: Pt to meet >/= 90% of their estimated nutrition needs   Monitor:  Wt trends, I/O's, labs, po intake  Reason for Assessment: MST  80 y.o. female  Admitting Dx: Upper GI bleed  ASSESSMENT: 80 y.o. female with PMH significant for stroke, stomach cancer, PUD, A fib on coumadin, CKD stage 3 last Cr per our record at 2.16, who was refer to the ED for further evaluation by Dr Deterdine due to worsening renal function ( cr at 7).   Pt underwent endoscopy on 3/12 for dark stools and concern for possible GI bleed.   Pt reports that her appetite is improving, and that she has felt hungry. Per RN, pt's diet has been upgraded to a renal diet, and that pt did very well with clear liquid tray. Pt reports a slight weight loss recently.  Nutrition Focused Physical Exam:  Subcutaneous Fat:  Orbital Region: moderate wasting Upper Arm Region: severe wasting Thoracic and Lumbar Region: severe wasting  Muscle:  Temple Region: severe wasting Clavicle Bone Region: severe wasting Clavicle and Acromion Bone Region: severe wasting Scapular Bone Region: n/a Dorsal Hand: severe wasting Patellar Region: severe wasting Anterior Thigh Region: n/a Posterior Calf Region: severe wasting  Edema: none  Height: Ht Readings from Last 1 Encounters:  02/18/14 5\' 2"  (1.575 m)    Weight: Wt Readings from Last 1 Encounters:  02/20/14 101 lb 6.6 oz (46 kg)    Ideal Body Weight: 50.1 kg  % Ideal Body Weight: 92%  Wt Readings from Last 10 Encounters:  02/20/14 101 lb 6.6 oz (46 kg)   02/20/14 101 lb 6.6 oz (46 kg)  02/17/14 93 lb (42.185 kg)  01/16/14 92 lb (41.731 kg)  11/26/13 94 lb (42.638 kg)  09/26/13 97 lb (43.999 kg)  09/17/13 98 lb 1.7 oz (44.5 kg)  09/05/13 97 lb (44 kg)  06/27/13 97 lb (43.999 kg)  06/04/13 97 lb 4.8 oz (44.135 kg)    Usual Body Weight: 90-100 lbs  % Usual Body Weight: 100%  BMI:  Body mass index is 18.54 kg/(m^2).  Estimated Nutritional Needs: Kcal: 1400-1600 Protein: 70-80 g Fluid: >1.6 L/day  Skin: WNL  Diet Order: NPO  EDUCATION NEEDS: -Education needs addressed   Intake/Output Summary (Last 24 hours) at 02/20/14 1114 Last data filed at 02/20/14 0800  Gross per 24 hour  Intake   1850 ml  Output    475 ml  Net   1375 ml    Last BM: 3/12   Labs:   Recent Labs Lab 02/18/14 1205 02/19/14 0521 02/20/14 0220  NA 136* 137 132*  K 3.8 3.4* 3.7  CL 88* 92* 89*  CO2 25 21 20   BUN 116* 118* 107*  CREATININE 7.87* 7.76* 7.54*  CALCIUM 7.0* 6.9* 7.4*  PHOS  --   --  7.3*  GLUCOSE 102* 67* 52*    CBG (last 3)  No results found for this basename: GLUCAP,  in the last 72 hours  Scheduled Meds: . amiodarone  100 mg Oral Daily  . calcitRIOL  0.25 mcg Oral Daily  . calcium acetate  1,334 mg  Oral TID WC  . cefTRIAXone (ROCEPHIN)  IV  1 g Intravenous Q24H  . darbepoetin (ARANESP) injection - NON-DIALYSIS  100 mcg Subcutaneous Q Wed-HD  . levothyroxine  25 mcg Oral QAC breakfast  . pantoprazole  20 mg Oral Daily  . sodium bicarbonate  650 mg Oral QID    Continuous Infusions: . sodium chloride 50 mL/hr at 02/19/14 2019  . sodium chloride      Past Medical History  Diagnosis Date  . Dementia   . Hypertension   . Ulcer     Hx of  . Stomach cancer   . Snoring   . Fracture, intertrochanteric, left femur 03/25/2012  . Fracture, intertrochanteric, right femur 06/2008    s/p closed reduction and internal fixation  . Nodule of left lung     followed by Dr. Elsworth Soho // LLL spiculated nodule noted per CT in 2012  1.5x1.6x2.4, PET in 06/03/2011 was low uptake.  . Paroxysmal atrial fibrillation   . Tachycardia-bradycardia     Past Surgical History  Procedure Laterality Date  . Stomach surgery      3/4 of stomach removed due to stomach cancer  . Hip surgery  2009    right  . Total hip arthroplasty  03/29/2012    Procedure: TOTAL HIP ARTHROPLASTY;  Surgeon: Johnny Bridge, MD;  Location: [place];  Service: Orthopedics;  Laterality: Left;  Left Hip Hemiarthroplasty    Terrace Arabia RD, LDN

## 2014-02-20 NOTE — Interval H&P Note (Signed)
History and Physical Interval Note:  02/20/2014 8:30 AM  [name]  has presented today for surgery, with the diagnosis of dark stools, anemia  The various methods of treatment have been discussed with the patient and family. After consideration of risks, benefits and other options for treatment, the patient has consented to  Procedure(s): ESOPHAGOGASTRODUODENOSCOPY (EGD) (N/A) as a surgical intervention .  The patient's history has been reviewed, patient examined, no change in status, stable for surgery.  I have reviewed the patient's chart and labs.  Questions were answered to the patient's satisfaction.     [name] M  Assessment:  1.  Dark stools. 2.  Anemia. 3.  Coagulopathy, resolved.  Plan:  1.  Endoscopy. 2.  Risks (bleeding, infection, bowel perforation that could require surgery, sedation-related changes in cardiopulmonary systems), benefits (identification and possible treatment of source of symptoms, exclusion of certain causes of symptoms), and alternatives (watchful waiting, radiographic imaging studies, empiric medical treatment) of upper endoscopy (EGD) were explained to patient/family in detail and patient wishes to proceed.  Patient has dementia, consent provided by son.

## 2014-02-20 NOTE — Op Note (Signed)
[facility] [place], [address]   ENDOSCOPY PROCEDURE REPORT  PATIENT: Leslie, Monroe  MR#: [id number] BIRTHDATE: 1926/06/10 , 87  yrs. old GENDER: Female ENDOSCOPIST: Arta Silence, MD REFERRED BY:  [facility] PROCEDURE DATE:  02/20/2014 PROCEDURE:  EGD, diagnostic ASA CLASS:     Class III INDICATIONS:  anemia, dark stools, hemoccult-positive stools. MEDICATIONS: Versed 1mg  IV and Fentanyl 12.5 mcg IV TOPICAL ANESTHETIC: Cetacaine Spray  DESCRIPTION OF PROCEDURE: After the risks benefits and alternatives of the procedure were thoroughly explained, informed consent was obtained.  The Pentax Gastroscope [id number] endoscope was introduced through the mouth and advanced within the efferent jejunal limb, 10cm distal to the gatrojejunal anastomosis. Without limitations. The instrument was slowly withdrawn as the mucosa was fully examined.     Findings:  Esophagus was normal.  Post operative gastric anatomy, small gastric pouch, with appearance of typical Roux-en-Y hook-ups. There was a small ulceration along staple line at gastrojejunal anastomosis, of doubtful clinical significance.  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  The proximal 10cm of the efferent limb was normal.   There was no old or fresh blood to the extent of our examination.   The gastric pouch was very small, thus retroflexion was not performed. The scope was then withdrawn from the patient and the procedure completed.  ENDOSCOPIC IMPRESSION:     As above.  No obvious source of the patient's anemia or dark stools were identified.  I have discussed at length with patient's son, who tells me that her stools weren't significantly different in color than usual.  Her anemia has been intermittent (at least likely in part due to renal disease as well as to malabsorption from her post-gastric anatomy), dating back at least to 2009.   False positive hemoccults are very common in patient's with significant coagulopathy, which she had.  If there were any GI tract contribution to her anemia and hemoccult-positive stools, it would most likely be from diffuse mucosal oozing in setting of coagulopathy.  RECOMMENDATIONS:     1.  Watch for potential complications of procedure. 2.  PPI. 3.  Clear liquid diet, advance as tolerated. 4.  Would hold off on anticoagulation for at least a couple weeks. I would give strong consideration about the risk:benefit profile of anticoagulation at all in this patient, as I feel it is likely that her INR could be difficult to control and a similar clinical presentation could ensue in the future. 5.  Follow CBCs. 6.  No further GI tract evaluation at present is planned.  Patient can follow-up with Dr. Watt Climes, her gastroenterologist, as outpatient who can decide whether colonoscopy would be indicated. 7. [facility] will follow.  eSigned:  Arta Silence, MD 02/20/2014 9:02 AM  CC:

## 2014-02-20 NOTE — H&P (View-Only) (Signed)
Subjective: No complaints.  Objective: Vital signs in last 24 hours: Temp:  [97.4 F (36.3 C)-98.7 F (37.1 C)] 97.7 F (36.5 C) (03/11 1127) Pulse Rate:  [53-62] 58 (03/11 0720) Resp:  [0-25] 10 (03/11 1127) BP: (121-138)/(39-56) 121/56 mmHg (03/11 1127) SpO2:  [94 %-99 %] 96 % (03/11 1127) Weight:  [44.1 kg (97 lb 3.6 oz)] 44.1 kg (97 lb 3.6 oz) (03/11 0500) Weight change:     PE: GEN:  NAD, demented ABD:  Soft  Lab Results: Results for orders placed during the hospital encounter of 02/18/14 (from the past 48 hour(s))  CBC WITH DIFFERENTIAL     Status: Abnormal   Collection Time    02/18/14 12:05 PM      Result Value Ref Range   WBC 8.6  4.0 - 10.5 K/uL   RBC 2.00 (*) 3.87 - 5.11 MIL/uL   Hemoglobin 6.2 (*) 12.0 - 15.0 g/dL   Comment: REPEATED TO VERIFY     CRITICAL RESULT CALLED TO, READ BACK BY AND VERIFIED WITH:     Carmelia Roller RN [id number] 02/18/2014 BY MACEDA, J   HCT 18.4 (*) 36.0 - 46.0 %   MCV 92.0  78.0 - 100.0 fL   MCH 31.0  26.0 - 34.0 pg   MCHC 33.7  30.0 - 36.0 g/dL   RDW 13.9  11.5 - 15.5 %   Platelets 239  150 - 400 K/uL   Neutrophils Relative % 87 (*) 43 - 77 %   Neutro Abs 7.5  1.7 - 7.7 K/uL   Lymphocytes Relative 7 (*) 12 - 46 %   Lymphs Abs 0.6 (*) 0.7 - 4.0 K/uL   Monocytes Relative 6  3 - 12 %   Monocytes Absolute 0.5  0.1 - 1.0 K/uL   Eosinophils Relative 0  0 - 5 %   Eosinophils Absolute 0.0  0.0 - 0.7 K/uL   Basophils Relative 0  0 - 1 %   Basophils Absolute 0.0  0.0 - 0.1 K/uL  BASIC METABOLIC PANEL     Status: Abnormal   Collection Time    02/18/14 12:05 PM      Result Value Ref Range   Sodium 136 (*) 137 - 147 mEq/L   Potassium 3.8  3.7 - 5.3 mEq/L   Chloride 88 (*) 96 - 112 mEq/L   CO2 25  19 - 32 mEq/L   Glucose, Bld 102 (*) 70 - 99 mg/dL   BUN 116 (*) 6 - 23 mg/dL   Creatinine, Ser 7.87 (*) 0.50 - 1.10 mg/dL   Calcium 7.0 (*) 8.4 - 10.5 mg/dL   GFR calc non Af Amer 4 (*) >90 mL/min   GFR calc Af Amer 5 (*) >90 mL/min    Comment: (NOTE)     The eGFR has been calculated using the CKD EPI equation.     This calculation has not been validated in all clinical situations.     eGFR's persistently <90 mL/min signify possible Chronic Kidney     Disease.  URINALYSIS, ROUTINE W REFLEX MICROSCOPIC     Status: Abnormal   Collection Time    02/18/14 12:59 PM      Result Value Ref Range   Color, Urine YELLOW  YELLOW   APPearance TURBID (*) CLEAR   Specific Gravity, Urine 1.013  1.005 - 1.030   pH 7.5  5.0 - 8.0   Glucose, UA NEGATIVE  NEGATIVE mg/dL   Hgb urine dipstick MODERATE (*) NEGATIVE   Bilirubin  Urine NEGATIVE  NEGATIVE   Ketones, ur NEGATIVE  NEGATIVE mg/dL   Protein, ur 100 (*) NEGATIVE mg/dL   Urobilinogen, UA 0.2  0.0 - 1.0 mg/dL   Nitrite NEGATIVE  NEGATIVE   Leukocytes, UA LARGE (*) NEGATIVE  URINE MICROSCOPIC-ADD ON     Status: Abnormal   Collection Time    02/18/14 12:59 PM      Result Value Ref Range   Squamous Epithelial / LPF FEW (*) RARE   WBC, UA TOO NUMEROUS TO COUNT  <3 WBC/hpf   RBC / HPF 3-6  <3 RBC/hpf   Bacteria, UA MANY (*) RARE  SODIUM, URINE, RANDOM     Status: None   Collection Time    02/18/14 12:59 PM      Result Value Ref Range   Sodium, Ur 80    CREATININE, URINE, RANDOM     Status: None   Collection Time    02/18/14 12:59 PM      Result Value Ref Range   Creatinine, Urine 30.24    TYPE AND SCREEN     Status: None   Collection Time    02/18/14  1:22 PM      Result Value Ref Range   ABO/RH(D) B POS     Antibody Screen NEG     Sample Expiration 02/21/2014     Unit Number [id number]     Blood Component Type RED CELLS,LR     Unit division 00     Status of Unit ISSUED,FINAL     Transfusion Status OK TO TRANSFUSE     Crossmatch Result Compatible     Unit Number [id number]     Blood Component Type RED CELLS,LR     Unit division 00     Status of Unit ISSUED,FINAL     Transfusion Status OK TO TRANSFUSE     Crossmatch Result Compatible    PREPARE RBC  (CROSSMATCH)     Status: None   Collection Time    02/18/14  1:22 PM      Result Value Ref Range   Order Confirmation ORDER PROCESSED BY BLOOD BANK    IRON AND TIBC     Status: Abnormal   Collection Time    02/18/14  1:37 PM      Result Value Ref Range   Iron 108  42 - 135 ug/dL   TIBC 146 (*) 250 - 470 ug/dL   Saturation Ratios 74 (*) 20 - 55 %   UIBC 38 (*) 125 - 400 ug/dL   Comment: Performed at [facility]  RETICULOCYTES     Status: Abnormal   Collection Time    02/18/14  1:37 PM      Result Value Ref Range   Retic Ct Pct 2.7  0.4 - 3.1 %   RBC. 2.04 (*) 3.87 - 5.11 MIL/uL   Retic Count, Manual 55.1  19.0 - 186.0 K/uL  VITAMIN B12     Status: Abnormal   Collection Time    02/18/14  1:37 PM      Result Value Ref Range   Vitamin B-12 1915 (*) 211 - 911 pg/mL   Comment: Performed at [facility]     Status: None   Collection Time    02/18/14  1:37 PM      Result Value Ref Range   Folate >20.0     Comment: (NOTE)     Reference Ranges  Deficient:       0.4 - 3.3 ng/mL            Indeterminate:   3.4 - 5.4 ng/mL            Normal:              > 5.4 ng/mL     Performed at [facility]     Status: Abnormal   Collection Time    02/18/14  2:00 PM      Result Value Ref Range   Prothrombin Time 50.8 (*) 11.6 - 15.2 seconds   INR 5.96 (*) 0.00 - 1.49   Comment: REPEATED TO VERIFY     CRITICAL RESULT CALLED TO, READ BACK BY AND VERIFIED WITH:     Carmelia Roller RN [id number] 02/18/14 A BROWNING  POC OCCULT BLOOD, ED     Status: Abnormal   Collection Time    02/18/14  2:08 PM      Result Value Ref Range   Fecal Occult Bld POSITIVE (*) NEGATIVE  PREPARE FRESH FROZEN PLASMA     Status: None   Collection Time    02/18/14  3:10 PM      Result Value Ref Range   Unit Number [phone]     Blood Component Type THAWED PLASMA     Unit division 00     Status of Unit ISSUED,FINAL     Transfusion Status OK TO TRANSFUSE     Unit  Number [id number]     Blood Component Type THAWED PLASMA     Unit division 00     Status of Unit ISSUED     Transfusion Status OK TO TRANSFUSE    MRSA PCR SCREENING     Status: None   Collection Time    02/18/14  6:14 PM      Result Value Ref Range   MRSA by PCR NEGATIVE  NEGATIVE   Comment:            The GeneXpert MRSA Assay (FDA     approved for NASAL specimens     only), is one component of a     comprehensive MRSA colonization     surveillance program. It is not     intended to diagnose MRSA     infection nor to guide or     monitor treatment for     MRSA infections.  HEMOGLOBIN AND HEMATOCRIT, BLOOD     Status: Abnormal   Collection Time    02/18/14  6:30 PM      Result Value Ref Range   Hemoglobin 8.2 (*) 12.0 - 15.0 g/dL   Comment: REPEATED TO VERIFY     POST TRANSFUSION SPECIMEN   HCT 23.9 (*) 36.0 - 46.0 %  HEMOGLOBIN AND HEMATOCRIT, BLOOD     Status: Abnormal   Collection Time    02/19/14  5:21 AM      Result Value Ref Range   Hemoglobin 8.8 (*) 12.0 - 15.0 g/dL   HCT 25.5 (*) 36.0 - 46.0 %  COMPREHENSIVE METABOLIC PANEL     Status: Abnormal   Collection Time    02/19/14  5:21 AM      Result Value Ref Range   Sodium 137  137 - 147 mEq/L   Potassium 3.4 (*) 3.7 - 5.3 mEq/L   Chloride 92 (*) 96 - 112 mEq/L   CO2 21  19 - 32 mEq/L   Glucose, Bld 67 (*) 70 -  99 mg/dL   BUN 118 (*) 6 - 23 mg/dL   Creatinine, Ser 7.76 (*) 0.50 - 1.10 mg/dL   Calcium 6.9 (*) 8.4 - 10.5 mg/dL   Total Protein 5.2 (*) 6.0 - 8.3 g/dL   Albumin 2.1 (*) 3.5 - 5.2 g/dL   AST 48 (*) 0 - 37 U/L   ALT 32  0 - 35 U/L   Alkaline Phosphatase 94  39 - 117 U/L   Total Bilirubin 0.7  0.3 - 1.2 mg/dL   GFR calc non Af Amer 4 (*) >90 mL/min   GFR calc Af Amer 5 (*) >90 mL/min   Comment: (NOTE)     The eGFR has been calculated using the CKD EPI equation.     This calculation has not been validated in all clinical situations.     eGFR's persistently <90 mL/min signify possible Chronic  Kidney     Disease.  PROTIME-INR     Status: None   Collection Time    02/19/14  5:21 AM      Result Value Ref Range   Prothrombin Time 14.6  11.6 - 15.2 seconds   INR 1.16  0.00 - 1.49    Studies/Results: US Renal  02/18/2014   CLINICAL DATA Acute on chronic renal failure, history hypertension  EXAM RENAL/URINARY TRACT ULTRASOUND COMPLETE  COMPARISON CT abdomen and pelvis 05/11/2011  FINDINGS Right Kidney:  Length: 8.2 cm. Minimal cortical thinning. Significantly increased cortical echogenicity. Large shadowing calculi within right kidney largest 10 mm diameter inferior pole. No definite mass or hydronephrosis.  Left Kidney:  Length: 8.8 cm. Normal cortical thickness. Significantly increased cortical echogenicity. No mass, hydronephrosis or shadowing calcification.  Bladder:  Wall appears thickened up to 12 mm thick though this may be in part related to the presence of minimal urine and poor distention. No gross mass lesion.  IMPRESSION Medical renal disease changes without evidence of mass or hydronephrosis.  Nonobstructing right renal calculi to 10 mm diameter.  SIGNATURE  Electronically Signed   By: Lavonia Dana M.D.   On: 02/18/2014 16:59   Dg Chest Port 1 View  02/18/2014   CLINICAL DATA:  Fluid overload.  Abnormal renal labs.  EXAM: PORTABLE CHEST - 1 VIEW  COMPARISON:  DG CHEST 1V PORT dated 08/31/2013; CT CHEST W/O CM dated 03/15/2013  FINDINGS: Cardiopericardial silhouette within normal limits. Mediastinal contours normal. Trachea midline. No airspace disease or effusion. Monitoring leads project over the chest. Bilateral glenohumeral osteoarthritis is present with probable right rotator cuff tear.  IMPRESSION: No active cardiopulmonary disease.   Electronically Signed   By: Dereck Ligas M.D.   On: 02/18/2014 12:34   Assessment:  1.  Anemia, acute on chronic. 2.  Possible ? Dark stools, recently on warfarin. 3.  Coagulopathy, now resolved. 4.  Personal history stomach  cancer.  Plan:  1.  Hold warfarin. 2.  PPI. 3.  Endoscopy tomorrow. 4.  If endoscopy is unrevealing, I would attribute any GI tract blood losses to diffuse mucosal oozing in setting of coagulopathy.  Could always consider further work-up including colonoscopy as outpatient, but given her dementia and comorbidities would like to hold off on this if at all possible. 5.  Risks (bleeding, infection, bowel perforation that could require surgery, sedation-related changes in cardiopulmonary systems), benefits (identification and possible treatment of source of symptoms, exclusion of certain causes of symptoms), and alternatives (watchful waiting, radiographic imaging studies, empiric medical treatment) of upper endoscopy (EGD) were explained to patient/family in detail  and patient wishes to proceed. 6.  Case discussed in detail with her son.   Landry Dyke 02/19/2014, 4:48 PM

## 2014-02-20 NOTE — Progress Notes (Addendum)
[facility] Progress Note [place] TEAM 1 - Stepdown/ICU TEAM   [name] XTG:[id number] DOB: 1926-04-28 DOA: 02/18/2014 PCP: Robyn Haber, MD  Brief narrative: [name] is a 80 y.o. female presenting on 02/18/2014 with  has a past medical history of Dementia; Hypertension; Ulcer; Stomach cancer;  Fracture, intertrochanteric, left femur (03/25/2012); Fracture, intertrochanteric, right femur (06/2008); Nodule of left lung; Paroxysmal atrial fibrillation; and Tachycardia-bradycardia who presents with a Hgb of 6.2 and INR of 5.9. Hemoccult positive.    Subjective: Pt alert. No complaints  Assessment/Plan: Principal Problem:   Upper GI bleed? - Melena per history obtained from pt but she does have dementia and today is unable to tell me about the melena even after I let her know that she is here for a GI bleed.  - have consulted GI  Active Problems:    PAF- now NSR - stable -INR corrected w Vit K- coumadin d/c'd  HTN - start Norvasc    Acute on chronic renal failure - renal following- dialysis if no improvement is seen    Anemia - Hgb 8.8 after 2 U PRBC    UTI - e coli - Rocephin x 3 days  Dementia w/o behavioral disturbances    Code Status: Full code Family Communication: none Disposition Plan: transfer to m/s- PT eval  Consultants: nephrology GI  Procedures: none  Antibiotics: Rocephin 3/10   DVT prophylaxis: SCDs  Objective: Filed Weights   02/18/14 1104 02/19/14 0500 02/20/14 0343  Weight: 41.277 kg (91 lb) 44.1 kg (97 lb 3.6 oz) 46 kg (101 lb 6.6 oz)   Blood pressure 150/54, pulse 59, temperature 97.5 F (36.4 C), temperature source Axillary, resp. rate 9, height 5\' 2"  (1.575 m), weight 46 kg (101 lb 6.6 oz), SpO2 94.00%.  Intake/Output Summary (Last 24 hours) at 02/20/14 1414 Last data filed at 02/20/14 1200  Gross per 24 hour  Intake   2230 ml  Output    800 ml  Net   1430 ml     Exam: General: No acute respiratory  distress Lungs: Clear to auscultation bilaterally without wheezes or crackles Cardiovascular: Regular rate and rhythm without murmur gallop or rub normal S1 and S2 Abdomen: Nontender, nondistended, soft, bowel sounds positive, no rebound, no ascites, no appreciable mass Extremities: No significant cyanosis, clubbing- +  edema bilateral medial ankles  Data Reviewed: Basic Metabolic Panel:  Recent Labs Lab 02/18/14 1205 02/19/14 0521 02/20/14 0220  NA 136* 137 132*  K 3.8 3.4* 3.7  CL 88* 92* 89*  CO2 25 21 20   GLUCOSE 102* 67* 52*  BUN 116* 118* 107*  CREATININE 7.87* 7.76* 7.54*  CALCIUM 7.0* 6.9* 7.4*  PHOS  --   --  7.3*   Liver Function Tests:  Recent Labs Lab 02/17/14 1718 02/19/14 0521 02/20/14 0220  AST 58* 48*  --   ALT 42* 32  --   ALKPHOS 105 94  --   BILITOT 0.8 0.7  --   PROT 5.7* 5.2*  --   ALBUMIN 2.3* 2.1* 1.9*   No results found for this basename: LIPASE, AMYLASE,  in the last 168 hours No results found for this basename: AMMONIA,  in the last 168 hours CBC:  Recent Labs Lab 02/18/14 1205 02/18/14 1830 02/19/14 0521 02/20/14 0220  WBC 8.6  --   --  7.5  NEUTROABS 7.5  --   --   --   HGB 6.2* 8.2* 8.8* 8.9*  HCT 18.4* 23.9* 25.5* 25.8*  MCV 92.0  --   --  86.0  PLT 239  --   --  192   Cardiac Enzymes: No results found for this basename: CKTOTAL, CKMB, CKMBINDEX, TROPONINI,  in the last 168 hours BNP (last 3 results) No results found for this basename: PROBNP,  in the last 8760 hours CBG: No results found for this basename: GLUCAP,  in the last 168 hours  Recent Results (from the past 240 hour(s))  URINE CULTURE     Status: None   Collection Time    02/18/14 12:59 PM      Result Value Ref Range Status   Specimen Description URINE, CATHETERIZED   Final   Special Requests NONE   Final   Culture  Setup Time     Final   Value: 02/19/2014 08:38     Performed at [facility]     Final   Value: >=100,000 COLONIES/ML      Performed at [facility]   Culture     Final   Value: ESCHERICHIA COLI     Performed at [facility]   Report Status PENDING   Incomplete  MRSA PCR SCREENING     Status: None   Collection Time    02/18/14  6:14 PM      Result Value Ref Range Status   MRSA by PCR NEGATIVE  NEGATIVE Final   Comment:            The GeneXpert MRSA Assay (FDA     approved for NASAL specimens     only), is one component of a     comprehensive MRSA colonization     surveillance program. It is not     intended to diagnose MRSA     infection nor to guide or     monitor treatment for     MRSA infections.     Studies:  Recent x-ray studies have been reviewed in detail by the Attending Physician  Scheduled Meds:  Scheduled Meds: . amiodarone  100 mg Oral Daily  . calcitRIOL  0.25 mcg Oral Daily  . calcium acetate  1,334 mg Oral TID WC  . cefTRIAXone (ROCEPHIN)  IV  1 g Intravenous Q24H  . darbepoetin (ARANESP) injection - NON-DIALYSIS  100 mcg Subcutaneous Q Wed-HD  . feeding supplement (NEPRO CARB STEADY)  237 mL Oral BID BM  . levothyroxine  25 mcg Oral QAC breakfast  . pantoprazole  20 mg Oral Daily  . sodium bicarbonate  650 mg Oral QID   Continuous Infusions: . sodium chloride 50 mL/hr at 02/19/14 2019    Time spent on care of this patient: 22 min   Debbe Odea, MD  [facility] Office  [phone] Pager - Text Page per Shea Evans as per below:  On-Call/Text Page:      [email or web address]  If 7PM-7AM, please contact night-coverage [email or web address] 02/20/2014, 2:14 PM   LOS: 2 days

## 2014-02-20 NOTE — Progress Notes (Signed)
S: Appetite better.  No N/V.  Had EGD this AM O:BP 137/47  Pulse 59  Temp(Src) 97.5 F (36.4 C) (Oral)  Resp 9  Ht 5\' 2"  (1.575 m)  Wt 46 kg (101 lb 6.6 oz)  BMI 18.54 kg/m2  SpO2 94%  Intake/Output Summary (Last 24 hours) at 02/20/14 1149 Last data filed at 02/20/14 1130  Gross per 24 hour  Intake   1940 ml  Output    800 ml  Net   1140 ml   Weight change: 4.723 kg (10 lb 6.6 oz) TIR:WERXV and alert CVS:Sl bradycardic but reg Resp:clear Abd:+ BS NTND no HSM Ext:tr edema of feet NEURO:CNI Ox2 no asterixis   . amiodarone  100 mg Oral Daily  . calcitRIOL  0.25 mcg Oral Daily  . calcium acetate  1,334 mg Oral TID WC  . cefTRIAXone (ROCEPHIN)  IV  1 g Intravenous Q24H  . darbepoetin (ARANESP) injection - NON-DIALYSIS  100 mcg Subcutaneous Q Wed-HD  . levothyroxine  25 mcg Oral QAC breakfast  . pantoprazole  20 mg Oral Daily  . sodium bicarbonate  650 mg Oral QID   US Renal  02/18/2014   CLINICAL DATA Acute on chronic renal failure, history hypertension  EXAM RENAL/URINARY TRACT ULTRASOUND COMPLETE  COMPARISON CT abdomen and pelvis 05/11/2011  FINDINGS Right Kidney:  Length: 8.2 cm. Minimal cortical thinning. Significantly increased cortical echogenicity. Large shadowing calculi within right kidney largest 10 mm diameter inferior pole. No definite mass or hydronephrosis.  Left Kidney:  Length: 8.8 cm. Normal cortical thickness. Significantly increased cortical echogenicity. No mass, hydronephrosis or shadowing calcification.  Bladder:  Wall appears thickened up to 12 mm thick though this may be in part related to the presence of minimal urine and poor distention. No gross mass lesion.  IMPRESSION Medical renal disease changes without evidence of mass or hydronephrosis.  Nonobstructing right renal calculi to 10 mm diameter.  SIGNATURE  Electronically Signed   By: Lavonia Dana M.D.   On: 02/18/2014 16:59   Dg Chest Port 1 View  02/18/2014   CLINICAL DATA:  Fluid overload.  Abnormal  renal labs.  EXAM: PORTABLE CHEST - 1 VIEW  COMPARISON:  DG CHEST 1V PORT dated 08/31/2013; CT CHEST W/O CM dated 03/15/2013  FINDINGS: Cardiopericardial silhouette within normal limits. Mediastinal contours normal. Trachea midline. No airspace disease or effusion. Monitoring leads project over the chest. Bilateral glenohumeral osteoarthritis is present with probable right rotator cuff tear.  IMPRESSION: No active cardiopulmonary disease.   Electronically Signed   By: Dereck Ligas M.D.   On: 02/18/2014 12:34   BMET    Component Value Date/Time   NA 132* 02/20/2014 0220   K 3.7 02/20/2014 0220   CL 89* 02/20/2014 0220   CO2 20 02/20/2014 0220   GLUCOSE 52* 02/20/2014 0220   BUN 107* 02/20/2014 0220   CREATININE 7.54* 02/20/2014 0220   CALCIUM 7.4* 02/20/2014 0220   GFRNONAA 4* 02/20/2014 0220   GFRAA 5* 02/20/2014 0220   CBC    Component Value Date/Time   WBC 7.5 02/20/2014 0220   WBC 6.7 09/18/2007 1403   RBC 3.00* 02/20/2014 0220   RBC 2.04* 02/18/2014 1337   RBC 4.12 09/18/2007 1403   HGB 8.9* 02/20/2014 0220   HGB 12.7 09/18/2007 1403   HCT 25.8* 02/20/2014 0220   HCT 37.1 09/18/2007 1403   PLT 192 02/20/2014 0220   PLT 310 09/18/2007 1403   MCV 86.0 02/20/2014 0220   MCV 90.2 09/18/2007 1403  MCH 29.7 02/20/2014 0220   MCH 30.8 09/18/2007 1403   MCHC 34.5 02/20/2014 0220   MCHC 34.1 09/18/2007 1403   RDW 18.1* 02/20/2014 0220   RDW 12.3 09/18/2007 1403   LYMPHSABS 0.6* 02/18/2014 1205   LYMPHSABS 0.8* 09/18/2007 1403   MONOABS 0.5 02/18/2014 1205   MONOABS 0.5 09/18/2007 1403   EOSABS 0.0 02/18/2014 1205   EOSABS 0.0 09/18/2007 1403   BASOSABS 0.0 02/18/2014 1205   BASOSABS 0.1 09/18/2007 1403     Assessment: 1. Acute on CKD 4, UO not impressive, Scr the same 2. GI bleed.  EGD neg 3. Anemia 4. Pyuria, UC pending 5. Sec HPTH on calcitriol 6. Hx  A Fib, NSR now 7. E coli UTI  Plan: 1. Not optimistic she will avoid HD but since she feels well, will cont to watch for now. 2. Cont IV  hydration 3. Advance diet to renal.    [name] T

## 2014-02-21 ENCOUNTER — Encounter (HOSPITAL_COMMUNITY): Payer: Self-pay | Admitting: Gastroenterology

## 2014-02-21 DIAGNOSIS — D689 Coagulation defect, unspecified: Secondary | ICD-10-CM

## 2014-02-21 DIAGNOSIS — R1310 Dysphagia, unspecified: Secondary | ICD-10-CM

## 2014-02-21 DIAGNOSIS — N184 Chronic kidney disease, stage 4 (severe): Secondary | ICD-10-CM

## 2014-02-21 LAB — RENAL FUNCTION PANEL
ALBUMIN: 1.7 g/dL — AB (ref 3.5–5.2)
BUN: 95 mg/dL — AB (ref 6–23)
CALCIUM: 7.9 mg/dL — AB (ref 8.4–10.5)
CO2: 19 mEq/L (ref 19–32)
CREATININE: 7.45 mg/dL — AB (ref 0.50–1.10)
Chloride: 94 mEq/L — ABNORMAL LOW (ref 96–112)
GFR calc Af Amer: 5 mL/min — ABNORMAL LOW (ref >90)
GFR calc non Af Amer: 4 mL/min — ABNORMAL LOW (ref >90)
GLUCOSE: 90 mg/dL (ref 70–99)
POTASSIUM: 4.1 meq/L (ref 3.7–5.3)
Phosphorus: 6.1 mg/dL — ABNORMAL HIGH (ref 2.3–4.6)
Sodium: 132 mEq/L — ABNORMAL LOW (ref 137–147)

## 2014-02-21 NOTE — Progress Notes (Signed)
2120 Report called to the receiving RN on 6E 2215 Pt transferred to [facility] room 26. All belongings went with the pt. Pt transported in the bed accompanied by RN and NT. Receiving RN updated.

## 2014-02-21 NOTE — Clinical Documentation Improvement (Signed)
  Possible Clinical Conditions?       Severe Malnutrition        Severe Protein Calorie Malnutrition      Other Condition  Supporting Information:  Per Nutrition consult on 02/20/14, "Pt meets criteria for severe MALNUTRITION in the context of chronic illness as evidenced by severe fat and muscle wasting."   Diagnostics:  On admission, the patient's BMI was 18.54 kg/(m^2).   Treatment:  Per recommendations of the Registered Dietitian, "Nepro Shake po BID, each supplement provides 425 kcal and 19 grams protein."   Thank You,  Posey Pronto, RN, BSN, [place] Documentation Improvement Specialist HIM department-- Office [phone]

## 2014-02-21 NOTE — Evaluation (Signed)
Clinical/Bedside Swallow Evaluation Patient Details  Name: [name] MRN: [id number] Date of Birth: 28-Sep-1926  Today's Date: 02/21/2014 Time: 1330-1420 SLP Time Calculation (min): 50 min  Past Medical History:  Past Medical History  Diagnosis Date  . Dementia   . Hypertension   . Ulcer     Hx of  . Stomach cancer   . Snoring   . Fracture, intertrochanteric, left femur 03/25/2012  . Fracture, intertrochanteric, right femur 06/2008    s/p closed reduction and internal fixation  . Nodule of left lung     followed by Dr. Elsworth Soho // LLL spiculated nodule noted per CT in 2012 1.5x1.6x2.4, PET in 06/03/2011 was low uptake.  . Paroxysmal atrial fibrillation   . Tachycardia-bradycardia    Past Surgical History:  Past Surgical History  Procedure Laterality Date  . Stomach surgery      3/4 of stomach removed due to stomach cancer  . Hip surgery  2009    right  . Total hip arthroplasty  03/29/2012    Procedure: TOTAL HIP ARTHROPLASTY;  Surgeon: Johnny Bridge, MD;  Location: [facility];  Service: Orthopedics;  Laterality: Left;  Left Hip Hemiarthroplasty  . Esophagogastroduodenoscopy N/A 02/20/2014    Procedure: ESOPHAGOGASTRODUODENOSCOPY (EGD);  Surgeon: Arta Silence, MD;  Location: [facility] ENDOSCOPY;  Service: Endoscopy;  Laterality: N/A;   HPI:  80 year old female admitted 02/18/14 due to abnormal labs/decreased renal function.  PMH significant for CVA (9/14), stomach CA, PUD, AFib, CKD, dementia, HTN, CXR revealed no acute disease.   Assessment / Plan / Recommendation Clinical Impression  Pt able to complete oral care after set up.  Oral motor strength and function appear adequate.  No overt s/s aspiration observed with any consistency.  MBS results from 10/2013 were reviewed with pt/son/daughter.  Precautions posted at [facility].  Will downgrade diet to soft with chopped meats for energy conservation, continue thin liquids with no straws, crush meds.  ST to follow up x1 for diet tolerance and  education.    Aspiration Risk  Mild    Diet Recommendation Dysphagia 3 (Mechanical Soft);Thin liquid   Liquid Administration via: Cup;No straw Medication Administration: Crushed with puree Supervision: Patient able to self feed;Full supervision/cueing for compensatory strategies Compensations: Slow rate;Small sips/bites;Multiple dry swallows after each bite/sip;Clear throat intermittently Postural Changes and/or Swallow Maneuvers: Upright 30-60 min after meal;Seated upright 90 degrees;Chin tuck    Other  Recommendations Oral Care Recommendations: Oral care BID Other Recommendations: Clarify dietary restrictions   Follow Up Recommendations  24 hour supervision/assistance    Frequency and Duration min 1 x/week  1 week   Pertinent Vitals/Pain VSS, no pain reported    SLP Swallow Goals  see care plan   Swallow Study Prior Functional Status  Type of Home: House Available Help at Discharge: Family;Available 24 hours/day    General Date of Onset: 02/18/14 HPI: 80 year old female admitted 02/18/14 due to abnormal labs/decreased renal function.  PMH significant for CVA (9/14), stomach CA, PUD, AFib, CKD, dementia, HTN, CXR revealed no acute disease. Type of Study: Bedside swallow evaluation Previous Swallow Assessment: MBS 10/2013: penetration of thin liquids, chin tuck, no straws, chin tuck and intermittent throat clear prevented/removed penetrate. Regular diet with thin liquids recommended. Diet Prior to this Study: Regular;Thin liquids Temperature Spikes Noted: No History of Recent Intubation: No Behavior/Cognition: Alert;Cooperative;Pleasant mood;Requires cueing Oral Cavity - Dentition: Adequate natural dentition Self-Feeding Abilities: Able to feed self;Needs assist Patient Positioning: Upright in bed Baseline Vocal Quality: Clear Volitional Cough:  Weak Volitional Swallow: Able to elicit    Oral/Motor/Sensory Function Overall Oral Motor/Sensory Function: Appears within  functional limits for tasks assessed   Ice Chips Ice chips: Not tested   Thin Liquid Thin Liquid: Within functional limits Presentation: Self Fed    Nectar Thick Nectar Thick Liquid: Not tested   Honey Thick Honey Thick Liquid: Not tested   Puree Puree: Within functional limits Presentation: Self Fed;Spoon   Solid   GO   Celia B. Quentin Ore [facility], CCC-SLP [phone] Solid: Within functional limits Presentation: [place], Celia Brown 02/21/2014,2:30 PM

## 2014-02-21 NOTE — Progress Notes (Signed)
[facility] Progress Note [place] TEAM 1 - Stepdown/ICU TEAM   [name] EGB:[id number] DOB: [birth year] DOA: 02/18/2014 PCP: Robyn Haber, MD  Brief narrative: [name] is a 80 y.o. female presenting on 02/18/2014 with  has a past medical history of Dementia; Hypertension; Ulcer; Stomach cancer;  Fracture, intertrochanteric, left femur (03/25/2012); Fracture, intertrochanteric, right femur (06/2008); Nodule of left lung; Paroxysmal atrial fibrillation; and Tachycardia-bradycardia who presents with a Hgb of 6.2 and INR of 5.9. Hemoccult positive.    Subjective: Pt alert. No complaints  Assessment/Plan: Principal Problem:   Upper GI bleed? - Melena per history obtained from pt but she does have dementia and today is unable to tell me about the melena even after I let her know that she is here for a GI bleed.  - Patient undergoing EGD which don't reveal an obvious source of bleed. GI recommended holding anticoagulation for several weeks otherwise no further GI workup plan.  Active Problems:    PAF- now NSR - stable -INR corrected w Vit K- coumadin d/c'd    Stage IV chronic kidney disease.  -Nephrology consulted -Currently does not exhibit urine mixed symptoms, suspect hemodialysis will be a future intervention  HTN - Norvasc    Acute on chronic renal failure - renal following- dialysis if no improvement is seen    Anemia - Hgb 8.8 after 2 U PRBC    UTI - e coli - Rocephin x 3 days  Dementia w/o behavioral disturbances    Code Status: Full code Family Communication: none Disposition Plan: transfer to m/s- PT eval  Consultants: nephrology GI  Procedures: none  Antibiotics: Rocephin 3/10   DVT prophylaxis: SCDs  Objective: Filed Weights   02/19/14 0500 02/20/14 0343 02/20/14 2233  Weight: 44.1 kg (97 lb 3.6 oz) 46 kg (101 lb 6.6 oz) 47 kg (103 lb 9.9 oz)   Blood pressure 115/54, pulse 62, temperature 97.6 F (36.4 C), temperature source  Oral, resp. rate 16, height 5\' 2"  (1.575 m), weight 47 kg (103 lb 9.9 oz), SpO2 96.00%.  Intake/Output Summary (Last 24 hours) at 02/21/14 1827 Last data filed at 02/21/14 0524  Gross per 24 hour  Intake    325 ml  Output    250 ml  Net     75 ml     Exam: General: No acute respiratory distress Lungs: Clear to auscultation bilaterally without wheezes or crackles Cardiovascular: Regular rate and rhythm without murmur gallop or rub normal S1 and S2 Abdomen: Nontender, nondistended, soft, bowel sounds positive, no rebound, no ascites, no appreciable mass Extremities: No significant cyanosis, clubbing- +  edema bilateral medial ankles  Data Reviewed: Basic Metabolic Panel:  Recent Labs Lab 02/18/14 1205 02/19/14 0521 02/20/14 0220 02/21/14 0625  NA 136* 137 132* 132*  K 3.8 3.4* 3.7 4.1  CL 88* 92* 89* 94*  CO2 25 21 20 19   GLUCOSE 102* 67* 52* 90  BUN 116* 118* 107* 95*  CREATININE 7.87* 7.76* 7.54* 7.45*  CALCIUM 7.0* 6.9* 7.4* 7.9*  PHOS  --   --  7.3* 6.1*   Liver Function Tests:  Recent Labs Lab 02/17/14 1718 02/19/14 0521 02/20/14 0220 02/21/14 0625  AST 58* 48*  --   --   ALT 42* 32  --   --   ALKPHOS 105 94  --   --   BILITOT 0.8 0.7  --   --   PROT 5.7* 5.2*  --   --   ALBUMIN 2.3* 2.1*  1.9* 1.7*   No results found for this basename: LIPASE, AMYLASE,  in the last 168 hours No results found for this basename: AMMONIA,  in the last 168 hours CBC:  Recent Labs Lab 02/18/14 1205 02/18/14 1830 02/19/14 0521 02/20/14 0220  WBC 8.6  --   --  7.5  NEUTROABS 7.5  --   --   --   HGB 6.2* 8.2* 8.8* 8.9*  HCT 18.4* 23.9* 25.5* 25.8*  MCV 92.0  --   --  86.0  PLT 239  --   --  192   Cardiac Enzymes: No results found for this basename: CKTOTAL, CKMB, CKMBINDEX, TROPONINI,  in the last 168 hours BNP (last 3 results) No results found for this basename: PROBNP,  in the last 8760 hours CBG: No results found for this basename: GLUCAP,  in the last 168  hours  Recent Results (from the past 240 hour(s))  URINE CULTURE     Status: None   Collection Time    02/18/14 12:59 PM      Result Value Ref Range Status   Specimen Description URINE, CATHETERIZED   Final   Special Requests NONE   Final   Culture  Setup Time     Final   Value: 02/19/2014 08:38     Performed at [place]     Final   Value: >=100,000 COLONIES/ML     Performed at [facility]   Culture     Final   Value: ESCHERICHIA COLI     Performed at [facility]   Report Status 02/20/2014 FINAL   Final   Organism ID, Bacteria ESCHERICHIA COLI   Final  MRSA PCR SCREENING     Status: None   Collection Time    02/18/14  6:14 PM      Result Value Ref Range Status   MRSA by PCR NEGATIVE  NEGATIVE Final   Comment:            The GeneXpert MRSA Assay (FDA     approved for NASAL specimens     only), is one component of a     comprehensive MRSA colonization     surveillance program. It is not     intended to diagnose MRSA     infection nor to guide or     monitor treatment for     MRSA infections.     Studies:  Recent x-ray studies have been reviewed in detail by the Attending Physician  Scheduled Meds:  Scheduled Meds: . amiodarone  100 mg Oral Daily  . amLODipine  5 mg Oral Daily  . calcitRIOL  0.25 mcg Oral Daily  . calcium acetate  1,334 mg Oral TID WC  . cefTRIAXone (ROCEPHIN)  IV  1 g Intravenous Q24H  . darbepoetin (ARANESP) injection - NON-DIALYSIS  100 mcg Subcutaneous Q Wed-HD  . feeding supplement (NEPRO CARB STEADY)  237 mL Oral BID BM  . levothyroxine  25 mcg Oral QAC breakfast  . pantoprazole  20 mg Oral Daily  . sodium bicarbonate  650 mg Oral QID   Continuous Infusions: . sodium chloride 50 mL/hr at 02/21/14 1421    Time spent on care of this patient: 35 min   Kelvin Cellar, MD  [facility] Office  [phone] Pager - Text Page per Shea Evans as per below:  On-Call/Text Page:       [email or web address]  If 7PM-7AM, please contact night-coverage [email or web address] 02/21/2014, 6:27 PM  LOS: 3 days

## 2014-02-21 NOTE — Progress Notes (Signed)
S: No N/V  Eating better O:BP 138/51  Pulse 66  Temp(Src) 98.1 F (36.7 C) (Oral)  Resp 16  Ht 5\' 2"  (1.575 m)  Wt 47 kg (103 lb 9.9 oz)  BMI 18.95 kg/m2  SpO2 96%  Intake/Output Summary (Last 24 hours) at 02/21/14 1007 Last data filed at 02/21/14 0524  Gross per 24 hour  Intake    935 ml  Output    675 ml  Net    260 ml   Weight change: 1 kg (2 lb 3.3 oz) EML:JQGBE and alert CVS:RRR Resp:clear Abd:+ BS NTND no HSM Ext: tr edema of feet NEURO:CNI Ox2 no asterixis   . amiodarone  100 mg Oral Daily  . amLODipine  5 mg Oral Daily  . calcitRIOL  0.25 mcg Oral Daily  . calcium acetate  1,334 mg Oral TID WC  . cefTRIAXone (ROCEPHIN)  IV  1 g Intravenous Q24H  . darbepoetin (ARANESP) injection - NON-DIALYSIS  100 mcg Subcutaneous Q Wed-HD  . feeding supplement (NEPRO CARB STEADY)  237 mL Oral BID BM  . levothyroxine  25 mcg Oral QAC breakfast  . pantoprazole  20 mg Oral Daily  . sodium bicarbonate  650 mg Oral QID   No results found. BMET    Component Value Date/Time   NA 132* 02/21/2014 0625   K 4.1 02/21/2014 0625   CL 94* 02/21/2014 0625   CO2 19 02/21/2014 0625   GLUCOSE 90 02/21/2014 0625   BUN 95* 02/21/2014 0625   CREATININE 7.45* 02/21/2014 0625   CALCIUM 7.9* 02/21/2014 0625   GFRNONAA 4* 02/21/2014 0625   GFRAA 5* 02/21/2014 0625   CBC    Component Value Date/Time   WBC 7.5 02/20/2014 0220   WBC 6.7 09/18/2007 1403   RBC 3.00* 02/20/2014 0220   RBC 2.04* 02/18/2014 1337   RBC 4.12 09/18/2007 1403   HGB 8.9* 02/20/2014 0220   HGB 12.7 09/18/2007 1403   HCT 25.8* 02/20/2014 0220   HCT 37.1 09/18/2007 1403   PLT 192 02/20/2014 0220   PLT 310 09/18/2007 1403   MCV 86.0 02/20/2014 0220   MCV 90.2 09/18/2007 1403   MCH 29.7 02/20/2014 0220   MCH 30.8 09/18/2007 1403   MCHC 34.5 02/20/2014 0220   MCHC 34.1 09/18/2007 1403   RDW 18.1* 02/20/2014 0220   RDW 12.3 09/18/2007 1403   LYMPHSABS 0.6* 02/18/2014 1205   LYMPHSABS 0.8* 09/18/2007 1403   MONOABS 0.5 02/18/2014 1205   MONOABS 0.5 09/18/2007 1403   EOSABS 0.0 02/18/2014 1205   EOSABS 0.0 09/18/2007 1403   BASOSABS 0.0 02/18/2014 1205   BASOSABS 0.1 09/18/2007 1403     Assessment: 1. Acute on CKD 4, SCr sl lower, whether this is significant remains to be seen.  Since she does not have uremic Sxs, I would like to see if renal will improve over the weekend 2. GI bleed.  EGD neg.  Hg stable 3. Anemia 4. E coli UTI 5. Sec HPTH on calcitriol 6. Hx  A Fib, NSR now   Plan: 1. Could switch to PO AB ie Amp 500mg  BID 2. Cont IV fluids 3. Re check labs in AM   [name] T

## 2014-02-21 NOTE — Evaluation (Signed)
Physical Therapy Evaluation Patient Details Name: [name] MRN: [id number] DOB: 25-Jan-1926 Today's Date: 02/21/2014 Time: 2440-1027 PT Time Calculation (min): 27 min  PT Assessment / Plan / Recommendation History of Present Illness  [name] is a 80 y.o. female with PMH significant for stroke, stomach cancer, PUD, A fib on coumadin, CKD stage 3 last Cr per our record at 2.16, who was refer to the ED for further evaluation by Dr Deterdine due to worsening renal function ( cr at 7). In the ED patient was found to have hb at 6.2. Guaiac stool positive,    Clinical Impression  Patient presents with problems listed below.  Will benefit from acute PT to maximize independence prior to discharge home with son.    PT Assessment  Patient needs continued PT services    Follow Up Recommendations  Home health PT;Supervision/Assistance - 24 hour    Does the patient have the potential to tolerate intense rehabilitation      Barriers to Discharge        Equipment Recommendations  None recommended by PT    Recommendations for Other Services     Frequency Min 3X/week    Precautions / Restrictions Precautions Precautions: Fall Restrictions Weight Bearing Restrictions: No   Pertinent Vitals/Pain       Mobility  Bed Mobility Overal bed mobility: Needs Assistance Bed Mobility: Supine to Sit Supine to sit: Min assist General bed mobility comments: Verbal cues for technique.  Assist to raise trunk to sitting, and to scoot to EOB Transfers Overall transfer level: Needs assistance Equipment used: Rolling walker (2 wheeled) Transfers: Sit to/from Stand Sit to Stand: Min assist General transfer comment: Verbal cues for hand placement.  Assist for balance during transfer Ambulation/Gait Ambulation/Gait assistance: Min assist Ambulation Distance (Feet): 26 Feet Assistive device: Rolling walker (2 wheeled) Gait Pattern/deviations: Step-through pattern;Decreased stride  length;Shuffle;Trunk flexed Gait velocity interpretation: Below normal speed for age/gender General Gait Details: Verbal cues for safe use of RW, and to stay close to RW.  Patient with shuffle steps with flexed posture.    Exercises     PT Diagnosis: Difficulty walking;Abnormality of gait;Generalized weakness;Altered mental status  PT Problem List: Decreased strength;Decreased activity tolerance;Decreased balance;Decreased mobility;Decreased knowledge of use of DME;Pain PT Treatment Interventions: DME instruction;Gait training;Functional mobility training;Balance training;Patient/family education     PT Goals(Current goals can be found in the care plan section) Acute Rehab PT Goals Patient Stated Goal: Son - for patient to get stronger PT Goal Formulation: With patient Time For Goal Achievement: 02/28/14 Potential to Achieve Goals: Fair  Visit Information  Last PT Received On: 02/21/14 Assistance Needed: +1 History of Present Illness: [name] is a 80 y.o. female with PMH significant for stroke, stomach cancer, PUD, A fib on coumadin, CKD stage 3 last Cr per our record at 2.16, who was refer to the ED for further evaluation by Dr Deterdine due to worsening renal function ( cr at 7). In the ED patient was found to have hb at 6.2. Guaiac stool positive,         Prior Functioning  Home Living Family/patient expects to be discharged to:: Private residence Living Arrangements: Children (Son) Available Help at Discharge: Family;Available 24 hours/day Type of Home: House Home Access: Ramped entrance Home Layout: Two level;Able to live on main level with bedroom/bathroom Home Equipment: Gilford Rile - 2 wheels;Cane - single point;Shower seat Prior Function Level of Independence: Needs assistance Gait / Transfers Assistance Needed: Son assists patient with +1  hand hold assist, or assists patient with RW. ADL's / Homemaking Assistance Needed: assist needed to get into the shower, but not  assist with bathing.  Son does cooking/cleaning.   Communication Communication: No difficulties Dominant Hand: Right    Cognition  Cognition Arousal/Alertness: Awake/alert Behavior During Therapy: WFL for tasks assessed/performed Overall Cognitive Status: History of cognitive impairments - at baseline    Extremity/Trunk Assessment Upper Extremity Assessment Upper Extremity Assessment: Generalized weakness Lower Extremity Assessment Lower Extremity Assessment: Generalized weakness (Feet/toe deformities noted bil. LE's) Cervical / Trunk Assessment Cervical / Trunk Assessment: Kyphotic   Balance    End of Session PT - End of Session Equipment Utilized During Treatment: Gait belt Activity Tolerance: Patient limited by fatigue Patient left: in chair;with call bell/phone within reach Nurse Communication: Mobility status (Up in chair)  GP     Despina Pole 02/21/2014, 11:21 AM Carita Pian. Sanjuana Kava, [place] Pager [phone]

## 2014-02-21 NOTE — Progress Notes (Signed)
Subjective: No voiced complaints. Denies abdominal pain.  Objective: Vital signs in last 24 hours: Temp:  [97.5 F (36.4 C)-98.1 F (36.7 C)] 98.1 F (36.7 C) (03/13 0523) Pulse Rate:  [57-66] 66 (03/13 0523) Resp:  [8-18] 16 (03/13 0523) BP: (127-150)/(47-59) 138/51 mmHg (03/13 0523) SpO2:  [89 %-96 %] 96 % (03/13 0523) Weight:  [47 kg (103 lb 9.9 oz)] 47 kg (103 lb 9.9 oz) (03/12 2233) Weight change: 1 kg (2 lb 3.3 oz) Last BM Date: 02/20/14  PE: GEN:  Chronically confused ABD:  Soft  Lab Results: CBC    Component Value Date/Time   WBC 7.5 02/20/2014 0220   WBC 6.7 09/18/2007 1403   RBC 3.00* 02/20/2014 0220   RBC 2.04* 02/18/2014 1337   RBC 4.12 09/18/2007 1403   HGB 8.9* 02/20/2014 0220   HGB 12.7 09/18/2007 1403   HCT 25.8* 02/20/2014 0220   HCT 37.1 09/18/2007 1403   PLT 192 02/20/2014 0220   PLT 310 09/18/2007 1403   MCV 86.0 02/20/2014 0220   MCV 90.2 09/18/2007 1403   MCH 29.7 02/20/2014 0220   MCH 30.8 09/18/2007 1403   MCHC 34.5 02/20/2014 0220   MCHC 34.1 09/18/2007 1403   RDW 18.1* 02/20/2014 0220   RDW 12.3 09/18/2007 1403   LYMPHSABS 0.6* 02/18/2014 1205   LYMPHSABS 0.8* 09/18/2007 1403   MONOABS 0.5 02/18/2014 1205   MONOABS 0.5 09/18/2007 1403   EOSABS 0.0 02/18/2014 1205   EOSABS 0.0 09/18/2007 1403   BASOSABS 0.0 02/18/2014 1205   BASOSABS 0.1 09/18/2007 1403   CMP     Component Value Date/Time   NA 132* 02/21/2014 0625   K 4.1 02/21/2014 0625   CL 94* 02/21/2014 0625   CO2 19 02/21/2014 0625   GLUCOSE 90 02/21/2014 0625   BUN 95* 02/21/2014 0625   CREATININE 7.45* 02/21/2014 0625   CALCIUM 7.9* 02/21/2014 0625   PROT 5.2* 02/19/2014 0521   ALBUMIN 1.7* 02/21/2014 0625   AST 48* 02/19/2014 0521   ALT 32 02/19/2014 0521   ALKPHOS 94 02/19/2014 0521   BILITOT 0.7 02/19/2014 0521   GFRNONAA 4* 02/21/2014 0625   GFRAA 5* 02/21/2014 0625   Assessment:  1.  Anemia.  Multifactorial (slow GI losses in setting of coagulopathy, renal disease, malabsorption from prior gastric  surgery).  Intermittent anemia dates back upon my chart review to at least 2009. 2.  Dark, hemoccult-positive stools.  Unrevealing EGD. 3.  Acutely worsening chronic kidney disease.  Plan:  1.  Advance diet as tolerated. 2.  PPI indefinitely. 3.  No anticoagulation for a couple weeks.  Do benefits of subsequent anticoagulation outweigh the risks? 4.  Will sign-off; please call with questions; thank you for the consult.   5.  Will arrange outpatient follow-up with her gastroenterology, Dr. Clarene Essex [facility], [phone]) for ongoing management of anemia and discussion of whether or not to pursue colonoscopy (given her age, dementia and other comorbidities, I am inclined not to pursue colonoscopy at present time).   Landry Dyke 02/21/2014, 10:36 AM

## 2014-02-22 DIAGNOSIS — F0390 Unspecified dementia without behavioral disturbance: Secondary | ICD-10-CM

## 2014-02-22 LAB — RENAL FUNCTION PANEL
Albumin: 1.6 g/dL — ABNORMAL LOW (ref 3.5–5.2)
BUN: 89 mg/dL — AB (ref 6–23)
CALCIUM: 8.1 mg/dL — AB (ref 8.4–10.5)
CO2: 20 meq/L (ref 19–32)
CREATININE: 7.71 mg/dL — AB (ref 0.50–1.10)
Chloride: 94 mEq/L — ABNORMAL LOW (ref 96–112)
GFR calc Af Amer: 5 mL/min — ABNORMAL LOW (ref >90)
GFR calc non Af Amer: 4 mL/min — ABNORMAL LOW (ref >90)
Glucose, Bld: 90 mg/dL (ref 70–99)
PHOSPHORUS: 5.4 mg/dL — AB (ref 2.3–4.6)
Potassium: 3.6 mEq/L — ABNORMAL LOW (ref 3.7–5.3)
Sodium: 131 mEq/L — ABNORMAL LOW (ref 137–147)

## 2014-02-22 LAB — HEPATITIS B SURFACE ANTIGEN: Hepatitis B Surface Ag: NEGATIVE

## 2014-02-22 MED ORDER — FUROSEMIDE 80 MG PO TABS
160.0000 mg | ORAL_TABLET | Freq: Three times a day (TID) | ORAL | Status: AC
Start: 2014-02-22 — End: 2014-02-23
  Administered 2014-02-22 – 2014-02-23 (×6): 160 mg via ORAL
  Filled 2014-02-22 (×6): qty 2

## 2014-02-22 NOTE — Progress Notes (Signed)
Spoke with pharmacy; re: Rocephin was scanned as given Friday, 3/13 @ 17:36; medication bag still full.  OK to infuse this bag now, 04:23 on 3/14, per pharmacist.

## 2014-02-22 NOTE — Progress Notes (Signed)
[facility] Progress Note [place] TEAM 1 - Stepdown/ICU TEAM   [name] GBT:[id number] DOB: 1925-12-24 DOA: 02/18/2014 PCP: Robyn Haber, MD  Brief narrative: [name] is a 80 y.o. female presenting on 02/18/2014 with  has a past medical history of Dementia; Hypertension; Ulcer; Stomach cancer;  Fracture, intertrochanteric, left femur (03/25/2012); Fracture, intertrochanteric, right femur (06/2008); Nodule of left lung; Paroxysmal atrial fibrillation; and Tachycardia-bradycardia who presents with a Hgb of 6.2 and INR of 5.9. Hemoccult positive.    Subjective: Pt alert. No complaints  Assessment/Plan: Principal Problem:   Upper GI bleed? - Melena per history obtained from pt but she does have dementia and today is unable to tell me about the melena even after I let her know that she is here for a GI bleed.  - Patient undergoing EGD which don't reveal an obvious source of bleed. GI recommended holding anticoagulation for several weeks otherwise no further GI workup plan.  Active Problems:    PAF- now NSR - stable -INR corrected w Vit K- coumadin d/c'd    Stage IV chronic kidney disease.  -Nephrology consulted -Currently does not exhibit urine mixed symptoms, suspect hemodialysis will be a future intervention  HTN - Norvasc     Anemia - Hgb 8.8 after 2 U PRBC    UTI - e coli - Rocephin x 3 days  Dementia w/o behavioral disturbances    Code Status: Full code Family Communication: none Disposition Plan: transfer to m/s- PT eval  Consultants: nephrology GI  Procedures: none  Antibiotics: Rocephin 3/10   DVT prophylaxis: SCDs  Objective: Filed Weights   02/20/14 2233 02/21/14 2110 02/21/14 2211  Weight: 47 kg (103 lb 9.9 oz) 49.8 kg (109 lb 12.6 oz) 48 kg (105 lb 13.1 oz)   Blood pressure 133/49, pulse 55, temperature 97.5 F (36.4 C), temperature source Oral, resp. rate 15, height 5\' 2"  (1.575 m), weight 48 kg (105 lb 13.1 oz), SpO2  98.00%.  Intake/Output Summary (Last 24 hours) at 02/22/14 1855 Last data filed at 02/22/14 1837  Gross per 24 hour  Intake    675 ml  Output    850 ml  Net   -175 ml     Exam: General: No acute respiratory distress Lungs: Clear to auscultation bilaterally without wheezes or crackles Cardiovascular: Regular rate and rhythm without murmur gallop or rub normal S1 and S2 Abdomen: Nontender, nondistended, soft, bowel sounds positive, no rebound, no ascites, no appreciable mass Extremities: No significant cyanosis, clubbing- +  edema bilateral medial ankles  Data Reviewed: Basic Metabolic Panel:  Recent Labs Lab 02/18/14 1205 02/19/14 0521 02/20/14 0220 02/21/14 0625 02/22/14 0617  NA 136* 137 132* 132* 131*  K 3.8 3.4* 3.7 4.1 3.6*  CL 88* 92* 89* 94* 94*  CO2 25 21 20 19 20   GLUCOSE 102* 67* 52* 90 90  BUN 116* 118* 107* 95* 89*  CREATININE 7.87* 7.76* 7.54* 7.45* 7.71*  CALCIUM 7.0* 6.9* 7.4* 7.9* 8.1*  PHOS  --   --  7.3* 6.1* 5.4*   Liver Function Tests:  Recent Labs Lab 02/17/14 1718 02/19/14 0521 02/20/14 0220 02/21/14 0625 02/22/14 0617  AST 58* 48*  --   --   --   ALT 42* 32  --   --   --   ALKPHOS 105 94  --   --   --   BILITOT 0.8 0.7  --   --   --   PROT 5.7* 5.2*  --   --   --  ALBUMIN 2.3* 2.1* 1.9* 1.7* 1.6*   No results found for this basename: LIPASE, AMYLASE,  in the last 168 hours No results found for this basename: AMMONIA,  in the last 168 hours CBC:  Recent Labs Lab 02/18/14 1205 02/18/14 1830 02/19/14 0521 02/20/14 0220  WBC 8.6  --   --  7.5  NEUTROABS 7.5  --   --   --   HGB 6.2* 8.2* 8.8* 8.9*  HCT 18.4* 23.9* 25.5* 25.8*  MCV 92.0  --   --  86.0  PLT 239  --   --  192   Cardiac Enzymes: No results found for this basename: CKTOTAL, CKMB, CKMBINDEX, TROPONINI,  in the last 168 hours BNP (last 3 results) No results found for this basename: PROBNP,  in the last 8760 hours CBG: No results found for this basename: GLUCAP,   in the last 168 hours  Recent Results (from the past 240 hour(s))  URINE CULTURE     Status: None   Collection Time    02/18/14 12:59 PM      Result Value Ref Range Status   Specimen Description URINE, CATHETERIZED   Final   Special Requests NONE   Final   Culture  Setup Time     Final   Value: 02/19/2014 08:38     Performed at [place]     Final   Value: >=100,000 COLONIES/ML     Performed at [facility]   Culture     Final   Value: ESCHERICHIA COLI     Performed at [facility]   Report Status 02/20/2014 FINAL   Final   Organism ID, Bacteria ESCHERICHIA COLI   Final  MRSA PCR SCREENING     Status: None   Collection Time    02/18/14  6:14 PM      Result Value Ref Range Status   MRSA by PCR NEGATIVE  NEGATIVE Final   Comment:            The GeneXpert MRSA Assay (FDA     approved for NASAL specimens     only), is one component of a     comprehensive MRSA colonization     surveillance program. It is not     intended to diagnose MRSA     infection nor to guide or     monitor treatment for     MRSA infections.     Studies:  Recent x-ray studies have been reviewed in detail by the Attending Physician  Scheduled Meds:  Scheduled Meds: . amiodarone  100 mg Oral Daily  . amLODipine  5 mg Oral Daily  . calcitRIOL  0.25 mcg Oral Daily  . calcium acetate  1,334 mg Oral TID WC  . cefTRIAXone (ROCEPHIN)  IV  1 g Intravenous Q24H  . darbepoetin (ARANESP) injection - NON-DIALYSIS  100 mcg Subcutaneous Q Wed-HD  . feeding supplement (NEPRO CARB STEADY)  237 mL Oral BID BM  . furosemide  160 mg Oral TID  . levothyroxine  25 mcg Oral QAC breakfast  . pantoprazole  20 mg Oral Daily  . sodium bicarbonate  650 mg Oral QID   Continuous Infusions: . sodium chloride 50 mL/hr at 02/22/14 0453    Time spent on care of this patient: 35 min   Kelvin Cellar, MD  [facility] Office  [phone] Pager - Text Page per Shea Evans as  per below:  On-Call/Text Page:  [email or web address]  If 7PM-7AM, please contact night-coverage [email or web address] 02/22/2014, 6:55 PM   LOS: 4 days

## 2014-02-22 NOTE — Progress Notes (Signed)
S: No N/V   O:BP 119/56  Pulse 59  Temp(Src) 98.2 F (36.8 C) (Oral)  Resp 18  Ht 5\' 2"  (1.575 m)  Wt 48 kg (105 lb 13.1 oz)  BMI 19.35 kg/m2  SpO2 95%  Intake/Output Summary (Last 24 hours) at 02/22/14 0857 Last data filed at 02/22/14 0700  Gross per 24 hour  Intake    575 ml  Output    700 ml  Net   -125 ml   Weight change: 2.8 kg (6 lb 2.8 oz) XHB:ZJIRC and alert CVS:RRR Resp:clear Abd:+ BS NTND no HSM Ext: tr edema of feet NEURO:CNI Ox2 no asterixis   . amiodarone  100 mg Oral Daily  . amLODipine  5 mg Oral Daily  . calcitRIOL  0.25 mcg Oral Daily  . calcium acetate  1,334 mg Oral TID WC  . cefTRIAXone (ROCEPHIN)  IV  1 g Intravenous Q24H  . darbepoetin (ARANESP) injection - NON-DIALYSIS  100 mcg Subcutaneous Q Wed-HD  . feeding supplement (NEPRO CARB STEADY)  237 mL Oral BID BM  . levothyroxine  25 mcg Oral QAC breakfast  . pantoprazole  20 mg Oral Daily  . sodium bicarbonate  650 mg Oral QID   No results found. BMET    Component Value Date/Time   NA 131* 02/22/2014 0617   K 3.6* 02/22/2014 0617   CL 94* 02/22/2014 0617   CO2 20 02/22/2014 0617   GLUCOSE 90 02/22/2014 0617   BUN 89* 02/22/2014 0617   CREATININE 7.71* 02/22/2014 0617   CALCIUM 8.1* 02/22/2014 0617   GFRNONAA 4* 02/22/2014 0617   GFRAA 5* 02/22/2014 0617   CBC    Component Value Date/Time   WBC 7.5 02/20/2014 0220   WBC 6.7 09/18/2007 1403   RBC 3.00* 02/20/2014 0220   RBC 2.04* 02/18/2014 1337   RBC 4.12 09/18/2007 1403   HGB 8.9* 02/20/2014 0220   HGB 12.7 09/18/2007 1403   HCT 25.8* 02/20/2014 0220   HCT 37.1 09/18/2007 1403   PLT 192 02/20/2014 0220   PLT 310 09/18/2007 1403   MCV 86.0 02/20/2014 0220   MCV 90.2 09/18/2007 1403   MCH 29.7 02/20/2014 0220   MCH 30.8 09/18/2007 1403   MCHC 34.5 02/20/2014 0220   MCHC 34.1 09/18/2007 1403   RDW 18.1* 02/20/2014 0220   RDW 12.3 09/18/2007 1403   LYMPHSABS 0.6* 02/18/2014 1205   LYMPHSABS 0.8* 09/18/2007 1403   MONOABS 0.5 02/18/2014 1205   MONOABS 0.5  09/18/2007 1403   EOSABS 0.0 02/18/2014 1205   EOSABS 0.0 09/18/2007 1403   BASOSABS 0.0 02/18/2014 1205   BASOSABS 0.1 09/18/2007 1403     Assessment: 1. Acute on CKD 4, Scr at same level as admission and thus I think she will need HD 2. GI bleed.  EGD neg.  Hg stable 3. Anemia 4. E coli UTI 5. Sec HPTH on calcitriol 6. Hx  A Fib, NSR now   Plan: 1. Could switch to PO AB ie Amp 500mg  BID 2. Discussed the reality of HD with pt and son.  I will give her lasix today to see if can stimulate some UO but realistically I think we are looking at the need for HD.   My plan will be to get PC placed on Mon and start HD. 3. Will order vein mapping.  Sumedha Munnerlyn T

## 2014-02-23 DIAGNOSIS — I10 Essential (primary) hypertension: Secondary | ICD-10-CM

## 2014-02-23 DIAGNOSIS — N19 Unspecified kidney failure: Secondary | ICD-10-CM

## 2014-02-23 DIAGNOSIS — N185 Chronic kidney disease, stage 5: Secondary | ICD-10-CM

## 2014-02-23 LAB — BASIC METABOLIC PANEL
BUN: 97 mg/dL — AB (ref 6–23)
CHLORIDE: 91 meq/L — AB (ref 96–112)
CO2: 20 meq/L (ref 19–32)
CREATININE: 7.75 mg/dL — AB (ref 0.50–1.10)
Calcium: 8 mg/dL — ABNORMAL LOW (ref 8.4–10.5)
GFR calc non Af Amer: 4 mL/min — ABNORMAL LOW (ref >90)
GFR, EST AFRICAN AMERICAN: 5 mL/min — AB (ref >90)
Glucose, Bld: 96 mg/dL (ref 70–99)
POTASSIUM: 3.6 meq/L — AB (ref 3.7–5.3)
Sodium: 130 mEq/L — ABNORMAL LOW (ref 137–147)

## 2014-02-23 LAB — CBC
HEMATOCRIT: 27.5 % — AB (ref 36.0–46.0)
Hemoglobin: 9.4 g/dL — ABNORMAL LOW (ref 12.0–15.0)
MCH: 30 pg (ref 26.0–34.0)
MCHC: 34.2 g/dL (ref 30.0–36.0)
MCV: 87.9 fL (ref 78.0–100.0)
Platelets: 156 10*3/uL (ref 150–400)
RBC: 3.13 MIL/uL — ABNORMAL LOW (ref 3.87–5.11)
RDW: 16.8 % — AB (ref 11.5–15.5)
WBC: 6.8 10*3/uL (ref 4.0–10.5)

## 2014-02-23 MED ORDER — DOXERCALCIFEROL 4 MCG/2ML IV SOLN
2.0000 ug | INTRAVENOUS | Status: DC
  Administered 2014-02-26 – 2014-03-03 (×3): 2 ug via INTRAVENOUS
  Filled 2014-02-23 (×4): qty 2

## 2014-02-23 MED ORDER — RENA-VITE PO TABS
1.0000 | ORAL_TABLET | Freq: Every day | ORAL | Status: DC
  Administered 2014-02-23 – 2014-02-26 (×3): 1 via ORAL
  Administered 2014-02-27: 21:00:00 via ORAL
  Administered 2014-02-28 – 2014-03-03 (×4): 1 via ORAL
  Filled 2014-02-23 (×10): qty 1

## 2014-02-23 MED ORDER — CEFUROXIME SODIUM 1.5 G IJ SOLR
1.5000 g | INTRAMUSCULAR | Status: AC
  Administered 2014-02-24: 1.5 g via INTRAVENOUS
  Filled 2014-02-23: qty 1.5

## 2014-02-23 MED ORDER — DARBEPOETIN ALFA-POLYSORBATE 100 MCG/0.5ML IJ SOLN
100.0000 ug | INTRAMUSCULAR | Status: DC
  Administered 2014-02-26: 100 ug via INTRAVENOUS
  Filled 2014-02-23: qty 0.5

## 2014-02-23 NOTE — Progress Notes (Signed)
S: No new CO  O:BP 122/44  Pulse 64  Temp(Src) 97.3 F (36.3 C) (Oral)  Resp 16  Ht 5\' 4"  (1.626 m)  Wt 50.2 kg (110 lb 10.7 oz)  BMI 18.99 kg/m2  SpO2 94%  Intake/Output Summary (Last 24 hours) at 02/23/14 0913 Last data filed at 02/23/14 0700  Gross per 24 hour  Intake    555 ml  Output    850 ml  Net   -295 ml   Weight change: 0.4 kg (14.1 oz) YWV:PXTGG and alert CVS:RRR Resp:clear Abd:+ BS NTND no HSM Ext: tr edema of feet NEURO:CNI Ox2 no asterixis   . amiodarone  100 mg Oral Daily  . amLODipine  5 mg Oral Daily  . calcitRIOL  0.25 mcg Oral Daily  . calcium acetate  1,334 mg Oral TID WC  . cefTRIAXone (ROCEPHIN)  IV  1 g Intravenous Q24H  . darbepoetin (ARANESP) injection - NON-DIALYSIS  100 mcg Subcutaneous Q Wed-HD  . feeding supplement (NEPRO CARB STEADY)  237 mL Oral BID BM  . furosemide  160 mg Oral TID  . levothyroxine  25 mcg Oral QAC breakfast  . pantoprazole  20 mg Oral Daily  . sodium bicarbonate  650 mg Oral QID   No results found. BMET    Component Value Date/Time   NA 130* 02/23/2014 0626   K 3.6* 02/23/2014 0626   CL 91* 02/23/2014 0626   CO2 20 02/23/2014 0626   GLUCOSE 96 02/23/2014 0626   BUN 97* 02/23/2014 0626   CREATININE 7.75* 02/23/2014 0626   CALCIUM 8.0* 02/23/2014 0626   GFRNONAA 4* 02/23/2014 0626   GFRAA 5* 02/23/2014 0626   CBC    Component Value Date/Time   WBC 6.8 02/23/2014 0626   WBC 6.7 09/18/2007 1403   RBC 3.13* 02/23/2014 0626   RBC 2.04* 02/18/2014 1337   RBC 4.12 09/18/2007 1403   HGB 9.4* 02/23/2014 0626   HGB 12.7 09/18/2007 1403   HCT 27.5* 02/23/2014 0626   HCT 37.1 09/18/2007 1403   PLT 156 02/23/2014 0626   PLT 310 09/18/2007 1403   MCV 87.9 02/23/2014 0626   MCV 90.2 09/18/2007 1403   MCH 30.0 02/23/2014 0626   MCH 30.8 09/18/2007 1403   MCHC 34.2 02/23/2014 0626   MCHC 34.1 09/18/2007 1403   RDW 16.8* 02/23/2014 0626   RDW 12.3 09/18/2007 1403   LYMPHSABS 0.6* 02/18/2014 1205   LYMPHSABS 0.8* 09/18/2007 1403   MONOABS  0.5 02/18/2014 1205   MONOABS 0.5 09/18/2007 1403   EOSABS 0.0 02/18/2014 1205   EOSABS 0.0 09/18/2007 1403   BASOSABS 0.0 02/18/2014 1205   BASOSABS 0.1 09/18/2007 1403     Assessment: 1. Acute on CKD 4, Scr at same level as admission and thus I think she will need HD 2. GI bleed.  EGD neg.  Hg stable 3. Anemia 4. E coli UTI 5. Sec HPTH on calcitriol 6. Hx  A Fib, NSR now   Plan: 1.Plan Permcath +/- AVF in AM then HD.   2. Start clip process in AM   Kenadi Miltner T

## 2014-02-23 NOTE — Progress Notes (Signed)
VASCULAR LAB PRELIMINARY  PRELIMINARY  PRELIMINARY  PRELIMINARY  Right  Upper Extremity Vein Map    Cephalic  Segment Diameter Depth Comment  1. Axilla 3.3 mm mm   2. Mid upper arm 2.7 mm mm   3. Above AC 3.0 mm mm   4. In AC 3.1 mm mm   5. Below AC 2.7 mm mm   6. Mid forearm 2.5 mm mm   7. Wrist mm mm IV   mm mm    mm mm    mm mm    Basilic  Segment Diameter Depth Comment  1. Mid upper arm 4.2 mm 6.4 mm   2. Above AC 3.8 mm 5.4 mm   3. In AC mm mm Too small to measure  4. Below AC mm mm Too small to measure                                 Left Upper Extremity Vein Map    Cephalic  Segment Diameter Depth Comment  1. Axilla mm mm Too small to measure  2. Mid upper arm 1.3 mm mm   3. Above AC mm mm Thrombus  4. In AC 2.2 mm mm   5. Below AC mm mm Too small to measure  6. Mid forearm mm mm Too small to measure  7. Wrist mm mm Too small to measure   mm mm    mm mm    mm mm    Basilic  Segment Diameter Depth Comment  1. Mid upper arm 5.0 mm 8.3 mm   2. Above AC 3.0 mm 6.5 mm   3. In AC 2.1 mm 7.3 mm Branch   4. Below AC mm mm Too small to measure                       Britnay Magnussen, RVT 02/23/2014, 2:53 PM

## 2014-02-23 NOTE — Progress Notes (Signed)
[facility] Progress Note [place] TEAM 1 - Stepdown/ICU TEAM   MARVETTE SCHAMP GGY:[id number] DOB: 03-17-1926 DOA: 02/18/2014 PCP: Robyn Haber, MD  Brief narrative: [name] is a 80 y.o. female presenting on 02/18/2014 with  has a past medical history of Dementia; Hypertension; Ulcer; Stomach cancer;  Fracture, intertrochanteric, left femur (03/25/2012); Fracture, intertrochanteric, right femur (06/2008); Nodule of left lung; Paroxysmal atrial fibrillation; and Tachycardia-bradycardia who presents with a Hgb of 6.2 and INR of 5.9. Hemoccult positive.    Subjective: Pt alert. No complaints  Assessment/Plan: Principal Problem:   Upper GI bleed? - Stable, no clinical evidence of GI bleed - Patient undergoing EGD which don't reveal an obvious source of bleed. GI recommended holding anticoagulation for several weeks otherwise no further GI workup plan.  Active Problems:    PAF- now NSR - stable -Anticoagulation stopped given possible GI bleed    Stage IV chronic kidney disease.  -Nephrology consulted -Case discussed with Nephrology, will likely have perm cath placement in am and start HD   HTN - Norvasc     Anemia - Hgb 8.8 after 2 U PRBC    UTI - e coli -Rocephin discontinued.   Dementia w/o behavioral disturbances    Code Status: Full code Family Communication: none Disposition Plan: transfer to m/s- PT eval  Consultants: nephrology GI  Procedures: none  Antibiotics: Rocephin 3/10   DVT prophylaxis: SCDs  Objective: Filed Weights   02/21/14 2110 02/21/14 2211 02/23/14 0232  Weight: 49.8 kg (109 lb 12.6 oz) 48 kg (105 lb 13.1 oz) 50.2 kg (110 lb 10.7 oz)   Blood pressure 114/49, pulse 66, temperature 97.6 F (36.4 C), temperature source Oral, resp. rate 18, height 5\' 4"  (1.626 m), weight 50.2 kg (110 lb 10.7 oz), SpO2 95.00%.  Intake/Output Summary (Last 24 hours) at 02/23/14 1453 Last data filed at 02/23/14 0942  Gross per 24 hour   Intake    575 ml  Output    850 ml  Net   -275 ml     Exam: General: No acute respiratory distress Lungs: Clear to auscultation bilaterally without wheezes or crackles Cardiovascular: Regular rate and rhythm without murmur gallop or rub normal S1 and S2 Abdomen: Nontender, nondistended, soft, bowel sounds positive, no rebound, no ascites, no appreciable mass Extremities: No significant cyanosis, clubbing- +  edema bilateral medial ankles  Data Reviewed: Basic Metabolic Panel:  Recent Labs Lab 02/19/14 0521 02/20/14 0220 02/21/14 0625 02/22/14 0617 02/23/14 0626  NA 137 132* 132* 131* 130*  K 3.4* 3.7 4.1 3.6* 3.6*  CL 92* 89* 94* 94* 91*  CO2 21 20 19 20 20   GLUCOSE 67* 52* 90 90 96  BUN 118* 107* 95* 89* 97*  CREATININE 7.76* 7.54* 7.45* 7.71* 7.75*  CALCIUM 6.9* 7.4* 7.9* 8.1* 8.0*  PHOS  --  7.3* 6.1* 5.4*  --    Liver Function Tests:  Recent Labs Lab 02/17/14 1718 02/19/14 0521 02/20/14 0220 02/21/14 0625 02/22/14 0617  AST 58* 48*  --   --   --   ALT 42* 32  --   --   --   ALKPHOS 105 94  --   --   --   BILITOT 0.8 0.7  --   --   --   PROT 5.7* 5.2*  --   --   --   ALBUMIN 2.3* 2.1* 1.9* 1.7* 1.6*   No results found for this basename: LIPASE, AMYLASE,  in the last 168 hours No  results found for this basename: AMMONIA,  in the last 168 hours CBC:  Recent Labs Lab 02/18/14 1205 02/18/14 1830 02/19/14 0521 02/20/14 0220 02/23/14 0626  WBC 8.6  --   --  7.5 6.8  NEUTROABS 7.5  --   --   --   --   HGB 6.2* 8.2* 8.8* 8.9* 9.4*  HCT 18.4* 23.9* 25.5* 25.8* 27.5*  MCV 92.0  --   --  86.0 87.9  PLT 239  --   --  192 156   Cardiac Enzymes: No results found for this basename: CKTOTAL, CKMB, CKMBINDEX, TROPONINI,  in the last 168 hours BNP (last 3 results) No results found for this basename: PROBNP,  in the last 8760 hours CBG: No results found for this basename: GLUCAP,  in the last 168 hours  Recent Results (from the past 240 hour(s))  URINE  CULTURE     Status: None   Collection Time    02/18/14 12:59 PM      Result Value Ref Range Status   Specimen Description URINE, CATHETERIZED   Final   Special Requests NONE   Final   Culture  Setup Time     Final   Value: 02/19/2014 08:38     Performed at [place]     Final   Value: >=100,000 COLONIES/ML     Performed at [facility]   Culture     Final   Value: ESCHERICHIA COLI     Performed at [facility]   Report Status 02/20/2014 FINAL   Final   Organism ID, Bacteria ESCHERICHIA COLI   Final  MRSA PCR SCREENING     Status: None   Collection Time    02/18/14  6:14 PM      Result Value Ref Range Status   MRSA by PCR NEGATIVE  NEGATIVE Final   Comment:            The GeneXpert MRSA Assay (FDA     approved for NASAL specimens     only), is one component of a     comprehensive MRSA colonization     surveillance program. It is not     intended to diagnose MRSA     infection nor to guide or     monitor treatment for     MRSA infections.     Studies:  Recent x-ray studies have been reviewed in detail by the Attending Physician  Scheduled Meds:  Scheduled Meds: . amiodarone  100 mg Oral Daily  . amLODipine  5 mg Oral Daily  . calcium acetate  1,334 mg Oral TID WC  . cefTRIAXone (ROCEPHIN)  IV  1 g Intravenous Q24H  . [START ON 02/24/2014] cefUROXime (ZINACEF)  IV  1.5 g Intravenous On Call to OR  . [START ON 02/26/2014] darbepoetin (ARANESP) injection - DIALYSIS  100 mcg Intravenous Q Wed-HD  . [START ON 02/24/2014] doxercalciferol  2 mcg Intravenous Q M,W,F-HD  . feeding supplement (NEPRO CARB STEADY)  237 mL Oral BID BM  . furosemide  160 mg Oral TID  . levothyroxine  25 mcg Oral QAC breakfast  . multivitamin  1 tablet Oral QHS  . pantoprazole  20 mg Oral Daily   Continuous Infusions: . sodium chloride 50 mL/hr at 02/23/14 3825    Time spent on care of this patient: 35 min   Kelvin Cellar, MD  [facility] Office  [phone] Pager - Text Page per Shea Evans  as per below:  On-Call/Text Page:      [email or web address]  If 7PM-7AM, please contact night-coverage [email or web address] 02/23/2014, 2:53 PM   LOS: 5 days

## 2014-02-23 NOTE — Consult Note (Addendum)
Consult Note  Patient name: [name] MRN: [id number] DOB: 02/10/1926 Sex: female  Consulting Physician:  Nephrology  Reason for Consult:  Chief Complaint  Patient presents with  . Abnormal Lab    HISTORY OF PRESENT ILLNESS: This is a very pleasant 80 year old female who is right-handed, that I have been asked to evaluate for permanent access.  The plan is to initiate dialysis tomorrow.  Vein mapping has yet to be performed.  The patient was admitted for worsening renal function.  In the emergency department her hemoglobin was 6.2 and she was guaiac stool positive.  At that time, her INR was 5.9.  She is on anticoagulation for paroxysmal atrial fibrillation.  She is on a statin for hypercholesterolemia.  She has a history of smoking but quit 40 years ago.  Past Medical History  Diagnosis Date  . Dementia   . Hypertension   . Ulcer     Hx of  . Stomach cancer   . Snoring   . Fracture, intertrochanteric, left femur 03/25/2012  . Fracture, intertrochanteric, right femur 06/2008    s/p closed reduction and internal fixation  . Nodule of left lung     followed by Dr. Elsworth Soho // LLL spiculated nodule noted per CT in 2012 1.5x1.6x2.4, PET in 06/03/2011 was low uptake.  . Paroxysmal atrial fibrillation   . Tachycardia-bradycardia     Past Surgical History  Procedure Laterality Date  . Stomach surgery      3/4 of stomach removed due to stomach cancer  . Hip surgery  2009    right  . Total hip arthroplasty  03/29/2012    Procedure: TOTAL HIP ARTHROPLASTY;  Surgeon: Johnny Bridge, MD;  Location: [facility];  Service: Orthopedics;  Laterality: Left;  Left Hip Hemiarthroplasty  . Esophagogastroduodenoscopy N/A 02/20/2014    Procedure: ESOPHAGOGASTRODUODENOSCOPY (EGD);  Surgeon: Arta Silence, MD;  Location: [facility] ENDOSCOPY;  Service: Endoscopy;  Laterality: N/A;    History   Social History  . Marital Status: Married    Spouse Name: N/A    Number of Children: 2  . Years of  Education: undergrad   Occupational History  . retired    Social History Main Topics  . Smoking status: Former Smoker -- 0.30 packs/day for 2 years    Types: Cigarettes    Quit date: 12/12/1973  . Smokeless tobacco: Never Used  . Alcohol Use: Yes     Comment: rare  . Drug Use: Not on file  . Sexual Activity: Not on file   Other Topics Concern  . Not on file   Social History Narrative   Patient is right handed and resides with son    Family History  Problem Relation Age of Onset  . Cancer Father   . Liver cancer Son   . Allergies      Allergies as of 02/18/2014  . (No Known Allergies)    No current facility-administered medications on file prior to encounter.   Current Outpatient Prescriptions on File Prior to Encounter  Medication Sig Dispense Refill  . atorvastatin (LIPITOR) 40 MG tablet Take 40 mg by mouth daily at 6 PM.       . Cyanocobalamin (VITAMIN B 12 PO) Take 1 capsule by mouth 3 (three) times a week.       . IRON PO Take 1 capsule twice daily (ferrous)      . levothyroxine (SYNTHROID) 25 MCG tablet Take 1 tablet (25 mcg total)  by mouth daily before breakfast.  30 tablet  3  . Multiple Vitamin (MULTIVITAMIN) capsule Take 1 capsule by mouth daily.         REVIEW OF SYSTEMS: Please see history of present illness, no significant changes  PHYSICAL EXAMINATION: General: The patient appears their stated age.  Vital signs are BP 122/44  Pulse 64  Temp(Src) 97.3 F (36.3 C) (Oral)  Resp 16  Ht 5\' 4"  (1.626 m)  Wt 110 lb 10.7 oz (50.2 kg)  BMI 18.99 kg/m2  SpO2 94% Pulmonary: Respirations are non-labored HEENT:  No gross abnormalities Abdomen: Soft and non-tender  Musculoskeletal: There are no major deformities.   Neurologic: No focal weakness or paresthesias are detected, Skin: There are no ulcer or rashes noted. Psychiatric: The patient has normal affect. Cardiovascular: There is a regular rate and rhythm without significant murmur appreciated.   Palpable left radial pulse.  There is an IV in her left upper arm.  Diagnostic Studies: Vein mapping is pending   Assessment:  Stage 5 renal disease Plan: I had a lengthy conversation with the patient and her son.  She will be scheduled for a tunneled dialysis catheter tomorrow morning.  I also discussed proceeding with left arm fistula vs. graft.  The decision will have to be made tomorrow morning or in the operating room if vein mapping has not yet been performed.  I discussed all the potential procedures including a staged basilic vein operation, a radiocephalic vs. brachiocephalic fistula, vs. a dialysis graft.  We discussed the risks and benefits including but not limited to the need for future interventions, the risk of steal syndrome, and the risk of non-maturity.  She'll be n.p.o. after midnight.  L. remove the IV from her left arm today.  Addendum:  After reviewing the vein map, the patient is a better candidate for a right upper arm fistula.  Her left cephalic vein is thrombosed.  Will change consent.  Right FA IV to be moved to left.   Eldridge Abrahams, M.D. [facility] Office: [phone] Pager:  [phone]

## 2014-02-24 ENCOUNTER — Encounter (HOSPITAL_COMMUNITY): Payer: Medicare Other | Admitting: Anesthesiology

## 2014-02-24 ENCOUNTER — Inpatient Hospital Stay (HOSPITAL_COMMUNITY): Payer: Medicare Other

## 2014-02-24 ENCOUNTER — Encounter (HOSPITAL_COMMUNITY): Payer: Self-pay | Admitting: Anesthesiology

## 2014-02-24 ENCOUNTER — Encounter (HOSPITAL_COMMUNITY)
Admission: EM | Disposition: A | Discharge status: Skilled Nursing Facility | Payer: Self-pay | Source: Home / Self Care | Attending: Internal Medicine

## 2014-02-24 ENCOUNTER — Inpatient Hospital Stay (HOSPITAL_COMMUNITY): Payer: Medicare Other | Admitting: Anesthesiology

## 2014-02-24 HISTORY — PX: INSERTION OF DIALYSIS CATHETER: SHX1324

## 2014-02-24 HISTORY — PX: AV FISTULA PLACEMENT: SHX1204

## 2014-02-24 LAB — PROTIME-INR
INR: 1.01 (ref 0.00–1.49)
PROTHROMBIN TIME: 13.1 s (ref 11.6–15.2)

## 2014-02-24 LAB — CBC
HCT: 28.8 % — ABNORMAL LOW (ref 36.0–46.0)
HEMOGLOBIN: 9.6 g/dL — AB (ref 12.0–15.0)
MCH: 29.5 pg (ref 26.0–34.0)
MCHC: 33.3 g/dL (ref 30.0–36.0)
MCV: 88.6 fL (ref 78.0–100.0)
Platelets: 176 10*3/uL (ref 150–400)
RBC: 3.25 MIL/uL — AB (ref 3.87–5.11)
RDW: 16.7 % — ABNORMAL HIGH (ref 11.5–15.5)
WBC: 6.1 10*3/uL (ref 4.0–10.5)

## 2014-02-24 LAB — BASIC METABOLIC PANEL
BUN: 97 mg/dL — ABNORMAL HIGH (ref 6–23)
CO2: 21 meq/L (ref 19–32)
Calcium: 8.3 mg/dL — ABNORMAL LOW (ref 8.4–10.5)
Chloride: 90 mEq/L — ABNORMAL LOW (ref 96–112)
Creatinine, Ser: 7.91 mg/dL — ABNORMAL HIGH (ref 0.50–1.10)
GFR calc Af Amer: 5 mL/min — ABNORMAL LOW (ref >90)
GFR calc non Af Amer: 4 mL/min — ABNORMAL LOW (ref >90)
GLUCOSE: 98 mg/dL (ref 70–99)
Potassium: 3.6 mEq/L — ABNORMAL LOW (ref 3.7–5.3)
Sodium: 129 mEq/L — ABNORMAL LOW (ref 137–147)

## 2014-02-24 LAB — SURGICAL PCR SCREEN
MRSA, PCR: NEGATIVE
Staphylococcus aureus: NEGATIVE

## 2014-02-24 SURGERY — INSERTION OF DIALYSIS CATHETER
Anesthesia: General | Site: Chest | Laterality: Right

## 2014-02-24 MED ORDER — FENTANYL CITRATE 0.05 MG/ML IJ SOLN
INTRAMUSCULAR | Status: DC | PRN
  Administered 2014-02-24 (×2): 25 ug via INTRAVENOUS
  Administered 2014-02-24: 50 ug via INTRAVENOUS

## 2014-02-24 MED ORDER — PHENYLEPHRINE 40 MCG/ML (10ML) SYRINGE FOR IV PUSH (FOR BLOOD PRESSURE SUPPORT)
PREFILLED_SYRINGE | INTRAVENOUS | Status: AC
  Filled 2014-02-24: qty 10

## 2014-02-24 MED ORDER — THROMBIN 20000 UNITS EX SOLR
CUTANEOUS | Status: AC
  Filled 2014-02-24: qty 20000

## 2014-02-24 MED ORDER — PROPOFOL 10 MG/ML IV BOLUS
INTRAVENOUS | Status: AC
  Filled 2014-02-24: qty 20

## 2014-02-24 MED ORDER — SODIUM CHLORIDE 0.9 % IJ SOLN
INTRAMUSCULAR | Status: AC
  Filled 2014-02-24: qty 10

## 2014-02-24 MED ORDER — LIDOCAINE-EPINEPHRINE 1 %-1:100000 IJ SOLN
INTRAMUSCULAR | Status: AC
  Filled 2014-02-24: qty 2

## 2014-02-24 MED ORDER — HEPARIN SODIUM (PORCINE) 1000 UNIT/ML IJ SOLN
INTRAMUSCULAR | Status: DC | PRN
  Administered 2014-02-24: 1000 [IU] via INTRAVENOUS

## 2014-02-24 MED ORDER — FENTANYL CITRATE 0.05 MG/ML IJ SOLN
INTRAMUSCULAR | Status: AC
  Filled 2014-02-24: qty 5

## 2014-02-24 MED ORDER — HYDROMORPHONE HCL PF 1 MG/ML IJ SOLN: 0.2500 mg | INTRAMUSCULAR | Status: DC | PRN

## 2014-02-24 MED ORDER — PROPOFOL 10 MG/ML IV BOLUS
INTRAVENOUS | Status: DC | PRN
  Administered 2014-02-24: 80 mg via INTRAVENOUS

## 2014-02-24 MED ORDER — ONDANSETRON HCL 4 MG/2ML IJ SOLN
INTRAMUSCULAR | Status: DC | PRN
  Administered 2014-02-24: 4 mg via INTRAVENOUS

## 2014-02-24 MED ORDER — ACETAMINOPHEN-CODEINE #3 300-30 MG PO TABS
1.0000 | ORAL_TABLET | ORAL | Status: DC | PRN
  Administered 2014-02-28 – 2014-03-04 (×2): 1 via ORAL
  Filled 2014-02-24 (×3): qty 1

## 2014-02-24 MED ORDER — ONDANSETRON HCL 4 MG/2ML IJ SOLN: 4.0000 mg | Freq: Once | INTRAMUSCULAR | Status: DC | PRN

## 2014-02-24 MED ORDER — ALTEPLASE 100 MG IV SOLR
4.0000 mg | Freq: Once | INTRAVENOUS | Status: AC
  Administered 2014-02-24: 4 mg
  Filled 2014-02-24: qty 4

## 2014-02-24 MED ORDER — EPHEDRINE SULFATE 50 MG/ML IJ SOLN
INTRAMUSCULAR | Status: AC
  Filled 2014-02-24: qty 1

## 2014-02-24 MED ORDER — LIDOCAINE HCL (CARDIAC) 20 MG/ML IV SOLN
INTRAVENOUS | Status: AC
  Filled 2014-02-24: qty 5

## 2014-02-24 MED ORDER — PHENYLEPHRINE HCL 10 MG/ML IJ SOLN
INTRAMUSCULAR | Status: DC | PRN
  Administered 2014-02-24: 40 ug via INTRAVENOUS
  Administered 2014-02-24: 80 ug via INTRAVENOUS

## 2014-02-24 MED ORDER — SUCCINYLCHOLINE CHLORIDE 20 MG/ML IJ SOLN
INTRAMUSCULAR | Status: AC
  Filled 2014-02-24: qty 1

## 2014-02-24 MED ORDER — LIDOCAINE HCL (CARDIAC) 20 MG/ML IV SOLN
INTRAVENOUS | Status: DC | PRN
  Administered 2014-02-24: 40 mg via INTRAVENOUS

## 2014-02-24 MED ORDER — 0.9 % SODIUM CHLORIDE (POUR BTL) OPTIME
TOPICAL | Status: DC | PRN
  Administered 2014-02-24: 1000 mL

## 2014-02-24 MED ORDER — HEPARIN SODIUM (PORCINE) 1000 UNIT/ML IJ SOLN
INTRAMUSCULAR | Status: AC
  Filled 2014-02-24: qty 1

## 2014-02-24 MED ORDER — SODIUM CHLORIDE 0.9 % IR SOLN
Status: DC | PRN
  Administered 2014-02-24: 08:00:00

## 2014-02-24 MED ORDER — EPHEDRINE SULFATE 50 MG/ML IJ SOLN
INTRAMUSCULAR | Status: DC | PRN
  Administered 2014-02-24: 5 mg via INTRAVENOUS

## 2014-02-24 MED ORDER — ROCURONIUM BROMIDE 50 MG/5ML IV SOLN
INTRAVENOUS | Status: AC
  Filled 2014-02-24: qty 1

## 2014-02-24 SURGICAL SUPPLY — 65 items
ADH SKN CLS APL DERMABOND .7 (GAUZE/BANDAGES/DRESSINGS) ×2
ARMBAND PINK RESTRICT EXTREMIT (MISCELLANEOUS) ×4 IMPLANT
BAG DECANTER FOR FLEXI CONT (MISCELLANEOUS) ×4 IMPLANT
CANISTER SUCTION 2500CC (MISCELLANEOUS) ×4 IMPLANT
CATH CANNON HEMO 15F 50CM (CATHETERS) IMPLANT
CATH CANNON HEMO 15FR 19 (HEMODIALYSIS SUPPLIES) IMPLANT
CATH CANNON HEMO 15FR 23CM (HEMODIALYSIS SUPPLIES) ×2 IMPLANT
CATH CANNON HEMO 15FR 31CM (HEMODIALYSIS SUPPLIES) IMPLANT
CATH CANNON HEMO 15FR 32 (HEMODIALYSIS SUPPLIES) IMPLANT
CATH CANNON HEMO 15FR 32CM (HEMODIALYSIS SUPPLIES) IMPLANT
CATH STRAIGHT 5FR 65CM (CATHETERS) IMPLANT
CLIP TI MEDIUM 6 (CLIP) ×4 IMPLANT
CLIP TI WIDE RED SMALL 6 (CLIP) ×4 IMPLANT
COVER PROBE W GEL 5X96 (DRAPES) ×6 IMPLANT
COVER SURGICAL LIGHT HANDLE (MISCELLANEOUS) ×4 IMPLANT
DECANTER SPIKE VIAL GLASS SM (MISCELLANEOUS) ×4 IMPLANT
DERMABOND ADVANCED (GAUZE/BANDAGES/DRESSINGS) ×2
DERMABOND ADVANCED .7 DNX12 (GAUZE/BANDAGES/DRESSINGS) ×2 IMPLANT
DRAPE C-ARM 42X72 X-RAY (DRAPES) ×4 IMPLANT
DRAPE CHEST BREAST 15X10 FENES (DRAPES) ×4 IMPLANT
ELECT REM PT RETURN 9FT ADLT (ELECTROSURGICAL) ×4
ELECTRODE REM PT RTRN 9FT ADLT (ELECTROSURGICAL) ×2 IMPLANT
GAUZE SPONGE 2X2 8PLY STRL LF (GAUZE/BANDAGES/DRESSINGS) ×2 IMPLANT
GAUZE SPONGE 4X4 16PLY XRAY LF (GAUZE/BANDAGES/DRESSINGS) ×4 IMPLANT
GLOVE BIO SURGEON STRL SZ7 (GLOVE) ×8 IMPLANT
GLOVE BIO SURGEON STRL SZ8 (GLOVE) ×2 IMPLANT
GLOVE BIOGEL PI IND STRL 7.0 (GLOVE) IMPLANT
GLOVE BIOGEL PI IND STRL 7.5 (GLOVE) ×2 IMPLANT
GLOVE BIOGEL PI INDICATOR 7.0 (GLOVE) ×2
GLOVE BIOGEL PI INDICATOR 7.5 (GLOVE) ×4
GOWN STRL REUS W/ TWL LRG LVL3 (GOWN DISPOSABLE) ×6 IMPLANT
GOWN STRL REUS W/TWL LRG LVL3 (GOWN DISPOSABLE) ×12
KIT BASIN OR (CUSTOM PROCEDURE TRAY) ×4 IMPLANT
KIT ROOM TURNOVER OR (KITS) ×4 IMPLANT
NDL 18GX1X1/2 (RX/OR ONLY) (NEEDLE) ×2 IMPLANT
NDL HYPO 25GX1X1/2 BEV (NEEDLE) ×2 IMPLANT
NEEDLE 18GX1X1/2 (RX/OR ONLY) (NEEDLE) ×4 IMPLANT
NEEDLE HYPO 25GX1X1/2 BEV (NEEDLE) ×4 IMPLANT
NS IRRIG 1000ML POUR BTL (IV SOLUTION) ×4 IMPLANT
PACK CV ACCESS (CUSTOM PROCEDURE TRAY) ×4 IMPLANT
PACK SURGICAL SETUP 50X90 (CUSTOM PROCEDURE TRAY) ×4 IMPLANT
PAD ARMBOARD 7.5X6 YLW CONV (MISCELLANEOUS) ×8 IMPLANT
SET MICROPUNCTURE 5F STIFF (MISCELLANEOUS) IMPLANT
SOAP 2 % CHG 4 OZ (WOUND CARE) ×4 IMPLANT
SPONGE GAUZE 2X2 STER 10/PKG (GAUZE/BANDAGES/DRESSINGS) ×2
SPONGE SURGIFOAM ABS GEL 100 (HEMOSTASIS) IMPLANT
SUT ETHILON 3 0 PS 1 (SUTURE) ×8 IMPLANT
SUT MNCRL AB 4-0 PS2 18 (SUTURE) ×6 IMPLANT
SUT PROLENE 6 0 BV (SUTURE) ×2 IMPLANT
SUT PROLENE 7 0 BV 1 (SUTURE) ×4 IMPLANT
SUT SILK 4 0 TIE 10X30 (SUTURE) ×2 IMPLANT
SUT VIC AB 3-0 SH 27 (SUTURE) ×4
SUT VIC AB 3-0 SH 27X BRD (SUTURE) ×2 IMPLANT
SYR 20CC LL (SYRINGE) ×10 IMPLANT
SYR 30ML LL (SYRINGE) IMPLANT
SYR 3ML LL SCALE MARK (SYRINGE) ×4 IMPLANT
SYR 5ML LL (SYRINGE) ×4 IMPLANT
SYR CONTROL 10ML LL (SYRINGE) ×4 IMPLANT
SYRINGE 10CC LL (SYRINGE) ×6 IMPLANT
TAPE CLOTH SOFT 2X10 (GAUZE/BANDAGES/DRESSINGS) ×2 IMPLANT
TOWEL OR 17X24 6PK STRL BLUE (TOWEL DISPOSABLE) ×6 IMPLANT
TOWEL OR 17X26 10 PK STRL BLUE (TOWEL DISPOSABLE) ×4 IMPLANT
UNDERPAD 30X30 INCONTINENT (UNDERPADS AND DIAPERS) ×4 IMPLANT
WATER STERILE IRR 1000ML POUR (IV SOLUTION) ×4 IMPLANT
WIRE AMPLATZ SS-J .035X180CM (WIRE) IMPLANT

## 2014-02-24 NOTE — Progress Notes (Signed)
PT Cancellation Note  Patient Details Name: [name] MRN: [id number] DOB: March 22, 1926   Cancelled Treatment:    Reason Eval/Treat Not Completed: Patient at procedure or test/unavailable.  Attempted to see patient x2 today - in OR and then in HD.  Will return tomorrow for PT.   Despina Pole 02/24/2014, 2:14 PM Carita Pian. Sanjuana Kava, [place] Pager [phone]

## 2014-02-24 NOTE — Progress Notes (Signed)
Pt back from dialysis.  VSS. Pt co neck pain due to cath placement for dialysis 5/10. Paged Dr. Coralyn Pear for diet order.

## 2014-02-24 NOTE — Progress Notes (Signed)
3 rings removed from patient. One silver with round stone, one silver ring tiny band type with multiple tiny stones and one gold ring with green and clear stones. All rings placed in plastic bag with patients name and taken by Fenton Foy RN to patients son.

## 2014-02-24 NOTE — H&P (View-Only) (Signed)
Consult Note  Patient name: [name] MRN: [id number] DOB: Jun 09, 1926 Sex: female  Consulting Physician:  Nephrology  Reason for Consult:  Chief Complaint  Patient presents with  . Abnormal Lab    HISTORY OF PRESENT ILLNESS: This is a very pleasant 80 year old female who is right-handed, that I have been asked to evaluate for permanent access.  The plan is to initiate dialysis tomorrow.  Vein mapping has yet to be performed.  The patient was admitted for worsening renal function.  In the emergency department her hemoglobin was 6.2 and she was guaiac stool positive.  At that time, her INR was 5.9.  She is on anticoagulation for paroxysmal atrial fibrillation.  She is on a statin for hypercholesterolemia.  She has a history of smoking but quit 40 years ago.  Past Medical History  Diagnosis Date  . Dementia   . Hypertension   . Ulcer     Hx of  . Stomach cancer   . Snoring   . Fracture, intertrochanteric, left femur 03/25/2012  . Fracture, intertrochanteric, right femur 06/2008    s/p closed reduction and internal fixation  . Nodule of left lung     followed by Dr. Elsworth Soho // LLL spiculated nodule noted per CT in 2012 1.5x1.6x2.4, PET in 06/03/2011 was low uptake.  . Paroxysmal atrial fibrillation   . Tachycardia-bradycardia     Past Surgical History  Procedure Laterality Date  . Stomach surgery      3/4 of stomach removed due to stomach cancer  . Hip surgery  2009    right  . Total hip arthroplasty  03/29/2012    Procedure: TOTAL HIP ARTHROPLASTY;  Surgeon: Johnny Bridge, MD;  Location: [place];  Service: Orthopedics;  Laterality: Left;  Left Hip Hemiarthroplasty  . Esophagogastroduodenoscopy N/A 02/20/2014    Procedure: ESOPHAGOGASTRODUODENOSCOPY (EGD);  Surgeon: Arta Silence, MD;  Location: [facility] ENDOSCOPY;  Service: Endoscopy;  Laterality: N/A;    History   Social History  . Marital Status: Married    Spouse Name: N/A    Number of Children: 2  . Years of  Education: undergrad   Occupational History  . retired    Social History Main Topics  . Smoking status: Former Smoker -- 0.30 packs/day for 2 years    Types: Cigarettes    Quit date: 12/12/1973  . Smokeless tobacco: Never Used  . Alcohol Use: Yes     Comment: rare  . Drug Use: Not on file  . Sexual Activity: Not on file   Other Topics Concern  . Not on file   Social History Narrative   Patient is right handed and resides with son    Family History  Problem Relation Age of Onset  . Cancer Father   . Liver cancer Son   . Allergies      Allergies as of 02/18/2014  . (No Known Allergies)    No current facility-administered medications on file prior to encounter.   Current Outpatient Prescriptions on File Prior to Encounter  Medication Sig Dispense Refill  . atorvastatin (LIPITOR) 40 MG tablet Take 40 mg by mouth daily at 6 PM.       . Cyanocobalamin (VITAMIN B 12 PO) Take 1 capsule by mouth 3 (three) times a week.       . IRON PO Take 1 capsule twice daily (ferrous)      . levothyroxine (SYNTHROID) 25 MCG tablet Take 1 tablet (25 mcg total)  by mouth daily before breakfast.  30 tablet  3  . Multiple Vitamin (MULTIVITAMIN) capsule Take 1 capsule by mouth daily.         REVIEW OF SYSTEMS: Please see history of present illness, no significant changes  PHYSICAL EXAMINATION: General: The patient appears their stated age.  Vital signs are BP 122/44  Pulse 64  Temp(Src) 97.3 F (36.3 C) (Oral)  Resp 16  Ht 5\' 4"  (1.626 m)  Wt 110 lb 10.7 oz (50.2 kg)  BMI 18.99 kg/m2  SpO2 94% Pulmonary: Respirations are non-labored HEENT:  No gross abnormalities Abdomen: Soft and non-tender  Musculoskeletal: There are no major deformities.   Neurologic: No focal weakness or paresthesias are detected, Skin: There are no ulcer or rashes noted. Psychiatric: The patient has normal affect. Cardiovascular: There is a regular rate and rhythm without significant murmur appreciated.   Palpable left radial pulse.  There is an IV in her left upper arm.  Diagnostic Studies: Vein mapping is pending   Assessment:  Stage 5 renal disease Plan: I had a lengthy conversation with the patient and her son.  She will be scheduled for a tunneled dialysis catheter tomorrow morning.  I also discussed proceeding with left arm fistula vs. graft.  The decision will have to be made tomorrow morning or in the operating room if vein mapping has not yet been performed.  I discussed all the potential procedures including a staged basilic vein operation, a radiocephalic vs. brachiocephalic fistula, vs. a dialysis graft.  We discussed the risks and benefits including but not limited to the need for future interventions, the risk of steal syndrome, and the risk of non-maturity.  She'll be n.p.o. after midnight.  L. remove the IV from her left arm today.  Addendum:  After reviewing the vein map, the patient is a better candidate for a right upper arm fistula.  Her left cephalic vein is thrombosed.  Will change consent.  Right FA IV to be moved to left.   Eldridge Abrahams, M.D. [facility] [place] Office: [phone] Pager:  [phone]

## 2014-02-24 NOTE — Progress Notes (Signed)
Hemodialysis- Cath not functioning after 1.25 hours of treatment. VP 420 and port will barely flush. TPA ordered. Per Dr. Posey Pronto ok to TPA overnight and bring back tomorrow am.

## 2014-02-24 NOTE — Progress Notes (Signed)
Utilization review completed.  

## 2014-02-24 NOTE — Anesthesia Preprocedure Evaluation (Signed)
Anesthesia Evaluation  Patient identified by MRN, date of birth, ID band Patient awake    Reviewed: Allergy & Precautions, H&P , NPO status , Patient's Chart, lab work & pertinent test results  Airway       Dental   Pulmonary former smoker,          Cardiovascular hypertension, + Peripheral Vascular Disease + dysrhythmias Atrial Fibrillation     Neuro/Psych CVA    GI/Hepatic   Endo/Other  Hypothyroidism   Renal/GU ESRF, CRF and DialysisRenal disease     Musculoskeletal   Abdominal   Peds  Hematology  (+) anemia ,   Anesthesia Other Findings   Reproductive/Obstetrics                           Anesthesia Physical Anesthesia Plan  ASA: III  Anesthesia Plan: MAC and General   Post-op Pain Management:    Induction: Intravenous  Airway Management Planned: LMA  Additional Equipment:   Intra-op Plan:   Post-operative Plan: Extubation in OR  Informed Consent:   Plan Discussed with:   Anesthesia Plan Comments:         Anesthesia Quick Evaluation

## 2014-02-24 NOTE — Anesthesia Postprocedure Evaluation (Signed)
  Anesthesia Post-op Note  Patient: [name]  Procedure(s) Performed: Procedure(s): INSERTION OF DIALYSIS CATHETER (Right) ARTERIOVENOUS (AV) FISTULA CREATION (Right)  Patient Location: PACU  Anesthesia Type:General  Level of Consciousness: awake, oriented, sedated and patient cooperative  Airway and Oxygen Therapy: Patient Spontanous Breathing  Post-op Pain: mild  Post-op Assessment: Post-op Vital signs reviewed, Patient's Cardiovascular Status Stable, Respiratory Function Stable, Patent Airway, No signs of Nausea or vomiting and Pain level controlled  Post-op Vital Signs: stable  Complications: No apparent anesthesia complications

## 2014-02-24 NOTE — Progress Notes (Signed)
Dr. Bridgett Larsson notified about post-op CXR result

## 2014-02-24 NOTE — Progress Notes (Signed)
Dr. Coralyn Pear called back, diabetic renal diet.

## 2014-02-24 NOTE — Progress Notes (Signed)
[facility] Progress Note [place] TEAM 1 - Stepdown/ICU TEAM   [name] QQP:[id number] DOB: 1926/01/17 DOA: 02/18/2014 PCP: Robyn Haber, MD  Brief narrative: [name] is a 80 y.o. female presenting on 02/18/2014 with  has a past medical history of Dementia; Hypertension; Ulcer; Stomach cancer;  Fracture, intertrochanteric, left femur (03/25/2012); Fracture, intertrochanteric, right femur (06/2008); Nodule of left lung; Paroxysmal atrial fibrillation; and Tachycardia-bradycardia who presents with a Hgb of 6.2 and INR of 5.9. Hemoccult positive.    Subjective: Pt alert. No complaints  Assessment/Plan: Principal Problem:   Upper GI bleed? - Stable, no clinical evidence of GI bleed - Patient undergoing EGD which don't reveal an obvious source of bleed. GI recommended holding anticoagulation for several weeks otherwise no further GI workup plan.  Active Problems:    PAF- now NSR - stable -Anticoagulation stopped given possible GI bleed    Stage IV chronic kidney disease.  -Nephrology consulted -On 02/24/2014 patient undergoing right brachiocephalic fistula as well as right IJ dialysis catheter placement.  Patient to initiate hemodialysis today.   HTN - Norvasc     Anemia - Hgb 8.8 after 2 U PRBC    UTI - e coli -Rocephin discontinued.   Dementia w/o behavioral disturbances    Code Status: Full code Family Communication: none Disposition Plan: I believe patient will likely require SNF placement, social work consult.  Consultants: nephrology GI  Procedures: none  Antibiotics:    DVT prophylaxis: SCDs  Objective: Filed Weights   02/23/14 0232 02/23/14 2115 02/24/14 1210  Weight: 50.2 kg (110 lb 10.7 oz) 50.1 kg (110 lb 7.2 oz) 50.8 kg (111 lb 15.9 oz)   Blood pressure 107/54, pulse 65, temperature 97.4 F (36.3 C), temperature source Oral, resp. rate 14, height 5\' 4"  (1.626 m), weight 50.8 kg (111 lb 15.9 oz), SpO2  100.00%.  Intake/Output Summary (Last 24 hours) at 02/24/14 1651 Last data filed at 02/24/14 1442  Gross per 24 hour  Intake    760 ml  Output   1918 ml  Net  -1158 ml     Exam: General: No acute respiratory distress Lungs: Clear to auscultation bilaterally without wheezes or crackles Cardiovascular: Regular rate and rhythm without murmur gallop or rub normal S1 and S2 Abdomen: Nontender, nondistended, soft, bowel sounds positive, no rebound, no ascites, no appreciable mass Extremities: No significant cyanosis, clubbing- +  edema bilateral medial ankles  Data Reviewed: Basic Metabolic Panel:  Recent Labs Lab 02/19/14 0521 02/20/14 0220 02/21/14 0625 02/22/14 0617 02/23/14 0626 02/24/14 1200  NA 137 132* 132* 131* 130* 129*  K 3.4* 3.7 4.1 3.6* 3.6* 3.6*  CL 92* 89* 94* 94* 91* 90*  CO2 21 20 19 20 20 21   GLUCOSE 67* 52* 90 90 96 98  BUN 118* 107* 95* 89* 97* 97*  CREATININE 7.76* 7.54* 7.45* 7.71* 7.75* 7.91*  CALCIUM 6.9* 7.4* 7.9* 8.1* 8.0* 8.3*  PHOS  --  7.3* 6.1* 5.4*  --   --    Liver Function Tests:  Recent Labs Lab 02/17/14 1718 02/19/14 0521 02/20/14 0220 02/21/14 0625 02/22/14 0617  AST 58* 48*  --   --   --   ALT 42* 32  --   --   --   ALKPHOS 105 94  --   --   --   BILITOT 0.8 0.7  --   --   --   PROT 5.7* 5.2*  --   --   --   ALBUMIN  2.3* 2.1* 1.9* 1.7* 1.6*   No results found for this basename: LIPASE, AMYLASE,  in the last 168 hours No results found for this basename: AMMONIA,  in the last 168 hours CBC:  Recent Labs Lab 02/18/14 1205 02/18/14 1830 02/19/14 0521 02/20/14 0220 02/23/14 0626 02/24/14 1200  WBC 8.6  --   --  7.5 6.8 6.1  NEUTROABS 7.5  --   --   --   --   --   HGB 6.2* 8.2* 8.8* 8.9* 9.4* 9.6*  HCT 18.4* 23.9* 25.5* 25.8* 27.5* 28.8*  MCV 92.0  --   --  86.0 87.9 88.6  PLT 239  --   --  192 156 176   Cardiac Enzymes: No results found for this basename: CKTOTAL, CKMB, CKMBINDEX, TROPONINI,  in the last 168  hours BNP (last 3 results) No results found for this basename: PROBNP,  in the last 8760 hours CBG: No results found for this basename: GLUCAP,  in the last 168 hours  Recent Results (from the past 240 hour(s))  URINE CULTURE     Status: None   Collection Time    02/18/14 12:59 PM      Result Value Ref Range Status   Specimen Description URINE, CATHETERIZED   Final   Special Requests NONE   Final   Culture  Setup Time     Final   Value: 02/19/2014 08:38     Performed at [facility]     Final   Value: >=100,000 COLONIES/ML     Performed at [facility]   Culture     Final   Value: ESCHERICHIA COLI     Performed at [facility]   Report Status 02/20/2014 FINAL   Final   Organism ID, Bacteria ESCHERICHIA COLI   Final  MRSA PCR SCREENING     Status: None   Collection Time    02/18/14  6:14 PM      Result Value Ref Range Status   MRSA by PCR NEGATIVE  NEGATIVE Final   Comment:            The GeneXpert MRSA Assay (FDA     approved for NASAL specimens     only), is one component of a     comprehensive MRSA colonization     surveillance program. It is not     intended to diagnose MRSA     infection nor to guide or     monitor treatment for     MRSA infections.  SURGICAL PCR SCREEN     Status: None   Collection Time    02/23/14 11:26 PM      Result Value Ref Range Status   MRSA, PCR NEGATIVE  NEGATIVE Final   Staphylococcus aureus NEGATIVE  NEGATIVE Final   Comment:            The Xpert SA Assay (FDA     approved for NASAL specimens     in patients over 73 years of age),     is one component of     a comprehensive surveillance     program.  Test performance has     been validated by [employer] for patients greater     than or equal to 82 year old.     It is not intended     to diagnose infection nor to     guide or monitor treatment.  Studies:  Recent x-ray studies have been reviewed in detail by the Attending  Physician  Scheduled Meds:  Scheduled Meds: . amiodarone  100 mg Oral Daily  . amLODipine  5 mg Oral Daily  . calcium acetate  1,334 mg Oral TID WC  . [START ON 02/26/2014] darbepoetin (ARANESP) injection - DIALYSIS  100 mcg Intravenous Q Wed-HD  . doxercalciferol  2 mcg Intravenous Q M,W,F-HD  . feeding supplement (NEPRO CARB STEADY)  237 mL Oral BID BM  . levothyroxine  25 mcg Oral QAC breakfast  . multivitamin  1 tablet Oral QHS  . pantoprazole  20 mg Oral Daily   Continuous Infusions: . sodium chloride 50 mL/hr at 02/23/14 3779    Time spent on care of this patient: 35 min   Kelvin Cellar, MD  [facility] Office  [phone] Pager - Text Page per Shea Evans as per below:  On-Call/Text Page:      [email or web address]  If 7PM-7AM, please contact night-coverage [email or web address] 02/24/2014, 4:51 PM   LOS: 6 days

## 2014-02-24 NOTE — Progress Notes (Signed)
Patient ID: [name], female   DOB: [birth year], 80 y.o.   MRN: [id number]   [facility] Progress Note    Assessment/ Plan:   1. Acute on CKD 4 with apparent progression to end-stage renal disease: Status post right brachiocephalic fistula and tunneled right IJ dialysis catheter placement today. Orders in place for dialysis today and will begin the process for outpatient dialysis unit placement. Dialysis was initiated due to apparent uremic symptoms affecting mentation 2. GI bleed. EGD neg. Hg stable  3. Anemia a combination of anemia of chronic kidney disease with GI bleed-improved status post packed red cell transfusion and now on Aranesp. 4. E coli UTI  5. Secondary hyperparathyroidism on calcitriol for control of PTH, borderline phosphorus levels-currently on calcium acetate 1334 mg 3 times a day a.c. 6. Hx A Fib, NSR now   Subjective:   Denies any emerging complaints-comfortable at this time status post surgery.    Objective:   BP 123/46  Pulse 60  Temp(Src) 98 F (36.7 C) (Oral)  Resp 12  Ht 5\' 4"  (1.626 m)  Wt 50.1 kg (110 lb 7.2 oz)  BMI 18.95 kg/m2  SpO2 94%  Intake/Output Summary (Last 24 hours) at 02/24/14 1142 Last data filed at 02/24/14 1100  Gross per 24 hour  Intake    880 ml  Output   1700 ml  Net   -820 ml   Weight change: -0.1 kg (-3.5 oz)  Physical Exam: Gen: Comfortably resting in bed, son by bedside CVS: Pulse regular in rate and rhythm, S1 and S2 with an ejection systolic murmur Resp: Coarse breath sounds bilaterally-no distinct rales/rhonchi Abd: Soft, flat, nontender and bowel sounds are normal Ext: Trace lower extremity edema primarily around ankles  Imaging: Dg Chest Port 1 View  02/24/2014   CLINICAL DATA:  Status post dialysis catheter placement  EXAM: PORTABLE CHEST - 1 VIEW  COMPARISON:  02/18/2014  FINDINGS: Cardiac shadow is stable. The lungs are well aerated. Small pleural effusions are noted bilaterally. A new dialysis  catheter is seen with the catheter tip at the cavoatrial junction in satisfactory position. No pneumothorax is noted. No acute bony abnormality is noted. Postsurgical changes are noted in the abdomen.  IMPRESSION: No pneumothorax is noted following catheter placement. Small pleural effusions are noted bilaterally.  These results were called by telephone at the time of interpretation on 02/24/2014 at 10:21 AM to [facility] the patient's nurse in the PACU, who verbally acknowledged these results.   Electronically Signed   By: Inez Catalina M.D.   On: 02/24/2014 10:22    Labs: BMET  Recent Labs Lab 02/18/14 1205 02/19/14 0521 02/20/14 0220 02/21/14 0625 02/22/14 0617 02/23/14 0626  NA 136* 137 132* 132* 131* 130*  K 3.8 3.4* 3.7 4.1 3.6* 3.6*  CL 88* 92* 89* 94* 94* 91*  CO2 25 21 20 19 20 20   GLUCOSE 102* 67* 52* 90 90 96  BUN 116* 118* 107* 95* 89* 97*  CREATININE 7.87* 7.76* 7.54* 7.45* 7.71* 7.75*  CALCIUM 7.0* 6.9* 7.4* 7.9* 8.1* 8.0*  PHOS  --   --  7.3* 6.1* 5.4*  --    CBC  Recent Labs Lab 02/18/14 1205 02/18/14 1830 02/19/14 0521 02/20/14 0220 02/23/14 0626  WBC 8.6  --   --  7.5 6.8  NEUTROABS 7.5  --   --   --   --   HGB 6.2* 8.2* 8.8* 8.9* 9.4*  HCT 18.4* 23.9* 25.5* 25.8* 27.5*  MCV  92.0  --   --  86.0 87.9  PLT 239  --   --  192 156    Medications:    . amiodarone  100 mg Oral Daily  . amLODipine  5 mg Oral Daily  . calcium acetate  1,334 mg Oral TID WC  . [START ON 02/26/2014] darbepoetin (ARANESP) injection - DIALYSIS  100 mcg Intravenous Q Wed-HD  . doxercalciferol  2 mcg Intravenous Q M,W,F-HD  . feeding supplement (NEPRO CARB STEADY)  237 mL Oral BID BM  . levothyroxine  25 mcg Oral QAC breakfast  . multivitamin  1 tablet Oral QHS  . pantoprazole  20 mg Oral Daily   Elmarie Shiley, MD 02/24/2014, 11:42 AM

## 2014-02-24 NOTE — Transfer of Care (Signed)
Immediate Anesthesia Transfer of Care Note  Patient: [name]  Procedure(s) Performed: Procedure(s): INSERTION OF DIALYSIS CATHETER (Right) ARTERIOVENOUS (AV) FISTULA CREATION (Right)  Patient Location: PACU  Anesthesia Type:General  Level of Consciousness: sedated, patient cooperative and responds to stimulation  Airway & Oxygen Therapy: Patient Spontanous Breathing and Patient connected to nasal cannula oxygen  Post-op Assessment: Report given to PACU RN, Post -op Vital signs reviewed and stable and Patient moving all extremities  Post vital signs: Reviewed and stable  Complications: No apparent anesthesia complications

## 2014-02-24 NOTE — Op Note (Signed)
OPERATIVE NOTE  PROCEDURE: 1. Right internal jugular vein tunneled dialysis catheter placement 2. Right internal jugular vein cannulation under ultrasound guidance 3.  Right brachiocephalic arteriovenous fistula    PRE-OPERATIVE DIAGNOSIS: end-stage renal failure  POST-OPERATIVE DIAGNOSIS: same as above  SURGEON: Adele Barthel, MD  ANESTHESIA: general  ESTIMATED BLOOD LOSS: 30 cc  FINDING(S): 1.  Tips of the catheter in the right atrium on fluoroscopy 2.  No obvious pneumothorax on fluoroscopy  SPECIMEN(S):  none  INDICATIONS:   [name] is a 80 y.o. female who presents with chronic kidney disease stage V with imminent end stage renal disease.  The patient presents for tunneled dialysis catheter placement and right arm access placement.  The patient is aware the risks of tunneled dialysis catheter placement include but are not limited to: bleeding, infection, central venous injury, pneumothorax, possible venous stenosis, possible malpositioning in the venous system, and possible infections related to long-term catheter presence.  The patient was aware of these risks and agreed to proceed.  DESCRIPTION: After written full informed consent was obtained from the patient, the patient was taken back to the operating room.  Prior to induction, the patient was given IV antibiotics.  After obtaining adequate sedation, the patient was prepped and draped in the standard fashion for a chest or neck tunneled dialysis catheter placement.  Under ultrasound guidance, the right internal jugular vein was cannulated with the 18 gauge needle.  A J-wire was then placed down in the right ventricle under fluroscopic guidance.  The wire was then secured in place with a clamp to the drapes.  I then made stab incisions at the neck and exit sites.   I dissected from the exit site to the cannulation site with a metal tunneler.   The subcutaneous tunnel was dilated by passing a plastic dilator over the metal  dissector. The wire was then unclamped and I removed the needle.  The skin tract and venotomy was dilated serially with dilators.  Finally, the dilator-sheath was placed under fluroscopic guidance into the superior vena cava.  The dilator and wire were removed.  A 23 cm Diatek catheter was placed under fluoroscopic guidance down into the right atrium.  The sheath was broken and peeled away while holding the catheter cuff at the level of the skin.  The back end of this catheter was transected, revealing the two lumens of this catheter.  The ports were docked onto these two lumens.  The catheter hub was then screwed into place.  Each port was tested by aspirating and flushing.  No resistance was noted.  Each port was then thoroughly flushed with heparinized saline.  The catheter was secured in placed with two interrupted stitches of 3-0 Nylon tied to the catheter.  The neck incision was closed with a U-stitch of 4-0 Monocryl.  The neck and chest incision were cleaned and sterile bandages applied.  Each port was then loaded with concentrated heparin (1000 Units/mL) at the manufacturer recommended volumes to each port.  Sterile caps were applied to each port.  On completion fluoroscopy, the tips of the catheter were in the right atrium, and there was no evidence of pneumothorax.  At this point, the drapes were taken down.  The patient was re-prepped and re-draped in the standard fashion for a right arm access procedure.  I turned my attention first to identifying the patient's cephalic vein and brachial artery.  Using SonoSite guidance, the location of these vessels were marked out on the  skin.   I made a transverse incision at the level of the antecubitum and dissected through the subcutaneous tissue and fascia to gain exposure of the brachial artery.  This was noted to be 4 mm in diameter externally.  This was dissected out proximally and distally and controlled with vessel loops .  I then dissected out the cephalic  vein.  This was noted to be 3 mm in diameter externally.  The distal segment of the vein was ligated with a  2-0 silk, and the vein was transected.  The proximal segment was iinterrogated with serial dilators.  The vein accepted up to a 4 mm dilator without any difficulty.  I then instilled the heparinized saline into the vein and clamped it.  At this point, I reset my exposure of the brachial artery and placed the artery under tension proximally and distally.  I made an arteriotomy with a #11 blade, and then I extended the arteriotomy with a Potts scissor.  I injected heparinized saline proximal and distal to this arteriotomy.  The vein was then sewn to the artery in an end-to-side configuration with a running stitch of 7-0 Prolene.  Prior to completing this anastomosis, I allowed the vein and artery to backbleed.  There was no evidence of clot from any vessels.  I completed the anastomosis in the usual fashion and then released all vessel loops and clamps.  There was a palpable thrill in the venous outflow, and there was a dopplerable radial signal.  At this point, I irrigated out the surgical wound.  There was no further active bleeding.  The subcutaneous tissue was reapproximated with a running stitch of 3-0 Vicryl.  The skin was then reapproximated with a running subcuticular stitch of 4-0 Vicryl.  The skin was then cleaned, dried, and reinforced with Dermabond.  The patient tolerated this procedure well.   COMPLICATIONS: none  CONDITION: stable  Adele Barthel, MD [facility] [place] Office: [phone] Pager: [phone]  02/24/2014, 8:40 AM

## 2014-02-24 NOTE — Interval H&P Note (Signed)
History and Physical Interval Note:  02/24/2014 7:19 AM  [name]  has presented today for surgery, with the diagnosis of /  The various methods of treatment have been discussed with the patient and family. After consideration of risks, benefits and other options for treatment, the patient has consented to  Procedure(s): INSERTION OF DIALYSIS CATHETER (N/A) ARTERIOVENOUS (AV) FISTULA CREATION (Right) as a surgical intervention .  The patient's history has been reviewed, patient examined, no change in status, stable for surgery.  I have reviewed the patient's chart and labs.  Questions were answered to the patient's satisfaction.     CHEN,BRIAN LIANG-YU

## 2014-02-25 ENCOUNTER — Telehealth: Payer: Self-pay | Admitting: *Deleted

## 2014-02-25 ENCOUNTER — Encounter (HOSPITAL_COMMUNITY): Payer: Self-pay | Admitting: Vascular Surgery

## 2014-02-25 LAB — BASIC METABOLIC PANEL
BUN: 79 mg/dL — ABNORMAL HIGH (ref 6–23)
CO2: 23 mEq/L (ref 19–32)
Calcium: 8 mg/dL — ABNORMAL LOW (ref 8.4–10.5)
Chloride: 93 mEq/L — ABNORMAL LOW (ref 96–112)
Creatinine, Ser: 6.92 mg/dL — ABNORMAL HIGH (ref 0.50–1.10)
GFR, EST AFRICAN AMERICAN: 6 mL/min — AB (ref >90)
GFR, EST NON AFRICAN AMERICAN: 5 mL/min — AB (ref >90)
GLUCOSE: 135 mg/dL — AB (ref 70–99)
POTASSIUM: 3.6 meq/L — AB (ref 3.7–5.3)
Sodium: 131 mEq/L — ABNORMAL LOW (ref 137–147)

## 2014-02-25 LAB — CBC
HCT: 29.2 % — ABNORMAL LOW (ref 36.0–46.0)
Hemoglobin: 9.6 g/dL — ABNORMAL LOW (ref 12.0–15.0)
MCH: 29.6 pg (ref 26.0–34.0)
MCHC: 32.9 g/dL (ref 30.0–36.0)
MCV: 90.1 fL (ref 78.0–100.0)
Platelets: 145 10*3/uL — ABNORMAL LOW (ref 150–400)
RBC: 3.24 MIL/uL — AB (ref 3.87–5.11)
RDW: 16.9 % — ABNORMAL HIGH (ref 11.5–15.5)
WBC: 7.1 10*3/uL (ref 4.0–10.5)

## 2014-02-25 MED ORDER — HEPARIN SODIUM (PORCINE) 1000 UNIT/ML DIALYSIS
3000.0000 [IU] | INTRAMUSCULAR | Status: DC | PRN
  Administered 2014-02-25: 3000 [IU] via INTRAVENOUS_CENTRAL
  Filled 2014-02-25: qty 3

## 2014-02-25 MED ORDER — HEPARIN SODIUM (PORCINE) 1000 UNIT/ML DIALYSIS
2000.0000 [IU] | INTRAMUSCULAR | Status: DC | PRN
  Filled 2014-02-25: qty 2

## 2014-02-25 MED ORDER — KIDNEY FAILURE BOOK
Freq: Once | Status: AC
  Administered 2014-02-25: 07:00:00
  Filled 2014-02-25: qty 1

## 2014-02-25 NOTE — Telephone Encounter (Signed)
Son aware of labs from 02/17/14 and the importance of getting a PCP for his mother.  She is in the hospital now

## 2014-02-25 NOTE — Progress Notes (Signed)
   Daily Progress Note  Assessment/Planning: POD #1 s/p RIJV TDC placement, R BC AVF   No signs of steal  HD via TDC this AM  Follow up in office in 6 weeks to check maturation of fistula  Subjective  - 1 Day Post-Op  No complaints  Objective Filed Vitals:   02/24/14 1335 02/24/14 1700 02/24/14 2146 02/25/14 0446  BP: 107/54 119/57 122/59 119/55  Pulse: 65 61 63 55  Temp:  97.4 F (36.3 C) 96.9 F (36.1 C) 98 F (36.7 C)  TempSrc:  Oral Axillary Axillary  Resp:  23 20 16   Height:      Weight:   118 lb 6.2 oz (53.7 kg)   SpO2: 100% 95% 96% 97%    Intake/Output Summary (Last 24 hours) at 02/25/14 0853 Last data filed at 02/25/14 0445  Gross per 24 hour  Intake    460 ml  Output   1268 ml  Net   -808 ml    PULM  CTAB, RIJ TDC in place without active bleeding VASC  R BC AVF with palpable thrill, +bruit, inc c/d/i, hand grip 5/5 in R hand, sens. Grossly intact  Laboratory CBC    Component Value Date/Time   WBC 6.1 02/24/2014 1200   WBC 6.7 09/18/2007 1403   HGB 9.6* 02/24/2014 1200   HGB 12.7 09/18/2007 1403   HCT 28.8* 02/24/2014 1200   HCT 37.1 09/18/2007 1403   PLT 176 02/24/2014 1200   PLT 310 09/18/2007 1403    BMET    Component Value Date/Time   NA 129* 02/24/2014 1200   K 3.6* 02/24/2014 1200   CL 90* 02/24/2014 1200   CO2 21 02/24/2014 1200   GLUCOSE 98 02/24/2014 1200   BUN 97* 02/24/2014 1200   CREATININE 7.91* 02/24/2014 1200   CALCIUM 8.3* 02/24/2014 1200   GFRNONAA 4* 02/24/2014 1200   GFRAA 5* 02/24/2014 [place], MD [facility]: [phone] Pager: [phone]  02/25/2014, 8:53 AM

## 2014-02-25 NOTE — Progress Notes (Signed)
Physical Therapy Treatment Patient Details Name: TYEASHA EBBS MRN: [id number] DOB: 1926-08-12 Today's Date: 02/25/2014 Time: 4098-1191 PT Time Calculation (min): 38 min  PT Assessment / Plan / Recommendation  History of Present Illness Pt with RUE catheter placement and  HD yesterday, now with edema in right forearm, upper arm and neck. Pt currently with IV in left foot   PT Comments   Pt with decreased functional mobility today with decreased posture and occasional need for 2 person assist.  She would benefit from short term SNF prior to d/c to home with son  Follow Up Recommendations  SNF     Does the patient have the potential to tolerate intense rehabilitation     Barriers to Discharge        Equipment Recommendations  None recommended by PT    Recommendations for Other Services OT consult  Frequency Min 3X/week   Progress towards PT Goals Progress towards PT goals: Not progressing toward goals - comment (pt with increased pain, edema and decreased function after procedures yesterday)  Plan Discharge plan needs to be updated    Precautions / Restrictions Precautions Precautions: Fall Restrictions Weight Bearing Restrictions: No   Pertinent Vitals/Pain Pt with c/o pain in R supraclavicular area. MD aware    Mobility  Bed Mobility Overal bed mobility: Needs Assistance Bed Mobility: Supine to Sit;Rolling Rolling: Mod assist Supine to sit: Mod assist;HOB elevated General bed mobility comments: hand over hand assit to reach for bedrail for rolling.  Pt needs physical assist to lift upper body off bed to get to sitting Transfers Overall transfer level: Needs assistance Equipment used: Rolling walker (2 wheeled) Transfers: Sit to/from [facility] Sit to Stand: Mod assist Stand pivot transfers: Mod assist;+2 physical assistance General transfer comment: pt needs assist to lift hips off bed/chair.  has difficulty picking feet up to reposition to get in front  of chair Ambulation/Gait General Gait Details: unable to progress to gait to day due to increased assist needed and precaution to protect IV site in foot    Exercises General Exercises - Upper Extremity Shoulder Flexion: AAROM;Both;10 reps;Supine;Other (comment) (RUE positioned on 2 pillows for edema management) General Exercises - Lower Extremity Short Arc Quad: AAROM;Both;10 reps;Supine Hip ABduction/ADduction: AROM;Sidelying;Right;5 reps Hip Flexion/Marching: AAROM;Both;15 reps;Supine   PT Diagnosis:    PT Problem List:   PT Treatment Interventions:     PT Goals (current goals can now be found in the care plan section)    Visit Information  Last PT Received On: 02/25/14 Assistance Needed: +2 History of Present Illness: Pt with RUE catheter placement and  HD yesterday, now with edema in right forearm, upper arm and neck. Pt currently with IV in left foot    Subjective Data      Cognition  Cognition Arousal/Alertness: Awake/alert Behavior During Therapy: WFL for tasks assessed/performed Overall Cognitive Status: History of cognitive impairments - at baseline    Balance  Balance Overall balance assessment: Needs assistance Sitting-balance support: Bilateral upper extremity supported Sitting balance-Leahy Scale: Fair Sitting balance - Comments: limited ability to use right arm to push up and weight bear.Keeps trunk bent forward Postural control: Other (comment) (unable to extend upper trunk) Standing balance support: Bilateral upper extremity supported Standing balance-Leahy Scale: Poor Standing balance comment: need extra time and assit to extend trunk, but she ablet to achieve upright posture, but can only maintain temporarily General Comments General comments (skin integrity, edema, etc.): Edema and pain in RUE as above, allevyn dressing on  sacum and multiple area of allevyn on deformed feet  End of Session PT - End of Session Activity Tolerance: Patient limited by  pain;Patient limited by fatigue Patient left: in chair;with call bell/phone within reach Nurse Communication: Mobility status   GP    Donato Heinz. [place], [place] 02/25/2014, 11:16 AM

## 2014-02-25 NOTE — Progress Notes (Addendum)
[facility] Progress Note [place] TEAM 1 - Stepdown/ICU TEAM   [name] EUM:[id number] DOB: 1926-11-25 DOA: 02/18/2014 PCP: Robyn Haber, MD  Interim summary: Patient is a pleasant 80 year old female with a past medical history of stage IV chronic kidney disease, dementia, atrial fibrillation who was on Coumadin therapy prior to this hospitalization was referred to the emergency room at [facility] by primary care provider on 02/18/2014 for abnormal blood work. Patient having a significant deterioration to kidney function with creatinine at 7. She was also found to have a hemoglobin of 6.2 with guaiac positive stools. INR was also supratherapeutic at 5.9. Patient was transfused with packed red blood cells. She had reported episodes of black tarry stools as of our GI bleed was suspected. Dr. Paulita Fujita of gastroenterology was consulted as she underwent EGD on 02/20/2014. There is no obvious source of bleed noted on procedure. GI recommending holding off on anticoagulation for several weeks. Nephrology was consulted regarding acute on chronic renal failure. Lab work did not show improvement to kidney function as nephrology recommended initiating hemodialysis. She had a right brachiocephalic fistula placed along with IJ dialysis catheter on 02/24/2014, initiating hemodialysis.  Plan: Plan to continue inpatient hemodialysis, social work consult for outpatient dialysis spot as well as SNF placement when medically stable.   Subjective: Pt alert. No complaints  Assessment/Plan: Principal Problem:   Upper GI bleed? - Stable, no clinical evidence of GI bleed - Patient undergoing EGD which don't reveal an obvious source of bleed. GI recommended holding anticoagulation for several weeks otherwise no further GI workup plan.  Active Problems:    PAF- now NSR - stable, continue amiodarone therapy -Anticoagulation stopped given possible GI bleed    Stage IV chronic kidney disease.   -Nephrology consulted -On 02/24/2014 patient undergoing right brachiocephalic fistula as well as right IJ dialysis catheter placement.  -Hemodialysis was started on 02/24/2014   HTN -Blood pressures are stable, continue Norvasc 5 mg by mouth daily     Anemia - Hgb 8.8 after 2 U PRBC    UTI - e coli -Rocephin discontinued.   Dementia w/o behavioral disturbances    Code Status: Full code Family Communication: none Disposition Plan: I believe patient will likely require SNF placement, social work consult.  Consultants: nephrology GI  Procedures: none  Antibiotics:    DVT prophylaxis: SCDs  Objective: Filed Weights   02/23/14 2115 02/24/14 1210 02/24/14 2146  Weight: 50.1 kg (110 lb 7.2 oz) 50.8 kg (111 lb 15.9 oz) 53.7 kg (118 lb 6.2 oz)   Blood pressure 118/56, pulse 58, temperature 97.9 F (36.6 C), temperature source Oral, resp. rate 17, height 5\' 4"  (1.626 m), weight 53.7 kg (118 lb 6.2 oz), SpO2 96.00%.  Intake/Output Summary (Last 24 hours) at 02/25/14 1452 Last data filed at 02/25/14 0900  Gross per 24 hour  Intake    120 ml  Output    700 ml  Net   -580 ml     Exam: General: No acute respiratory distress Lungs: Clear to auscultation bilaterally without wheezes or crackles Cardiovascular: Regular rate and rhythm without murmur gallop or rub normal S1 and S2 Abdomen: Nontender, nondistended, soft, bowel sounds positive, no rebound, no ascites, no appreciable mass Extremities: No significant cyanosis, clubbing- +  edema bilateral medial ankles  Data Reviewed: Basic Metabolic Panel:  Recent Labs Lab 02/19/14 0521 02/20/14 0220 02/21/14 0625 02/22/14 0617 02/23/14 0626 02/24/14 1200 02/25/14 1120  NA 137 132* 132* 131* 130* 129* 131*  K 3.4* 3.7 4.1 3.6* 3.6* 3.6* 3.6*  CL 92* 89* 94* 94* 91* 90* 93*  CO2 21 20 19 20 20 21 23   GLUCOSE 67* 52* 90 90 96 98 135*  BUN 118* 107* 95* 89* 97* 97* 79*  CREATININE 7.76* 7.54* 7.45* 7.71* 7.75*  7.91* 6.92*  CALCIUM 6.9* 7.4* 7.9* 8.1* 8.0* 8.3* 8.0*  PHOS  --  7.3* 6.1* 5.4*  --   --   --    Liver Function Tests:  Recent Labs Lab 02/19/14 0521 02/20/14 0220 02/21/14 0625 02/22/14 0617  AST 48*  --   --   --   ALT 32  --   --   --   ALKPHOS 94  --   --   --   BILITOT 0.7  --   --   --   PROT 5.2*  --   --   --   ALBUMIN 2.1* 1.9* 1.7* 1.6*   No results found for this basename: LIPASE, AMYLASE,  in the last 168 hours No results found for this basename: AMMONIA,  in the last 168 hours CBC:  Recent Labs Lab 02/19/14 0521 02/20/14 0220 02/23/14 0626 02/24/14 1200 02/25/14 1120  WBC  --  7.5 6.8 6.1 7.1  HGB 8.8* 8.9* 9.4* 9.6* 9.6*  HCT 25.5* 25.8* 27.5* 28.8* 29.2*  MCV  --  86.0 87.9 88.6 90.1  PLT  --  192 156 176 145*   Cardiac Enzymes: No results found for this basename: CKTOTAL, CKMB, CKMBINDEX, TROPONINI,  in the last 168 hours BNP (last 3 results) No results found for this basename: PROBNP,  in the last 8760 hours CBG: No results found for this basename: GLUCAP,  in the last 168 hours  Recent Results (from the past 240 hour(s))  URINE CULTURE     Status: None   Collection Time    02/18/14 12:59 PM      Result Value Ref Range Status   Specimen Description URINE, CATHETERIZED   Final   Special Requests NONE   Final   Culture  Setup Time     Final   Value: 02/19/2014 08:38     Performed at [facility]     Final   Value: >=100,000 COLONIES/ML     Performed at [facility]   Culture     Final   Value: ESCHERICHIA COLI     Performed at [facility]   Report Status 02/20/2014 FINAL   Final   Organism ID, Bacteria ESCHERICHIA COLI   Final  MRSA PCR SCREENING     Status: None   Collection Time    02/18/14  6:14 PM      Result Value Ref Range Status   MRSA by PCR NEGATIVE  NEGATIVE Final   Comment:            The GeneXpert MRSA Assay (FDA     approved for NASAL specimens     only), is one component of a      comprehensive MRSA colonization     surveillance program. It is not     intended to diagnose MRSA     infection nor to guide or     monitor treatment for     MRSA infections.  SURGICAL PCR SCREEN     Status: None   Collection Time    02/23/14 11:26 PM      Result Value Ref Range Status   MRSA, PCR  NEGATIVE  NEGATIVE Final   Staphylococcus aureus NEGATIVE  NEGATIVE Final   Comment:            The Xpert SA Assay (FDA     approved for NASAL specimens     in patients over 30 years of age),     is one component of     a comprehensive surveillance     program.  Test performance has     been validated by [employer] for patients greater     than or equal to 26 year old.     It is not intended     to diagnose infection nor to     guide or monitor treatment.     Studies:  Recent x-ray studies have been reviewed in detail by the Attending Physician  Scheduled Meds:  Scheduled Meds: . amiodarone  100 mg Oral Daily  . amLODipine  5 mg Oral Daily  . calcium acetate  1,334 mg Oral TID WC  . [START ON 02/26/2014] darbepoetin (ARANESP) injection - DIALYSIS  100 mcg Intravenous Q Wed-HD  . doxercalciferol  2 mcg Intravenous Q M,W,F-HD  . feeding supplement (NEPRO CARB STEADY)  237 mL Oral BID BM  . levothyroxine  25 mcg Oral QAC breakfast  . multivitamin  1 tablet Oral QHS  . pantoprazole  20 mg Oral Daily   Continuous Infusions: . sodium chloride 50 mL/hr at 02/23/14 6503    Time spent on care of this patient: 35 min   Kelvin Cellar, MD  [facility] Office  [phone] Pager - Text Page per Shea Evans as per below:  On-Call/Text Page:      [email or web address]  If 7PM-7AM, please contact night-coverage [email or web address] 02/25/2014, 2:52 PM   LOS: 7 days

## 2014-02-25 NOTE — Progress Notes (Addendum)
Patient ID: [name], female   DOB: 1926/06/14, 80 y.o.   MRN: [id number]   [facility] Progress Note   Assessment/ Plan:   1. End-stage renal disease: Status post right brachiocephalic fistula and tunneled right IJ dialysis catheter placement yesterday. She had dialysis done yesterday that she tolerated without problems-had to be cut short due to problems with the venous port of her dialysis catheter. The catheter was locked with TPA and plans in place for repeat dialysis today. The process has been started for outpatient dialysis unit placement and it appears that she will likely need to be discharged to a skilled nursing facility.  2. GI bleed. EGD neg. Hg stable . Continue Aranesp 3. Anemia a combination of anemia of chronic kidney disease with GI bleed-improved status post packed red cell transfusion and now on Aranesp.  4. E coli UTI : Status post treatment. 5. Secondary hyperparathyroidism on calcitriol for control of PTH, borderline phosphorus levels-currently on calcium acetate 1334 mg 3 times a day a.c.  6. Hx A Fib, NSR now   Subjective:   Reports to be feeling better-working with physical therapy    Objective:   BP 118/56  Pulse 58  Temp(Src) 97.9 F (36.6 C) (Oral)  Resp 17  Ht 5\' 4"  (1.626 m)  Wt 53.7 kg (118 lb 6.2 oz)  BMI 20.31 kg/m2  SpO2 96%  Physical Exam: Gen: Comfortably resting in bed-PT by bedside CVS: Pulse regular bradycardia, S1 and S2 normal Resp: Coarse breath sounds bilaterally-no distinct rales or rhonchi Abd: Soft, flat, nontender Ext: Trace lower extremity edema-peripheral IV in left ankle and left heel in protective dressing  Labs: BMET  Recent Labs Lab 02/18/14 1205 02/19/14 0521 02/20/14 0220 02/21/14 0625 02/22/14 0617 02/23/14 0626 02/24/14 1200  NA 136* 137 132* 132* 131* 130* 129*  K 3.8 3.4* 3.7 4.1 3.6* 3.6* 3.6*  CL 88* 92* 89* 94* 94* 91* 90*  CO2 25 21 20 19 20 20 21   GLUCOSE 102* 67* 52* 90 90 96 98   BUN 116* 118* 107* 95* 89* 97* 97*  CREATININE 7.87* 7.76* 7.54* 7.45* 7.71* 7.75* 7.91*  CALCIUM 7.0* 6.9* 7.4* 7.9* 8.1* 8.0* 8.3*  PHOS  --   --  7.3* 6.1* 5.4*  --   --    CBC  Recent Labs Lab 02/18/14 1205  02/19/14 0521 02/20/14 0220 02/23/14 0626 02/24/14 1200  WBC 8.6  --   --  7.5 6.8 6.1  NEUTROABS 7.5  --   --   --   --   --   HGB 6.2*  < > 8.8* 8.9* 9.4* 9.6*  HCT 18.4*  < > 25.5* 25.8* 27.5* 28.8*  MCV 92.0  --   --  86.0 87.9 88.6  PLT 239  --   --  192 156 176  < > = values in this interval not displayed.  Medications:    . amiodarone  100 mg Oral Daily  . amLODipine  5 mg Oral Daily  . calcium acetate  1,334 mg Oral TID WC  . [START ON 02/26/2014] darbepoetin (ARANESP) injection - DIALYSIS  100 mcg Intravenous Q Wed-HD  . doxercalciferol  2 mcg Intravenous Q M,W,F-HD  . feeding supplement (NEPRO CARB STEADY)  237 mL Oral BID BM  . levothyroxine  25 mcg Oral QAC breakfast  . multivitamin  1 tablet Oral QHS  . pantoprazole  20 mg Oral Daily   Elmarie Shiley, MD 02/25/2014, 10:43 AM

## 2014-02-26 ENCOUNTER — Telehealth: Payer: Self-pay | Admitting: Pulmonary Disease

## 2014-02-26 DIAGNOSIS — N179 Acute kidney failure, unspecified: Secondary | ICD-10-CM

## 2014-02-26 DIAGNOSIS — R5381 Other malaise: Secondary | ICD-10-CM

## 2014-02-26 LAB — PHOSPHORUS: PHOSPHORUS: 3.2 mg/dL (ref 2.3–4.6)

## 2014-02-26 LAB — BASIC METABOLIC PANEL
BUN: 44 mg/dL — ABNORMAL HIGH (ref 6–23)
CHLORIDE: 98 meq/L (ref 96–112)
CO2: 24 mEq/L (ref 19–32)
Calcium: 7.9 mg/dL — ABNORMAL LOW (ref 8.4–10.5)
Creatinine, Ser: 4.69 mg/dL — ABNORMAL HIGH (ref 0.50–1.10)
GFR calc non Af Amer: 8 mL/min — ABNORMAL LOW (ref >90)
GFR, EST AFRICAN AMERICAN: 9 mL/min — AB (ref >90)
Glucose, Bld: 91 mg/dL (ref 70–99)
POTASSIUM: 3.9 meq/L (ref 3.7–5.3)
Sodium: 135 mEq/L — ABNORMAL LOW (ref 137–147)

## 2014-02-26 LAB — RENAL FUNCTION PANEL
Albumin: 1.5 g/dL — ABNORMAL LOW (ref 3.5–5.2)
BUN: 45 mg/dL — AB (ref 6–23)
CALCIUM: 7.8 mg/dL — AB (ref 8.4–10.5)
CO2: 25 meq/L (ref 19–32)
Chloride: 96 mEq/L (ref 96–112)
Creatinine, Ser: 4.81 mg/dL — ABNORMAL HIGH (ref 0.50–1.10)
GFR calc Af Amer: 9 mL/min — ABNORMAL LOW (ref >90)
GFR calc non Af Amer: 7 mL/min — ABNORMAL LOW (ref >90)
GLUCOSE: 227 mg/dL — AB (ref 70–99)
Phosphorus: 3.2 mg/dL (ref 2.3–4.6)
Potassium: 3.9 mEq/L (ref 3.7–5.3)
Sodium: 134 mEq/L — ABNORMAL LOW (ref 137–147)

## 2014-02-26 LAB — CBC
HEMATOCRIT: 29.7 % — AB (ref 36.0–46.0)
HEMATOCRIT: 29.9 % — AB (ref 36.0–46.0)
HEMOGLOBIN: 9.7 g/dL — AB (ref 12.0–15.0)
Hemoglobin: 9.8 g/dL — ABNORMAL LOW (ref 12.0–15.0)
MCH: 29.7 pg (ref 26.0–34.0)
MCH: 30.2 pg (ref 26.0–34.0)
MCHC: 32.7 g/dL (ref 30.0–36.0)
MCHC: 32.8 g/dL (ref 30.0–36.0)
MCV: 90.8 fL (ref 78.0–100.0)
MCV: 92 fL (ref 78.0–100.0)
Platelets: 118 10*3/uL — ABNORMAL LOW (ref 150–400)
Platelets: 123 10*3/uL — ABNORMAL LOW (ref 150–400)
RBC: 3.27 MIL/uL — ABNORMAL LOW (ref 3.87–5.11)
RBC: 3.25 MIL/uL — AB (ref 3.87–5.11)
RDW: 17.5 % — ABNORMAL HIGH (ref 11.5–15.5)
RDW: 17.6 % — ABNORMAL HIGH (ref 11.5–15.5)
WBC: 7.3 10*3/uL (ref 4.0–10.5)
WBC: 8.3 10*3/uL (ref 4.0–10.5)

## 2014-02-26 LAB — HEPATITIS B CORE ANTIBODY, TOTAL: HEP B C TOTAL AB: NONREACTIVE

## 2014-02-26 LAB — HEPATITIS B SURFACE ANTIBODY,QUALITATIVE: HEP B S AB: NEGATIVE

## 2014-02-26 MED ORDER — DOXERCALCIFEROL 4 MCG/2ML IV SOLN
INTRAVENOUS | Status: AC
  Administered 2014-02-26: 2 ug via INTRAVENOUS
  Filled 2014-02-26: qty 2

## 2014-02-26 MED ORDER — DARBEPOETIN ALFA-POLYSORBATE 100 MCG/0.5ML IJ SOLN
INTRAMUSCULAR | Status: AC
  Administered 2014-02-26: 100 ug via INTRAVENOUS
  Filled 2014-02-26: qty 0.5

## 2014-02-26 NOTE — Progress Notes (Signed)
Speech Language Pathology Treatment: Dysphagia  Patient Details Name: [name] MRN: [id number] DOB: Mar 09, 1926 Today's Date: 02/26/2014 Time: 1210-1225 SLP Time Calculation (min): 15 min  Assessment / Plan / Recommendation Clinical Impression  Pt and son in attendance for assessment of diet tolerance.  Safe swallow precautions posted at [facility], reviewed with pt and son.  Pt appears to be tolerating current diet, however, appetite and intake are reduced.  Discussed that it might be helpful if smaller more frequent meals were provided throughout the day, to minimize fatigue and maximize safe po intake.  Dietician currently following pt. SLP answered questions for pt/son.  All education completed.  ST to sign off. Please reconsult if needs arise.     HPI HPI: 80 year old female admitted 02/18/14 due to abnormal labs/decreased renal function.  PMH significant for CVA (9/14), stomach CA, PUD, AFib, CKD, dementia, HTN, CXR revealed no acute disease.   Pertinent Vitals VSS  SLP Plan  All goals met;Discharge SLP treatment due to (comment)    Recommendations Diet recommendations: Dysphagia 3 (mechanical soft);Thin liquid Liquids provided via: Cup Medication Administration: Crushed with puree Supervision: Patient able to self feed;Full supervision/cueing for compensatory strategies Compensations: Slow rate;Small sips/bites;Multiple dry swallows after each bite/sip;Clear throat intermittently Postural Changes and/or Swallow Maneuvers: Upright 30-60 min after meal;Seated upright 90 degrees;Chin tuck              Oral Care Recommendations: Oral care BID Follow up Recommendations: 24 hour supervision/assistance Plan: All goals met;Discharge SLP treatment due to (comment)    GO    Charvis Lightner B. Quentin Ore [facility], CCC-SLP [phone]  Shonna Chock 02/26/2014, 1:58 PM

## 2014-02-26 NOTE — Care Management Note (Signed)
Attempted to discuss plans for this pt with pt son, Vega Stare as this CM was just informed by CIR adm coord, B Boyette that the pt was not a candidate for CIR. Mr Sager  stated that he had been waiting to watch a sports event on his computer for several weeks and wanted to watch it at this time.  In attempting to reschedule for tomorrow, and clarify his schedule for tomorrow, Thursday 02/27/14, he stated that he was just told that his mother could not go to CIR so he did not want to talk to anyone as he plans to fight that decision. Was able to schedule for tomorrow after 1pm. This CM attempted this conversation with clinical social worker, Crawford Givens. Will continue to follow for progression and attempt to discuss plans with Mr Briski tomorrow.  Jasmine Pang RN MPH, case manager, [phone]

## 2014-02-26 NOTE — Procedures (Addendum)
Patient seen on Hemodialysis. QB 300, UF goal 1.5L Treatment adjusted as needed.  Elmarie Shiley MD [facility]. Office # [phone] Pager # [phone] 2:36 PM

## 2014-02-26 NOTE — Progress Notes (Signed)
Patient ID: [name], female   DOB: May 15, 1926, 80 y.o.   MRN: [id number]   [facility] Progress Note    Assessment/ Plan:   1. End-stage renal disease: Dialysis started for progression of chronic kidney disease in the emergence of uremic symptoms (primarily affecting mentation). Anticipate her third subsequent dialysis treatment today. Status post right brachiocephalic fistula and tunneled right IJ dialysis catheter placement.  The process has been started for outpatient dialysis unit placement and it appears that she will likely need to be discharged to a skilled nursing facility.  2. GI bleed. EGD negative. Hg stable . Continue Aranesp  3. Anemia a combination of anemia of chronic kidney disease with GI bleed-improved status post packed red cell transfusion and now on Aranesp.  4. Secondary hyperparathyroidism on calcitriol for control of PTH, will add on phosphorus levels to labs from this morning-currently on calcium acetate 1334 mg 3 times a day a.c. for phosphorus binding 5. Hx A Fib, NSR now   Subjective:   Reports to be feeling better-concerned that her son is feeding her too much    Objective:   BP 108/50  Pulse 72  Temp(Src) 97.4 F (36.3 C) (Oral)  Resp 18  Ht 5\' 4"  (1.626 m)  Wt 47.6 kg (104 lb 15 oz)  BMI 18.00 kg/m2  SpO2 96%  Intake/Output Summary (Last 24 hours) at 02/26/14 1138 Last data filed at 02/26/14 0544  Gross per 24 hour  Intake    120 ml  Output   1850 ml  Net  -1730 ml   Weight change: -2.1 kg (-4 lb 10.1 oz)  Physical Exam: Gen: Comfortably resting in bed, son by bedside CVS: Pulse regular in rate and rhythm, S1 and S2 with ESM Resp: Clear to auscultation bilaterally-no rales Abd: Soft, flat, nontender Ext: No lower extremity edema  Imaging: No results found.  Labs: BMET  Recent Labs Lab 02/20/14 0220 02/21/14 0625 02/22/14 0617 02/23/14 0626 02/24/14 1200 02/25/14 1120 02/26/14 0735  NA 132* 132* 131* 130* 129*  131* 135*  K 3.7 4.1 3.6* 3.6* 3.6* 3.6* 3.9  CL 89* 94* 94* 91* 90* 93* 98  CO2 20 19 20 20 21 23 24   GLUCOSE 52* 90 90 96 98 135* 91  BUN 107* 95* 89* 97* 97* 79* 44*  CREATININE 7.54* 7.45* 7.71* 7.75* 7.91* 6.92* 4.69*  CALCIUM 7.4* 7.9* 8.1* 8.0* 8.3* 8.0* 7.9*  PHOS 7.3* 6.1* 5.4*  --   --   --   --    CBC  Recent Labs Lab 02/23/14 0626 02/24/14 1200 02/25/14 1120 02/26/14 0735  WBC 6.8 6.1 7.1 7.3  HGB 9.4* 9.6* 9.6* 9.7*  HCT 27.5* 28.8* 29.2* 29.7*  MCV 87.9 88.6 90.1 90.8  PLT 156 176 145* 118*   Medications:    . amiodarone  100 mg Oral Daily  . amLODipine  5 mg Oral Daily  . calcium acetate  1,334 mg Oral TID WC  . darbepoetin (ARANESP) injection - DIALYSIS  100 mcg Intravenous Q Wed-HD  . doxercalciferol  2 mcg Intravenous Q M,W,F-HD  . feeding supplement (NEPRO CARB STEADY)  237 mL Oral BID BM  . levothyroxine  25 mcg Oral QAC breakfast  . multivitamin  1 tablet Oral QHS  . pantoprazole  20 mg Oral Daily   Elmarie Shiley, MD 02/26/2014, 11:38 AM

## 2014-02-26 NOTE — Clinical Social Work Note (Signed)
CSW and nurse case manager informed by Danne Baxter with inpatient rehab that patient not appropriate not CIR and she had just talked with son, Herbie [place] [name]) Earnie Larsson. CSW and nurse case manager C. Royal visited room to talk with son about discharge planning (patient not in room). Mr. Long was visibly upset and expressed to CSW and case manager that he had been informed that patient not accepted by inpatient rehab and that he intends to fight decision. Son did not want to talk with CSW and case manager today as he was getting ready to watch a game, but agreed to talk with [place] on Thursday, 3/19 after 1 pm.  Elias Dennington Givens, MSW, LCSW [phone]

## 2014-02-26 NOTE — Progress Notes (Signed)
[facility] Progress Note    [name] YQM:[id number] DOB: 1926/10/27 DOA: 02/18/2014 PCP: Robyn Haber, MD  Interim summary: Patient is a pleasant 80 year old female with a past medical history of stage IV chronic kidney disease, dementia, atrial fibrillation who was on Coumadin therapy prior to this hospitalization was referred to the emergency room at [facility] by primary care provider on 02/18/2014 for abnormal blood work. Patient having a significant deterioration to kidney function with creatinine at 7. She was also found to have a hemoglobin of 6.2 with guaiac positive stools. INR was also supratherapeutic at 5.9. Patient was transfused with packed red blood cells. She had reported episodes of black tarry stools as of our GI bleed was suspected. Dr. Paulita Fujita of gastroenterology was consulted as she underwent EGD on 02/20/2014. There is no obvious source of bleed noted on procedure. GI recommending holding off on anticoagulation for several weeks. Nephrology was consulted regarding acute on chronic renal failure. Lab work did not show improvement to kidney function as nephrology recommended initiating hemodialysis. She had a right brachiocephalic fistula placed along with IJ dialysis catheter on 02/24/2014, initiating hemodialysis.     Subjective: Pt alert.c/o soreness in L. Shoulder area. Son at bedside and requesting CIR  Assessment/Plan: Principal Problem:   Upper GI bleed? - Stable, no clinical evidence of GI bleed - Patient underwent EGD which don't reveal an obvious source of bleed. GI recommended holding anticoagulation for a couple of weeks >> follow up with outpt MDs to weigh risks/benefits and decide on ?further anticoagulation   Active Problems:    PAF- now NSR - stable, continue amiodarone therapy -Anticoagulation stopped for now as above given possible GI bleed and GI recs    Stage IV chronic kidney disease.  -Nephrology consulted -On 02/24/2014  patient underwent right brachiocephalic fistula as well as right IJ dialysis catheter placement.  -Hemodialysis was started on 02/24/2014   HTN -Blood pressures are stable, continue Norvasc 5 mg by mouth daily     Anemia - Hgb 8.8 after 2 U PRBC    UTI - e coli -Rocephin discontinued-s/p completion of abx course.    Deconditioning -PT recommending SNF, but son requesting CIR- I have consulted, follw  Dementia w/o behavioral disturbances -given her comorbidities as listed above, discussed palliative consultation with son and he is agreeable, I  Called in the consult   Code Status: Full code Family Communication: son at bedside Disposition Plan: Pending clinical course.  Consultants: nephrology GI Palliative care  Procedures: EGD-3/12 R. IJ dialysis cath, and brachiocephalic fistula- 5/28  Antibiotics: S/p completion of rocephin   DVT prophylaxis: SCDs  Objective: Filed Weights   02/25/14 1747 02/26/14 1301 02/26/14 1606  Weight: 47.6 kg (104 lb 15 oz) 47.5 kg (104 lb 11.5 oz) 46.8 kg (103 lb 2.8 oz)   Blood pressure 122/50, pulse 70, temperature 97.7 F (36.5 C), temperature source Oral, resp. rate 16, height 5\' 4"  (1.626 m), weight 46.8 kg (103 lb 2.8 oz), SpO2 97.00%.  Intake/Output Summary (Last 24 hours) at 02/26/14 1737 Last data filed at 02/26/14 1606  Gross per 24 hour  Intake     60 ml  Output   2200 ml  Net  -2140 ml     Exam: General: No acute respiratory distress Lungs: Clear to auscultation bilaterally without wheezes or crackles Cardiovascular: Regular rate and rhythm without murmur gallop or rub normal S1 and S2 Abdomen: Nontender, nondistended, soft, bowel sounds positive, no rebound, no ascites, no appreciable mass  Extremities: No significant cyanosis, clubbing. decreased edema  Data Reviewed: Basic Metabolic Panel:  Recent Labs Lab 02/20/14 0220 02/21/14 0625 02/22/14 0617 02/23/14 0626 02/24/14 1200 02/25/14 1120  02/26/14 0735 02/26/14 1340  NA 132* 132* 131* 130* 129* 131* 135* 134*  K 3.7 4.1 3.6* 3.6* 3.6* 3.6* 3.9 3.9  CL 89* 94* 94* 91* 90* 93* 98 96  CO2 20 19 20 20 21 23 24 25   GLUCOSE 52* 90 90 96 98 135* 91 227*  BUN 107* 95* 89* 97* 97* 79* 44* 45*  CREATININE 7.54* 7.45* 7.71* 7.75* 7.91* 6.92* 4.69* 4.81*  CALCIUM 7.4* 7.9* 8.1* 8.0* 8.3* 8.0* 7.9* 7.8*  PHOS 7.3* 6.1* 5.4*  --   --   --  3.2 3.2   Liver Function Tests:  Recent Labs Lab 02/20/14 0220 02/21/14 0625 02/22/14 0617 02/26/14 1340  ALBUMIN 1.9* 1.7* 1.6* 1.5*   No results found for this basename: LIPASE, AMYLASE,  in the last 168 hours No results found for this basename: AMMONIA,  in the last 168 hours CBC:  Recent Labs Lab 02/23/14 0626 02/24/14 1200 02/25/14 1120 02/26/14 0735 02/26/14 1340  WBC 6.8 6.1 7.1 7.3 8.3  HGB 9.4* 9.6* 9.6* 9.7* 9.8*  HCT 27.5* 28.8* 29.2* 29.7* 29.9*  MCV 87.9 88.6 90.1 90.8 92.0  PLT 156 176 145* 118* 123*   Cardiac Enzymes: No results found for this basename: CKTOTAL, CKMB, CKMBINDEX, TROPONINI,  in the last 168 hours BNP (last 3 results) No results found for this basename: PROBNP,  in the last 8760 hours CBG: No results found for this basename: GLUCAP,  in the last 168 hours  Recent Results (from the past 240 hour(s))  URINE CULTURE     Status: None   Collection Time    02/18/14 12:59 PM      Result Value Ref Range Status   Specimen Description URINE, CATHETERIZED   Final   Special Requests NONE   Final   Culture  Setup Time     Final   Value: 02/19/2014 08:38     Performed at [facility]     Final   Value: >=100,000 COLONIES/ML     Performed at [facility]   Culture     Final   Value: ESCHERICHIA COLI     Performed at [facility]   Report Status 02/20/2014 FINAL   Final   Organism ID, Bacteria ESCHERICHIA COLI   Final  MRSA PCR SCREENING     Status: None   Collection Time    02/18/14  6:14 PM      Result Value  Ref Range Status   MRSA by PCR NEGATIVE  NEGATIVE Final   Comment:            The GeneXpert MRSA Assay (FDA     approved for NASAL specimens     only), is one component of a     comprehensive MRSA colonization     surveillance program. It is not     intended to diagnose MRSA     infection nor to guide or     monitor treatment for     MRSA infections.  SURGICAL PCR SCREEN     Status: None   Collection Time    02/23/14 11:26 PM      Result Value Ref Range Status   MRSA, PCR NEGATIVE  NEGATIVE Final   Staphylococcus aureus NEGATIVE  NEGATIVE Final   Comment:  The Xpert SA Assay (FDA     approved for NASAL specimens     in patients over 49 years of age),     is one component of     a comprehensive surveillance     program.  Test performance has     been validated by [employer] for patients greater     than or equal to 13 year old.     It is not intended     to diagnose infection nor to     guide or monitor treatment.     Studies:  Recent x-ray studies have been reviewed in detail by the Attending Physician  Scheduled Meds:  Scheduled Meds: . amiodarone  100 mg Oral Daily  . amLODipine  5 mg Oral Daily  . calcium acetate  1,334 mg Oral TID WC  . darbepoetin (ARANESP) injection - DIALYSIS  100 mcg Intravenous Q Wed-HD  . doxercalciferol  2 mcg Intravenous Q M,W,F-HD  . feeding supplement (NEPRO CARB STEADY)  237 mL Oral BID BM  . levothyroxine  25 mcg Oral QAC breakfast  . multivitamin  1 tablet Oral QHS  . pantoprazole  20 mg Oral Daily   Continuous Infusions: . sodium chloride 50 mL/hr at 02/23/14 2355    Time spent on care of this patient: 35 min   Sheila Oats, MD [phone] [facility] Office  [phone] Pager - Text Page per Shea Evans as per below:  On-Call/Text Page:      [email or web address]  If 7PM-7AM, please contact night-coverage [email or web address] 02/26/2014, 5:37 PM   LOS: 8 days

## 2014-02-26 NOTE — Progress Notes (Addendum)
INITIAL NUTRITION ASSESSMENT  DOCUMENTATION CODES Per approved criteria  -Severe malnutrition in the context of chronic illness -Underweight   Pt meets criteria for severe MALNUTRITION in the context of chronic illness as evidenced by severe fat and muscle wasting.  INTERVENTION: Continue Nepro Shake po BID, each supplement provides 425 kcal and 19 grams protein. RD provided diet education material to son. Provided sample meal plans, Choose a Meal Booklet, and snack ideas for HD. RD to continue to follow nutrition care plan.  NUTRITION DIAGNOSIS: Inadequate oral intake related to chronic illness as evidenced by reported wt loss. Ongoing.  Goal: Pt to meet >/= 90% of their estimated nutrition needs. Unmet.  Monitor:  Wt trends, I/O's, labs, po intake  ASSESSMENT: 80 y.o. female with PMH significant for stroke, stomach cancer, PUD, A fib on coumadin, CKD stage 3, who was referred to the ED for further evaluation by Dr Deterding due to worsening renal function (Cr at 7).   Pt underwent endoscopy on 3/12 for dark stools and concern for possible GI bleed.  Renal labs not showing any improvement. Underwent permacath placement on 3/16, CLIP process started. HD started 3/16.  BSE completed by SLP on 3/13 with recommendations for Dysphagia 3 with thin liquids. Pt is eating fair, discussed with on at bedside. Pt will consume Nepro Shakes. Son is asking if patient can go to CIR and also if he can speak with MD about possibly donating his kidney to his mother. Discussed with case management.  Potassium WNL Phosphorus elevated at 5.4   Height: Ht Readings from Last 1 Encounters:  02/22/14 5\' 4"  (1.626 m)    Weight: Wt Readings from Last 1 Encounters:  02/25/14 104 lb 15 oz (47.6 kg)    BMI:  Body mass index is 18 kg/(m^2). Underweight  Estimated Nutritional Needs: Kcal: 1400-1600 Protein: 70-80 g Fluid: >1.6 L/day  Skin:  unstageable full thickness wound to mid coccyx Stage  II to upper coccyx  Diet Order: Renal  EDUCATION NEEDS: -Education needs addressed   Intake/Output Summary (Last 24 hours) at 02/26/14 1100 Last data filed at 02/26/14 0544  Gross per 24 hour  Intake    120 ml  Output   1850 ml  Net  -1730 ml    Last BM: 3/18  Labs:   Recent Labs Lab 02/20/14 0220 02/21/14 0625 02/22/14 0617  02/24/14 1200 02/25/14 1120 02/26/14 0735  NA 132* 132* 131*  < > 129* 131* 135*  K 3.7 4.1 3.6*  < > 3.6* 3.6* 3.9  CL 89* 94* 94*  < > 90* 93* 98  CO2 20 19 20   < > 21 23 24   BUN 107* 95* 89*  < > 97* 79* 44*  CREATININE 7.54* 7.45* 7.71*  < > 7.91* 6.92* 4.69*  CALCIUM 7.4* 7.9* 8.1*  < > 8.3* 8.0* 7.9*  PHOS 7.3* 6.1* 5.4*  --   --   --   --   GLUCOSE 52* 90 90  < > 98 135* 91  < > = values in this interval not displayed.  CBG (last 3)  No results found for this basename: GLUCAP,  in the last 72 hours  Scheduled Meds: . amiodarone  100 mg Oral Daily  . amLODipine  5 mg Oral Daily  . calcium acetate  1,334 mg Oral TID WC  . darbepoetin (ARANESP) injection - DIALYSIS  100 mcg Intravenous Q Wed-HD  . doxercalciferol  2 mcg Intravenous Q M,W,F-HD  . feeding supplement (NEPRO CARB  STEADY)  237 mL Oral BID BM  . levothyroxine  25 mcg Oral QAC breakfast  . multivitamin  1 tablet Oral QHS  . pantoprazole  20 mg Oral Daily    Continuous Infusions: . sodium chloride 50 mL/hr at 02/23/14 0227   Inda Coke MS, RD, LDN Inpatient Registered Dietitian Pager: [phone] After-hours pager: [phone]

## 2014-02-26 NOTE — Consult Note (Signed)
Physical Medicine and Rehabilitation Consult Reason for Consult: Deconditioning/multi-medical Referring Physician: [facility]   HPI: [name] is a 80 y.o. right-handed female with history of CVA, stomach cancer, atrial fibrillation on Coumadin therapy and chronic renal insufficiency with baseline creatinine 2.16. Patient lives with son and assistance as needed. Admitted 02/18/2014 with generalized lethargy as well as elevated creatinine 7.8, BUN 122, hemoglobin 6.2 and INR of 5.9. Guaiac stool positive. Underwent transfusion. Renal ultrasound with no hydronephrosis. Renal services followup with hemodialysis initiated with a right internal jugular dialysis catheter placement. Gastroenterology Dr. Paulita Fujita and underwent EGD with no obvious source of anemia. Her anticoagulation was held. Hemoglobin and hematocrit remained stable with latest hemoglobin 9.7. Physical therapy evaluation completed 02/21/2014 with slow progressive gains. M.D. has requested physical medicine rehabilitation consult   Review of Systems  Cardiovascular: Positive for palpitations.  Musculoskeletal: Positive for myalgias.  Neurological: Positive for weakness.  Psychiatric/Behavioral: Positive for memory loss.  All other systems reviewed and are negative.   Past Medical History  Diagnosis Date  . Dementia   . Hypertension   . Ulcer     Hx of  . Stomach cancer   . Snoring   . Fracture, intertrochanteric, left femur 03/25/2012  . Fracture, intertrochanteric, right femur 06/2008    s/p closed reduction and internal fixation  . Nodule of left lung     followed by Dr. Elsworth Soho // LLL spiculated nodule noted per CT in 2012 1.5x1.6x2.4, PET in 06/03/2011 was low uptake.  . Paroxysmal atrial fibrillation   . Tachycardia-bradycardia    Past Surgical History  Procedure Laterality Date  . Stomach surgery      3/4 of stomach removed due to stomach cancer  . Hip surgery  2009    right  . Total hip arthroplasty  03/29/2012     Procedure: TOTAL HIP ARTHROPLASTY;  Surgeon: Johnny Bridge, MD;  Location: [place];  Service: Orthopedics;  Laterality: Left;  Left Hip Hemiarthroplasty  . Esophagogastroduodenoscopy N/A 02/20/2014    Procedure: ESOPHAGOGASTRODUODENOSCOPY (EGD);  Surgeon: Arta Silence, MD;  Location[facility] ENDOSCOPY;  Service: Endoscopy;  Laterality: N/A;  . Insertion of dialysis catheter Right 02/24/2014    Procedure: INSERTION OF DIALYSIS CATHETER;  Surgeon: Conrad [place], MD;  Location: [place];  Service: Vascular;  Laterality: Right;  . Av fistula placement Right 02/24/2014    Procedure: ARTERIOVENOUS (AV) FISTULA CREATION;  Surgeon: Conrad , MD;  Location: [place];  Service: Vascular;  Laterality: Right;   Family History  Problem Relation Age of Onset  . Cancer Father   . Liver cancer Son   . Allergies     Social History:  reports that she quit smoking about 40 years ago. Her smoking use included Cigarettes. She has a .6 pack-year smoking history. She has never used smokeless tobacco. She reports that she drinks alcohol. Her drug history is not on file. Allergies: No Known Allergies Medications Prior to Admission  Medication Sig Dispense Refill  . amiodarone (PACERONE) 200 MG tablet Take 100 mg by mouth daily.      Marland Kitchen atorvastatin (LIPITOR) 40 MG tablet Take 40 mg by mouth daily at 6 PM.       . calcitRIOL (ROCALTROL) 0.25 MCG capsule Take 0.25 mcg by mouth daily.      . Cyanocobalamin (VITAMIN B 12 PO) Take 1 capsule by mouth 3 (three) times a week.       . furosemide (LASIX) 20 MG tablet Take 20-40  mg by mouth 2 (two) times daily. Takes 2 tablets in am and 1 tablet in pm      . IRON PO Take 1 capsule twice daily (ferrous)      . levothyroxine (SYNTHROID) 25 MCG tablet Take 1 tablet (25 mcg total) by mouth daily before breakfast.  30 tablet  3  . Multiple Vitamin (MULTIVITAMIN) capsule Take 1 capsule by mouth daily.      . sodium bicarbonate 650 MG tablet Take 650 mg by mouth 4 (four) times daily.        Marland Kitchen warfarin (COUMADIN) 5 MG tablet Take 2.5 mg by mouth daily at 6 PM. Take as directed by coumadin clinic        Home: Home Living Family/patient expects to be discharged to:: Private residence Living Arrangements: Children (Son) Available Help at Discharge: Family;Available 24 hours/day Type of Home: House Home Access: Ramped entrance Home Layout: Two level;Able to live on main level with bedroom/bathroom Home Equipment: Gilford Rile - 2 wheels;Cane - single point;Shower seat  Functional History:   Functional Status:  Mobility:     Ambulation/Gait Ambulation Distance (Feet): 26 Feet General Gait Details: unable to progress to gait to day due to increased assist needed and precaution to protect IV site in foot    ADL:    Cognition: Cognition Overall Cognitive Status: History of cognitive impairments - at baseline Orientation Level: Oriented to person;Disoriented to time;Disoriented to situation;Oriented to place Cognition Arousal/Alertness: Awake/alert Behavior During Therapy: WFL for tasks assessed/performed Overall Cognitive Status: History of cognitive impairments - at baseline  Blood pressure 108/50, pulse 72, temperature 97.4 F (36.3 C), temperature source Oral, resp. rate 18, height 5\' 4"  (1.626 m), weight 47.6 kg (104 lb 15 oz), SpO2 96.00%. Physical Exam  Vitals reviewed. HENT:  Head: Normocephalic.  Eyes: EOM are normal.  Neck: Normal range of motion. Neck supple. No thyromegaly present.  Cardiovascular:  Cardiac rate control  GI: Soft. Bowel sounds are normal. She exhibits no distension.  Neurological: She is alert.  Patient was able to provide her name and age. She was not a good medical historian. She did follow simple commands. Easily distracted/confused. Moved all 4 limbs, 3+ to 4/5. Left leg limited due to IV/discomfort  Skin: Skin is warm and dry.  Psychiatric: She has a normal mood and affect.    Results for orders placed during the hospital encounter  of 02/18/14 (from the past 24 hour(s))  HEPATITIS B SURFACE ANTIBODY     Status: None   Collection Time    02/25/14  3:00 PM      Result Value Ref Range   Hep B S Ab NEGATIVE  NEGATIVE  HEPATITIS B CORE ANTIBODY, TOTAL     Status: None   Collection Time    02/25/14  3:00 PM      Result Value Ref Range   Hep B Core Total Ab NON REACTIVE  NON REACTIVE  BASIC METABOLIC PANEL     Status: Abnormal   Collection Time    02/26/14  7:35 AM      Result Value Ref Range   Sodium 135 (*) 137 - 147 mEq/L   Potassium 3.9  3.7 - 5.3 mEq/L   Chloride 98  96 - 112 mEq/L   CO2 24  19 - 32 mEq/L   Glucose, Bld 91  70 - 99 mg/dL   BUN 44 (*) 6 - 23 mg/dL   Creatinine, Ser 4.69 (*) 0.50 - 1.10 mg/dL   Calcium  7.9 (*) 8.4 - 10.5 mg/dL   GFR calc non Af Amer 8 (*) >90 mL/min   GFR calc Af Amer 9 (*) >90 mL/min  CBC     Status: Abnormal   Collection Time    02/26/14  7:35 AM      Result Value Ref Range   WBC 7.3  4.0 - 10.5 K/uL   RBC 3.27 (*) 3.87 - 5.11 MIL/uL   Hemoglobin 9.7 (*) 12.0 - 15.0 g/dL   HCT 29.7 (*) 36.0 - 46.0 %   MCV 90.8  78.0 - 100.0 fL   MCH 29.7  26.0 - 34.0 pg   MCHC 32.7  30.0 - 36.0 g/dL   RDW 17.5 (*) 11.5 - 15.5 %   Platelets 118 (*) 150 - 400 K/uL   No results found.  Assessment/Plan: Diagnosis: deconditioning related to acute on chronic renal failure 1. Does the need for close, 24 hr/day medical supervision in concert with the patient's rehab needs make it unreasonable for this patient to be served in a less intensive setting? No 2. Co-Morbidities requiring supervision/potential complications: see above 3. Due to bladder management, bowel management, skin/wound care and disease management, does the patient require 24 hr/day rehab nursing? No 4. Does the patient require coordinated care of a physician, rehab nurse, PT,OT to address physical and functional deficits in the context of the above medical diagnosis(es)? No Addressing deficits in the following areas:  balance, locomotion, strength, bowel/bladder control, bathing, dressing and toileting 5. Can the patient actively participate in an intensive therapy program of at least 3 hrs of therapy per day at least 5 days per week? No 6. The potential for patient to make measurable gains while on inpatient rehab is fair 7. Anticipated functional outcomes upon discharge from inpatient rehab are n?a with PT, n/a with OT, n/a with SLP. 8. Estimated rehab length of stay to reach the above functional goals is: n/a 9. Does the patient have adequate social supports to accommodate these discharge functional goals? No and Potentially 10. Anticipated D/C setting: other 11. Anticipated post D/C treatments: other 12. Overall Rehab/Functional Prognosis: fair  RECOMMENDATIONS: This patient's condition is appropriate for continued rehabilitative care in the following setting: SNF Patient has agreed to participate in recommended program. Potentially Note that insurance prior authorization may be required for reimbursement for recommended care.  Comment:  Meredith Staggers, MD, [facility]     02/26/2014

## 2014-02-26 NOTE — Telephone Encounter (Signed)
Called and spoke with [place]. Was advised pt currently admitted over at [facility]. He is wanting pt to do PET scan while in hospital. Pt had CXR per [facility]. I advised him insurance does not pay for PET scans while admitted. It is done when outpatient. He reports they will pay out of pocket for this if need be so he doesn't see what the problem is. He asked I ask Dr. Elsworth Soho regarding this. Per brad they have had 7 deaths they have been dealing with and pt had stroke in Sept reason pt never had PET scan as ordered by Dr. Elsworth Soho. Please advise thanks

## 2014-02-26 NOTE — Progress Notes (Signed)
Pt currently in hemodialysis. I met with pt's son, [name], in pt's room to discuss our rehab consult. Dr. Naaman Plummer assessed pt today and feels pt most appropriate for SNF rehab at this time. I explained this to pt's son [name]. Discussed that pt not a candidate and beds also not readily available for his Mom even if she was deemed a candidate. I have discussed with RN Lynd and SW. [phone]

## 2014-02-27 DIAGNOSIS — N186 End stage renal disease: Secondary | ICD-10-CM

## 2014-02-27 MED ORDER — ACETAMINOPHEN 325 MG PO TABS
650.0000 mg | ORAL_TABLET | ORAL | Status: DC | PRN
  Administered 2014-02-27 – 2014-03-03 (×7): 650 mg via ORAL
  Filled 2014-02-27 (×7): qty 2

## 2014-02-27 MED ORDER — ACETAMINOPHEN 650 MG RE SUPP: 650.0000 mg | RECTAL | Status: DC | PRN

## 2014-02-27 NOTE — Telephone Encounter (Signed)
Since it has been a year since last scan, treating MD (hospitalist ) can order a CT chest in the hospital & we can see in FU after

## 2014-02-27 NOTE — Telephone Encounter (Signed)
Called, spoke with [name], pt's son.  Advised of below per RA.  He verbalized understanding of this and is in agreement with this plan.  He will discuss this with the treating MD at the hospital and will call back if he has any further questions or concerns.  He will call back to schedule a FU with RA.

## 2014-02-27 NOTE — Progress Notes (Signed)
Patient ID: [name], female   DOB: [birth year], 80 y.o.   MRN: [id number]   [facility] Progress Note   Assessment/ Plan:   1. End-stage renal disease: Dialysis started for progressive chronic kidney disease and associated changes with mentation likely from uremic encephalopathy-plan for her next dialysis treatment tomorrow after 3 consecutive treatments over the past 3 days. Mental status has improved notably since initiation of dialysis. She is status post right brachiocephalic fistula and tunneled right IJ dialysis catheter placement. The process has been started for outpatient dialysis unit placement and it appears that she will likely need to be discharged to a skilled nursing facility.  2. GI bleed. EGD negative. Hg stable . Continue Aranesp  3. Anemia a combination of anemia of chronic kidney disease with GI bleed-improved status post packed red cell transfusion and now on Aranesp.  4. Secondary hyperparathyroidism on calcitriol for control of PTH, will add on phosphorus levels to labs from this morning-currently on calcium acetate 1334 mg 3 times a day a.c. for phosphorus binding  5. Hx A Fib, NSR now  Subjective:   Notes reviewed from the evaluation by CIR/case management/social worker-she was turned down for CIR admission and is deemed more suitable for SNF admission. Apparently son upset with this decision and plans on "challenging it".    Objective:   BP 108/40  Pulse 65  Temp(Src) 98.4 F (36.9 C) (Oral)  Resp 16  Ht 5\' 4"  (1.626 m)  Wt 46.4 kg (102 lb 4.7 oz)  BMI 17.55 kg/m2  SpO2 96%  Physical Exam: Gen: Comfortable resting in bed CVS: Pulse regular in rate and rhythm, S1 and S2 with ESM Resp: Clear to auscultation bilaterally-no rales Abd: Soft, flat, nontender Ext: Trace ankle edema. Newly created right brachiocephalic fistula with palpable thrill around the antecubital area  Labs: BMET  Recent Labs Lab 02/21/14 0625 02/22/14 0617  02/23/14 0626 02/24/14 1200 02/25/14 1120 02/26/14 0735 02/26/14 1340  NA 132* 131* 130* 129* 131* 135* 134*  K 4.1 3.6* 3.6* 3.6* 3.6* 3.9 3.9  CL 94* 94* 91* 90* 93* 98 96  CO2 19 20 20 21 23 24 25   GLUCOSE 90 90 96 98 135* 91 227*  BUN 95* 89* 97* 97* 79* 44* 45*  CREATININE 7.45* 7.71* 7.75* 7.91* 6.92* 4.69* 4.81*  CALCIUM 7.9* 8.1* 8.0* 8.3* 8.0* 7.9* 7.8*  PHOS 6.1* 5.4*  --   --   --  3.2 3.2   CBC  Recent Labs Lab 02/24/14 1200 02/25/14 1120 02/26/14 0735 02/26/14 1340  WBC 6.1 7.1 7.3 8.3  HGB 9.6* 9.6* 9.7* 9.8*  HCT 28.8* 29.2* 29.7* 29.9*  MCV 88.6 90.1 90.8 92.0  PLT 176 145* 118* 123*   Medications:    . amiodarone  100 mg Oral Daily  . amLODipine  5 mg Oral Daily  . calcium acetate  1,334 mg Oral TID WC  . darbepoetin (ARANESP) injection - DIALYSIS  100 mcg Intravenous Q Wed-HD  . doxercalciferol  2 mcg Intravenous Q M,W,F-HD  . feeding supplement (NEPRO CARB STEADY)  237 mL Oral BID BM  . levothyroxine  25 mcg Oral QAC breakfast  . multivitamin  1 tablet Oral QHS  . pantoprazole  20 mg Oral Daily   Elmarie Shiley, MD 02/27/2014, 9:37 AM

## 2014-02-27 NOTE — Progress Notes (Addendum)
[facility] Progress Note    [name] WUJ:[id number] DOB: 06/19/1926 DOA: 02/18/2014 PCP: Robyn Haber, MD  Interim summary: Patient is a pleasant 80 year old female with a past medical history of stage IV chronic kidney disease, dementia, atrial fibrillation who was on Coumadin therapy prior to this hospitalization was referred to the emergency room at [facility] by primary care provider on 02/18/2014 for abnormal blood work. Patient having a significant deterioration to kidney function with creatinine at 7. She was also found to have a hemoglobin of 6.2 with guaiac positive stools. INR was also supratherapeutic at 5.9. Patient was transfused with packed red blood cells. She had reported episodes of black tarry stools as of our GI bleed was suspected. Dr. Paulita Fujita of gastroenterology was consulted as she underwent EGD on 02/20/2014. There is no obvious source of bleed noted on procedure. GI recommending holding off on anticoagulation for several weeks. Nephrology was consulted regarding acute on chronic renal failure. Lab work did not show improvement to kidney function as nephrology recommended initiating hemodialysis. She had a right brachiocephalic fistula placed along with IJ dialysis catheter on 02/24/2014, initiating hemodialysis.     Subjective: Denies any complaints  Assessment/Plan: Principal Problem:   Upper GI bleed? - Stable, no clinical evidence of GI bleed - Patient underwent EGD which don't reveal an obvious source of bleed. GI recommended holding anticoagulation for a couple of weeks >> follow up with outpt MDs to weigh risks/benefits and decide on ?further anticoagulation   Active Problems:    PAF- now NSR - stable, continue amiodarone therapy -Anticoagulation stopped for now as above given possible GI bleed and GI recs    Stage IV chronic kidney disease.  -Nephrology consulted -On 02/24/2014 patient underwent right brachiocephalic fistula as well as  right IJ dialysis catheter placement.  -Hemodialysis was started on 02/24/2014   HTN -Blood pressures are stable, continue Norvasc 5 mg by mouth daily     Anemia - Hgb 8.8 after 2 U PRBC    UTI - e coli -Rocephin discontinued-s/p completion of abx course.    Deconditioning -PT recommending SNF, -seen by CIR and declined  Dementia w/o behavioral disturbances -given her comorbidities as listed above, discussed palliative care/Dr Lovena Le- son wants to continue with current care and keep pt full code -appreciate palliative care assistance   Code Status: Full code Family Communication: son at bedside Disposition Plan: To SNF when ok with renal  Consultants: nephrology GI Palliative care  Procedures: EGD-3/12 R. IJ dialysis cath, and brachiocephalic fistula- 9/56  Antibiotics: S/p completion of rocephin   DVT prophylaxis: SCDs  Objective: Filed Weights   02/26/14 1301 02/26/14 1606 02/26/14 1900  Weight: 47.5 kg (104 lb 11.5 oz) 46.8 kg (103 lb 2.8 oz) 46.4 kg (102 lb 4.7 oz)   Blood pressure 84/67, pulse 108, temperature 97.4 F (36.3 C), temperature source Oral, resp. rate 16, height 5\' 4"  (1.626 m), weight 46.4 kg (102 lb 4.7 oz), SpO2 94.00%.  Intake/Output Summary (Last 24 hours) at 02/27/14 1839 Last data filed at 02/27/14 0800  Gross per 24 hour  Intake    240 ml  Output    450 ml  Net   -210 ml     Exam: General: No acute respiratory distress Lungs: Clear to auscultation bilaterally without wheezes or crackles Cardiovascular: Regular rate and rhythm without murmur gallop or rub normal S1 and S2 Abdomen: Nontender, nondistended, soft, bowel sounds positive, no rebound, no ascites, no appreciable mass Extremities: No significant cyanosis,  clubbing. decreased edema  Data Reviewed: Basic Metabolic Panel:  Recent Labs Lab 02/21/14 0625 02/22/14 0617 02/23/14 0626 02/24/14 1200 02/25/14 1120 02/26/14 0735 02/26/14 1340  NA 132* 131* 130* 129*  131* 135* 134*  K 4.1 3.6* 3.6* 3.6* 3.6* 3.9 3.9  CL 94* 94* 91* 90* 93* 98 96  CO2 19 20 20 21 23 24 25   GLUCOSE 90 90 96 98 135* 91 227*  BUN 95* 89* 97* 97* 79* 44* 45*  CREATININE 7.45* 7.71* 7.75* 7.91* 6.92* 4.69* 4.81*  CALCIUM 7.9* 8.1* 8.0* 8.3* 8.0* 7.9* 7.8*  PHOS 6.1* 5.4*  --   --   --  3.2 3.2   Liver Function Tests:  Recent Labs Lab 02/21/14 0625 02/22/14 0617 02/26/14 1340  ALBUMIN 1.7* 1.6* 1.5*   No results found for this basename: LIPASE, AMYLASE,  in the last 168 hours No results found for this basename: AMMONIA,  in the last 168 hours CBC:  Recent Labs Lab 02/23/14 0626 02/24/14 1200 02/25/14 1120 02/26/14 0735 02/26/14 1340  WBC 6.8 6.1 7.1 7.3 8.3  HGB 9.4* 9.6* 9.6* 9.7* 9.8*  HCT 27.5* 28.8* 29.2* 29.7* 29.9*  MCV 87.9 88.6 90.1 90.8 92.0  PLT 156 176 145* 118* 123*   Cardiac Enzymes: No results found for this basename: CKTOTAL, CKMB, CKMBINDEX, TROPONINI,  in the last 168 hours BNP (last 3 results) No results found for this basename: PROBNP,  in the last 8760 hours CBG: No results found for this basename: GLUCAP,  in the last 168 hours  Recent Results (from the past 240 hour(s))  URINE CULTURE     Status: None   Collection Time    02/18/14 12:59 PM      Result Value Ref Range Status   Specimen Description URINE, CATHETERIZED   Final   Special Requests NONE   Final   Culture  Setup Time     Final   Value: 02/19/2014 08:38     Performed at [place]     Final   Value: >=100,000 COLONIES/ML     Performed at [facility]   Culture     Final   Value: ESCHERICHIA COLI     Performed at [facility]   Report Status 02/20/2014 FINAL   Final   Organism ID, Bacteria ESCHERICHIA COLI   Final  MRSA PCR SCREENING     Status: None   Collection Time    02/18/14  6:14 PM      Result Value Ref Range Status   MRSA by PCR NEGATIVE  NEGATIVE Final   Comment:            The GeneXpert MRSA Assay (FDA      approved for NASAL specimens     only), is one component of a     comprehensive MRSA colonization     surveillance program. It is not     intended to diagnose MRSA     infection nor to guide or     monitor treatment for     MRSA infections.  SURGICAL PCR SCREEN     Status: None   Collection Time    02/23/14 11:26 PM      Result Value Ref Range Status   MRSA, PCR NEGATIVE  NEGATIVE Final   Staphylococcus aureus NEGATIVE  NEGATIVE Final   Comment:            The Xpert SA Assay (FDA  approved for NASAL specimens     in patients over 28 years of age),     is one component of     a comprehensive surveillance     program.  Test performance has     been validated by [employer] for patients greater     than or equal to 70 year old.     It is not intended     to diagnose infection nor to     guide or monitor treatment.     Studies:  Recent x-ray studies have been reviewed in detail by the Attending Physician  Scheduled Meds:  Scheduled Meds: . amiodarone  100 mg Oral Daily  . amLODipine  5 mg Oral Daily  . calcium acetate  1,334 mg Oral TID WC  . darbepoetin (ARANESP) injection - DIALYSIS  100 mcg Intravenous Q Wed-HD  . doxercalciferol  2 mcg Intravenous Q M,W,F-HD  . feeding supplement (NEPRO CARB STEADY)  237 mL Oral BID BM  . levothyroxine  25 mcg Oral QAC breakfast  . multivitamin  1 tablet Oral QHS  . pantoprazole  20 mg Oral Daily   Continuous Infusions: . sodium chloride Stopped (02/27/14 0700)    Time spent on care of this patient: 12 min   Sheila Oats, MD [place]  [phone] Pager - Text Page per Amion as per below:  On-Call/Text Page:      [email or web address]  If 7PM-7AM, please contact night-coverage [email or web address] 02/27/2014, 6:39 PM   LOS: 9 days

## 2014-02-27 NOTE — Consult Note (Signed)
Patient BB:[name]      DOB: 1926/09/30      QBV:[id number]  Met with patient and son [name] who asserts himself and POA and primary caregive.  Half way into the interview [name] requested that he be permitted to record the meeting. Patient is unable to fully participate due to midl dementia.  [name] did not recall that we had set a 12 noon meeting time and called me on the phone 5 minutes before our visit.  I reminded him that we set a noon meeting. [name] seemed somewhat disorganized and confused.   I used our COPE model to discuss [employer] health.  [name] understands that dialysis is a life supporting treatment.  He does not have insight into the significance of this illness for his mother and I believe that he may have some unrealistic expectations for what dialysis can do for her.  He is unable to recognize her frailty and focuses on what a great time they have been having with each other despite her mild dementia.  He expresses the goals of care for his mother as continuing currative treatments including dialysis, returns to hospital, antibiotics and chronic medications.  I reviewed a MOST form with him.  He did not desire to complete it at this time, but did state that she has a living will.  He became defensive when I started to broach the topic of her frailty and the nature of her illness.  He affirmed full code status with full treat at this time.  He is thinking of using [facility] where [name] was before to have rehab.    Patient has pain at catheter site and at the site of her sacral decub.   Recommend:  1.  Full code at son's request, he is not open to fully discussing her advanced care planning at this time.  I let him know that palliative is available at [facility] and on return to the hospital to discuss goals.  2.  Sacral decub: local care , nutritional supplementation.  Order air mattress overlay  3.  Right shoulder pain: son doesn't want to use opiates.  I concur if Tylenol  will take the pain down without causing confusion.  4.  Dementia: son with unrealistic expectations for dialysis in the context of dementia.  Will ask Dr. Posey Pronto to continue to update son on long term pros and cons of dialysis .   Total time: 12-12:45 pm  Leslie Aguinaldo L. Lovena Le, MD MBA The Palliative Medicine Team at [facility] Phone: [phone] Pager: [phone]

## 2014-02-28 LAB — CBC
HEMATOCRIT: 27.4 % — AB (ref 36.0–46.0)
HEMOGLOBIN: 8.7 g/dL — AB (ref 12.0–15.0)
MCH: 29.8 pg (ref 26.0–34.0)
MCHC: 31.8 g/dL (ref 30.0–36.0)
MCV: 93.8 fL (ref 78.0–100.0)
Platelets: 105 10*3/uL — ABNORMAL LOW (ref 150–400)
RBC: 2.92 MIL/uL — ABNORMAL LOW (ref 3.87–5.11)
RDW: 18.8 % — AB (ref 11.5–15.5)
WBC: 7.8 10*3/uL (ref 4.0–10.5)

## 2014-02-28 LAB — RENAL FUNCTION PANEL
ALBUMIN: 1.4 g/dL — AB (ref 3.5–5.2)
BUN: 34 mg/dL — AB (ref 6–23)
CHLORIDE: 97 meq/L (ref 96–112)
CO2: 29 mEq/L (ref 19–32)
CREATININE: 4.26 mg/dL — AB (ref 0.50–1.10)
Calcium: 7.8 mg/dL — ABNORMAL LOW (ref 8.4–10.5)
GFR calc Af Amer: 10 mL/min — ABNORMAL LOW (ref >90)
GFR calc non Af Amer: 9 mL/min — ABNORMAL LOW (ref >90)
GLUCOSE: 95 mg/dL (ref 70–99)
Phosphorus: 2.4 mg/dL (ref 2.3–4.6)
Potassium: 3.7 mEq/L (ref 3.7–5.3)
Sodium: 136 mEq/L — ABNORMAL LOW (ref 137–147)

## 2014-02-28 MED ORDER — DOXERCALCIFEROL 4 MCG/2ML IV SOLN
INTRAVENOUS | Status: AC
  Filled 2014-02-28: qty 2

## 2014-02-28 NOTE — Progress Notes (Signed)
[facility] Progress Note    [name]:[id number] DOB: 10/21/1926 DOA: 02/18/2014 PCP: Robyn Haber, MD  Interim summary: Patient is a pleasant 80 year old female with a past medical history of stage IV chronic kidney disease, dementia, atrial fibrillation who was on Coumadin therapy prior to this hospitalization was referred to the emergency room at [facility] by primary care provider on 02/18/2014 for abnormal blood work. Patient having a significant deterioration to kidney function with creatinine at 7. She was also found to have a hemoglobin of 6.2 with guaiac positive stools. INR was also supratherapeutic at 5.9. Patient was transfused with packed red blood cells. She had reported episodes of black tarry stools as of our GI bleed was suspected. Dr. Paulita Fujita of gastroenterology was consulted as she underwent EGD on 02/20/2014. There is no obvious source of bleed noted on procedure. GI recommending holding off on anticoagulation for couple of weeks. Nephrology was consulted regarding acute on chronic renal failure. Lab work did not show improvement to kidney function as nephrology recommended initiating hemodialysis. She had a right brachiocephalic fistula placed along with IJ dialysis catheter on 02/24/2014, initiating hemodialysis.     Subjective: Seen on dialysis, no new complaints reported.  Assessment/Plan: Principal Problem:   Upper GI bleed? - Stable, no clinical evidence of GI bleed - Patient underwent EGD which did not reveal an obvious source of bleed. GI recommended holding anticoagulation for a couple of weeks >> follow up with outpt MDs to weigh risks/benefits and decide on ?further anticoagulation   Active Problems:    PAF- now NSR - stable, continue amiodarone therapy -Anticoagulation stopped for now as above given possible GI bleed and GI recs    Stage IV chronic kidney disease.  -Nephrology consulted -On 02/24/2014 patient underwent right  brachiocephalic fistula as well as right IJ dialysis catheter placement.  -Hemodialysis was started on 02/24/2014   HTN -Blood pressures are stable, continue Norvasc 5 mg by mouth daily     Anemia - Was transfused 2 U PRBC, hemoglobin remains stable.    UTI - e coli -Rocephin discontinued-s/p completion of abx course.    Deconditioning -PT recommending SNF, -seen by CIR and declined  Dementia w/o behavioral disturbances -given her comorbidities as listed above, discussed palliative care/Dr Lovena Le- son wants to continue with current care and keep pt full code -appreciate palliative care assistance   Code Status: Full code Family Communication: son at bedside Disposition Plan: To SNF when ok with renal>> awaiting outpatient hemodialysis unit assignment  Consultants: nephrology GI Palliative care  Procedures: EGD-3/12 R. IJ dialysis cath, and brachiocephalic fistula- 7/67  Antibiotics: S/p completion of rocephin   DVT prophylaxis: SCDs  Objective: Filed Weights   02/27/14 2103 02/28/14 0709 02/28/14 1055  Weight: 46.75 kg (103 lb 1 oz) 46.2 kg (101 lb 13.6 oz) 44.2 kg (97 lb 7.1 oz)   Blood pressure 102/63, pulse 100, temperature 97.6 F (36.4 C), temperature source Oral, resp. rate 18, height 5\' 4"  (1.626 m), weight 44.2 kg (97 lb 7.1 oz), SpO2 98.00%.  Intake/Output Summary (Last 24 hours) at 02/28/14 1423 Last data filed at 02/28/14 1055  Gross per 24 hour  Intake      0 ml  Output   2000 ml  Net  -2000 ml     Exam: General: No acute respiratory distress Lungs: Clear to auscultation bilaterally without wheezes or crackles Cardiovascular: Regular rate and rhythm without murmur gallop or rub normal S1 and S2 Abdomen: Nontender, nondistended, soft, bowel  sounds positive, no rebound, no ascites, no appreciable mass Extremities: No significant cyanosis, clubbing. decreased edema  Data Reviewed: Basic Metabolic Panel:  Recent Labs Lab 02/22/14 0617   02/24/14 1200 02/25/14 1120 02/26/14 0735 02/26/14 1340 02/28/14 0709  NA 131*  < > 129* 131* 135* 134* 136*  K 3.6*  < > 3.6* 3.6* 3.9 3.9 3.7  CL 94*  < > 90* 93* 98 96 97  CO2 20  < > 21 23 24 25 29   GLUCOSE 90  < > 98 135* 91 227* 95  BUN 89*  < > 97* 79* 44* 45* 34*  CREATININE 7.71*  < > 7.91* 6.92* 4.69* 4.81* 4.26*  CALCIUM 8.1*  < > 8.3* 8.0* 7.9* 7.8* 7.8*  PHOS 5.4*  --   --   --  3.2 3.2 2.4  < > = values in this interval not displayed. Liver Function Tests:  Recent Labs Lab 02/22/14 0617 02/26/14 1340 02/28/14 0709  ALBUMIN 1.6* 1.5* 1.4*   No results found for this basename: LIPASE, AMYLASE,  in the last 168 hours No results found for this basename: AMMONIA,  in the last 168 hours CBC:  Recent Labs Lab 02/24/14 1200 02/25/14 1120 02/26/14 0735 02/26/14 1340 02/28/14 0709  WBC 6.1 7.1 7.3 8.3 7.8  HGB 9.6* 9.6* 9.7* 9.8* 8.7*  HCT 28.8* 29.2* 29.7* 29.9* 27.4*  MCV 88.6 90.1 90.8 92.0 93.8  PLT 176 145* 118* 123* 105*   Cardiac Enzymes: No results found for this basename: CKTOTAL, CKMB, CKMBINDEX, TROPONINI,  in the last 168 hours BNP (last 3 results) No results found for this basename: PROBNP,  in the last 8760 hours CBG: No results found for this basename: GLUCAP,  in the last 168 hours  Recent Results (from the past 240 hour(s))  MRSA PCR SCREENING     Status: None   Collection Time    02/18/14  6:14 PM      Result Value Ref Range Status   MRSA by PCR NEGATIVE  NEGATIVE Final   Comment:            The GeneXpert MRSA Assay (FDA     approved for NASAL specimens     only), is one component of a     comprehensive MRSA colonization     surveillance program. It is not     intended to diagnose MRSA     infection nor to guide or     monitor treatment for     MRSA infections.  SURGICAL PCR SCREEN     Status: None   Collection Time    02/23/14 11:26 PM      Result Value Ref Range Status   MRSA, PCR NEGATIVE  NEGATIVE Final   Staphylococcus  aureus NEGATIVE  NEGATIVE Final   Comment:            The Xpert SA Assay (FDA     approved for NASAL specimens     in patients over 57 years of age),     is one component of     a comprehensive surveillance     program.  Test performance has     been validated by [employer] for patients greater     than or equal to 64 year old.     It is not intended     to diagnose infection nor to     guide or monitor treatment.     Studies:  Recent  x-ray studies have been reviewed in detail by the Attending Physician  Scheduled Meds:  Scheduled Meds: . amiodarone  100 mg Oral Daily  . amLODipine  5 mg Oral Daily  . calcium acetate  1,334 mg Oral TID WC  . darbepoetin (ARANESP) injection - DIALYSIS  100 mcg Intravenous Q Wed-HD  . doxercalciferol      . doxercalciferol  2 mcg Intravenous Q M,W,F-HD  . feeding supplement (NEPRO CARB STEADY)  237 mL Oral BID BM  . levothyroxine  25 mcg Oral QAC breakfast  . multivitamin  1 tablet Oral QHS  . pantoprazole  20 mg Oral Daily   Continuous Infusions: . sodium chloride Stopped (02/27/14 0700)    Time spent on care of this patient: 68 min   Sheila Oats, MD [place]  [phone] Pager - Text Page per Amion as per below:  On-Call/Text Page:      [email or web address]  If 7PM-7AM, please contact night-coverage [email or web address] 02/28/2014, 2:23 PM   LOS: 10 days

## 2014-02-28 NOTE — Progress Notes (Signed)
Patient ID: [name], female   DOB: [birth year], 80 y.o.   MRN: [id number]   [facility] Progress Note    Assessment/ Plan:   1. End-stage renal disease: Dialysis started for progressive chronic kidney disease and associated changes with mentation likely from uremic encephalopathy-HD today. She is s/p RIJ TDC and R BCF The process has been started for outpatient dialysis unit placement and it appears that she will likely need to be discharged to a skilled nursing facility.  2. GI bleed. EGD negative. Hg stable . Continue Aranesp  3. Anemia a combination of anemia of chronic kidney disease with GI bleed-improved status post packed red cell transfusion and now on Aranesp.  4. Secondary hyperparathyroidism on calcitriol for control of PTH, will add on phosphorus levels to labs from this morning-currently on calcium acetate 1334 mg 3 times a day a.c. for phosphorus binding  5. Hx A Fib, NSR now  Subjective:   Reports to be feeling well and denies any acute events from overnight. Appreciate input from Palliative Care   Objective:   BP 110/50  Pulse 97  Temp(Src) 98.3 F (36.8 C) (Oral)  Resp 16  Ht 5\' 4"  (1.626 m)  Wt 46.75 kg (103 lb 1 oz)  BMI 17.68 kg/m2  SpO2 97%  Intake/Output Summary (Last 24 hours) at 02/28/14 7673 Last data filed at 02/27/14 0800  Gross per 24 hour  Intake      0 ml  Output      0 ml  Net      0 ml   Weight change: -0.75 kg (-1 lb 10.5 oz)  Physical Exam: Gen: Comfortable on HD CVS: Pulse RRR, S1 and S2 with ESM Resp:CTA bilaterally, no rales/rhonchi ALP:FXTK, flat, NT, BS normal Ext:No LE edema  Imaging: No results found.  Labs: BMET  Recent Labs Lab 02/22/14 0617 02/23/14 0626 02/24/14 1200 02/25/14 1120 02/26/14 0735 02/26/14 1340  NA 131* 130* 129* 131* 135* 134*  K 3.6* 3.6* 3.6* 3.6* 3.9 3.9  CL 94* 91* 90* 93* 98 96  CO2 20 20 21 23 24 25   GLUCOSE 90 96 98 135* 91 227*  BUN 89* 97* 97* 79* 44* 45*  CREATININE  7.71* 7.75* 7.91* 6.92* 4.69* 4.81*  CALCIUM 8.1* 8.0* 8.3* 8.0* 7.9* 7.8*  PHOS 5.4*  --   --   --  3.2 3.2   CBC  Recent Labs Lab 02/24/14 1200 02/25/14 1120 02/26/14 0735 02/26/14 1340  WBC 6.1 7.1 7.3 8.3  HGB 9.6* 9.6* 9.7* 9.8*  HCT 28.8* 29.2* 29.7* 29.9*  MCV 88.6 90.1 90.8 92.0  PLT 176 145* 118* 123*   Medications:    . amiodarone  100 mg Oral Daily  . amLODipine  5 mg Oral Daily  . calcium acetate  1,334 mg Oral TID WC  . darbepoetin (ARANESP) injection - DIALYSIS  100 mcg Intravenous Q Wed-HD  . doxercalciferol  2 mcg Intravenous Q M,W,F-HD  . feeding supplement (NEPRO CARB STEADY)  237 mL Oral BID BM  . levothyroxine  25 mcg Oral QAC breakfast  . multivitamin  1 tablet Oral QHS  . pantoprazole  20 mg Oral Daily   Elmarie Shiley, MD 02/28/2014, 7:22 AM

## 2014-02-28 NOTE — Progress Notes (Signed)
Utilization review completed.  

## 2014-02-28 NOTE — Consult Note (Signed)
WOC wound consult note Reason for Consult: evaluation of unstagable pressure ulcer and heels. Pt with dementia but pleasant today. Son at bedside. Pt on air mattress. Very frail in appearance and emaciated.  She has a very prominent coccyx bone since she is so thin.  It is reddened but blanches, she has a healed area proximal to the coccyx bone. She has area that appears to be an area of seborrheic keratosis just distal to her coccyx bone, raised from the skin and mobile, dark in the center.  May be overgrowth at a site of a previous pressure ulcer.  Currently I did not assess any open wounds on her sacrum or coxxyx. She is on an air mattress for offloading.  The heels are blanchable as well. She has protective foam over the heels and over the sacrum since she is such a high risk for skin breakdown (malnutrition, immobile, incontinent) Barrier ointment for her buttocks with each day's bath.  Discussed POC with bedside nurse.  Re consult if needed, will not follow at this time. Thanks  Jaiel Saraceno Kellogg, [place] [phone])

## 2014-02-28 NOTE — Procedures (Signed)
Patient seen on Hemodialysis. QB 400, UF goal 2.5L Treatment adjusted as needed.  Elmarie Shiley MD [facility]. Office # [phone] Pager # [phone] 7:26 AM

## 2014-02-28 NOTE — Progress Notes (Signed)
Physical Therapy Treatment Patient Details Name: [name] MRN: [id number] DOB: 01/23/1926 Today's Date: 02/28/2014 Time: 2800-3491 PT Time Calculation (min): 24 min  PT Assessment / Plan / Recommendation  History of Present Illness Pt with RUE catheter placement and  HD yesterday, now with edema in right forearm, upper arm and neck. Pt currently with IV in left foot   PT Comments   Progressing slowly with weakness evident.  Will need SNF rehab prior to d/c.  Son in room and very involved in pt's care.  Follow Up Recommendations  SNF     Does the patient have the potential to tolerate intense rehabilitation   N/A  Barriers to Discharge  none      Equipment Recommendations  None recommended by PT    Recommendations for Other Services  None  Frequency Min 3X/week   Progress towards PT Goals Progress towards PT goals: Progressing toward goals  Plan Current plan remains appropriate    Precautions / Restrictions Precautions Precautions: Fall   Pertinent Vitals/Pain Min c/o pain with mobility    Mobility  Bed Mobility Overal bed mobility: Needs Assistance Bed Mobility: Supine to Sit Supine to sit: Mod assist;HOB elevated General bed mobility comments: assist for scooting and bringing trunk upright Transfers Equipment used: Rolling walker (2 wheeled) Transfers: Sit to/from Stand Sit to Stand: Min assist General transfer comment: lifting assist from bed Ambulation/Gait Ambulation/Gait assistance: Min assist Ambulation Distance (Feet): 10 Feet Gait Pattern/deviations: Step-to pattern;Shuffle;Trunk flexed;Narrow base of support General Gait Details: limited tolerance, encouragement required to walk; cues for safety backing up and reaching for chair    Exercises General Exercises - Lower Extremity Ankle Circles/Pumps: AROM;Both;10 reps;Seated Short Arc Quad: AROM;Both;10 reps;Seated Straight Leg Raises: AROM;AAROM;Both;10 reps;Seated    PT Goals (current goals can now  be found in the care plan section)    Visit Information  Last PT Received On: 02/28/14 Assistance Needed: +1 History of Present Illness: Pt with RUE catheter placement and  HD yesterday, now with edema in right forearm, upper arm and neck. Pt currently with IV in left foot    Subjective Data      Cognition  Cognition Arousal/Alertness: Awake/alert Behavior During Therapy: WFL for tasks assessed/performed Overall Cognitive Status: History of cognitive impairments - at baseline    Balance  Balance Overall balance assessment: Needs assistance Standing balance support: Bilateral upper extremity supported Standing balance-Leahy Scale: Poor Standing balance comment: flexed trunk and reliant on UE assist for balance  End of Session PT - End of Session Equipment Utilized During Treatment: Gait belt Activity Tolerance: Patient limited by fatigue Patient left: in chair;with call bell/phone within reach   GP     [name] 02/28/2014, 5:00 PM [name], [place] 02/28/2014

## 2014-03-01 NOTE — Clinical Social Work Placement (Addendum)
Clinical Social Work Department CLINICAL SOCIAL WORK PLACEMENT NOTE 03/01/2014  Patient:  [name], [name]  Account Number:  [id number] Admit date:  02/18/2014  Clinical Social Worker:  Carrington Clamp, [place]  Date/time:  03/01/2014 02:34 PM  Clinical Social Work is seeking post-discharge placement for this patient at the following level of care:   [place]   (*CSW will update this form in Epic as items are completed)   03/01/2014  Patient/family provided with [place] Department of Clinical Social Work's list of facilities offering this level of care within the geographic area requested by the patient (or if unable, by the patient's family).  03/01/2014  Patient/family informed of their freedom to choose among providers that offer the needed level of care, that participate in Medicare, Medicaid or managed care program needed by the patient, have an available bed and are willing to accept the patient.  03/01/2014  Patient/family informed of [facility]' ownership interest in [facility], as well as of the fact that they are under no obligation to receive care at this facility.  PASARR submitted to [employer] in July 2009  PASARR number received from [facility] [facility] [facility] in July 2009   [place] transmitted to all facilities in geographic area requested by pt/family on  03/01/2014 FL2 transmitted to all facilities within larger geographic area on   Patient informed that his/her managed care company has contracts with or will negotiate with  certain facilities, including the following:     Patient/family informed of bed offers received: 03/03/14  Patient chooses bed at [facility] Physician recommends and patient chooses bed at    Patient to be transferred to [facility] on 03/04/14   Patient to be transferred to facility by ambulance  The following physician request were entered in Epic:  Additional Comments: 03/04/14: Initial preference was [facility], however they do not have any  private room today and patient nor son wanted a semi-private room. [place] did make a bed offer and has a private room, so patient will discharge to this facility for [place] rehab.    Shelle Iron, LCSW [phone])    Carrington Clamp, [place] Weekend Clinical Social Worker [phone]

## 2014-03-01 NOTE — Progress Notes (Addendum)
[facility] Progress Note    [name] IRW:[id number] DOB: 09/20/1926 DOA: 02/18/2014 PCP: Robyn Haber, MD  Interim summary: Patient is a pleasant 80 year old female with a past medical history of stage IV chronic kidney disease, dementia, atrial fibrillation who was on Coumadin therapy prior to this hospitalization was referred to the emergency room at [facility] by primary care provider on 02/18/2014 for abnormal blood work. Patient having a significant deterioration to kidney function with creatinine at 7. She was also found to have a hemoglobin of 6.2 with guaiac positive stools. INR was also supratherapeutic at 5.9. Patient was transfused with packed red blood cells. She had reported episodes of black tarry stools as of our GI bleed was suspected. Dr. Paulita Fujita of gastroenterology was consulted as she underwent EGD on 02/20/2014. There is no obvious source of bleed noted on procedure. GI recommending holding off on anticoagulation for couple of weeks. Nephrology was consulted regarding acute on chronic renal failure. Lab work did not show improvement to kidney function as nephrology recommended initiating hemodialysis. She had a right brachiocephalic fistula placed along with IJ dialysis catheter on 02/24/2014, initiating hemodialysis.     Subjective: Denies any new c/o, son at bedside  Assessment/Plan: Principal Problem:   Upper GI bleed? - Stable, no clinical evidence of GI bleed - Patient underwent EGD which did not reveal an obvious source of bleed. GI recommended holding anticoagulation for a couple of weeks >> follow up with outpt MDs to weigh risks/benefits and decide on ?further anticoagulation   Active Problems:    PAF- now NSR - stable, continue amiodarone therapy -Anticoagulation stopped for now as above given possible GI bleed and GI recs    Stage IV chronic kidney disease.  -Nephrology consulted on admission -On 02/24/2014 patient underwent right  brachiocephalic fistula as well as right IJ dialysis catheter placement.  -Hemodialysis was started on 02/24/2014 -appreciate renal assistance  HTN -Blood pressures are stable, continue Norvasc 5 mg by mouth daily     Anemia - Was transfused 2 U PRBC, hemoglobin remains stable.    UTI - e coli -Rocephin discontinued-s/p completion of abx course.    Deconditioning -PT recommending SNF, -seen by CIR and declined  Dementia w/o behavioral disturbances -given her comorbidities as listed above, discussed palliative care/Dr Lovena Le- son wants to continue with current care and keep pt full code -appreciate palliative care assistance   Code Status: Full code Family Communication: son at bedside Disposition Plan: To SNF when ok with renal>> awaiting outpatient hemodialysis unit assignment  Consultants: nephrology GI Palliative care  Procedures: EGD-3/12 R. IJ dialysis cath, and brachiocephalic fistula- 7/61  Antibiotics: S/p completion of rocephin   DVT prophylaxis: SCDs  Objective: Filed Weights   02/28/14 0709 02/28/14 1055 02/28/14 2049  Weight: 46.2 kg (101 lb 13.6 oz) 44.2 kg (97 lb 7.1 oz) 44.5 kg (98 lb 1.7 oz)   Blood pressure 120/58, pulse 118, temperature 97.7 F (36.5 C), temperature source Oral, resp. rate 17, height 5\' 4"  (1.626 m), weight 44.5 kg (98 lb 1.7 oz), SpO2 95.00%.  Intake/Output Summary (Last 24 hours) at 03/01/14 1658 Last data filed at 02/28/14 9509  Gross per 24 hour  Intake    240 ml  Output      0 ml  Net    240 ml     Exam: General: No acute respiratory distress Lungs: Clear to auscultation bilaterally without wheezes or crackles Cardiovascular: Regular rate and rhythm without murmur gallop or rub normal S1  and S2 Abdomen: Nontender, nondistended, soft, bowel sounds positive, no rebound, no ascites, no appreciable mass Extremities: No significant cyanosis, clubbing. decreased edema  Data Reviewed: Basic Metabolic  Panel:  Recent Labs Lab 02/24/14 1200 02/25/14 1120 02/26/14 0735 02/26/14 1340 02/28/14 0709  NA 129* 131* 135* 134* 136*  K 3.6* 3.6* 3.9 3.9 3.7  CL 90* 93* 98 96 97  CO2 21 23 24 25 29   GLUCOSE 98 135* 91 227* 95  BUN 97* 79* 44* 45* 34*  CREATININE 7.91* 6.92* 4.69* 4.81* 4.26*  CALCIUM 8.3* 8.0* 7.9* 7.8* 7.8*  PHOS  --   --  3.2 3.2 2.4   Liver Function Tests:  Recent Labs Lab 02/26/14 1340 02/28/14 0709  ALBUMIN 1.5* 1.4*   No results found for this basename: LIPASE, AMYLASE,  in the last 168 hours No results found for this basename: AMMONIA,  in the last 168 hours CBC:  Recent Labs Lab 02/24/14 1200 02/25/14 1120 02/26/14 0735 02/26/14 1340 02/28/14 0709  WBC 6.1 7.1 7.3 8.3 7.8  HGB 9.6* 9.6* 9.7* 9.8* 8.7*  HCT 28.8* 29.2* 29.7* 29.9* 27.4*  MCV 88.6 90.1 90.8 92.0 93.8  PLT 176 145* 118* 123* 105*   Cardiac Enzymes: No results found for this basename: CKTOTAL, CKMB, CKMBINDEX, TROPONINI,  in the last 168 hours BNP (last 3 results) No results found for this basename: PROBNP,  in the last 8760 hours CBG: No results found for this basename: GLUCAP,  in the last 168 hours  Recent Results (from the past 240 hour(s))  SURGICAL PCR SCREEN     Status: None   Collection Time    02/23/14 11:26 PM      Result Value Ref Range Status   MRSA, PCR NEGATIVE  NEGATIVE Final   Staphylococcus aureus NEGATIVE  NEGATIVE Final   Comment:            The Xpert SA Assay (FDA     approved for NASAL specimens     in patients over 55 years of age),     is one component of     a comprehensive surveillance     program.  Test performance has     been validated by [employer] for patients greater     than or equal to 59 year old.     It is not intended     to diagnose infection nor to     guide or monitor treatment.     Studies:  Recent x-ray studies have been reviewed in detail by the Attending Physician  Scheduled Meds:  Scheduled Meds: . amiodarone   100 mg Oral Daily  . amLODipine  5 mg Oral Daily  . calcium acetate  1,334 mg Oral TID WC  . darbepoetin (ARANESP) injection - DIALYSIS  100 mcg Intravenous Q Wed-HD  . doxercalciferol  2 mcg Intravenous Q M,W,F-HD  . feeding supplement (NEPRO CARB STEADY)  237 mL Oral BID BM  . levothyroxine  25 mcg Oral QAC breakfast  . multivitamin  1 tablet Oral QHS  . pantoprazole  20 mg Oral Daily   Continuous Infusions: . sodium chloride Stopped (02/27/14 0700)    Time spent on care of this patient: 2 min   Sheila Oats, MD [place]  [phone] Pager - Text Page per Amion as per below:  On-Call/Text Page:      [email or web address]  If 7PM-7AM, please contact night-coverage [email or web address] 03/01/2014, 4:58 PM   LOS:  11 days

## 2014-03-01 NOTE — Clinical Social Work Psychosocial (Signed)
Clinical Social Work Department BRIEF PSYCHOSOCIAL ASSESSMENT 03/01/2014  Patient:  [name], [name]     Account Number:  [id number]     Admit date:  02/18/2014  Clinical Social Worker:  Hubert Azure  Date/Time:  03/01/2014 01:28 PM  Referred by:  Physician  Date Referred:  03/01/2014 Referred for  SNF Placement   Other Referral:   Interview type:  Patient Other interview type:   Patient was alert at bedside, but preferred for CSW to speak with her son [name]) regarding d/c plan. CSW contacted patient's son [phone]) via telephone.    PSYCHOSOCIAL DATA Living Status:  FAMILY Admitted from facility:   Level of care:   Primary support name:  [name] Primary support relationship to patient:  CHILD, ADULT Degree of support available:   Good. Patient's son has been an active participant in d/c plan.    CURRENT CONCERNS Current Concerns  Post-Acute Placement   Other Concerns:    SOCIAL WORK ASSESSMENT / PLAN Clinical Social Worker (CSW) met with patient who presented as alert and oriented at bedside. CSW introduced self and explained role. Per patient, she was residing at home with her son [name]) prior to hospitalization. Patient stated she is agreeable with d/c plan to SNF and preference is [facility]. Patient then provided consent for CSW to contact her son [name]) to discuss d/c plan. CSW verbalized understanding of the above.  CSW contacted patient's son [name]) who reported d/c planning process is taking up so much of his time and he is in agreement with d/c plan to SNF. Patient's son stated preference was [facility], but understands he and patient may have to choose another facility if there are no beds available at [facility]. CSW verbalized understanding and will continue to be available as needs arise.   Assessment/plan status:  Referral to [employer] Other assessment/ plan:   Will update FL2.   Information/referral to  community resources:   [place].    PATIENT'S/FAMILY'S RESPONSE TO PLAN OF CARE: Patient and son thanked CSW for assistance with d/c plan. Patient's son expressed relief with d/c plan to SNF.   [place], [place] Weekend Clinical Social Worker [phone]

## 2014-03-01 NOTE — Progress Notes (Signed)
Patient ID: SONYA GUNNOE, female   DOB: Jul 29, 1926, 80 y.o.   MRN: [id number]   [facility] Progress Note    Assessment/ Plan:   1. End-stage renal disease: Dialysis started for progressive chronic kidney disease and uremic encephalopathy. Currently on a Monday/Wednesday/Friday dialysis schedule as we await outpatient dialysis unit acceptance/placement. She is s/p RIJ TDC and R BCF. Most likely to be discharged to a skilled nursing facility  2. GI bleed. EGD negative. Hg stable with no overt blood loss . Continue Aranesp  3. Anemia On Aranesp with downtrend of her hemoglobin noted, continue to monitor for transfusion triggers.  4. Secondary hyperparathyroidism on calcitriol for control of PTH, will add on phosphorus levels to labs from this morning-currently on calcium acetate 1334 mg 3 times a day a.c. for phosphorus binding  5. Hx A Fib, NSR now   Subjective:   Reports to be feeling well and denies any chest pain or shortness.    Objective:   BP 117/42  Pulse 63  Temp(Src) 98.3 F (36.8 C) (Oral)  Resp 16  Ht 5\' 4"  (1.626 m)  Wt 44.5 kg (98 lb 1.7 oz)  BMI 16.83 kg/m2  SpO2 95%  Intake/Output Summary (Last 24 hours) at 03/01/14 1007 Last data filed at 02/28/14 1027  Gross per 24 hour  Intake    480 ml  Output   2000 ml  Net  -1520 ml   Weight change: -0.55 kg (-1 lb 3.4 oz)  Physical Exam: Gen: Comfortably resting in bed, CNA by bedside getting ready to feed her breakfast CVS: Pulse regular in rate and rhythm, S1 and S2 with an ejection systolic murmur Resp: Clear to auscultation bilaterally-no rales/rhonchi Abd: Soft, flat, nontender and bowel sounds are normal Ext: No lower extremity edema appreciated  Imaging: No results found.  Labs: BMET  Recent Labs Lab 02/23/14 0626 02/24/14 1200 02/25/14 1120 02/26/14 0735 02/26/14 1340 02/28/14 0709  NA 130* 129* 131* 135* 134* 136*  K 3.6* 3.6* 3.6* 3.9 3.9 3.7  CL 91* 90* 93* 98 96 97  CO2 20 21  23 24 25 29   GLUCOSE 96 98 135* 91 227* 95  BUN 97* 97* 79* 44* 45* 34*  CREATININE 7.75* 7.91* 6.92* 4.69* 4.81* 4.26*  CALCIUM 8.0* 8.3* 8.0* 7.9* 7.8* 7.8*  PHOS  --   --   --  3.2 3.2 2.4   CBC  Recent Labs Lab 02/25/14 1120 02/26/14 0735 02/26/14 1340 02/28/14 0709  WBC 7.1 7.3 8.3 7.8  HGB 9.6* 9.7* 9.8* 8.7*  HCT 29.2* 29.7* 29.9* 27.4*  MCV 90.1 90.8 92.0 93.8  PLT 145* 118* 123* 105*    Medications:    . amiodarone  100 mg Oral Daily  . amLODipine  5 mg Oral Daily  . calcium acetate  1,334 mg Oral TID WC  . darbepoetin (ARANESP) injection - DIALYSIS  100 mcg Intravenous Q Wed-HD  . doxercalciferol  2 mcg Intravenous Q M,W,F-HD  . feeding supplement (NEPRO CARB STEADY)  237 mL Oral BID BM  . levothyroxine  25 mcg Oral QAC breakfast  . multivitamin  1 tablet Oral QHS  . pantoprazole  20 mg Oral Daily   Elmarie Shiley, MD 03/01/2014, 10:07 AM

## 2014-03-02 LAB — BASIC METABOLIC PANEL
BUN: 33 mg/dL — AB (ref 6–23)
CO2: 27 meq/L (ref 19–32)
Calcium: 8.9 mg/dL (ref 8.4–10.5)
Chloride: 95 mEq/L — ABNORMAL LOW (ref 96–112)
Creatinine, Ser: 3.68 mg/dL — ABNORMAL HIGH (ref 0.50–1.10)
GFR calc Af Amer: 12 mL/min — ABNORMAL LOW (ref >90)
GFR, EST NON AFRICAN AMERICAN: 10 mL/min — AB (ref >90)
GLUCOSE: 96 mg/dL (ref 70–99)
Potassium: 4.2 mEq/L (ref 3.7–5.3)
SODIUM: 132 meq/L — AB (ref 137–147)

## 2014-03-02 NOTE — Progress Notes (Addendum)
[facility] Progress Note    [name] JSE:[id number] DOB: 1926/02/07 DOA: 02/18/2014 PCP: Robyn Haber, MD  Interim summary: Patient is a pleasant 80 year old female with a past medical history of stage IV chronic kidney disease, dementia, atrial fibrillation who was on Coumadin therapy prior to this hospitalization was referred to the emergency room at [facility] by primary care provider on 02/18/2014 for abnormal blood work. Patient having a significant deterioration to kidney function with creatinine at 7. She was also found to have a hemoglobin of 6.2 with guaiac positive stools. INR was also supratherapeutic at 5.9. Patient was transfused with packed red blood cells. She had reported episodes of black tarry stools as of our GI bleed was suspected. Dr. Paulita Fujita of gastroenterology was consulted as she underwent EGD on 02/20/2014. There is no obvious source of bleed noted on procedure. GI recommending holding off on anticoagulation for couple of weeks. Nephrology was consulted regarding acute on chronic renal failure. Lab work did not show improvement to kidney function as nephrology recommended initiating hemodialysis. She had a right brachiocephalic fistula placed along with IJ dialysis catheter on 02/24/2014, initiating hemodialysis.     Subjective: Lat R. Arm focal swelling per nsg, pt denies pain   Assessment/Plan: Principal Problem:   Upper GI bleed? - Stable, no clinical evidence of GI bleed - Patient underwent EGD which did not reveal an obvious source of bleed. GI recommended holding anticoagulation for a couple of weeks >> follow up with outpt MDs to weigh risks/benefits and decide on ?further anticoagulation   Active Problems:    PAF- now NSR - stable, continue amiodarone therapy -Anticoagulation stopped for now as above given possible GI bleed and GI recs    Stage IV chronic kidney disease.  -Nephrology consulted on admission -On 02/24/2014 patient  underwent right brachiocephalic fistula as well as right IJ dialysis catheter placement.  -Hemodialysis was started on 02/24/2014 -appreciate renal assistance  HTN -Blood pressures are stable, continue Norvasc 5 mg by mouth daily     Anemia - Was transfused 2 U PRBC, hemoglobin remains stable.    UTI - e coli -Rocephin discontinued-s/p completion of abx course.    Deconditioning -PT recommending SNF, -seen by CIR and declined  Dementia w/o behavioral disturbances -given her comorbidities as listed above, discussed palliative care/Dr Lovena Le- son wants to continue with current care and keep pt full code -appreciate palliative care assistance  ?R arm IVinfiltration -keep elevated, compresses and follow  Code Status: Full code Family Communication: son at bedside Disposition Plan: To SNF when ok with renal>> awaiting outpatient hemodialysis unit assignment  Consultants: nephrology GI Palliative care  Procedures: EGD-3/12 R. IJ dialysis cath, and brachiocephalic fistula- 0/73  Antibiotics: S/p completion of rocephin   DVT prophylaxis: SCDs  Objective: Filed Weights   02/28/14 1055 02/28/14 2049 03/02/14 0500  Weight: 44.2 kg (97 lb 7.1 oz) 44.5 kg (98 lb 1.7 oz) 57.153 kg (126 lb)   Blood pressure 124/48, pulse 66, temperature 97.9 F (36.6 C), temperature source Oral, resp. rate 16, height 5\' 4"  (1.626 m), weight 57.153 kg (126 lb), SpO2 94.00%.  Intake/Output Summary (Last 24 hours) at 03/02/14 1444 Last data filed at 03/01/14 1630  Gross per 24 hour  Intake    100 ml  Output      0 ml  Net    100 ml     Exam: General: No acute respiratory distress Lungs: Clear to auscultation bilaterally without wheezes or crackles Cardiovascular: Regular rate and  rhythm without murmur gallop or rub normal S1 and S2 Abdomen: Nontender, nondistended, soft, bowel sounds positive, no rebound, no ascites, no appreciable mass Extremities: No significant cyanosis, clubbing.  decreased edema  Data Reviewed: Basic Metabolic Panel:  Recent Labs Lab 02/25/14 1120 02/26/14 0735 02/26/14 1340 02/28/14 0709 03/02/14 0538  NA 131* 135* 134* 136* 132*  K 3.6* 3.9 3.9 3.7 4.2  CL 93* 98 96 97 95*  CO2 23 24 25 29 27   GLUCOSE 135* 91 227* 95 96  BUN 79* 44* 45* 34* 33*  CREATININE 6.92* 4.69* 4.81* 4.26* 3.68*  CALCIUM 8.0* 7.9* 7.8* 7.8* 8.9  PHOS  --  3.2 3.2 2.4  --    Liver Function Tests:  Recent Labs Lab 02/26/14 1340 02/28/14 0709  ALBUMIN 1.5* 1.4*   No results found for this basename: LIPASE, AMYLASE,  in the last 168 hours No results found for this basename: AMMONIA,  in the last 168 hours CBC:  Recent Labs Lab 02/24/14 1200 02/25/14 1120 02/26/14 0735 02/26/14 1340 02/28/14 0709  WBC 6.1 7.1 7.3 8.3 7.8  HGB 9.6* 9.6* 9.7* 9.8* 8.7*  HCT 28.8* 29.2* 29.7* 29.9* 27.4*  MCV 88.6 90.1 90.8 92.0 93.8  PLT 176 145* 118* 123* 105*   Cardiac Enzymes: No results found for this basename: CKTOTAL, CKMB, CKMBINDEX, TROPONINI,  in the last 168 hours BNP (last 3 results) No results found for this basename: PROBNP,  in the last 8760 hours CBG: No results found for this basename: GLUCAP,  in the last 168 hours  Recent Results (from the past 240 hour(s))  SURGICAL PCR SCREEN     Status: None   Collection Time    02/23/14 11:26 PM      Result Value Ref Range Status   MRSA, PCR NEGATIVE  NEGATIVE Final   Staphylococcus aureus NEGATIVE  NEGATIVE Final   Comment:            The Xpert SA Assay (FDA     approved for NASAL specimens     in patients over 58 years of age),     is one component of     a comprehensive surveillance     program.  Test performance has     been validated by [employer] for patients greater     than or equal to 35 year old.     It is not intended     to diagnose infection nor to     guide or monitor treatment.     Studies:  Recent x-ray studies have been reviewed in detail by the Attending  Physician  Scheduled Meds:  Scheduled Meds: . amiodarone  100 mg Oral Daily  . amLODipine  5 mg Oral Daily  . calcium acetate  1,334 mg Oral TID WC  . darbepoetin (ARANESP) injection - DIALYSIS  100 mcg Intravenous Q Wed-HD  . doxercalciferol  2 mcg Intravenous Q M,W,F-HD  . feeding supplement (NEPRO CARB STEADY)  237 mL Oral BID BM  . levothyroxine  25 mcg Oral QAC breakfast  . multivitamin  1 tablet Oral QHS  . pantoprazole  20 mg Oral Daily   Continuous Infusions: . sodium chloride Stopped (02/27/14 0700)    Time spent on care of this patient: 50 min   Sheila Oats, MD [place]  [phone] Pager - Text Page per Amion as per below:  On-Call/Text Page:      [email or web address]  If 7PM-7AM, please contact  night-coverage [email or web address] 03/02/2014, 2:44 PM   LOS: 12 days

## 2014-03-02 NOTE — Progress Notes (Signed)
Patient ID: [name], female   DOB: 1926-07-06, 80 y.o.   MRN: [id number]   [facility] Progress Note    Assessment/ Plan:   1. 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 Currently on a Monday/Wednesday/Friday dialysis schedule as we await outpatient dialysis unit acceptance/placement. She is s/p RIJ TDC and R BCF. We'll attempt to dialyze her tomorrow in a recliner to see if she would tolerate outpatient dialysis-she is to be discharged to a skilled nursing facility.  2. GI bleed. EGD negative and no overt blood loss noted. Continue Aranesp  3. Anemia On Aranesp with downtrend of her hemoglobin noted, continue to monitor for transfusion triggers.  4. Secondary hyperparathyroidism on calcitriol for control of PTH, will add on phosphorus levels to labs from this morning-currently on calcium acetate 1334 mg 3 times a day a.c. for phosphorus binding  5. Hx A Fib, NSR now   Subjective:   Reports to be feeling well-denies any focal complaints. Nursing notes reviewed-developing pressure skin changes.    Objective:   BP 107/63  Pulse 77  Temp(Src) 97.2 F (36.2 C) (Oral)  Resp 18  Ht 5\' 4"  (1.626 m)  Wt 57.153 kg (126 lb)  BMI 21.62 kg/m2  SpO2 92%  Intake/Output Summary (Last 24 hours) at 03/02/14 1219 Last data filed at 03/01/14 1630  Gross per 24 hour  Intake    340 ml  Output      0 ml  Net    340 ml   Weight change: 10.953 kg (24 lb 2.4 oz)  Physical Exam: Gen: Comfortably resting in bed, easily engages in conversation CVS: Pulse regular in rate and rhythm, S1 and S2 normal Resp: Coarse bilaterally-no rales/rhonchi Abd: Soft, flat, nontender and bowel sounds are normal Ext: No lower extremity edema  Imaging: No results found.  Labs: BMET  Recent Labs Lab 02/24/14 1200 02/25/14 1120 02/26/14 0735 02/26/14 1340 02/28/14 0709 03/02/14 0538  NA 129* 131* 135* 134* 136* 132*  K 3.6* 3.6* 3.9 3.9  3.7 4.2  CL 90* 93* 98 96 97 95*  CO2 21 23 24 25 29 27   GLUCOSE 98 135* 91 227* 95 96  BUN 97* 79* 44* 45* 34* 33*  CREATININE 7.91* 6.92* 4.69* 4.81* 4.26* 3.68*  CALCIUM 8.3* 8.0* 7.9* 7.8* 7.8* 8.9  PHOS  --   --  3.2 3.2 2.4  --    CBC  Recent Labs Lab 02/25/14 1120 02/26/14 0735 02/26/14 1340 02/28/14 0709  WBC 7.1 7.3 8.3 7.8  HGB 9.6* 9.7* 9.8* 8.7*  HCT 29.2* 29.7* 29.9* 27.4*  MCV 90.1 90.8 92.0 93.8  PLT 145* 118* 123* 105*    Medications:    . amiodarone  100 mg Oral Daily  . amLODipine  5 mg Oral Daily  . calcium acetate  1,334 mg Oral TID WC  . darbepoetin (ARANESP) injection - DIALYSIS  100 mcg Intravenous Q Wed-HD  . doxercalciferol  2 mcg Intravenous Q M,W,F-HD  . feeding supplement (NEPRO CARB STEADY)  237 mL Oral BID BM  . levothyroxine  25 mcg Oral QAC breakfast  . multivitamin  1 tablet Oral QHS  . pantoprazole  20 mg Oral Daily   Elmarie Shiley, MD 03/02/2014, 12:19 PM

## 2014-03-02 NOTE — Progress Notes (Signed)
On skin assessment, red blanchable areas noted bilaterally on heels.  Placed foam prophylactic dressing and elevated heels on pillows.  Sacrum with reddened area noted.  Patient turned q2h, foam dressing placed on sacrum.  Patient has large pitting fluid pocket on right arm.  MD assessed area and ordered warm compresses and elevation of right arm.  Will continue to monitor.

## 2014-03-03 LAB — RENAL FUNCTION PANEL
Albumin: 1.6 g/dL — ABNORMAL LOW (ref 3.5–5.2)
BUN: 49 mg/dL — AB (ref 6–23)
CO2: 26 mEq/L (ref 19–32)
Calcium: 8.9 mg/dL (ref 8.4–10.5)
Chloride: 94 mEq/L — ABNORMAL LOW (ref 96–112)
Creatinine, Ser: 4.86 mg/dL — ABNORMAL HIGH (ref 0.50–1.10)
GFR calc Af Amer: 9 mL/min — ABNORMAL LOW (ref >90)
GFR calc non Af Amer: 7 mL/min — ABNORMAL LOW (ref >90)
Glucose, Bld: 123 mg/dL — ABNORMAL HIGH (ref 70–99)
PHOSPHORUS: 2.8 mg/dL (ref 2.3–4.6)
Potassium: 4.5 mEq/L (ref 3.7–5.3)
Sodium: 133 mEq/L — ABNORMAL LOW (ref 137–147)

## 2014-03-03 LAB — BASIC METABOLIC PANEL
BUN: 49 mg/dL — AB (ref 6–23)
CHLORIDE: 93 meq/L — AB (ref 96–112)
CO2: 25 mEq/L (ref 19–32)
CREATININE: 4.8 mg/dL — AB (ref 0.50–1.10)
Calcium: 9 mg/dL (ref 8.4–10.5)
GFR calc non Af Amer: 7 mL/min — ABNORMAL LOW (ref >90)
GFR, EST AFRICAN AMERICAN: 9 mL/min — AB (ref >90)
GLUCOSE: 122 mg/dL — AB (ref 70–99)
POTASSIUM: 4.4 meq/L (ref 3.7–5.3)
Sodium: 132 mEq/L — ABNORMAL LOW (ref 137–147)

## 2014-03-03 LAB — CBC
HEMATOCRIT: 30.1 % — AB (ref 36.0–46.0)
Hemoglobin: 9.7 g/dL — ABNORMAL LOW (ref 12.0–15.0)
MCH: 30 pg (ref 26.0–34.0)
MCHC: 32.2 g/dL (ref 30.0–36.0)
MCV: 93.2 fL (ref 78.0–100.0)
Platelets: 167 10*3/uL (ref 150–400)
RBC: 3.23 MIL/uL — ABNORMAL LOW (ref 3.87–5.11)
RDW: 20 % — ABNORMAL HIGH (ref 11.5–15.5)
WBC: 9.3 10*3/uL (ref 4.0–10.5)

## 2014-03-03 MED ORDER — HEPARIN SODIUM (PORCINE) 1000 UNIT/ML DIALYSIS
40.0000 [IU]/kg | INTRAMUSCULAR | Status: DC | PRN
  Filled 2014-03-03: qty 3

## 2014-03-03 MED ORDER — CALCIUM ACETATE 667 MG PO CAPS
667.0000 mg | ORAL_CAPSULE | Freq: Three times a day (TID) | ORAL | Status: DC
Start: 2014-03-03 — End: 2014-03-04
  Administered 2014-03-04 (×2): 667 mg via ORAL
  Filled 2014-03-03 (×5): qty 1

## 2014-03-03 MED ORDER — DOXERCALCIFEROL 4 MCG/2ML IV SOLN
INTRAVENOUS | Status: AC
  Filled 2014-03-03: qty 2

## 2014-03-03 NOTE — Progress Notes (Addendum)
[facility] Progress Note    [name] PPJ:[id number] DOB: [birth year] DOA: 02/18/2014 PCP: Robyn Haber, MD  Interim summary: Patient is a pleasant 80 year old female with a past medical history of stage IV chronic kidney disease, dementia, atrial fibrillation who was on Coumadin therapy prior to this hospitalization was referred to the emergency room at [facility] by primary care provider on 02/18/2014 for abnormal blood work. Patient having a significant deterioration to kidney function with creatinine at 7. She was also found to have a hemoglobin of 6.2 with guaiac positive stools. INR was also supratherapeutic at 5.9. Patient was transfused with packed red blood cells. She had reported episodes of black tarry stools as of our GI bleed was suspected. Dr. Paulita Fujita of gastroenterology was consulted as she underwent EGD on 02/20/2014. There is no obvious source of bleed noted on procedure. GI recommending holding off on anticoagulation for couple of weeks. Nephrology was consulted regarding acute on chronic renal failure. Lab work did not show improvement to kidney function as nephrology recommended initiating hemodialysis. She had a right brachiocephalic fistula placed along with IJ dialysis catheter on 02/24/2014, initiating hemodialysis.     Subjective: Denies new c/o.   Assessment/Plan: Principal Problem:   Upper GI bleed? - Stable, no clinical evidence of GI bleed - Patient underwent EGD which did not reveal an obvious source of bleed. GI recommended holding anticoagulation for a couple of weeks >> follow up with outpt MDs to weigh risks/benefits and decide on ?further anticoagulation   Active Problems:    PAF- now NSR -stable, continue amiodarone therapy -Anticoagulation stopped for now as above given possible GI bleed and GI recs    Stage IV chronic kidney disease.  -Nephrology consulted on admission -On 02/24/2014 patient underwent right brachiocephalic fistula  as well as right IJ dialysis catheter placement.  -Hemodialysis was started on 02/24/2014 -appreciate renal assistance, still awaiting outpt dialysis unit  HTN -Blood pressures are stable, continue Norvasc 5 mg by mouth daily     Anemia - Was transfused 2 U PRBC, hemoglobin remains stable.    UTI - e coli -Rocephin discontinued-s/p completion of abx course.    Deconditioning -PT recommending SNF, -seen by CIR and declined  Dementia w/o behavioral disturbances -given her comorbidities as listed above, discussed palliative care/Dr Lovena Le- son wants to continue with current care and keep pt full code -appreciate palliative care assistance  ?R arm IVinfiltration -keep elevated, compresses and follow  Code Status: Full code Family Communication: son at bedside Disposition Plan: To SNF when ok with renal>> awaiting outpatient hemodialysis unit assignment  Consultants: nephrology GI Palliative care  Procedures: EGD-3/12 R. IJ dialysis cath, and brachiocephalic fistula- 5/80  Antibiotics: S/p completion of rocephin   DVT prophylaxis: SCDs  Objective: Filed Weights   03/02/14 0500 03/03/14 1206 03/03/14 1628  Weight: 57.153 kg (126 lb) 44.7 kg (98 lb 8.7 oz) 44.1 kg (97 lb 3.6 oz)   Blood pressure 118/52, pulse 88, temperature 97.2 F (36.2 C), temperature source Oral, resp. rate 16, height 5\' 4"  (1.626 m), weight 44.1 kg (97 lb 3.6 oz), SpO2 91.00%.  Intake/Output Summary (Last 24 hours) at 03/03/14 1743 Last data filed at 03/03/14 1628  Gross per 24 hour  Intake    100 ml  Output    792 ml  Net   -692 ml     Exam: General: No acute respiratory distress Lungs: Clear to auscultation bilaterally without wheezes or crackles Cardiovascular: Regular rate and rhythm without murmur gallop  or rub normal S1 and S2 Abdomen: Nontender, nondistended, soft, bowel sounds positive, no rebound, no ascites, no appreciable mass Extremities: No significant cyanosis, clubbing.  decreased edema  Data Reviewed: Basic Metabolic Panel:  Recent Labs Lab 02/26/14 0735 02/26/14 1340 02/28/14 0709 03/02/14 0538 03/03/14 1215 03/03/14 1230  NA 135* 134* 136* 132* 133* 132*  K 3.9 3.9 3.7 4.2 4.5 4.4  CL 98 96 97 95* 94* 93*  CO2 24 25 29 27 26 25   GLUCOSE 91 227* 95 96 123* 122*  BUN 44* 45* 34* 33* 49* 49*  CREATININE 4.69* 4.81* 4.26* 3.68* 4.86* 4.80*  CALCIUM 7.9* 7.8* 7.8* 8.9 8.9 9.0  PHOS 3.2 3.2 2.4  --  2.8  --    Liver Function Tests:  Recent Labs Lab 02/26/14 1340 02/28/14 0709 03/03/14 1215  ALBUMIN 1.5* 1.4* 1.6*   No results found for this basename: LIPASE, AMYLASE,  in the last 168 hours No results found for this basename: AMMONIA,  in the last 168 hours CBC:  Recent Labs Lab 02/25/14 1120 02/26/14 0735 02/26/14 1340 02/28/14 0709 03/03/14 1215  WBC 7.1 7.3 8.3 7.8 9.3  HGB 9.6* 9.7* 9.8* 8.7* 9.7*  HCT 29.2* 29.7* 29.9* 27.4* 30.1*  MCV 90.1 90.8 92.0 93.8 93.2  PLT 145* 118* 123* 105* 167   Cardiac Enzymes: No results found for this basename: CKTOTAL, CKMB, CKMBINDEX, TROPONINI,  in the last 168 hours BNP (last 3 results) No results found for this basename: PROBNP,  in the last 8760 hours CBG: No results found for this basename: GLUCAP,  in the last 168 hours  Recent Results (from the past 240 hour(s))  SURGICAL PCR SCREEN     Status: None   Collection Time    02/23/14 11:26 PM      Result Value Ref Range Status   MRSA, PCR NEGATIVE  NEGATIVE Final   Staphylococcus aureus NEGATIVE  NEGATIVE Final   Comment:            The Xpert SA Assay (FDA     approved for NASAL specimens     in patients over 76 years of age),     is one component of     a comprehensive surveillance     program.  Test performance has     been validated by [employer] for patients greater     than or equal to 20 year old.     It is not intended     to diagnose infection nor to     guide or monitor treatment.     Studies:  Recent  x-ray studies have been reviewed in detail by the Attending Physician  Scheduled Meds:  Scheduled Meds: . amiodarone  100 mg Oral Daily  . calcium acetate  667 mg Oral TID WC  . darbepoetin (ARANESP) injection - DIALYSIS  100 mcg Intravenous Q Wed-HD  . doxercalciferol      . doxercalciferol  2 mcg Intravenous Q M,W,F-HD  . feeding supplement (NEPRO CARB STEADY)  237 mL Oral BID BM  . levothyroxine  25 mcg Oral QAC breakfast  . multivitamin  1 tablet Oral QHS  . pantoprazole  20 mg Oral Daily   Continuous Infusions: . sodium chloride Stopped (02/27/14 0700)    Time spent on care of this patient: 101 min   Sheila Oats, MD [place]  [phone] Pager - Text Page per Shea Evans as per below:  On-Call/Text Page:  [email or web address]  If 7PM-7AM, please contact night-coverage [email or web address] 03/03/2014, 5:43 PM   LOS: 13 days

## 2014-03-03 NOTE — Progress Notes (Signed)
03/03/2014 4:51 PM Hemodialysis Outpatient Note; this patient has been accepted at the [facility] on a Tuesday Thursday and Saturday 2nd shift schedule. The patient can begin treatment on Thursday March 26,2015 at 11:30. Thank you. Gordy Savers

## 2014-03-03 NOTE — Progress Notes (Signed)
Patient ID: [name], female   DOB: 29-Jun-1926, 80 y.o.   MRN: [id number]   [facility] Progress Note    Assessment/ Plan:   1. 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 Currently on a Monday/Wednesday/Friday dialysis schedule as we await outpatient dialysis unit acceptance/placement. She is s/p RIJ TDC and R BCF. She is tolerating HD today in a recliner. she is to be discharged to a skilled nursing facility.  2. GI bleed. EGD negative and no overt blood loss noted. Continue Aranesp  3. Anemia On Aranesp with downtrend of her hemoglobin noted, continue to monitor for transfusion triggers.  4. Secondary hyperparathyroidism on calcitriol for control of PTH, will add on phosphorus levels to labs from this morning-currently on calcium acetate 1334 mg 3 times a day a.c. for phosphorus binding - will decrease to one phoslo with meals for lowish phos 5. Hx A Fib, NSR now 6. HTN/vol- does not seem overloaded- BP low, will stop norvasc    Subjective:   Seen on dialysis - very flat... Disposition in the works   Objective:   BP 104/51  Pulse 104  Temp(Src) 97.6 F (36.4 C) (Oral)  Resp 16  Ht 5\' 4"  (1.626 m)  Wt 44.7 kg (98 lb 8.7 oz)  BMI 16.91 kg/m2  SpO2 92%  Intake/Output Summary (Last 24 hours) at 03/03/14 1427 Last data filed at 03/03/14 0900  Gross per 24 hour  Intake    100 ml  Output      0 ml  Net    100 ml   Weight change:   Physical Exam: Gen: Comfortably resting in bed, easily engages in conversation right sided PC  CVS: Pulse regular in rate and rhythm, S1 and S2 normal Resp: Coarse bilaterally-no rales/rhonchi Abd: Soft, flat, nontender and bowel sounds are normal Ext: No lower extremity edema- right upper arm AVF- thin skin  Imaging: No results found.  Labs: BMET  Recent Labs Lab 02/25/14 1120 02/26/14 0735 02/26/14 1340 02/28/14 0709 03/02/14 0538 03/03/14 1215 03/03/14 1230  NA 131*  135* 134* 136* 132* 133* 132*  K 3.6* 3.9 3.9 3.7 4.2 4.5 4.4  CL 93* 98 96 97 95* 94* 93*  CO2 23 24 25 29 27 26 25   GLUCOSE 135* 91 227* 95 96 123* 122*  BUN 79* 44* 45* 34* 33* 49* 49*  CREATININE 6.92* 4.69* 4.81* 4.26* 3.68* 4.86* 4.80*  CALCIUM 8.0* 7.9* 7.8* 7.8* 8.9 8.9 9.0  PHOS  --  3.2 3.2 2.4  --  2.8  --    CBC  Recent Labs Lab 02/26/14 0735 02/26/14 1340 02/28/14 0709 03/03/14 1215  WBC 7.3 8.3 7.8 9.3  HGB 9.7* 9.8* 8.7* 9.7*  HCT 29.7* 29.9* 27.4* 30.1*  MCV 90.8 92.0 93.8 93.2  PLT 118* 123* 105* 167    Medications:    . amiodarone  100 mg Oral Daily  . amLODipine  5 mg Oral Daily  . calcium acetate  1,334 mg Oral TID WC  . darbepoetin (ARANESP) injection - DIALYSIS  100 mcg Intravenous Q Wed-HD  . doxercalciferol  2 mcg Intravenous Q M,W,F-HD  . feeding supplement (NEPRO CARB STEADY)  237 mL Oral BID BM  . levothyroxine  25 mcg Oral QAC breakfast  . multivitamin  1 tablet Oral QHS  . pantoprazole  20 mg Oral Daily   Elmarie Shiley, MD 03/03/2014, 2:27 PM

## 2014-03-03 NOTE — Progress Notes (Signed)
Utilization review completed.  

## 2014-03-03 NOTE — Progress Notes (Signed)
PT Cancellation Note  Patient Details Name: [name] MRN: [id number] DOB: Apr 17, 1926   Cancelled Treatment:    Reason Eval/Treat Not Completed: Patient at procedure or test/unavailable (pt in HD and unavailable)   Melford Aase 03/03/2014, 1:18 PM Elwyn Reach, [place]

## 2014-03-03 NOTE — Procedures (Signed)
Patient was seen on dialysis and the procedure was supervised.  BFR 400   Via PC BP is  104/60.   Patient appears to be tolerating treatment well  [name] A 03/03/2014

## 2014-03-03 NOTE — Progress Notes (Signed)
Patient HC:SPZZ [name]      DOB: [birth year]      CKI:[id number]  Shadowing chart with you son was clear he desires full code and full care.  [name], MD MBA The Palliative Medicine Team at [facility] Phone: [phone] Pager: [phone]

## 2014-03-04 DIAGNOSIS — N390 Urinary tract infection, site not specified: Secondary | ICD-10-CM

## 2014-03-04 MED ORDER — PANTOPRAZOLE SODIUM 20 MG PO TBEC: 20.0000 mg | DELAYED_RELEASE_TABLET | Freq: Every day | ORAL | Status: DC

## 2014-03-04 MED ORDER — ACETAMINOPHEN-CODEINE #3 300-30 MG PO TABS: 1.0000 | ORAL_TABLET | ORAL | Status: DC | PRN

## 2014-03-04 MED ORDER — NEPRO/CARBSTEADY PO LIQD: 237.0000 mL | Freq: Two times a day (BID) | ORAL | Status: DC

## 2014-03-04 MED ORDER — CALCIUM ACETATE 667 MG PO CAPS: 667.0000 mg | ORAL_CAPSULE | Freq: Three times a day (TID) | ORAL | Status: DC

## 2014-03-04 MED ORDER — RENA-VITE PO TABS: 1.0000 | ORAL_TABLET | Freq: Every day | ORAL | Status: DC

## 2014-03-04 MED ORDER — BOOST / RESOURCE BREEZE PO LIQD
1.0000 | Freq: Two times a day (BID) | ORAL | Status: DC
  Administered 2014-03-04: 1 via ORAL

## 2014-03-04 NOTE — Progress Notes (Signed)
NUTRITION FOLLOW UP  DOCUMENTATION CODES  Per approved criteria   -Severe malnutrition in the context of chronic illness  -Underweight    Pt meets criteria for severe MALNUTRITION in the context of chronic illness as evidenced by severe fat and muscle wasting.  Intervention:   Continue Nepro Shake po BID, each supplement provides 425 kcal and 19 grams protein.  Provide Resource Breeze po BID with meals, each supplement provides 250 kcals and 9 grams of protein.  Recommend medication for appetite stimulant.  Nutrition Dx:   Inadequate oral intake related to chronic illness as evidenced by reported wt loss; Ongoing  Goal:   Pt to meet >/= 90% of their estimated nutrition needs. Unmet.  Monitor:   Wt trends, I/O's, labs, po intake   Assessment:   80 y.o. female with PMH significant for stroke, stomach cancer, PUD, A fib on coumadin, CKD stage 3, who was referred to the ED for further evaluation by Dr Deterding due to worsening renal function (Cr at 7).   Pt underwent endoscopy on 3/12 for dark stools and concern for possible GI bleed.   Renal labs not showing any improvement. Underwent permacath placement on 3/16, CLIP process started. HD started 3/16.   BSE completed by SLP on 3/13 with recommendations for Dysphagia 3 with thin liquids. Pt is eating fair, discussed with on at bedside. Pt will consume Nepro Shakes. Pt not a candidate for CIR per case management note.  3/24-Pt reports not having an appetite. Meal completion 25%. Pt reports she is unsure of why she has no appetite. Pt was encouraged to eat and drink her supplements to ensure adequate calories in protein in her diet. Pt verbalized understanding. Pt reports her son usually makes her eat and drink her supplements when he is at bedside. Son was not at bedside during visit. Pt is willing to try Resource Breeze as she does enjoy fruit juices--will order. Pt denies any stomach pains, nausea, or difficulties chewing and  swallowing.  Renal labs:Low sodium. Potassium and phosphorous WNL.  Labs: Low chloride and GFR. High glucose, BUN, and creatinine.  Height: Ht Readings from Last 1 Encounters:  02/28/14 5\' 4"  (1.626 m)    Weight Status:   Wt Readings from Last 1 Encounters:  03/03/14 97 lb 10.6 oz (44.3 kg)  Admit wt (02/18/14) 91lb Weight up since admission.  Body mass index is 16.76 kg/(m^2). Underweight  Re-estimated needs:  Kcal: 1400-1600  Protein: 70-80 g  Fluid: >1.6 L/day  Skin: unstageable full thickness wound to mid coccyx  Stage II to upper coccyx  Diet Order: Renal   Intake/Output Summary (Last 24 hours) at 03/04/14 1227 Last data filed at 03/04/14 0844  Gross per 24 hour  Intake    240 ml  Output    792 ml  Net   -552 ml    Last BM: 3/19   Labs:   Recent Labs Lab 02/26/14 1340 02/28/14 0709 03/02/14 0538 03/03/14 1215 03/03/14 1230  NA 134* 136* 132* 133* 132*  K 3.9 3.7 4.2 4.5 4.4  CL 96 97 95* 94* 93*  CO2 25 29 27 26 25   BUN 45* 34* 33* 49* 49*  CREATININE 4.81* 4.26* 3.68* 4.86* 4.80*  CALCIUM 7.8* 7.8* 8.9 8.9 9.0  PHOS 3.2 2.4  --  2.8  --   GLUCOSE 227* 95 96 123* 122*    CBG (last 3)  No results found for this basename: GLUCAP,  in the last 72 hours  Scheduled Meds: .  amiodarone  100 mg Oral Daily  . calcium acetate  667 mg Oral TID WC  . darbepoetin (ARANESP) injection - DIALYSIS  100 mcg Intravenous Q Wed-HD  . doxercalciferol  2 mcg Intravenous Q M,W,F-HD  . feeding supplement (NEPRO CARB STEADY)  237 mL Oral BID BM  . levothyroxine  25 mcg Oral QAC breakfast  . multivitamin  1 tablet Oral QHS  . pantoprazole  20 mg Oral Daily    Continuous Infusions: . sodium chloride Stopped (02/27/14 0700)    Kallie Locks Dietetic Intern Pager: [phone]

## 2014-03-04 NOTE — Progress Notes (Signed)
Patient ID: [name], female   DOB: 1925/12/23, 80 y.o.   MRN: [id number]   [facility] Progress Note    Assessment/ Plan:   1. 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 Currently on a MWF schedule here- done Monday, will be TTS as OP at [facility]- I think pt can wait until Thursday for her next treatment and she will get 3 tx this week- M/Thurs/Sat.  She is s/p RIJ TDC and R BCF. She tolerated HD  in a recliner. she is to be discharged to a skilled nursing facility.  2. GI bleed. EGD negative and no overt blood loss noted. Continue Aranesp  3. Anemia On Aranesp with downtrend of her hemoglobin noted, continue to monitor for transfusion triggers. Iron stores good 4. Secondary hyperparathyroidism on hectorol for control of PTH,  decreased to one phoslo with meals for lowish phos 5. Hx A Fib, NSR now 6. HTN/vol- does not seem overloaded- BP low, have stoppped norvasc    Subjective:   Seen - very flat... Disposition in the works, there is a discharge summary in the chart and her OP dialysis is set up for Thursday   Objective:   BP 105/55  Pulse 116  Temp(Src) 97.7 F (36.5 C) (Oral)  Resp 18  Ht 5\' 4"  (1.626 m)  Wt 44.3 kg (97 lb 10.6 oz)  BMI 16.76 kg/m2  SpO2 96%  Intake/Output Summary (Last 24 hours) at 03/04/14 1204 Last data filed at 03/04/14 0844  Gross per 24 hour  Intake    240 ml  Output    792 ml  Net   -552 ml   Weight change:   Physical Exam: Gen: Comfortably resting in bed, easily engages in conversation right sided PC  CVS: Pulse regular in rate and rhythm, S1 and S2 normal Resp: Coarse bilaterally-no rales/rhonchi Abd: Soft, flat, nontender and bowel sounds are normal Ext: No lower extremity edema- right upper arm AVF- thin skin  Imaging: No results found.  Labs: BMET  Recent Labs Lab 02/26/14 0735 02/26/14 1340 02/28/14 0709 03/02/14 0538 03/03/14 1215 03/03/14 1230  NA 135* 134*  136* 132* 133* 132*  K 3.9 3.9 3.7 4.2 4.5 4.4  CL 98 96 97 95* 94* 93*  CO2 24 25 29 27 26 25   GLUCOSE 91 227* 95 96 123* 122*  BUN 44* 45* 34* 33* 49* 49*  CREATININE 4.69* 4.81* 4.26* 3.68* 4.86* 4.80*  CALCIUM 7.9* 7.8* 7.8* 8.9 8.9 9.0  PHOS 3.2 3.2 2.4  --  2.8  --    CBC  Recent Labs Lab 02/26/14 0735 02/26/14 1340 02/28/14 0709 03/03/14 1215  WBC 7.3 8.3 7.8 9.3  HGB 9.7* 9.8* 8.7* 9.7*  HCT 29.7* 29.9* 27.4* 30.1*  MCV 90.8 92.0 93.8 93.2  PLT 118* 123* 105* 167    Medications:    . amiodarone  100 mg Oral Daily  . calcium acetate  667 mg Oral TID WC  . darbepoetin (ARANESP) injection - DIALYSIS  100 mcg Intravenous Q Wed-HD  . doxercalciferol  2 mcg Intravenous Q M,W,F-HD  . feeding supplement (NEPRO CARB STEADY)  237 mL Oral BID BM  . levothyroxine  25 mcg Oral QAC breakfast  . multivitamin  1 tablet Oral QHS  . pantoprazole  20 mg Oral Daily   [name] A   03/04/2014, 12:04 PM

## 2014-03-04 NOTE — Discharge Summary (Signed)
Physician Discharge Summary  [name] PFX:[id number] DOB: 1926-05-12 DOA: 02/18/2014  PCP: Robyn Haber, MD  Admit date: 02/18/2014 Discharge date: 03/04/2014  Time spent: Greater than 30  minutes  Recommendations for Outpatient Follow-up:  Follow-up Information   Follow up with Robyn Haber, MD.   Specialty:  [facility]   Contact information:   [place] [address] [phone]       Please follow up. (SNF MDin 1-2DAYS)       Follow up with TILLEY JR,W SPENCER, MD. (in 1week, call for appt upon discharge)    Specialty:  Cardiology   Contact information:   [address] [place] [address] [phone]        Discharge Diagnoses:  Principal Problem:   Upper GI bleed Active Problems:   Hypertension   Atrial fibrillation   Acute on chronic renal failure   Anemia   UTI (lower urinary tract infection)   Discharge Condition: Improved/stable  Diet recommendation: Renal diet   Filed Weights   03/03/14 1206 03/03/14 1628 03/03/14 2123  Weight: 44.7 kg (98 lb 8.7 oz) 44.1 kg (97 lb 3.6 oz) 44.3 kg (97 lb 10.6 oz)    History of present illness:  [name] is a 80 y.o. female with PMH significant for stroke, stomach cancer, PUD, A fib on coumadin, CKD stage 3 last Cr per our record at 2.16, who was refer to the ED for further evaluation by Dr Deterdine due to worsening renal function ( cr at 7). In the ED patient was found to have hb at 6.2. Guaiac stool positive, INR at 5.9. On further questioning patient relates melena, once a day for last few weeks. She denies nausea, vomiting, abdominal pain, diarrhea, dysuria. She was notice to be more confuse, weak and lethargic by son.  Patient is alert, she is answering questions, she was oriented to place, time. She recognized her son. She Denied taking ibuprofen, or aspirin. She was admitted for further evaluation and management.   Hospital Course:  Patient is a pleasant  80 year old female with a past medical history of stage IV chronic kidney disease, dementia, atrial fibrillation who was on Coumadin therapy prior to this hospitalization was referred to the emergency room at [facility] by primary care provider on 02/18/2014 for abnormal blood work. Patient having a significant deterioration to kidney function with creatinine at 7. She was also found to have a hemoglobin of 6.2 with guaiac positive stools. INR was also supratherapeutic at 5.9. Patient was transfused with packed red blood cells. She had reported episodes of black tarry stools as of our GI bleed was suspected. Dr. Paulita Fujita of gastroenterology was consulted as she underwent EGD on 02/20/2014. There is no obvious source of bleed noted on procedure. GI recommending holding off on anticoagulation for couple of weeks. Nephrology was consulted regarding acute on chronic renal failure. Lab work did not show improvement to kidney function as nephrology recommended initiating hemodialysis. She had a right brachiocephalic fistula placed along with IJ dialysis catheter on 02/24/2014, initiating hemodialysis.  Principal Problem:  Upper GI bleed?  -As discussed above she presented with melena and arm INR was supratherapeutic at 5.9 with a hemoglobin of 6.2 in her stools were guaiac-positive. -GI was consulted and saw patient  - Patient underwent EGD which did not reveal an obvious source of bleed. GI recommended holding anticoagulation for a couple of weeks >> and then she is follow up with outpt MDs-cardiology, Dr. Wynonia Lawman to  weigh risks/benefits and decide on ?further anticoagulation  - She has remained Stable, no clinical evidence of GI bleed and her last hemoglobin prior to discharge her 9.7. -She is to followup with [facility] outpatient as needed Active Problems:  PAF- now NSR  -stable, continue amiodarone therapy  -Anticoagulation stopped for now as above given above GI bleed and GI recs  Stage IV chronic  kidney disease/ESRD.  -Nephrology consulted on admission  -On 02/24/2014 patient underwent right brachiocephalic fistula as well as right IJ dialysis catheter placement.  -Hemodialysis was started on 02/24/2014  -She was then established as a stage renal disease and went through process of placement and outpatient dialysis unit>> she is to follow up there Thursday to receive dialysis>> TTS  HTN  -Patient had been on Norvasc for hypertension but for dialysis her blood pressures were running low/borderline and so this was discontinued. Anemia  - Was transfused 2 U PRBC, hemoglobin remains stable.  UTI - e coli  -Rocephin discontinued-s/p completion of abx course.  Deconditioning  -PT recommending SNF,  -seen by CIR and declined  Dementia w/o behavioral disturbances  -given her comorbidities as listed above, discussed palliative care/Dr Lovena Le- son wants to continue with current care and keep pt full code  -appreciate palliative care assistance  ?R arm IVinfiltration  -keep elevated, improved with compresses and follow up outpatient Protein calorie malnutrition -She was placed on nutritional supplements this hospital stay and is to continue Nepro supplements upon discharge Consultants:  nephrology  GI  Palliative care  Procedures:  EGD-3/12  R. IJ dialysis cath, and brachiocephalic fistula- 2/11    Discharge Exam: Filed Vitals:   03/04/14 0844  BP: 105/55  Pulse: 116  Temp: 97.7 F (36.5 C)  Resp: 18   Exam:  General: No acute respiratory distress  Lungs: Clear to auscultation bilaterally without wheezes or crackles  Cardiovascular: Regular rate and rhythm without murmur gallop or rub normal S1 and S2  Abdomen: Nontender, nondistended, soft, bowel sounds positive, no rebound, no ascites, no appreciable mass  Extremities: No significant cyanosis, clubbing. decreased edema  Discharge Instructions  Discharge Orders   Future Appointments Provider Department Dept Phone    04/01/2014 1:00 PM Stacy Gardner, PA-C [facility] [place] [phone]   06/19/2014 3:40 PM Liliane Shi, PA-C [place] [facility] [phone]   07/21/2014 2:00 PM Philmore Pali, NP [facility] [phone]   Future Orders Complete By Expires   Diet renal 60/70-01-13-1199  As directed    Increase activity slowly  As directed        Medication List    STOP taking these medications       calcitRIOL 0.25 MCG capsule  Commonly known as:  ROCALTROL     furosemide 20 MG tablet  Commonly known as:  LASIX     multivitamin capsule     sodium bicarbonate 650 MG tablet     warfarin 5 MG tablet  Commonly known as:  COUMADIN      TAKE these medications       acetaminophen-codeine 300-30 MG per tablet  Commonly known as:  TYLENOL #3  Take 1 tablet by mouth every 4 (four) hours as needed for moderate pain.     amiodarone 200 MG tablet  Commonly known as:  PACERONE  Take 100 mg by mouth daily.     atorvastatin 40 MG tablet  Commonly known as:  LIPITOR  Take 40 mg by mouth daily at 6 PM.  calcium acetate 667 MG capsule  Commonly known as:  PHOSLO  Take 1 capsule (667 mg total) by mouth 3 (three) times daily with meals.     feeding supplement (NEPRO CARB STEADY) Liqd  Take 237 mLs by mouth 2 (two) times daily between meals.     IRON PO  Take 1 capsule twice daily (ferrous)     levothyroxine 25 MCG tablet  Commonly known as:  SYNTHROID  Take 1 tablet (25 mcg total) by mouth daily before breakfast.     multivitamin Tabs tablet  Take 1 tablet by mouth at bedtime.     pantoprazole 20 MG tablet  Commonly known as:  PROTONIX  Take 1 tablet (20 mg total) by mouth daily.     VITAMIN B 12 PO  Take 1 capsule by mouth 3 (three) times a week.       No Known Allergies     Follow-up Information   Follow up with Robyn Haber, MD.   Specialty:  Family Medicine   Contact information:   [place]  [address] [phone]       Please follow up. (SNF MDin 1-2DAYS)       Follow up with TILLEY JR,W SPENCER, MD. (in 1week, call for appt upon discharge)    Specialty:  Cardiology   Contact information:   [place] [address] [phone]        The results of significant diagnostics from this hospitalization (including imaging, microbiology, ancillary and laboratory) are listed below for reference.    Significant Diagnostic Studies: US Renal  02/18/2014   CLINICAL DATA Acute on chronic renal failure, history hypertension  EXAM RENAL/URINARY TRACT ULTRASOUND COMPLETE  COMPARISON CT abdomen and pelvis 05/11/2011  FINDINGS Right Kidney:  Length: 8.2 cm. Minimal cortical thinning. Significantly increased cortical echogenicity. Large shadowing calculi within right kidney largest 10 mm diameter inferior pole. No definite mass or hydronephrosis.  Left Kidney:  Length: 8.8 cm. Normal cortical thickness. Significantly increased cortical echogenicity. No mass, hydronephrosis or shadowing calcification.  Bladder:  Wall appears thickened up to 12 mm thick though this may be in part related to the presence of minimal urine and poor distention. No gross mass lesion.  IMPRESSION Medical renal disease changes without evidence of mass or hydronephrosis.  Nonobstructing right renal calculi to 10 mm diameter.  SIGNATURE  Electronically Signed   By: Lavonia Dana M.D.   On: 02/18/2014 16:59   Dg Chest Port 1 View  02/24/2014   CLINICAL DATA:  Status post dialysis catheter placement  EXAM: PORTABLE CHEST - 1 VIEW  COMPARISON:  02/18/2014  FINDINGS: Cardiac shadow is stable. The lungs are well aerated. Small pleural effusions are noted bilaterally. A new dialysis catheter is seen with the catheter tip at the cavoatrial junction in satisfactory position. No pneumothorax is noted. No acute bony abnormality is noted. Postsurgical changes are noted in the abdomen.  IMPRESSION: No pneumothorax  is noted following catheter placement. Small pleural effusions are noted bilaterally.  These results were called by telephone at the time of interpretation on 02/24/2014 at 10:21 AM to [facility] the patient's nurse in the PACU, who verbally acknowledged these results.   Electronically Signed   By: Inez Catalina M.D.   On: 02/24/2014 10:22   Dg Chest Port 1 View  02/18/2014   CLINICAL DATA:  Fluid overload.  Abnormal renal labs.  EXAM: PORTABLE CHEST - 1 VIEW  COMPARISON:  DG CHEST 1V PORT dated  08/31/2013; CT CHEST W/O CM dated 03/15/2013  FINDINGS: Cardiopericardial silhouette within normal limits. Mediastinal contours normal. Trachea midline. No airspace disease or effusion. Monitoring leads project over the chest. Bilateral glenohumeral osteoarthritis is present with probable right rotator cuff tear.  IMPRESSION: No active cardiopulmonary disease.   Electronically Signed   By: Dereck Ligas M.D.   On: 02/18/2014 12:34   Dg Fluoro Guide Cv Line-no Report  02/24/2014   CLINICAL DATA: hemodialysis   FLOURO GUIDE CV LINE  Fluoroscopy was utilized by the requesting physician.  No radiographic  interpretation.     Microbiology: Recent Results (from the past 240 hour(s))  SURGICAL PCR SCREEN     Status: None   Collection Time    02/23/14 11:26 PM      Result Value Ref Range Status   MRSA, PCR NEGATIVE  NEGATIVE Final   Staphylococcus aureus NEGATIVE  NEGATIVE Final   Comment:            The Xpert SA Assay (FDA     approved for NASAL specimens     in patients over 27 years of age),     is one component of     a comprehensive surveillance     program.  Test performance has     been validated by [employer] for patients greater     than or equal to 37 year old.     It is not intended     to diagnose infection nor to     guide or monitor treatment.     Labs: Basic Metabolic Panel:  Recent Labs Lab 02/26/14 0735 02/26/14 1340 02/28/14 0709 03/02/14 0538 03/03/14 1215 03/03/14 1230  NA  135* 134* 136* 132* 133* 132*  K 3.9 3.9 3.7 4.2 4.5 4.4  CL 98 96 97 95* 94* 93*  CO2 24 25 29 27 26 25   GLUCOSE 91 227* 95 96 123* 122*  BUN 44* 45* 34* 33* 49* 49*  CREATININE 4.69* 4.81* 4.26* 3.68* 4.86* 4.80*  CALCIUM 7.9* 7.8* 7.8* 8.9 8.9 9.0  PHOS 3.2 3.2 2.4  --  2.8  --    Liver Function Tests:  Recent Labs Lab 02/26/14 1340 02/28/14 0709 03/03/14 1215  ALBUMIN 1.5* 1.4* 1.6*   No results found for this basename: LIPASE, AMYLASE,  in the last 168 hours No results found for this basename: AMMONIA,  in the last 168 hours CBC:  Recent Labs Lab 02/25/14 1120 02/26/14 0735 02/26/14 1340 02/28/14 0709 03/03/14 1215  WBC 7.1 7.3 8.3 7.8 9.3  HGB 9.6* 9.7* 9.8* 8.7* 9.7*  HCT 29.2* 29.7* 29.9* 27.4* 30.1*  MCV 90.1 90.8 92.0 93.8 93.2  PLT 145* 118* 123* 105* 167   Cardiac Enzymes: No results found for this basename: CKTOTAL, CKMB, CKMBINDEX, TROPONINI,  in the last 168 hours BNP: BNP (last 3 results) No results found for this basename: PROBNP,  in the last 8760 hours CBG: No results found for this basename: GLUCAP,  in the last 168 hours     Signed:  Azlynn Mitnick C  [facility] 03/04/2014, 11:18 AM

## 2014-03-04 NOTE — Progress Notes (Signed)
Report was called to [facility]. Transport was arranged by [employer]. Patient is awaiting arrival of ambulance. Patients son is in the room and has belongings. Patient is stable.

## 2014-03-04 NOTE — Progress Notes (Signed)
Physical Therapy Treatment Patient Details Name: [name] MRN: [id number] DOB: 09-16-1926 Today's Date: 03-10-2014    History of Present Illness [name] is a 80 y.o. female with PMH significant for stroke, stomach cancer, PUD, A fib on coumadin, CKD stage 3 last Cr per our record at 2.16, who was refer to the ED for further evaluation by Dr Deterdine due to worsening renal function ( cr at 7). In the ED patient was found to have hb at 6.2. Guaiac stool positive,  INR at 5.9. On further questioning patient relates melena, once a day for last few weeks. She denies nausea, vomiting, abdominal pain, diarrhea, dysuria. She was notice to be more confuse, weak and lethargic by son.    PT Comments    Patient assisted to chair and set up meal.  Initiated supervision for swallowing strategies per speech recommendations and follow through with nursing staff per patient request.  Seems limited with standing tolerance due to foot wounds.  Will need SNF rehab prior to d/c home.  Follow Up Recommendations  SNF     Equipment Recommendations  None recommended by PT    Recommendations for Other Services  None     Precautions / Restrictions Precautions Precautions: Fall    Mobility  Bed Mobility Overal bed mobility: Needs Assistance Bed Mobility: Supine to Sit Rolling: Mod assist   Supine to sit: Mod assist;HOB elevated     General bed mobility comments: assist for scooting and bringing trunk upright  Transfers Overall transfer level: Needs assistance Equipment used: Rolling walker (2 wheeled)   Sit to Stand: Mod assist Stand pivot transfers: Min assist       General transfer comment: assist with walker for stand turn to chair with cues for safety and technique; lifting assist from bed            Balance   Sitting-balance support: Bilateral upper extremity supported Sitting balance-Leahy Scale: Poor Sitting balance - Comments: needs supervision once assisted to upright  position for safety due to posterior bias and needs bilateral UE assist     Standing balance-Leahy Scale: Poor                      Cognition Arousal/Alertness: Awake/alert Behavior During Therapy: WFL for tasks assessed/performed Overall Cognitive Status: History of cognitive impairments - at baseline                                    Pertinent Vitals/Pain Min c/o pain on bottom in chair; stood and adjusted with pillow           PT Goals (current goals can now be found in the care plan section) Progress towards PT goals: Progressing toward goals    Frequency  Min 2X/week    PT Plan Current plan remains appropriate    End of Session Equipment Utilized During Treatment: Gait belt Activity Tolerance: Patient limited by fatigue Patient left: in chair;with call bell/phone within reach     Time: 1300-1328 PT Time Calculation (min): 28 min  Charges:  $Therapeutic Activity: 23-37 mins                    G Codes:      [name],[name] 2014-03-10, 5:00 PM [place], [place] 10-Mar-2014

## 2014-03-04 NOTE — Progress Notes (Signed)
Agree with intern note. Pryor Ochoa RD, LDN Inpatient Clinical Dietitian Pager: [phone] After Hours Pager: [phone]

## 2014-03-05 ENCOUNTER — Non-Acute Institutional Stay (SKILLED_NURSING_FACILITY): Payer: Medicare Other | Admitting: Adult Health

## 2014-03-05 DIAGNOSIS — K922 Gastrointestinal hemorrhage, unspecified: Secondary | ICD-10-CM

## 2014-03-05 DIAGNOSIS — D649 Anemia, unspecified: Secondary | ICD-10-CM

## 2014-03-05 DIAGNOSIS — F0390 Unspecified dementia without behavioral disturbance: Secondary | ICD-10-CM

## 2014-03-05 DIAGNOSIS — N186 End stage renal disease: Secondary | ICD-10-CM

## 2014-03-05 DIAGNOSIS — E038 Other specified hypothyroidism: Secondary | ICD-10-CM

## 2014-03-05 DIAGNOSIS — Z7901 Long term (current) use of anticoagulants: Secondary | ICD-10-CM

## 2014-03-05 DIAGNOSIS — E785 Hyperlipidemia, unspecified: Secondary | ICD-10-CM

## 2014-03-05 DIAGNOSIS — T50904A Poisoning by unspecified drugs, medicaments and biological substances, undetermined, initial encounter: Secondary | ICD-10-CM

## 2014-03-05 DIAGNOSIS — E032 Hypothyroidism due to medicaments and other exogenous substances: Secondary | ICD-10-CM

## 2014-03-05 DIAGNOSIS — I4891 Unspecified atrial fibrillation: Secondary | ICD-10-CM

## 2014-03-05 DIAGNOSIS — Z992 Dependence on renal dialysis: Secondary | ICD-10-CM

## 2014-03-05 DIAGNOSIS — R5381 Other malaise: Secondary | ICD-10-CM

## 2014-03-07 ENCOUNTER — Telehealth: Payer: Self-pay | Admitting: Internal Medicine

## 2014-03-07 NOTE — Telephone Encounter (Signed)
Called requesting results of CT scan of chest that was done at recent hospitalization.  Advised that only a CXR is noted in system.  States he will call her pulmonary doctor.  States he is trying to determine how to proceed with her health care.

## 2014-03-07 NOTE — Telephone Encounter (Signed)
New Message  Pt son called. Requests a call back to discuss CT results from hospital// please assist

## 2014-03-09 ENCOUNTER — Encounter: Payer: Self-pay | Admitting: Adult Health

## 2014-03-09 DIAGNOSIS — R5381 Other malaise: Secondary | ICD-10-CM | POA: Insufficient documentation

## 2014-03-09 DIAGNOSIS — F0390 Unspecified dementia without behavioral disturbance: Secondary | ICD-10-CM | POA: Insufficient documentation

## 2014-03-09 DIAGNOSIS — E785 Hyperlipidemia, unspecified: Secondary | ICD-10-CM | POA: Insufficient documentation

## 2014-03-09 DIAGNOSIS — N186 End stage renal disease: Secondary | ICD-10-CM | POA: Insufficient documentation

## 2014-03-09 DIAGNOSIS — Z992 Dependence on renal dialysis: Secondary | ICD-10-CM

## 2014-03-09 NOTE — Progress Notes (Signed)
Patient ID: [name], female   DOB: 1926-11-19, 80 y.o.   MRN: [id number]    [facility]   No Known Allergies   Chief Complaint  Patient presents with  . Hospitalization Follow-up    HPI:  She has been on long term coumadin for afib. She also has end stage renal disease. She was hospitalized for a  GI bleed with anemia requiring a transfusion; an uti and new dialysis. She is here for short term rehab with her goal to return back home.    Past Medical History  Diagnosis Date  . Dementia   . Hypertension   . Ulcer     Hx of  . Stomach cancer   . Snoring   . Fracture, intertrochanteric, left femur 03/25/2012  . Fracture, intertrochanteric, right femur 06/2008    s/p closed reduction and internal fixation  . Nodule of left lung     followed by Dr. Elsworth Soho // LLL spiculated nodule noted per CT in 2012 1.5x1.6x2.4, PET in 06/03/2011 was low uptake.  . Paroxysmal atrial fibrillation   . Tachycardia-bradycardia     Past Surgical History  Procedure Laterality Date  . Stomach surgery      3/4 of stomach removed due to stomach cancer  . Hip surgery  2009    right  . Total hip arthroplasty  03/29/2012    Procedure: TOTAL HIP ARTHROPLASTY;  Surgeon: Johnny Bridge, MD;  Location: [place];  Service: Orthopedics;  Laterality: Left;  Left Hip Hemiarthroplasty  . Esophagogastroduodenoscopy N/A 02/20/2014    Procedure: ESOPHAGOGASTRODUODENOSCOPY (EGD);  Surgeon: Arta Silence, MD;  [facility] ENDOSCOPY;  Service: Endoscopy;  Laterality: N/A;  . Insertion of dialysis catheter Right 02/24/2014    Procedure: INSERTION OF DIALYSIS CATHETER;  Surgeon: Conrad Viola, MD;  Location: [place];  Service: Vascular;  Laterality: Right;  . Av fistula placement Right 02/24/2014    Procedure: ARTERIOVENOUS (AV) FISTULA CREATION;  Surgeon: Conrad Beverly [place]ores, MD;  Location: [facility] OR;  Service: Vascular;  Laterality: Right;    VITAL SIGNS BP 105/57  Pulse 60  Ht 5\' 4"  (1.626 m)  Wt 98 lb (44.453 kg)  BMI  16.81 kg/m2   Patient's Medications  New Prescriptions   No medications on file  Previous Medications   ACETAMINOPHEN-CODEINE (TYLENOL #3) 300-30 MG PER TABLET    Take 1 tablet by mouth every 4 (four) hours as needed for moderate pain.   AMIODARONE (PACERONE) 200 MG TABLET    Take 100 mg by mouth daily.   ATORVASTATIN (LIPITOR) 40 MG TABLET    Take 40 mg by mouth daily at 6 PM.    CALCIUM ACETATE (PHOSLO) 667 MG CAPSULE    Take 1 capsule (667 mg total) by mouth 3 (three) times daily with meals.   CYANOCOBALAMIN (VITAMIN B 12 PO)    Take 1 capsule by mouth 3 (three) times a week.    IRON PO    Take 1 capsule twice daily (ferrous)   LEVOTHYROXINE (SYNTHROID) 25 MCG TABLET    Take 1 tablet (25 mcg total) by mouth daily before breakfast.   MULTIVITAMIN (RENA-VIT) TABS TABLET    Take 1 tablet by mouth at bedtime.   NUTRITIONAL SUPPLEMENTS (FEEDING SUPPLEMENT, NEPRO CARB STEADY,) LIQD    Take 237 mLs by mouth 2 (two) times daily between meals.   PANTOPRAZOLE (PROTONIX) 20 MG TABLET    Take 1 tablet (20 mg total) by mouth daily.  Modified Medications   No medications  on file  Discontinued Medications   No medications on file    SIGNIFICANT DIAGNOSTIC EXAMS  02-18-14: chest x-ray: No active cardiopulmonary disease.  02-18-14: renal ultrasound: Medical renal disease changes without evidence of mass or hydronephrosis.   Nonobstructing right renal calculi to 10 mm diameter.   02-24-14: chest x-ray: No pneumothorax is noted following catheter placement. Small pleural effusions are noted bilaterally.      LABS REVIEWED  02-17-14: tsh 7.08 02-18-14; wbc 8.6; hgb 6.2; hct 18.4; mcv 92; plt 239; glucose 102; bun 116; creat 7.87; k+3.8; na++136 ca++7.0; vit b12: 1915; folate >20.0; iron 108; tibc 146; urine culture: e-coli 02-20-14: wbc 7.5; hgb 8.9; hct 25.8; mcv 86; plt 192; glucose 52; bun 107; creat 7.54; k+3.7; na++132; ca++ 7.4; phos 7.3; albumin 1.9  02-25-14: wbc 7.1; hgb 9.6; hct 29.2;  mcv 90.1; plt 145; glucose 135; bun 79; creat 4.86; k+4.5; na++133 ca++8.9; phos 2.8; albumin 1.6     Review of Systems  Constitutional: Negative for malaise/fatigue.  Respiratory: Negative for cough and shortness of breath.   Cardiovascular: Negative for chest pain and palpitations.  Gastrointestinal: Negative for heartburn, abdominal pain, diarrhea and constipation.  Musculoskeletal: Negative for joint pain and myalgias.  Skin: Negative.   Psychiatric/Behavioral: Negative for depression. The patient is not nervous/anxious.      Physical Exam  Constitutional: No distress.  frail  Neck: Neck supple. No JVD present.  Cardiovascular: Normal rate, regular rhythm and intact distal pulses.   Respiratory: Effort normal and breath sounds normal.  GI: Soft. Bowel sounds are normal. She exhibits no distension. There is no tenderness.  Musculoskeletal: Normal range of motion. She exhibits no edema.  Neurological: She is alert.  Skin: Skin is warm and dry. She is not diaphoretic.  Dialysis access on chest wall without signs of infection present.   Psychiatric: She has a normal mood and affect.     ASSESSMENT/ PLAN:  1. ESRD on hemodialysis: will continue her hemodialysis three days per week; will continue phoslo 667 mg three times daily with renavite daily and will monitor   2. Anemia: she is presently stable will continue iron twice daily and will monitor   3. Gerd: with upper GIB: will continue her protonix 20 mg daily and will monitor; she is to follow up with [facility]  4. Afib: at this time; she is not on anticoagulation therapy; will continue her amiodarone 100 mg daily for rate control; will follow up with Dr. Wynonia Lawman to determine her need to restart coumadin therapy.   5. Dyslipidemia: will continue lipitor 40 mg daily   6. Hypothyroidism: will continue synthroid 25 mcg daily  7. Physical deconditioning: will continue therapy as directed; will continue to monitor her status;  her goal is short term rehab.   8. Dementia: no change in status at this time; is presently not on medications; will not make changes will monitor her status     Will check cbc; cmp   Time spent with patient 50 minutes.         Ok Edwards NP [facility]  Contact [phone] Monday through Friday 8am- 5pm  After hours call [phone]

## 2014-03-10 ENCOUNTER — Encounter: Payer: Self-pay | Admitting: Internal Medicine

## 2014-03-10 NOTE — Progress Notes (Signed)
This encounter was created in error - please disregard.

## 2014-03-17 ENCOUNTER — Encounter: Payer: Self-pay | Admitting: Physician Assistant

## 2014-03-17 ENCOUNTER — Ambulatory Visit (INDEPENDENT_AMBULATORY_CARE_PROVIDER_SITE_OTHER): Payer: Medicare Other | Admitting: Physician Assistant

## 2014-03-17 VITALS — BP 122/58 | HR 67 | Ht 64.0 in | Wt 102.0 lb

## 2014-03-17 DIAGNOSIS — I4891 Unspecified atrial fibrillation: Secondary | ICD-10-CM | POA: Diagnosis not present

## 2014-03-17 DIAGNOSIS — Z992 Dependence on renal dialysis: Secondary | ICD-10-CM

## 2014-03-17 DIAGNOSIS — K922 Gastrointestinal hemorrhage, unspecified: Secondary | ICD-10-CM

## 2014-03-17 DIAGNOSIS — I10 Essential (primary) hypertension: Secondary | ICD-10-CM

## 2014-03-17 DIAGNOSIS — N186 End stage renal disease: Secondary | ICD-10-CM

## 2014-03-17 DIAGNOSIS — I6350 Cerebral infarction due to unspecified occlusion or stenosis of unspecified cerebral artery: Secondary | ICD-10-CM

## 2014-03-17 NOTE — Progress Notes (Signed)
[place], [address] [place], [place]  [address] Phone: [phone] Fax:  [phone]  Date:  03/17/2014   ID:  [name], DOB Oct 30, 1926, MRN [id number]  PCP:  Robyn Haber, MD  Cardiologist:  Dr. Thompson Grayer     History of Present Illness: [name] is a 80 y.o. female with a hx of HTN, CKD Stage III, prior stroke. She was admitted in 08/2013 with left MCA stroke. During her hospitalization, she was noted to have paroxysmal atrial fibrillation. She was placed on amiodarone for rhythm control. She was also placed on Coumadin for anticoagulation (CHADS2-VASc=6).  There was some evidence of tachy-brady syndrome but with advanced age, we are trying to avoid pacemaker implantation.  She last saw Dr. Thompson Grayer 02/17/14.  She was no longer seeing Dr. Joseph Art for primary care.  She had not followed up for elevated TSH levels and had been missing appointments for INR checks.  She was admitted 3/10-3/24 worsening anemia (Hgb 6.2) in the setting of UGI bleed and progressive renal failure (Cr 7).  She was transfused with PRBCs.  She was guaiac positive and underwent EGD with GI that was unrevealing.  Anemia was felt to be multifactorial from slow GI losses in setting of coagulopathy (INR was 5.9 on admit), renal disease, malabsorption from prior GI surgery.  GI recommended that the patient remain off of coumadin for at least 2 weeks and asked that risks vs benefits be weighed prior to resuming.  She had AVF placed during her stay and hemodialysis was started.  She was seen by Palliative Care and Full Code Status was continued.  She was d/c to [facility].    She is here today with her son. The patient denies chest pain, dyspnea, orthopnea, PND or edema. She denies syncope.   Studies:  - Echo (08/2013): EF 65-70%, normal wall motion, grade 1 diastolic dysfunction, mild AI, mildly dilated aortic root, MAC.  - Carotid US (08/2013): Bilateral 1-39% ICA stenosis.   Recent  Labs: 08/30/2013: HDL Cholesterol 87; LDL (calc) 111*  02/17/2014: TSH 7.08*  02/19/2014: ALT 32  03/03/2014: Creatinine 4.80*; Hemoglobin 9.7*; Potassium 4.4   Wt Readings from Last 3 Encounters:  03/17/14 102 lb (46.267 kg)  03/05/14 98 lb (44.453 kg)  03/03/14 97 lb 10.6 oz (44.3 kg)     Past Medical History  Diagnosis Date  . Dementia   . Hypertension   . Ulcer     Hx of  . Stomach cancer   . Snoring   . Fracture, intertrochanteric, left femur 03/25/2012  . Fracture, intertrochanteric, right femur 06/2008    s/p closed reduction and internal fixation  . Nodule of left lung     followed by Dr. Elsworth Soho // LLL spiculated nodule noted per CT in 2012 1.5x1.6x2.4, PET in 06/03/2011 was low uptake.  . Paroxysmal atrial fibrillation   . Tachycardia-bradycardia     Current Outpatient Prescriptions  Medication Sig Dispense Refill  . acetaminophen-codeine (TYLENOL #3) 300-30 MG per tablet Take 1 tablet by mouth every 4 (four) hours as needed for moderate pain.  30 tablet  0  . amiodarone (PACERONE) 200 MG tablet Take 100 mg by mouth daily.      Marland Kitchen atorvastatin (LIPITOR) 40 MG tablet Take 40 mg by mouth daily at 6 PM.       . calcium acetate (PHOSLO) 667 MG capsule Take 1 capsule (667 mg total) by mouth 3 (three) times daily with meals.      Marland Kitchen  Cyanocobalamin (VITAMIN B 12 PO) Take 1 capsule by mouth 3 (three) times a week.       . ferrous sulfate 325 (65 FE) MG tablet Take 325 mg by mouth 2 (two) times daily with a meal.      . levothyroxine (SYNTHROID) 25 MCG tablet Take 1 tablet (25 mcg total) by mouth daily before breakfast.  30 tablet  3  . multivitamin (RENA-VIT) TABS tablet Take 1 tablet by mouth at bedtime.    0  . Nutritional Supplements (FEEDING SUPPLEMENT, NEPRO CARB STEADY,) LIQD Take 237 mLs by mouth 2 (two) times daily between meals.    0  . Ostomy Supplies (SKIN PREP WIPES) MISC by Does not apply route.      . pantoprazole (PROTONIX) 20 MG tablet Take 1 tablet (20 mg total)  by mouth daily.      . vitamin C (ASCORBIC ACID) 500 MG tablet Take 500 mg by mouth daily.      Marland Kitchen zinc sulfate 220 MG capsule Take 220 mg by mouth daily.       No current facility-administered medications for this visit.    Allergies:   Review of patient's allergies indicates no known allergies.   Social History:  The patient  reports that she quit smoking about 40 years ago. Her smoking use included Cigarettes. She has a .6 pack-year smoking history. She has never used smokeless tobacco. She reports that she drinks alcohol.   Family History:  The patient's family history includes Allergies in an other family member; Cancer in her father; Liver cancer in her son.   ROS:  Please see the history of present illness.      All other systems reviewed and negative.   PHYSICAL EXAM: VS:  BP 122/58  Pulse 67  Ht 5\' 4"  (1.626 m)  Wt 102 lb (46.267 kg)  BMI 17.50 kg/m2 Well nourished, well developed, in no acute distress HEENT: normal Neck: no JVDAt 90 Cardiac:  normal S1, S2; RRR; no murmur Lungs:  clear to auscultation bilaterally, no wheezing, rhonchi or rales Abd: soft, nontender, no hepatomegaly Ext: 1+ bilateral LE edema Skin: warm and dry Neuro:  CNs 2-12 intact, no focal abnormalities noted  EKG:  NSR, HR 61, RBBB     ASSESSMENT AND PLAN:  1. Atrial Fibrillation:  Maintaining NSR.  CHADS2-VASc=6.  She is off of Coumadin due to recent GI bleed. She would benefit from long-term anticoagulation in terms of stroke prevention. However, at this point, I am not convinced that she is a good candidate to resume Coumadin. I'll review this further with Dr. Rayann Heman. She will continue amiodarone at her current dose.  Her thyroid is being monitored by her primary care provider. Recent LFTs were stable.  2. ESRD:  She is on Tues, Thurs, Sat dialysis. 3. Anemia:  Managed by primary care and nephrology.  4. Hypertension:  Controlled.  5. Disposition:  I spent > 20 minutes reviewing the records.   Face to face time was > 50%.  F/u with Dr. Thompson Grayer in 6 weeks.   Signed, [name], PA-C  03/17/2014 8:55 AM

## 2014-03-17 NOTE — Patient Instructions (Signed)
Your physician recommends that you schedule a follow-up appointment on 05/01/14 @ 3 PM WITH DR. [place]  Your physician recommends that you continue on your current medications as directed. Please refer to the Current Medication list given to you today.

## 2014-03-19 ENCOUNTER — Telehealth: Payer: Self-pay | Admitting: Pulmonary Disease

## 2014-03-19 NOTE — Telephone Encounter (Signed)
ATC [name] again.  No answer and automated message stating that his mailbox is full and cannot accept any new messages.  Will attempt to call back tomorrow.

## 2014-03-19 NOTE — Telephone Encounter (Signed)
Called spoke with [name] who stated he "is in the middle of something right now" and requests to be called back in 20-30 minutes.  Will hold in triage to do so.

## 2014-03-20 NOTE — Telephone Encounter (Signed)
ATC Brad, NA and VM still full WCB

## 2014-03-20 NOTE — Telephone Encounter (Signed)
ATC Brad again and it goes straight to voicemail with message stating mailbox is full. WCB. [place] Bing, CMA

## 2014-03-21 ENCOUNTER — Non-Acute Institutional Stay (SKILLED_NURSING_FACILITY): Payer: BLUE CROSS/BLUE SHIELD | Admitting: Adult Health

## 2014-03-21 DIAGNOSIS — R630 Anorexia: Secondary | ICD-10-CM

## 2014-03-21 DIAGNOSIS — R609 Edema, unspecified: Secondary | ICD-10-CM

## 2014-03-21 NOTE — Telephone Encounter (Signed)
ATC call Brad again and NA, mailbox is full. Per protocol I will sign off on the message and await a call back. [place] Bing, CMA

## 2014-03-24 ENCOUNTER — Telehealth: Payer: Self-pay | Admitting: Pulmonary Disease

## 2014-03-24 ENCOUNTER — Non-Acute Institutional Stay (SKILLED_NURSING_FACILITY): Payer: Medicare Other | Admitting: Internal Medicine

## 2014-03-24 DIAGNOSIS — R911 Solitary pulmonary nodule: Secondary | ICD-10-CM

## 2014-03-24 DIAGNOSIS — T50904A Poisoning by unspecified drugs, medicaments and biological substances, undetermined, initial encounter: Secondary | ICD-10-CM

## 2014-03-24 DIAGNOSIS — I10 Essential (primary) hypertension: Secondary | ICD-10-CM

## 2014-03-24 DIAGNOSIS — E785 Hyperlipidemia, unspecified: Secondary | ICD-10-CM

## 2014-03-24 DIAGNOSIS — E032 Hypothyroidism due to medicaments and other exogenous substances: Secondary | ICD-10-CM

## 2014-03-24 DIAGNOSIS — R5381 Other malaise: Secondary | ICD-10-CM

## 2014-03-24 DIAGNOSIS — D62 Acute posthemorrhagic anemia: Secondary | ICD-10-CM

## 2014-03-24 DIAGNOSIS — N186 End stage renal disease: Secondary | ICD-10-CM

## 2014-03-24 DIAGNOSIS — Z992 Dependence on renal dialysis: Secondary | ICD-10-CM

## 2014-03-24 DIAGNOSIS — I4891 Unspecified atrial fibrillation: Secondary | ICD-10-CM

## 2014-03-24 DIAGNOSIS — E038 Other specified hypothyroidism: Secondary | ICD-10-CM

## 2014-03-24 DIAGNOSIS — K922 Gastrointestinal hemorrhage, unspecified: Secondary | ICD-10-CM

## 2014-03-24 DIAGNOSIS — M7989 Other specified soft tissue disorders: Secondary | ICD-10-CM

## 2014-03-24 NOTE — Telephone Encounter (Signed)
Called pt son [name]. He reports he is in the middle of something and needs u to call him back in 20-30 min. He reports evertime he calls he is on hold for a long time before he gets through only to be told we will call him back.\ WCB

## 2014-03-24 NOTE — Telephone Encounter (Signed)
Spoke w/ pt son [name]. Pt was in the hospital couple weeks ago and had CXR done. She wants RA to look at this and see if pet/CT is still needed. Please advise RA thanks

## 2014-03-25 ENCOUNTER — Encounter: Payer: Self-pay | Admitting: Internal Medicine

## 2014-03-25 NOTE — Telephone Encounter (Signed)
ATC Brad and NA and VM is full  i was able to enter a call back # for him to call back.

## 2014-03-25 NOTE — Telephone Encounter (Signed)
CXR does not show nodule Since it has been more than a year since her last CT, suggest rpt CT chest-no contrast & then schedule OV to discuss options

## 2014-03-25 NOTE — Progress Notes (Signed)
Patient ID: [name], female   DOB: [birth year], 80 y.o.   MRN: [id number]     Facility: [facility]   Chief Complaint  Patient presents with  . Hospitalization Follow-up    new admission   No Known Allergies  HPI 80 y/o female patient is here for STR after hospital admission with GI bleed and anemia. She is s/p blood transfusion. She also had UTI and was started on dialysis. She has history fo afib and was on coumadin which is on hold with her recent gi bleed history. She is seen in her room. Her foot swelling has increased as per staff  Review of Systems  Constitutional: Negative for malaise/fatigue. No fever or chills Respiratory: Negative for cough, congestion, shortness of breath.   Cardiovascular: Negative for chest pain and palpitations. Has leg swelling Gastrointestinal: Negative for heartburn, abdominal pain, diarrhea and constipation.  Musculoskeletal: Negative for joint pain and myalgias.  Skin: no new rash Psychiatric/Behavioral: Negative for depression. The patient is not nervous/anxious.    Past Medical History  Diagnosis Date  . Dementia   . Hypertension   . Ulcer     Hx of  . Stomach cancer   . Snoring   . Fracture, intertrochanteric, left femur 03/25/2012  . Fracture, intertrochanteric, right femur 06/2008    s/p closed reduction and internal fixation  . Nodule of left lung     followed by Dr. Elsworth Soho // LLL spiculated nodule noted per CT in 2012 1.5x1.6x2.4, PET in 06/03/2011 was low uptake.  . Paroxysmal atrial fibrillation   . Tachycardia-bradycardia    Past Surgical History  Procedure Laterality Date  . Stomach surgery      3/4 of stomach removed due to stomach cancer  . Hip surgery  2009    right  . Total hip arthroplasty  03/29/2012    Procedure: TOTAL HIP ARTHROPLASTY;  Surgeon: Johnny Bridge, MD;  Location: [place];  Service: Orthopedics;  Laterality: Left;  Left Hip Hemiarthroplasty  . Esophagogastroduodenoscopy N/A  02/20/2014    Procedure: ESOPHAGOGASTRODUODENOSCOPY (EGD);  Surgeon: Arta Silence, MD;  Location: [facility] ENDOSCOPY;  Service: Endoscopy;  Laterality: N/A;  . Insertion of dialysis catheter Right 02/24/2014    Procedure: INSERTION OF DIALYSIS CATHETER;  Surgeon: Conrad [place], MD;  Location: [place];  Service: Vascular;  Laterality: Right;  . Av fistula placement Right 02/24/2014    Procedure: ARTERIOVENOUS (AV) FISTULA CREATION;  Surgeon: Conrad [place], MD;  Location: [place];  Service: Vascular;  Laterality: Right;   History   Social History  . Marital Status: Married    Spouse Name: N/A    Number of Children: 2  . Years of Education: undergrad   Occupational History  . retired    Social History Main Topics  . Smoking status: Former Smoker -- 0.30 packs/day for 2 years    Types: Cigarettes    Quit date: 12/12/1973  . Smokeless tobacco: Never Used  . Alcohol Use: Yes     Comment: rare  . Drug Use: No  . Sexual Activity: Not on file   Other Topics Concern  . Not on file   Social History Narrative   Patient is right handed and resides with son   Family History  Problem Relation Age of Onset  . Cancer Father   . Liver cancer Son   . Allergies     Medication reviewed. See MAR  Physical Exam  BP 127/74  Pulse 65  Temp(Src)  97.2 F (36.2 C)  Resp 18  SpO2 95%  Constitutional: elderly female in no acute distress. frail Neck: Neck supple. No JVD present.  Cardiovascular: Normal rate, regular rhythm. Leg edema right foot swollen and right leg edema > left, poor dorsalis pedis, weeping lesion in right foot   Respiratory: Effort normal and breath sounds normal.  GI: Soft. Bowel sounds are normal. She exhibits no distension. There is no tenderness.  Musculoskeletal: weakness present Neurological: She is alert.  Skin: Skin is warm and dry. Dialysis site on chest clean Psychiatric: She has a normal mood and affect.   Assessment/plan  Deconditioning From her recent infection and  co-morbidities. Will have patient work with PT/OT as tolerated to regain strength and restore function.  Fall precautions are in place.  Right leg swelling More than left with weeping lesion. Will rule out DVT, get venous doppler now  ESRD  on hemodialysis: will continue her hemodialysis three days per week. Continue renavite  GI bleed Stable. Monitor h&h. Continue protonix 20 mg daily   Afib Off coumadin with gi bleed. Has been seen by cardiology. CHADS2 vasc socre of 6. continue her amiodarone 100 mg daily for now. Continue statin  Anemia From chronic disease. continue iron supplement, monitor h&h  Hyperlipidemia continue lipitor 40 mg daily   Hypothyroidism On amiodarone. Monitor tsh and continue synthroid 25 mcg daily   Blanchie Serve, MD  [facility] [phone] (Monday-Friday 8 am - 5 pm) [phone] (afterhours)

## 2014-03-26 NOTE — Telephone Encounter (Signed)
ATC brad and still no answer and VM is full

## 2014-03-27 NOTE — Telephone Encounter (Signed)
ATC brad. Went to VM but it was full. WCB

## 2014-03-28 NOTE — Telephone Encounter (Signed)
Pt son aware of results. Order placed and appt scheduled for 04/15/14. Nothing further needed

## 2014-03-30 NOTE — Progress Notes (Signed)
Patient ID: [name], female   DOB: 1926-06-02, 80 y.o.   MRN: [id number]     [facility]  No Known Allergies   Chief Complaint  Patient presents with  . Acute Visit    loss of appetite     HPI:  She has a poor appetite. The facility has initiated per protocol supplements to help with her nutritional status. The nursing staff is wondering if she wound benefit from an appetite stimulate. She also has some pedal edema present; bilaterally.  She denies any pain present at this time.   Past Medical History  Diagnosis Date  . Dementia   . Hypertension   . Ulcer     Hx of  . Stomach cancer   . Snoring   . Fracture, intertrochanteric, left femur 03/25/2012  . Fracture, intertrochanteric, right femur 06/2008    s/p closed reduction and internal fixation  . Nodule of left lung     followed by Dr. Elsworth Soho // LLL spiculated nodule noted per CT in 2012 1.5x1.6x2.4, PET in 06/03/2011 was low uptake.  . Paroxysmal atrial fibrillation   . Tachycardia-bradycardia     Past Surgical History  Procedure Laterality Date  . Stomach surgery      3/4 of stomach removed due to stomach cancer  . Hip surgery  2009    right  . Total hip arthroplasty  03/29/2012    Procedure: TOTAL HIP ARTHROPLASTY;  Surgeon: Johnny Bridge, MD;  Location: [place];  Service: Orthopedics;  Laterality: Left;  Left Hip Hemiarthroplasty  . Esophagogastroduodenoscopy N/A 02/20/2014    Procedure: ESOPHAGOGASTRODUODENOSCOPY (EGD);  Surgeon: Arta Silence, MD;  Location[facility] ENDOSCOPY;  Service: Endoscopy;  Laterality: N/A;  . Insertion of dialysis catheter Right 02/24/2014    Procedure: INSERTION OF DIALYSIS CATHETER;  Surgeon: Conrad [place], MD;  Location: [place];  Service: Vascular;  Laterality: Right;  . Av fistula placement Right 02/24/2014    Procedure: ARTERIOVENOUS (AV) FISTULA CREATION;  Surgeon: Conrad [place], MD;  Location: [facility];  Service: Vascular;  Laterality: Right;    VITAL SIGNS BP 108/77  Pulse 56  Ht 5'  4" (1.626 m)  Wt 103 lb 12.8 oz (47.083 kg)  BMI 17.81 kg/m2   Patient's Medications  New Prescriptions   No medications on file  Previous Medications   ACETAMINOPHEN-CODEINE (TYLENOL #3) 300-30 MG PER TABLET    Take 1 tablet by mouth every 4 (four) hours as needed for moderate pain.   AMIODARONE (PACERONE) 200 MG TABLET    Take 100 mg by mouth daily.   ATORVASTATIN (LIPITOR) 40 MG TABLET    Take 40 mg by mouth daily at 6 PM.    CALCIUM ACETATE (PHOSLO) 667 MG CAPSULE    Take 1 capsule (667 mg total) by mouth 3 (three) times daily with meals.   CYANOCOBALAMIN (VITAMIN B 12 PO)    Take 1 capsule by mouth 3 (three) times a week.    FERROUS SULFATE 325 (65 FE) MG TABLET    Take 325 mg by mouth 2 (two) times daily with a meal.   LEVOTHYROXINE (SYNTHROID) 25 MCG TABLET    Take 1 tablet (25 mcg total) by mouth daily before breakfast.   MULTIVITAMIN (RENA-VIT) TABS TABLET    Take 1 tablet by mouth at bedtime.   NUTRITIONAL SUPPLEMENTS (FEEDING SUPPLEMENT, NEPRO CARB STEADY,) LIQD    Take 237 mLs by mouth 2 (two) times daily between meals.   OSTOMY SUPPLIES (SKIN PREP WIPES)  MISC    by Does not apply route.   PANTOPRAZOLE (PROTONIX) 20 MG TABLET    Take 1 tablet (20 mg total) by mouth daily.   Senna s  Take 1 twice daily    VITAMIN C (ASCORBIC ACID) 500 MG TABLET    Take 500 mg by mouth daily.   ZINC SULFATE 220 MG CAPSULE    Take 220 mg by mouth daily.  Modified Medications   No medications on file  Discontinued Medications   No medications on file    SIGNIFICANT DIAGNOSTIC EXAMS  02-18-14: chest x-ray: No active cardiopulmonary disease.  02-18-14: renal ultrasound: Medical renal disease changes without evidence of mass or hydronephrosis.Nonobstructing right renal calculi to 10 mm diameter.   02-24-14: chest x-ray: No pneumothorax is noted following catheter placement. Small pleural effusions are noted bilaterally.      LABS REVIEWED  02-17-14: tsh 7.08 02-18-14; wbc 8.6; hgb 6.2;  hct 18.4; mcv 92; plt 239; glucose 102; bun 116; creat 7.87; k+3.8; na++136 ca++7.0; vit b12: 1915; folate >20.0; iron 108; tibc 146; urine culture: e-coli 02-20-14: wbc 7.5; hgb 8.9; hct 25.8; mcv 86; plt 192; glucose 52; bun 107; creat 7.54; k+3.7; na++132; ca++ 7.4; phos 7.3; albumin 1.9  02-25-14: wbc 7.1; hgb 9.6; hct 29.2; mcv 90.1; plt 145; glucose 135; bun 79; creat 4.86; k+4.5; na++133 ca++8.9; phos 2.8; albumin 1.6 03-06-14: wbc 6.2; hgb 8.3; hct 27.8 ;mcv 96.2; plt 209; glucose 85; bun 39; creat 4.5 k+ 3.6; na++133      Review of Systems  Constitutional: Negative for malaise/fatigue. has loss of appetite  Respiratory: Negative for cough and shortness of breath.   Cardiovascular: Negative for chest pain and palpitations. has edema  Gastrointestinal: Negative for heartburn, abdominal pain, diarrhea and constipation.  Musculoskeletal: Negative for joint pain and myalgias.  Skin: Negative.   Psychiatric/Behavioral: Negative for depression. The patient is not nervous/anxious.      Physical Exam  Constitutional: No distress.  frail  Neck: Neck supple. No JVD present.  Cardiovascular: Normal rate, regular rhythm and intact distal pulses.  1+ edema pedal bilaterally  Respiratory: Effort normal and breath sounds normal.  GI: Soft. Bowel sounds are normal. She exhibits no distension. There is no tenderness.  Musculoskeletal: Normal range of motion. She exhibits no edema.  Neurological: She is alert.  Skin: Skin is warm and dry. She is not diaphoretic.  Dialysis access on chest wall without signs of infection present.   Psychiatric: She has a normal mood and affect.      ASSESSMENT/ PLAN:  1. Anorexia: due to her poor appetite; will being remeron 7.5 mg nightly for 30 days and will monitor her status;   2. Edema: will use ted hose and will monitor her status.

## 2014-03-31 ENCOUNTER — Non-Acute Institutional Stay (SKILLED_NURSING_FACILITY): Payer: Medicare Other | Admitting: Adult Health

## 2014-03-31 DIAGNOSIS — E038 Other specified hypothyroidism: Secondary | ICD-10-CM

## 2014-03-31 DIAGNOSIS — E032 Hypothyroidism due to medicaments and other exogenous substances: Secondary | ICD-10-CM

## 2014-03-31 DIAGNOSIS — Z992 Dependence on renal dialysis: Secondary | ICD-10-CM

## 2014-03-31 DIAGNOSIS — D649 Anemia, unspecified: Secondary | ICD-10-CM

## 2014-03-31 DIAGNOSIS — K219 Gastro-esophageal reflux disease without esophagitis: Secondary | ICD-10-CM

## 2014-03-31 DIAGNOSIS — T50904A Poisoning by unspecified drugs, medicaments and biological substances, undetermined, initial encounter: Secondary | ICD-10-CM

## 2014-03-31 DIAGNOSIS — K5900 Constipation, unspecified: Secondary | ICD-10-CM

## 2014-03-31 DIAGNOSIS — I4891 Unspecified atrial fibrillation: Secondary | ICD-10-CM

## 2014-03-31 DIAGNOSIS — N186 End stage renal disease: Secondary | ICD-10-CM

## 2014-03-31 DIAGNOSIS — F0390 Unspecified dementia without behavioral disturbance: Secondary | ICD-10-CM

## 2014-03-31 DIAGNOSIS — Z7901 Long term (current) use of anticoagulants: Secondary | ICD-10-CM

## 2014-03-31 DIAGNOSIS — E785 Hyperlipidemia, unspecified: Secondary | ICD-10-CM

## 2014-03-31 DIAGNOSIS — I82409 Acute embolism and thrombosis of unspecified deep veins of unspecified lower extremity: Secondary | ICD-10-CM

## 2014-04-01 ENCOUNTER — Ambulatory Visit: Payer: Self-pay | Admitting: Physician Assistant

## 2014-04-09 ENCOUNTER — Non-Acute Institutional Stay (SKILLED_NURSING_FACILITY): Payer: Medicare Other | Admitting: Adult Health

## 2014-04-09 ENCOUNTER — Encounter: Payer: Self-pay | Admitting: Adult Health

## 2014-04-09 DIAGNOSIS — I4891 Unspecified atrial fibrillation: Secondary | ICD-10-CM

## 2014-04-09 DIAGNOSIS — K922 Gastrointestinal hemorrhage, unspecified: Secondary | ICD-10-CM

## 2014-04-09 DIAGNOSIS — R5381 Other malaise: Secondary | ICD-10-CM

## 2014-04-09 DIAGNOSIS — I82409 Acute embolism and thrombosis of unspecified deep veins of unspecified lower extremity: Secondary | ICD-10-CM

## 2014-04-09 NOTE — Progress Notes (Signed)
Patient ID: [name], female   DOB: 12/04/1926, 80 y.o.   MRN: [id number]     [facility]  No Known Allergies   Chief Complaint  Patient presents with  . Discharge Note    HPI:  She is being discharged to home with home health for pt/ot/nursing/aid. She will not need any dme. She will need prescriptions to be written. She had been admitted to this facility for short term rehab due to her physical deconditioning.   Past Medical History  Diagnosis Date  . Dementia   . Hypertension   . Ulcer     Hx of  . Stomach cancer   . Snoring   . Fracture, intertrochanteric, left femur 03/25/2012  . Fracture, intertrochanteric, right femur 06/2008    s/p closed reduction and internal fixation  . Nodule of left lung     followed by Dr. Elsworth Soho // LLL spiculated nodule noted per CT in 2012 1.5x1.6x2.4, PET in 06/03/2011 was low uptake.  . Paroxysmal atrial fibrillation   . Tachycardia-bradycardia     Past Surgical History  Procedure Laterality Date  . Stomach surgery      3/4 of stomach removed due to stomach cancer  . Hip surgery  2009    right  . Total hip arthroplasty  03/29/2012    Procedure: TOTAL HIP ARTHROPLASTY;  Surgeon: Johnny Bridge, MD;  Location: [place];  Service: Orthopedics;  Laterality: Left;  Left Hip Hemiarthroplasty  . Esophagogastroduodenoscopy N/A 02/20/2014    Procedure: ESOPHAGOGASTRODUODENOSCOPY (EGD);  Surgeon: Arta Silence, MD;  Location: [facility] ENDOSCOPY;  Service: Endoscopy;  Laterality: N/A;  . Insertion of dialysis catheter Right 02/24/2014    Procedure: INSERTION OF DIALYSIS CATHETER;  Surgeon: Conrad DeLisle, MD;  Location: [place];  Service: Vascular;  Laterality: Right;  . Av fistula placement Right 02/24/2014    Procedure: ARTERIOVENOUS (AV) FISTULA CREATION;  Surgeon: Conrad [place], MD;  Location: [facility];  Service: Vascular;  Laterality: Right;    VITAL SIGNS BP 144/62  Pulse 66  Ht 5\' 4"  (1.626 m)  Wt 100 lb 12.8 oz (45.723 kg)  BMI 17.29  kg/m2   Patient's Medications  New Prescriptions   No medications on file  Previous Medications   ACETAMINOPHEN-CODEINE (TYLENOL #3) 300-30 MG PER TABLET    Take 1 tablet by mouth every 4 (four) hours as needed for moderate pain.   AMIODARONE (PACERONE) 200 MG TABLET    Take 100 mg by mouth daily.   ATORVASTATIN (LIPITOR) 40 MG TABLET    Take 40 mg by mouth daily at 6 PM.    CALCIUM ACETATE (PHOSLO) 667 MG CAPSULE    Take 1 capsule (667 mg total) by mouth 3 (three) times daily with meals.   CYANOCOBALAMIN (VITAMIN B 12 PO)    Take 1 capsule by mouth 3 (three) times a week.    FERROUS SULFATE 325 (65 FE) MG TABLET    Take 325 mg by mouth 2 (two) times daily with a meal.   LEVOTHYROXINE (SYNTHROID) 25 MCG TABLET    Take 1 tablet (25 mcg total) by mouth daily before breakfast.   MULTIVITAMIN (RENA-VIT) TABS TABLET    Take 1 tablet by mouth at bedtime.   NUTRITIONAL SUPPLEMENTS (FEEDING SUPPLEMENT, NEPRO CARB STEADY,) LIQD    Take 237 mLs by mouth 2 (two) times daily between meals.   OSTOMY SUPPLIES (SKIN PREP WIPES) MISC    by Does not apply route.   PANTOPRAZOLE (PROTONIX) 20 MG  TABLET    Take 1 tablet (20 mg total) by mouth daily.   VITAMIN C (ASCORBIC ACID) 500 MG TABLET    Take 500 mg by mouth daily.   ZINC SULFATE 220 MG CAPSULE    Take 220 mg by mouth daily.  Modified Medications   No medications on file  Discontinued Medications   No medications on file    SIGNIFICANT DIAGNOSTIC EXAMS  02-18-14: chest x-ray: No active cardiopulmonary disease.  02-18-14: renal ultrasound: Medical renal disease changes without evidence of mass or hydronephrosis.Nonobstructing right renal calculi to 10 mm diameter.   02-24-14: chest x-ray: No pneumothorax is noted following catheter placement. Small pleural effusions are noted bilaterally.      LABS REVIEWED  02-17-14: tsh 7.08 02-18-14; wbc 8.6; hgb 6.2; hct 18.4; mcv 92; plt 239; glucose 102; bun 116; creat 7.87; k+3.8; na++136 ca++7.0; vit  b12: 1915; folate >20.0; iron 108; tibc 146; urine culture: e-coli 02-20-14: wbc 7.5; hgb 8.9; hct 25.8; mcv 86; plt 192; glucose 52; bun 107; creat 7.54; k+3.7; na++132; ca++ 7.4; phos 7.3; albumin 1.9  02-25-14: wbc 7.1; hgb 9.6; hct 29.2; mcv 90.1; plt 145; glucose 135; bun 79; creat 4.86; k+4.5; na++133 ca++8.9; phos 2.8; albumin 1.6 03-06-14: wbc 6.2; hgb 8.3; hct 27.8 ;mcv 96.2; plt 209; glucose 85; bun 39; creat 4.5 k+ 3.6; na++133      Review of Systems  Constitutional: Negative for malaise/fatigue. has loss of appetite  Respiratory: Negative for cough and shortness of breath.   Cardiovascular: Negative for chest pain and palpitations. has edema  Gastrointestinal: Negative for heartburn, abdominal pain, diarrhea and constipation.  Musculoskeletal: Negative for joint pain and myalgias.  Skin: Negative.   Psychiatric/Behavioral: Negative for depression. The patient is not nervous/anxious.      Physical Exam  Constitutional: No distress.  frail  Neck: Neck supple. No JVD present.  Cardiovascular: Normal rate, regular rhythm and intact distal pulses.  1+ edema pedal bilaterally  Respiratory: Effort normal and breath sounds normal.  GI: Soft. Bowel sounds are normal. She exhibits no distension. There is no tenderness.  Musculoskeletal: Normal range of motion. She exhibits no edema.  Neurological: She is alert.  Skin: Skin is warm and dry. She is not diaphoretic.    Psychiatric: She has a normal mood and affect.       ASSESSMENT/ PLAN:  Will discharge to home with home health for pt/ot/nursing/aid. No dme needed. Her prescriptions have been written.    Time spent with patient 40 minutes.       Ok Edwards NP [facility]  Contact [phone] Monday through Friday 8am- 5pm  After hours call [phone]

## 2014-04-10 DIAGNOSIS — K5900 Constipation, unspecified: Secondary | ICD-10-CM | POA: Insufficient documentation

## 2014-04-10 DIAGNOSIS — K219 Gastro-esophageal reflux disease without esophagitis: Secondary | ICD-10-CM | POA: Insufficient documentation

## 2014-04-10 NOTE — Progress Notes (Signed)
Patient ID: [name], female   DOB: 1926-06-15, 80 y.o.   MRN: [id number]     [name]  No Known Allergies   Chief Complaint  Patient presents with  . Medical Management of Chronic Issues    HPI:  She is being seen for the management of her chronic illnesses. She has developed a right lower extremity dvt and has been placed on eliquis 2.5 mg twice daily. This will need to be long term therapy for her as she has afib. She had been on coumadin therapy in the past. There are no concerns being voiced by the nursing staff at this time. She state that she feels "ok".     Past Medical History  Diagnosis Date  . Dementia   . Hypertension   . Ulcer     Hx of  . Stomach cancer   . Snoring   . Fracture, intertrochanteric, left femur 03/25/2012  . Fracture, intertrochanteric, right femur 06/2008    s/p closed reduction and internal fixation  . Nodule of left lung     followed by Dr. Elsworth Soho // LLL spiculated nodule noted per CT in 2012 1.5x1.6x2.4, PET in 06/03/2011 was low uptake.  . Paroxysmal atrial fibrillation   . Tachycardia-bradycardia     Past Surgical History  Procedure Laterality Date  . Stomach surgery      3/4 of stomach removed due to stomach cancer  . Hip surgery  2009    right  . Total hip arthroplasty  03/29/2012    Procedure: TOTAL HIP ARTHROPLASTY;  Surgeon: Johnny Bridge, MD;  Location: [place];  Service: Orthopedics;  Laterality: Left;  Left Hip Hemiarthroplasty  . Esophagogastroduodenoscopy N/A 02/20/2014    Procedure: ESOPHAGOGASTRODUODENOSCOPY (EGD);  Surgeon: Arta Silence, MD;  Location: [facility] ENDOSCOPY;  Service: Endoscopy;  Laterality: N/A;  . Insertion of dialysis catheter Right 02/24/2014    Procedure: INSERTION OF DIALYSIS CATHETER;  Surgeon: Conrad Solis, MD;  Location: [place];  Service: Vascular;  Laterality: Right;  . Av fistula placement Right 02/24/2014    Procedure: ARTERIOVENOUS (AV) FISTULA CREATION;  Surgeon: Conrad [place], MD;  Location: [facility];   Service: Vascular;  Laterality: Right;    VITAL SIGNS BP 127/75  Pulse 69  Ht 5\' 4"  (1.626 m)  Wt 100 lb (45.36 kg)  BMI 17.16 kg/m2   Patient's Medications  New Prescriptions   No medications on file  Previous Medications   ACETAMINOPHEN-CODEINE (TYLENOL #3) 300-30 MG PER TABLET    Take 1 tablet by mouth every 4 (four) hours as needed for moderate pain.   AMIODARONE (PACERONE) 200 MG TABLET    Take 100 mg by mouth daily.   ATORVASTATIN (LIPITOR) 40 MG TABLET    Take 40 mg by mouth daily at 6 PM.    CALCIUM ACETATE (PHOSLO) 667 MG CAPSULE    Take 1 capsule (667 mg total) by mouth 3 (three) times daily with meals.   CYANOCOBALAMIN (VITAMIN B 12 PO)    Take 1 capsule by mouth 3 (three) times a week.    eliquis 2.5 mg  Take bid    FERROUS SULFATE 325 (65 FE) MG TABLET    Take 325 mg by mouth 2 (two) times daily with a meal.   LEVOTHYROXINE (SYNTHROID) 25 MCG TABLET    Take 1 tablet (25 mcg total) by mouth daily before breakfast.   MULTIVITAMIN (RENA-VIT) TABS TABLET    Take 1 tablet by mouth at bedtime.   NUTRITIONAL  SUPPLEMENTS (FEEDING SUPPLEMENT, NEPRO CARB STEADY,) LIQD    Take 237 mLs by mouth 2 (two) times daily between meals.   OSTOMY SUPPLIES (SKIN PREP WIPES) MISC    by Does not apply route.   PANTOPRAZOLE (PROTONIX) 20 MG TABLET    Take 1 tablet (20 mg total) by mouth daily.   remeron 7.5 mg  Take night for 30 days from 03-21-14    Senna s Take twice daily    VITAMIN C (ASCORBIC ACID) 500 MG TABLET    Take 500 mg by mouth daily.   ZINC SULFATE 220 MG CAPSULE    Take 220 mg by mouth daily.  Modified Medications   No medications on file  Discontinued Medications   No medications on file    SIGNIFICANT DIAGNOSTIC EXAMS  02-18-14: chest x-ray: No active cardiopulmonary disease.  02-18-14: renal ultrasound: Medical renal disease changes without evidence of mass or hydronephrosis.Nonobstructing right renal calculi to 10 mm diameter.   02-24-14: chest x-ray: No pneumothorax  is noted following catheter placement. Small pleural effusions are noted bilaterally.  03-24-14: right lower extremity doppler; + for dvt       LABS REVIEWED  02-17-14: tsh 7.08 02-18-14; wbc 8.6; hgb 6.2; hct 18.4; mcv 92; plt 239; glucose 102; bun 116; creat 7.87; k+3.8; na++136 ca++7.0; vit b12: 1915; folate >20.0; iron 108; tibc 146; urine culture: e-coli 02-20-14: wbc 7.5; hgb 8.9; hct 25.8; mcv 86; plt 192; glucose 52; bun 107; creat 7.54; k+3.7; na++132; ca++ 7.4; phos 7.3; albumin 1.9  02-25-14: wbc 7.1; hgb 9.6; hct 29.2; mcv 90.1; plt 145; glucose 135; bun 79; creat 4.86; k+4.5; na++133 ca++8.9; phos 2.8; albumin 1.6 03-06-14: wbc 6.2; hgb 8.3; hct 27.8 ;mcv 96.2; plt 209; glucose 85; bun 39; creat 4.5 k+ 3.6; na++133      Review of Systems  Constitutional: Negative for malaise/fatigue. has loss of appetite  Respiratory: Negative for cough and shortness of breath.   Cardiovascular: Negative for chest pain and palpitations. has edema  Gastrointestinal: Negative for heartburn, abdominal pain, diarrhea and constipation.  Musculoskeletal: Negative for joint pain and myalgias.  Skin: Negative.   Psychiatric/Behavioral: Negative for depression. The patient is not nervous/anxious.      Physical Exam  Constitutional: No distress.  frail  Neck: Neck supple. No JVD present.  Cardiovascular: Normal rate, regular rhythm and intact distal pulses.  1+ edema pedal bilaterally  Respiratory: Effort normal and breath sounds normal.  GI: Soft. Bowel sounds are normal. She exhibits no distension. There is no tenderness.  Musculoskeletal: Normal range of motion. 2-3+ edema present worse on right  Neurological: She is alert.  Skin: Skin is warm and dry. She is not diaphoretic.    Psychiatric: She has a normal mood and affect.      ASSESSMENT/ PLAN:  1. Right lower extremity dvt: is stable will continue eliquis 2.5 mg twice daily and will monitor   2. Afib: her heart rate is stable; will  continue eliquis 2.5 mg twice daily; and amiodarone 100 mg daily for rate control and will monitor her status   3. esrd on hemodialysis: will continue dialysis three days per week; 1000cc fluid restriction; phoslo 667 three times daily  And renavit daily   4. Hypothyroidism: will continue synthroid 25 mcg daily  5. Dyslipidemia: will continue lipitor 40 mg daily   6. Anemia: will continue iron twice daily   7. Gerd: will continue protonix 20 mg daily   8. Dementia: no change in her status;  is presently not on medications will not make changes will monitor her status.   9. Anorexia: will continue her remeron 7.5 mg nightly for a total of 30 days and will monitor   10. Constipation will continue senna s twice daily

## 2014-04-15 ENCOUNTER — Inpatient Hospital Stay: Admission: RE | Admit: 2014-04-15 | Payer: Self-pay | Source: Ambulatory Visit

## 2014-04-15 ENCOUNTER — Ambulatory Visit: Payer: Self-pay | Admitting: Pulmonary Disease

## 2014-04-16 ENCOUNTER — Other Ambulatory Visit: Payer: Self-pay

## 2014-04-16 ENCOUNTER — Telehealth: Payer: Self-pay | Admitting: Pulmonary Disease

## 2014-04-16 LAB — POTASSIUM: POTASSIUM: 4.3 mmol/L (ref 3.5–5.1)

## 2014-04-16 NOTE — Telephone Encounter (Signed)
CT order is in epic form April. Pt has pending appt 05/12/14 w/ RA. Please advise PCC's thanks

## 2014-04-16 NOTE — Telephone Encounter (Signed)
Spoke to son appt 's [facility] 2pm chest ct 3:45pm dr Elsworth Soho 05/12/14 [facility] [place]

## 2014-04-18 DIAGNOSIS — K922 Gastrointestinal hemorrhage, unspecified: Secondary | ICD-10-CM

## 2014-04-18 DIAGNOSIS — L89109 Pressure ulcer of unspecified part of back, unspecified stage: Secondary | ICD-10-CM

## 2014-04-18 DIAGNOSIS — M6281 Muscle weakness (generalized): Secondary | ICD-10-CM

## 2014-04-18 DIAGNOSIS — L8992 Pressure ulcer of unspecified site, stage 2: Secondary | ICD-10-CM

## 2014-04-21 ENCOUNTER — Other Ambulatory Visit: Payer: Self-pay | Admitting: Internal Medicine

## 2014-04-22 ENCOUNTER — Other Ambulatory Visit: Payer: Self-pay | Admitting: Internal Medicine

## 2014-04-23 ENCOUNTER — Ambulatory Visit (INDEPENDENT_AMBULATORY_CARE_PROVIDER_SITE_OTHER): Payer: Medicare Other | Admitting: Internal Medicine

## 2014-04-23 ENCOUNTER — Encounter: Payer: Self-pay | Admitting: Internal Medicine

## 2014-04-23 VITALS — BP 124/54 | HR 68 | Temp 96.9°F | Ht 62.0 in | Wt 96.4 lb

## 2014-04-23 DIAGNOSIS — H6121 Impacted cerumen, right ear: Secondary | ICD-10-CM | POA: Insufficient documentation

## 2014-04-23 DIAGNOSIS — L600 Ingrowing nail: Secondary | ICD-10-CM

## 2014-04-23 DIAGNOSIS — E039 Hypothyroidism, unspecified: Secondary | ICD-10-CM

## 2014-04-23 DIAGNOSIS — K219 Gastro-esophageal reflux disease without esophagitis: Secondary | ICD-10-CM

## 2014-04-23 DIAGNOSIS — I6350 Cerebral infarction due to unspecified occlusion or stenosis of unspecified cerebral artery: Secondary | ICD-10-CM

## 2014-04-23 DIAGNOSIS — F0390 Unspecified dementia without behavioral disturbance: Secondary | ICD-10-CM

## 2014-04-23 DIAGNOSIS — I4891 Unspecified atrial fibrillation: Secondary | ICD-10-CM

## 2014-04-23 DIAGNOSIS — D518 Other vitamin B12 deficiency anemias: Secondary | ICD-10-CM

## 2014-04-23 DIAGNOSIS — D519 Vitamin B12 deficiency anemia, unspecified: Secondary | ICD-10-CM

## 2014-04-23 DIAGNOSIS — Z992 Dependence on renal dialysis: Secondary | ICD-10-CM

## 2014-04-23 DIAGNOSIS — H6120 Impacted cerumen, unspecified ear: Secondary | ICD-10-CM

## 2014-04-23 DIAGNOSIS — R634 Abnormal weight loss: Secondary | ICD-10-CM | POA: Insufficient documentation

## 2014-04-23 DIAGNOSIS — N186 End stage renal disease: Secondary | ICD-10-CM

## 2014-04-23 DIAGNOSIS — E785 Hyperlipidemia, unspecified: Secondary | ICD-10-CM

## 2014-04-23 MED ORDER — MIRTAZAPINE 15 MG PO TABS: 15.0000 mg | ORAL_TABLET | Freq: Every day | ORAL | Status: DC

## 2014-04-23 MED ORDER — CARBAMIDE PEROXIDE 6.5 % OT SOLN: 5.0000 [drp] | Freq: Two times a day (BID) | OTIC | Status: DC

## 2014-04-23 NOTE — Progress Notes (Signed)
Patient ID: [name], female   DOB: Oct 08, 1926, 80 y.o.   MRN: [id number]    Chief Complaint  Patient presents with  . New Evaluation    New Acute, weight loss, afib, med refill, toe problem, ear discomfort   No Known Allergies  HPI 80y/o female patient is here to establish care. She has history of dementia, left MCA stroke, ESRD on HD, afib, HTN, gi bleed and GERD. She is here with her son. There are couple of concerns that the patient and son has. The son is concerned about her ingrown toe nail and long nails in both her feet. She has poor appetite and has low mood as per son. She complaints of stuffed ears. She was residing in SNF and underwent STR and now is home with home health services. She feels weak and tired easily. No other concern from patient. Son requests refills on her remeron.  Review of Systems  Constitutional: Negative for fever, chills,diaphoresis. has weight loss HENT: Negative for congestion, hearing loss and sore throat.   Eyes: Negative for blurred vision, double vision and discharge.  Respiratory: Negative for cough, sputum production, shortness of breath and wheezing.   Cardiovascular: Negative for chest pain, palpitations, orthopnea. Has leg swelling. wears ted hose Gastrointestinal: Negative for heartburn, nausea, vomiting, abdominal pain, diarrhea and constipation.  Genitourinary: Negative for dysuria, urgency, frequency and flank pain.  Musculoskeletal: Negative for back pain. Has right shoulder limited motion Skin: Negative for itching and rash. has a sacral wound Neurological: Negative for dizziness, tingling, headaches.  Psychiatric/Behavioral: Negative for depression. The patient is not nervous/anxious.    Past Medical History  Diagnosis Date  . Dementia   . Hypertension   . Ulcer     Hx of  . Stomach cancer   . Snoring   . Fracture, intertrochanteric, left femur 03/25/2012  . Fracture, intertrochanteric, right femur 06/2008    s/p closed  reduction and internal fixation  . Nodule of left lung     followed by Dr. Elsworth Soho // LLL spiculated nodule noted per CT in 2012 1.5x1.6x2.4, PET in 06/03/2011 was low uptake.  . Paroxysmal atrial fibrillation   . Tachycardia-bradycardia    Past Surgical History  Procedure Laterality Date  . Stomach surgery      3/4 of stomach removed due to stomach cancer  . Hip surgery  2009    right  . Total hip arthroplasty  03/29/2012    Procedure: TOTAL HIP ARTHROPLASTY;  Surgeon: Johnny Bridge, MD;  Location: [place];  Service: Orthopedics;  Laterality: Left;  Left Hip Hemiarthroplasty  . Esophagogastroduodenoscopy N/A 02/20/2014    Procedure: ESOPHAGOGASTRODUODENOSCOPY (EGD);  Surgeon: Arta Silence, MD;  Location: [facility] ENDOSCOPY;  Service: Endoscopy;  Laterality: N/A;  . Insertion of dialysis catheter Right 02/24/2014    Procedure: INSERTION OF DIALYSIS CATHETER;  Surgeon: Conrad [place], MD;  Location: [place];  Service: Vascular;  Laterality: Right;  . Av fistula placement Right 02/24/2014    Procedure: ARTERIOVENOUS (AV) FISTULA CREATION;  Surgeon: Conrad [place], MD;  Location: [place];  Service: Vascular;  Laterality: Right;   Current Outpatient Prescriptions on File Prior to Visit  Medication Sig Dispense Refill  . amiodarone (PACERONE) 200 MG tablet Take 100 mg by mouth daily.      Marland Kitchen atorvastatin (LIPITOR) 40 MG tablet Take 40 mg by mouth daily at 6 PM.       . calcium acetate (PHOSLO) 667 MG capsule Take 1 capsule (667 mg total)  by mouth 3 (three) times daily with meals.      Marland Kitchen levothyroxine (SYNTHROID) 25 MCG tablet Take 1 tablet (25 mcg total) by mouth daily before breakfast.  30 tablet  3  . multivitamin (RENA-VIT) TABS tablet Take 1 tablet by mouth at bedtime.    0  . Nutritional Supplements (FEEDING SUPPLEMENT, NEPRO CARB STEADY,) LIQD Take 237 mLs by mouth 2 (two) times daily between meals.    0  . Ostomy Supplies (SKIN PREP WIPES) MISC by Does not apply route.      . pantoprazole (PROTONIX) 20  MG tablet Take 1 tablet (20 mg total) by mouth daily.      . Cyanocobalamin (VITAMIN B 12 PO) Take 1 capsule by mouth 3 (three) times a week.        No current facility-administered medications on file prior to visit.   Family History  Problem Relation Age of Onset  . Cancer Father   . Liver cancer Son   . Allergies     History   Social History  . Marital Status: Married    Spouse Name: N/A    Number of Children: 2  . Years of Education: undergrad   Occupational History  . retired    Social History Main Topics  . Smoking status: Former Smoker -- 0.30 packs/day for 2 years    Types: Cigarettes    Quit date: 12/12/1973  . Smokeless tobacco: Never Used  . Alcohol Use: Yes     Comment: rare  . Drug Use: Not on file  . Sexual Activity: Not on file   Other Topics Concern  . Not on file   Social History Narrative   Patient is right handed and resides with son   Physical exam BP 124/54  Pulse 68  Temp(Src) 96.9 F (36.1 C) (Oral)  Ht 5\' 2"  (1.575 m)  Wt 96 lb 6.4 oz (43.727 kg)  BMI 17.63 kg/m2  General- elderly female in no acute distress, frail Head- atraumatic, normocephalic Eyes- PERRLA, EOMI, no pallor, no icterus, no discharge Ears- left ear normal tympanic membrane and normal external ear canal, has some cerumen but right ear is impacted with cerumen   Neck- no lymphadenopathy, no thyromegaly Nose- normal nasal mucosa Mouth- normal mucus membrane, no oral thrush, normal oropharynx Cardiovascular- normal s1,s2, no murmurs/ rubs/ gallops, intact distal pulses Respiratory- bilateral clear to auscultation, no wheeze, no rhonchi, no crackles Abdomen- bowel sounds present, soft, non tender, no organomegaly Musculoskeletal- able to move all 4 extremities, right UE limited abduction and overhead rotation, uses wheelchair, has bilateral leg edema with ted hose in place, has hypertrophied nails and ingrown toe nails Neurological- no focal deficit Skin- warm and  dry Psychiatry- alert and oriented to person, place and somewhat to time, normal mood and affect  Labs- Lab Results  Component Value Date   WBC 9.3 03/03/2014   HGB 9.7* 03/03/2014   HCT 30.1* 03/03/2014   MCV 93.2 03/03/2014   PLT 167 03/03/2014   CMP     Component Value Date/Time   NA 132* 03/03/2014 1230   K 4.4 03/03/2014 1230   CL 93* 03/03/2014 1230   CO2 25 03/03/2014 1230   GLUCOSE 122* 03/03/2014 1230   BUN 49* 03/03/2014 1230   CREATININE 4.80* 03/03/2014 1230   CALCIUM 9.0 03/03/2014 1230   PROT 5.2* 02/19/2014 0521   ALBUMIN 1.6* 03/03/2014 1215   AST 48* 02/19/2014 0521   ALT 32 02/19/2014 0521   ALKPHOS 94 02/19/2014  0521   BILITOT 0.7 02/19/2014 0521   GFRNONAA 7* 03/03/2014 1230   GFRAA 9* 03/03/2014 1230   Lab Results  Component Value Date   HGBA1C 5.9* 08/30/2013   Lab Results  Component Value Date   TSH 7.08* 02/17/2014   03-06-14: wbc 6.2; hgb 8.3; hct 27.8 ;mcv 96.2; plt 209; glucose 85; bun 39; creat 4.5 k+ 3.6; na++133   Studies:  - Echo (08/2013): EF 65-70%, normal wall motion, grade 1 diastolic dysfunction, mild AI, mildly dilated aortic root, MAC.  - Carotid US (08/2013): Bilateral 1-39% ICA stenosis.    Assessment/plan  1. Right ear impacted cerumen Right ear lavage done, most of the cerumen is removed and debrox for both ears due to remaining cerumen  2. Loss of weight Likely in setting of progressive dementia with malnutrition and chronic illness. Check prealbumin level. Continue protein supplements. Will increase remeron to 15 mg daily to help with her appetite and mood. Continue home health services for PT , OT and nursing aid for now.  - Prealbumin  3. A-fib Off coumadin due to gi bleed. Rate controlled. Continue amiodarone 100 mg daily. Reviewed cardiology office note  4. Unspecified hypothyroidism Stable. Continue levothyroxine 25 mcg daily. Check tsh today - TSH  5. ESRD on dialysis 3 days a week. Assess for anemia and need of aransep given her  weakness. Avoid NSAIDs. Follows with Dr Rayann Heman - CBC with Differential - Lipid Panel - CMP  6. GERD (gastroesophageal reflux disease) Stable. Continue pantoprazole 40 mg daily - CBC with Differential  7. Dementia without behavioral disturbance Likely vascular and senile component present. Also has hypothyroidism contributing some. Off all medication at present. Son is her primary caretaker and pt living at home for now - TSH  8. Other and unspecified hyperlipidemia Continue statin and check lipid panel - Lipid Panel  9. IGTN (ingrowing toe nail) - Ambulatory referral to Podiatry  10. B12 deficiency anemia Check b12 level - Vitamin B12

## 2014-04-24 ENCOUNTER — Telehealth: Payer: Self-pay

## 2014-04-24 DIAGNOSIS — E039 Hypothyroidism, unspecified: Secondary | ICD-10-CM

## 2014-04-24 LAB — CBC WITH DIFFERENTIAL/PLATELET
BASOS ABS: 0 10*3/uL (ref 0.0–0.2)
BASOS: 1 %
EOS ABS: 0 10*3/uL (ref 0.0–0.4)
Eos: 1 %
HCT: 39.2 % (ref 34.0–46.6)
HEMOGLOBIN: 12.2 g/dL (ref 11.1–15.9)
Immature Grans (Abs): 0 10*3/uL (ref 0.0–0.1)
Immature Granulocytes: 0 %
Lymphocytes Absolute: 1 10*3/uL (ref 0.7–3.1)
Lymphs: 17 %
MCH: 30 pg (ref 26.6–33.0)
MCHC: 31.1 g/dL — ABNORMAL LOW (ref 31.5–35.7)
MCV: 97 fL (ref 79–97)
MONOCYTES: 11 %
Monocytes Absolute: 0.6 10*3/uL (ref 0.1–0.9)
NEUTROS ABS: 4 10*3/uL (ref 1.4–7.0)
Neutrophils Relative %: 70 %
RBC: 4.06 x10E6/uL (ref 3.77–5.28)
RDW: 16.8 % — ABNORMAL HIGH (ref 12.3–15.4)
WBC: 5.7 10*3/uL (ref 3.4–10.8)

## 2014-04-24 LAB — COMPREHENSIVE METABOLIC PANEL
ALK PHOS: 132 IU/L — AB (ref 39–117)
ALT: 23 IU/L (ref 0–32)
AST: 36 IU/L (ref 0–40)
Albumin/Globulin Ratio: 0.8 — ABNORMAL LOW (ref 1.1–2.5)
Albumin: 2.1 g/dL — ABNORMAL LOW (ref 3.5–4.7)
BUN / CREAT RATIO: 11 (ref 11–26)
BUN: 25 mg/dL (ref 8–27)
CALCIUM: 8 mg/dL — AB (ref 8.7–10.3)
CHLORIDE: 100 mmol/L (ref 97–108)
CO2: 24 mmol/L (ref 18–29)
Creatinine, Ser: 2.28 mg/dL — ABNORMAL HIGH (ref 0.57–1.00)
GFR calc Af Amer: 22 mL/min/{1.73_m2} — ABNORMAL LOW (ref >59)
GFR calc non Af Amer: 19 mL/min/{1.73_m2} — ABNORMAL LOW (ref >59)
Globulin, Total: 2.7 g/dL (ref 1.5–4.5)
Glucose: 67 mg/dL (ref 65–99)
POTASSIUM: 4.7 mmol/L (ref 3.5–5.2)
SODIUM: 140 mmol/L (ref 134–144)
Total Bilirubin: 0.4 mg/dL (ref 0.0–1.2)
Total Protein: 4.8 g/dL — ABNORMAL LOW (ref 6.0–8.5)

## 2014-04-24 LAB — LIPID PANEL
CHOL/HDL RATIO: 1.6 ratio (ref 0.0–4.4)
CHOLESTEROL TOTAL: 122 mg/dL (ref 100–199)
HDL: 74 mg/dL (ref >39)
LDL Calculated: 37 mg/dL (ref 0–99)
TRIGLYCERIDES: 57 mg/dL (ref 0–149)
VLDL CHOLESTEROL CAL: 11 mg/dL (ref 5–40)

## 2014-04-24 LAB — TSH: TSH: 29.61 u[IU]/mL — AB (ref 0.450–4.500)

## 2014-04-24 LAB — PREALBUMIN: PREALBUMIN: 8 mg/dL — AB (ref 20–40)

## 2014-04-24 LAB — VITAMIN B12

## 2014-04-24 NOTE — Telephone Encounter (Signed)
Message copied by Logan Bores on Thu Apr 24, 2014  4:41 PM ------      Message from: Blanchie Serve      Created: Thu Apr 24, 2014 12:27 PM       Your kidney function remains poor but improved from blood work 1 month back. Your thyroid function is completely impaired. Will need to increase your levothyroxine to 50 mcg daily - take it empty stomach in the morning. The nutritional status is poor and wll have you take remeron 15 mg one full tablet daily. Her vitamin b12 level is normal. Do not take vitamin b12 supplement. (remove from med list please). Recheck tsh, free t4 in 4 weeks ------

## 2014-04-25 MED ORDER — LEVOTHYROXINE SODIUM 50 MCG PO TABS: 50.0000 ug | ORAL_TABLET | Freq: Every day | ORAL | Status: DC

## 2014-04-25 NOTE — Telephone Encounter (Signed)
Discussed results with patient's son. [name] verbalized understanding of results. B12 was removed from medication list. Levothyroxine 25 mcg was replaced with 50 mg (rx sent to pharmacy). [name] was given specific instruction- take first thing in the am on empty stomach, glass of water, wait at least 30 minutes before taking any other medications.

## 2014-04-27 ENCOUNTER — Inpatient Hospital Stay (HOSPITAL_COMMUNITY)
Admission: EM | Admit: 2014-04-27 | Discharge: 2014-05-06 | DRG: 252 | Disposition: A | Discharge status: Home / Self Care | Payer: Medicare Other | Attending: Internal Medicine | Admitting: Internal Medicine

## 2014-04-27 ENCOUNTER — Encounter (HOSPITAL_COMMUNITY): Payer: Self-pay | Admitting: Emergency Medicine

## 2014-04-27 ENCOUNTER — Emergency Department (HOSPITAL_COMMUNITY): Payer: Medicare Other

## 2014-04-27 DIAGNOSIS — I10 Essential (primary) hypertension: Secondary | ICD-10-CM

## 2014-04-27 DIAGNOSIS — N2581 Secondary hyperparathyroidism of renal origin: Secondary | ICD-10-CM | POA: Diagnosis present

## 2014-04-27 DIAGNOSIS — Z96649 Presence of unspecified artificial hip joint: Secondary | ICD-10-CM

## 2014-04-27 DIAGNOSIS — R1310 Dysphagia, unspecified: Secondary | ICD-10-CM

## 2014-04-27 DIAGNOSIS — I4891 Unspecified atrial fibrillation: Secondary | ICD-10-CM | POA: Diagnosis present

## 2014-04-27 DIAGNOSIS — Z992 Dependence on renal dialysis: Secondary | ICD-10-CM

## 2014-04-27 DIAGNOSIS — Z85028 Personal history of other malignant neoplasm of stomach: Secondary | ICD-10-CM

## 2014-04-27 DIAGNOSIS — N179 Acute kidney failure, unspecified: Secondary | ICD-10-CM

## 2014-04-27 DIAGNOSIS — Z681 Body mass index (BMI) 19 or less, adult: Secondary | ICD-10-CM

## 2014-04-27 DIAGNOSIS — Z66 Do not resuscitate: Secondary | ICD-10-CM | POA: Diagnosis present

## 2014-04-27 DIAGNOSIS — M949 Disorder of cartilage, unspecified: Secondary | ICD-10-CM

## 2014-04-27 DIAGNOSIS — E162 Hypoglycemia, unspecified: Secondary | ICD-10-CM | POA: Diagnosis present

## 2014-04-27 DIAGNOSIS — Z8673 Personal history of transient ischemic attack (TIA), and cerebral infarction without residual deficits: Secondary | ICD-10-CM

## 2014-04-27 DIAGNOSIS — Z86718 Personal history of other venous thrombosis and embolism: Secondary | ICD-10-CM

## 2014-04-27 DIAGNOSIS — K922 Gastrointestinal hemorrhage, unspecified: Secondary | ICD-10-CM

## 2014-04-27 DIAGNOSIS — I825Z9 Chronic embolism and thrombosis of unspecified deep veins of unspecified distal lower extremity: Secondary | ICD-10-CM | POA: Diagnosis present

## 2014-04-27 DIAGNOSIS — D649 Anemia, unspecified: Secondary | ICD-10-CM

## 2014-04-27 DIAGNOSIS — L89109 Pressure ulcer of unspecified part of back, unspecified stage: Secondary | ICD-10-CM | POA: Diagnosis present

## 2014-04-27 DIAGNOSIS — R634 Abnormal weight loss: Secondary | ICD-10-CM

## 2014-04-27 DIAGNOSIS — K5900 Constipation, unspecified: Secondary | ICD-10-CM

## 2014-04-27 DIAGNOSIS — K219 Gastro-esophageal reflux disease without esophagitis: Secondary | ICD-10-CM

## 2014-04-27 DIAGNOSIS — E785 Hyperlipidemia, unspecified: Secondary | ICD-10-CM | POA: Diagnosis present

## 2014-04-27 DIAGNOSIS — E039 Hypothyroidism, unspecified: Secondary | ICD-10-CM | POA: Diagnosis present

## 2014-04-27 DIAGNOSIS — I120 Hypertensive chronic kidney disease with stage 5 chronic kidney disease or end stage renal disease: Secondary | ICD-10-CM | POA: Diagnosis present

## 2014-04-27 DIAGNOSIS — Z23 Encounter for immunization: Secondary | ICD-10-CM

## 2014-04-27 DIAGNOSIS — R471 Dysarthria and anarthria: Secondary | ICD-10-CM

## 2014-04-27 DIAGNOSIS — I82409 Acute embolism and thrombosis of unspecified deep veins of unspecified lower extremity: Secondary | ICD-10-CM

## 2014-04-27 DIAGNOSIS — F0390 Unspecified dementia without behavioral disturbance: Secondary | ICD-10-CM | POA: Diagnosis present

## 2014-04-27 DIAGNOSIS — L8994 Pressure ulcer of unspecified site, stage 4: Secondary | ICD-10-CM | POA: Diagnosis present

## 2014-04-27 DIAGNOSIS — Z7901 Long term (current) use of anticoagulants: Secondary | ICD-10-CM

## 2014-04-27 DIAGNOSIS — N189 Chronic kidney disease, unspecified: Secondary | ICD-10-CM

## 2014-04-27 DIAGNOSIS — M7989 Other specified soft tissue disorders: Secondary | ICD-10-CM

## 2014-04-27 DIAGNOSIS — M899 Disorder of bone, unspecified: Secondary | ICD-10-CM | POA: Diagnosis present

## 2014-04-27 DIAGNOSIS — N390 Urinary tract infection, site not specified: Secondary | ICD-10-CM

## 2014-04-27 DIAGNOSIS — Z515 Encounter for palliative care: Secondary | ICD-10-CM

## 2014-04-27 DIAGNOSIS — Z87891 Personal history of nicotine dependence: Secondary | ICD-10-CM

## 2014-04-27 DIAGNOSIS — R627 Adult failure to thrive: Secondary | ICD-10-CM | POA: Diagnosis present

## 2014-04-27 DIAGNOSIS — Z79899 Other long term (current) drug therapy: Secondary | ICD-10-CM

## 2014-04-27 DIAGNOSIS — E032 Hypothyroidism due to medicaments and other exogenous substances: Secondary | ICD-10-CM

## 2014-04-27 DIAGNOSIS — R5381 Other malaise: Secondary | ICD-10-CM

## 2014-04-27 DIAGNOSIS — I824Y9 Acute embolism and thrombosis of unspecified deep veins of unspecified proximal lower extremity: Principal | ICD-10-CM | POA: Diagnosis present

## 2014-04-27 DIAGNOSIS — H6121 Impacted cerumen, right ear: Secondary | ICD-10-CM

## 2014-04-27 DIAGNOSIS — R911 Solitary pulmonary nodule: Secondary | ICD-10-CM

## 2014-04-27 DIAGNOSIS — I639 Cerebral infarction, unspecified: Secondary | ICD-10-CM

## 2014-04-27 DIAGNOSIS — R52 Pain, unspecified: Secondary | ICD-10-CM

## 2014-04-27 DIAGNOSIS — E43 Unspecified severe protein-calorie malnutrition: Secondary | ICD-10-CM | POA: Diagnosis present

## 2014-04-27 DIAGNOSIS — N186 End stage renal disease: Secondary | ICD-10-CM | POA: Diagnosis present

## 2014-04-27 DIAGNOSIS — I495 Sick sinus syndrome: Secondary | ICD-10-CM

## 2014-04-27 DIAGNOSIS — R64 Cachexia: Secondary | ICD-10-CM | POA: Diagnosis present

## 2014-04-27 LAB — CBC
HCT: 35.9 % — ABNORMAL LOW (ref 36.0–46.0)
Hemoglobin: 11.3 g/dL — ABNORMAL LOW (ref 12.0–15.0)
MCH: 30.4 pg (ref 26.0–34.0)
MCHC: 31.5 g/dL (ref 30.0–36.0)
MCV: 96.5 fL (ref 78.0–100.0)
PLATELETS: 144 10*3/uL — AB (ref 150–400)
RBC: 3.72 MIL/uL — ABNORMAL LOW (ref 3.87–5.11)
RDW: 15.9 % — AB (ref 11.5–15.5)
WBC: 8.1 10*3/uL (ref 4.0–10.5)

## 2014-04-27 LAB — BASIC METABOLIC PANEL
BUN: 30 mg/dL — ABNORMAL HIGH (ref 6–23)
CO2: 27 mEq/L (ref 19–32)
Calcium: 7.7 mg/dL — ABNORMAL LOW (ref 8.4–10.5)
Chloride: 97 mEq/L (ref 96–112)
Creatinine, Ser: 2.3 mg/dL — ABNORMAL HIGH (ref 0.50–1.10)
GFR, EST AFRICAN AMERICAN: 21 mL/min — AB (ref >90)
GFR, EST NON AFRICAN AMERICAN: 18 mL/min — AB (ref >90)
Glucose, Bld: 81 mg/dL (ref 70–99)
POTASSIUM: 4.3 meq/L (ref 3.7–5.3)
SODIUM: 134 meq/L — AB (ref 137–147)

## 2014-04-27 LAB — PROTIME-INR
INR: 1.59 — ABNORMAL HIGH (ref 0.00–1.49)
Prothrombin Time: 18.5 seconds — ABNORMAL HIGH (ref 11.6–15.2)

## 2014-04-27 LAB — I-STAT TROPONIN, ED: TROPONIN I, POC: 0.03 ng/mL (ref 0.00–0.08)

## 2014-04-27 LAB — PRO B NATRIURETIC PEPTIDE: Pro B Natriuretic peptide (BNP): 3979 pg/mL — ABNORMAL HIGH (ref 0–450)

## 2014-04-27 MED ORDER — PANTOPRAZOLE SODIUM 20 MG PO TBEC
20.0000 mg | DELAYED_RELEASE_TABLET | Freq: Every day | ORAL | Status: DC
  Administered 2014-04-28 – 2014-05-05 (×8): 20 mg via ORAL
  Filled 2014-04-27 (×11): qty 1

## 2014-04-27 MED ORDER — ACETAMINOPHEN 500 MG PO TABS: 500.0000 mg | ORAL_TABLET | Freq: Two times a day (BID) | ORAL | Status: DC | PRN

## 2014-04-27 MED ORDER — ATORVASTATIN CALCIUM 40 MG PO TABS
40.0000 mg | ORAL_TABLET | Freq: Every day | ORAL | Status: DC
  Administered 2014-04-27 – 2014-05-04 (×8): 40 mg via ORAL
  Filled 2014-04-27 (×10): qty 1

## 2014-04-27 MED ORDER — RENA-VITE PO TABS
1.0000 | ORAL_TABLET | Freq: Every day | ORAL | Status: DC
  Administered 2014-04-27 – 2014-04-30 (×4): 1 via ORAL
  Administered 2014-05-01: 23:00:00 via ORAL
  Administered 2014-05-02 – 2014-05-04 (×3): 1 via ORAL
  Filled 2014-04-27 (×11): qty 1

## 2014-04-27 MED ORDER — NEPRO/CARBSTEADY PO LIQD
237.0000 mL | Freq: Every day | ORAL | Status: DC
  Administered 2014-04-29: 237 mL via ORAL

## 2014-04-27 MED ORDER — CALCIUM ACETATE 667 MG PO CAPS
667.0000 mg | ORAL_CAPSULE | Freq: Three times a day (TID) | ORAL | Status: DC
  Administered 2014-04-28 – 2014-05-06 (×14): 667 mg via ORAL
  Filled 2014-04-27 (×28): qty 1

## 2014-04-27 MED ORDER — HEPARIN (PORCINE) IN NACL 100-0.45 UNIT/ML-% IJ SOLN
700.0000 [IU]/h | INTRAMUSCULAR | Status: DC
  Administered 2014-04-27: 700 [IU]/h via INTRAVENOUS
  Filled 2014-04-27: qty 250

## 2014-04-27 MED ORDER — LEVOTHYROXINE SODIUM 25 MCG PO TABS: 25.0000 ug | ORAL_TABLET | Freq: Every day | ORAL | Status: DC

## 2014-04-27 MED ORDER — SODIUM CHLORIDE 0.9 % IJ SOLN
3.0000 mL | Freq: Two times a day (BID) | INTRAMUSCULAR | Status: DC
  Administered 2014-05-01 – 2014-05-06 (×10): 3 mL via INTRAVENOUS

## 2014-04-27 MED ORDER — PRO-STAT SUGAR FREE PO LIQD
30.0000 mL | Freq: Two times a day (BID) | ORAL | Status: DC
  Administered 2014-04-27 – 2014-05-05 (×14): 30 mL via ORAL
  Filled 2014-04-27 (×20): qty 30

## 2014-04-27 MED ORDER — AMIODARONE HCL 100 MG PO TABS
100.0000 mg | ORAL_TABLET | Freq: Every day | ORAL | Status: DC
  Administered 2014-04-27 – 2014-05-05 (×8): 100 mg via ORAL
  Filled 2014-04-27 (×10): qty 1

## 2014-04-27 MED ORDER — LEVOTHYROXINE SODIUM 50 MCG PO TABS
50.0000 ug | ORAL_TABLET | Freq: Every day | ORAL | Status: DC
  Administered 2014-04-28 – 2014-05-06 (×9): 50 ug via ORAL
  Filled 2014-04-27 (×10): qty 1

## 2014-04-27 MED ORDER — MIRTAZAPINE 15 MG PO TABS
15.0000 mg | ORAL_TABLET | Freq: Every day | ORAL | Status: DC
  Administered 2014-04-27 – 2014-05-04 (×8): 15 mg via ORAL
  Filled 2014-04-27 (×10): qty 1

## 2014-04-27 MED ORDER — CARBAMIDE PEROXIDE 6.5 % OT SOLN
5.0000 [drp] | Freq: Two times a day (BID) | OTIC | Status: DC
  Administered 2014-04-28 – 2014-05-05 (×15): 5 [drp] via OTIC
  Filled 2014-04-27: qty 15

## 2014-04-27 MED ORDER — WARFARIN SODIUM 2.5 MG PO TABS
2.5000 mg | ORAL_TABLET | Freq: Once | ORAL | Status: AC
  Administered 2014-04-27: 2.5 mg via ORAL
  Filled 2014-04-27: qty 1

## 2014-04-27 MED ORDER — WARFARIN - PHARMACIST DOSING INPATIENT: Freq: Every day | Status: DC

## 2014-04-27 NOTE — H&P (Signed)
[facility] History and Physical  [name] JKD:[id number] DOB: 1926-03-09 DOA: 04/27/2014  Referring physician: EDP PCP: Robyn Haber, MD   Chief Complaint: Leg swelling   HPI: [name] is a 80 y.o. female who presents with son to ED with BLE swelling for past 3 weeks.  Diagnosed with blood clot in R leg about 3 weeks ago, started on elequis.  Despite elequis her swelling seemed worse to son when he woke her up this morning.  Patient has no CP no SOB.  Does have ESRD dialysis TTS last dialysis yesterday.  Review of Systems: Systems reviewed.  As above, otherwise negative  Past Medical History  Diagnosis Date  . Dementia   . Hypertension   . Ulcer     Hx of  . Stomach cancer   . Snoring   . Fracture, intertrochanteric, left femur 03/25/2012  . Fracture, intertrochanteric, right femur 06/2008    s/p closed reduction and internal fixation  . Nodule of left lung     followed by Dr. Elsworth Soho // LLL spiculated nodule noted per CT in 2012 1.5x1.6x2.4, PET in 06/03/2011 was low uptake.  . Paroxysmal atrial fibrillation   . Tachycardia-bradycardia    Past Surgical History  Procedure Laterality Date  . Stomach surgery      3/4 of stomach removed due to stomach cancer  . Hip surgery  2009    right  . Total hip arthroplasty  03/29/2012    Procedure: TOTAL HIP ARTHROPLASTY;  Surgeon: Johnny Bridge, MD;  Location: [place];  Service: Orthopedics;  Laterality: Left;  Left Hip Hemiarthroplasty  . Esophagogastroduodenoscopy N/A 02/20/2014    Procedure: ESOPHAGOGASTRODUODENOSCOPY (EGD);  Surgeon: Arta Silence, MD;  Location: [facility] ENDOSCOPY;  Service: Endoscopy;  Laterality: N/A;  . Insertion of dialysis catheter Right 02/24/2014    Procedure: INSERTION OF DIALYSIS CATHETER;  Surgeon: Conrad Fontana, MD;  Location: [employer];  Service: Vascular;  Laterality: Right;  . Av fistula placement Right 02/24/2014    Procedure: ARTERIOVENOUS (AV) FISTULA CREATION;  Surgeon: Conrad Frisco, MD;   Location: [place];  Service: Vascular;  Laterality: Right;   Social History:  reports that she quit smoking about 40 years ago. Her smoking use included Cigarettes. She has a .6 pack-year smoking history. She has never used smokeless tobacco. She reports that she drinks alcohol. She reports that she does not use illicit drugs.  No Known Allergies  Family History  Problem Relation Age of Onset  . Cancer Father   . Liver cancer Son   . Allergies       Prior to Admission medications   Medication Sig Start Date End Date Taking? Authorizing Provider  acetaminophen (TYLENOL) 500 MG tablet Take 500 mg by mouth 2 (two) times daily as needed (pain).   Yes Historical Provider, MD  Amino Acids-Protein Hydrolys (FEEDING SUPPLEMENT, PRO-STAT SUGAR FREE 64,) LIQD Take 30 mLs by mouth 2 (two) times daily.   Yes Historical Provider, MD  amiodarone (PACERONE) 200 MG tablet Take 100 mg by mouth daily. 01/07/14  Yes Thompson Grayer, MD  atorvastatin (LIPITOR) 40 MG tablet Take 40 mg by mouth at bedtime.  01/21/14  Yes Thompson Grayer, MD  calcium acetate (PHOSLO) 667 MG capsule Take 1 capsule (667 mg total) by mouth 3 (three) times daily with meals. 03/04/14  Yes Adeline C Viyuoh, MD  levothyroxine (SYNTHROID, LEVOTHROID) 25 MCG tablet Take 25 mcg by mouth daily before breakfast.   Yes Historical Provider, MD  liver oil-zinc  oxide (DESITIN) 40 % ointment Apply 1 application topically daily as needed (wound care/ upper buttocks and left heel).    Yes Historical Provider, MD  mirtazapine (REMERON) 15 MG tablet Take 15 mg by mouth at bedtime.   Yes Historical Provider, MD  multivitamin (RENA-VIT) TABS tablet Take 1 tablet by mouth at bedtime. 03/04/14  Yes Adeline Saralyn Pilar, MD  Nutritional Supplements (FEEDING SUPPLEMENT, NEPRO CARB STEADY,) LIQD Take 237 mLs by mouth daily.   Yes Historical Provider, MD  OVER THE COUNTER MEDICATION Take 40 g by mouth daily. PRO V 60: add 3 oz water to Nepro Carb Steady and mix in 1 scoop of  Pro V 60 and drink daily   Yes Historical Provider, MD  pantoprazole (PROTONIX) 20 MG tablet Take 1 tablet (20 mg total) by mouth daily. 03/04/14  Yes Adeline Saralyn Pilar, MD  carbamide peroxide (DEBROX) 6.5 % otic solution Place 5 drops into both ears 2 (two) times daily. 04/23/14   Blanchie Serve, MD   Physical Exam: Filed Vitals:   04/27/14 1458  BP: 113/51  Pulse: 88  Temp: 98 F (36.7 C)  Resp: 20    BP 113/51  Pulse 88  Temp(Src) 98 F (36.7 C) (Oral)  Resp 20  SpO2 100%  General Appearance:    Alert, oriented, no distress, appears stated age  Head:    Normocephalic, atraumatic  Eyes:    PERRL, EOMI, sclera non-icteric        Nose:   Nares without drainage or epistaxis. Mucosa, turbinates normal  Throat:   Moist mucous membranes. Oropharynx without erythema or exudate.  Neck:   Supple. No carotid bruits.  No thyromegaly.  No lymphadenopathy.   Back:     No CVA tenderness, no spinal tenderness  Lungs:     Clear to auscultation bilaterally, without wheezes, rhonchi or rales  Chest wall:    No tenderness to palpitation  Heart:    Regular rate and rhythm without murmurs, gallops, rubs  Abdomen:     Soft, non-tender, nondistended, normal bowel sounds, no organomegaly  Genitalia:    deferred  Rectal:    deferred  Extremities:   No clubbing, cyanosis or edema.  Pulses:   2+ and symmetric all extremities  Skin:   Skin color, texture, turgor normal, no rashes or lesions  Lymph nodes:   Cervical, supraclavicular, and axillary nodes normal  Neurologic:   CNII-XII intact. Normal strength, sensation and reflexes      throughout    Labs on Admission:  Basic Metabolic Panel:  Recent Labs Lab 04/23/14 1235 04/27/14 1703  NA 140 134*  K 4.7 4.3  CL 100 97  CO2 24 27  GLUCOSE 67 81  BUN 25 30*  CREATININE 2.28* 2.30*  CALCIUM 8.0* 7.7*   Liver Function Tests:  Recent Labs Lab 04/23/14 1235  AST 36  ALT 23  ALKPHOS 132*  BILITOT 0.4  PROT 4.8*   No results found for  this basename: LIPASE, AMYLASE,  in the last 168 hours No results found for this basename: AMMONIA,  in the last 168 hours CBC:  Recent Labs Lab 04/23/14 1235 04/27/14 1703  WBC 5.7 8.1  NEUTROABS 4.0  --   HGB 12.2 11.3*  HCT 39.2 35.9*  MCV 97 96.5  PLT  --  144*   Cardiac Enzymes: No results found for this basename: CKTOTAL, CKMB, CKMBINDEX, TROPONINI,  in the last 168 hours  BNP (last 3 results)  Recent Labs  04/27/14  [place] 3979.0*   CBG: No results found for this basename: GLUCAP,  in the last 168 hours  Radiological Exams on Admission: Dg Chest 2 View  04/27/2014   CLINICAL DATA:  Productive cough  EXAM: CHEST  2 VIEW  COMPARISON:  None.  FINDINGS: The heart size and mediastinal contours are stable. There is no focal infiltrate or pulmonary edema. There are small bilateral pleural effusions. The lungs are hyperinflated. Dual-lumen right central venous line is unchanged. The visualized skeletal structures are stable.  IMPRESSION: No focal pneumonia.  Small bilateral pleural effusions.  Emphysema.   Electronically Signed   By: Abelardo Diesel M.D.   On: 04/27/2014 16:19    EKG: Independently reviewed.  Assessment/Plan Principal Problem:   DVT of leg (deep venous thrombosis) Active Problems:   ESRD on dialysis   1. DVT of legs - Chronic DVT in LLE, and acute to subacute DVT in R.  Arterial waveforms are reduced but present.  EDP talked with vascular surgery who doesn't think this is cerulea phlegmasia alba.  Admitting patient for heparin gtt bridge to coumadin as she has failed eliquis at this point. 2. ESRD on dialysis - TTS, last dialysis on Sat, consult renal tomorrow if patient still in hospital for Tuesday dialysis.   Code Status: Full  Family Communication: Son at bedside Disposition Plan: Admit to inpatient   Time spent: 57 min  [facility] Pager [phone]  If 7AM-7PM, please contact the day team taking care of the  patient [email or web address] Password [facility] 04/27/2014, 8:35 PM

## 2014-04-27 NOTE — Progress Notes (Signed)
VASCULAR LAB PRELIMINARY  PRELIMINARY  PRELIMINARY  PRELIMINARY  Bilateral lower extremity venous and arterial Dopplers completed.    Preliminary report:  There is acute to subacute DVT noted in the right common femoral vein and subacute DVT noted in the right femoral and popliteal veins.  There is chronic DVT noted throughout the left lower extremity.                                    Duplex imaging of the right lower arteries reveal moderate plaque throughout with a 50% stenosis in the mid SFA with monophasic waveforms throughout the thigh and dampened monophasic waveforms in the popliteal and                                       Anterior tibial arteries.  Duplex imaging of the left lower arteries demonstrates moderate plaque throughout with an area of severe plaque noted mid-distal SFA causing a high grade stenosis.  Waveforms are biphasic                                   Throughout the thigh and popliteal and monophasic in the PT.                                Iantha Fallen, RVT 04/27/2014, 7:23 PM

## 2014-04-27 NOTE — ED Notes (Signed)
Pt transported to radiology.

## 2014-04-27 NOTE — Progress Notes (Addendum)
ANTICOAGULATION CONSULT NOTE - Initial Consult  Pharmacy Consult for heparin Indication: Acute and chronic DVT  No Known Allergies  Patient Measurements:   Heparin Dosing Weight: 43.7kg  Vital Signs: Temp: 98 F (36.7 C) (05/17 1458) Temp src: Oral (05/17 1458) BP: 113/51 mmHg (05/17 1458) Pulse Rate: 88 (05/17 1458)  Labs:  Recent Labs  04/27/14 1703  HGB 11.3*  HCT 35.9*  PLT 144*  LABPROT 18.5*  INR 1.59*  CREATININE 2.30*    The CrCl is unknown because both a height and weight (above a minimum accepted value) are required for this calculation.   Medical History: Past Medical History  Diagnosis Date  . Dementia   . Hypertension   . Ulcer     Hx of  . Stomach cancer   . Snoring   . Fracture, intertrochanteric, left femur 03/25/2012  . Fracture, intertrochanteric, right femur 06/2008    s/p closed reduction and internal fixation  . Nodule of left lung     followed by Dr. Elsworth Soho // LLL spiculated nodule noted per CT in 2012 1.5x1.6x2.4, PET in 06/03/2011 was low uptake.  . Paroxysmal atrial fibrillation   . Tachycardia-bradycardia     Medications:  Infusions:  . heparin      Assessment: 69 yof presented to the ED with increasing leg swelling. She was recently started on apixaban for a new DVT. However, pt is ESRD on HD and apixaban hasn't been studied in this population. H/H and plts are slightly low. No overt bleeding noted. INR is elevated which is indicative of the patient taking apixaban but not of therapeutic anticoagulation. To now start IV heparin for acute DVT.   Goal of Therapy:  Heparin level 0.3-0.7 units/ml aPTT 66-102 seconds Monitor platelets by anticoagulation protocol: Yes   Plan:  1. Heparin 700 units/hr - no bolus 2. Check an 8 hour heparin level and aPTT 3. Daily heparin level, aPTT and CBC 4. F/u initiation of oral anticoagulation - most appropriate drug choice for this patient would be warfarin  [name]  [name] 04/27/2014,8:14 PM  Addendum: Now adding warfarin. Had been on 2.5mg  daily except for 1 day without warfarin in the past.   Plan: 1. Warfarin 2.5mg  PO x 1 tonight 2. Daily INR  [name], PharmD, BCPS Pager # [phone] 04/27/2014 8:39 PM

## 2014-04-27 NOTE — ED Notes (Addendum)
Pt reports swelling to bilateral legs for several days, moderate swelling noted. Denies sob. Family reports pt has dementia and has hx of blood clot to right leg. Vss, ekg done at triage. Pt is dialysis pt, last tx was yesterday.

## 2014-04-27 NOTE — Progress Notes (Signed)
Pt admitted to the unit. Pt is alert and oriented. Pt oriented to room, staff, and call bell. Educated on fall safety plan. Bed in lowest position. Full assessment to Epic. Call bell with in reach. Educated to call for assists. Will continue to monitor. Rock Sobol, RN 

## 2014-04-27 NOTE — ED Provider Notes (Addendum)
CSN: [id number]     Arrival date & time 04/27/14  1447 History   First MD Initiated Contact with Patient 04/27/14 1542     Chief Complaint  Patient presents with  . Leg Swelling     (Consider location/radiation/quality/duration/timing/severity/associated sxs/prior Treatment) HPI Comments: Patient presents with son with bilateral lower extremity swelling for the past 3 weeks. She was diagnosed with a blood clot in her right leg about 3 weeks ago and is taking her eliquis. Son states that upon waking this morning he thought the swelling in her right leg was worse. Patient denies any chest pain or shortness of breath. Her last dialysis was yesterday. She denies any headache, neck pain, abdominal pain, nausea vomiting or fever. She denies any focal weakness, numbness or tingling. He states compliance with her eliquis. Notably, patient was admitted in March with GI bleeding and had EGD without any areas of active bleeding. She was taken off her coumadin at that time and apparently started on eliquis about 3 weeks ago.  The history is provided by the patient and a relative. The history is limited by the condition of the patient.    Past Medical History  Diagnosis Date  . Dementia   . Hypertension   . Ulcer     Hx of  . Stomach cancer   . Snoring   . Fracture, intertrochanteric, left femur 03/25/2012  . Fracture, intertrochanteric, right femur 06/2008    s/p closed reduction and internal fixation  . Nodule of left lung     followed by Dr. Elsworth Soho // LLL spiculated nodule noted per CT in 2012 1.5x1.6x2.4, PET in 06/03/2011 was low uptake.  . Paroxysmal atrial fibrillation   . Tachycardia-bradycardia    Past Surgical History  Procedure Laterality Date  . Stomach surgery      3/4 of stomach removed due to stomach cancer  . Hip surgery  2009    right  . Total hip arthroplasty  03/29/2012    Procedure: TOTAL HIP ARTHROPLASTY;  Surgeon: Johnny Bridge, MD;  Location: [place];  Service:  Orthopedics;  Laterality: Left;  Left Hip Hemiarthroplasty  . Esophagogastroduodenoscopy N/A 02/20/2014    Procedure: ESOPHAGOGASTRODUODENOSCOPY (EGD);  Surgeon: Arta Silence, MD;  Location: [facility] ENDOSCOPY;  Service: Endoscopy;  Laterality: N/A;  . Insertion of dialysis catheter Right 02/24/2014    Procedure: INSERTION OF DIALYSIS CATHETER;  Surgeon: Conrad Lassen, MD;  Location: [place];  Service: Vascular;  Laterality: Right;  . Av fistula placement Right 02/24/2014    Procedure: ARTERIOVENOUS (AV) FISTULA CREATION;  Surgeon: Conrad Ranchettes, MD;  Location: [place];  Service: Vascular;  Laterality: Right;   Family History  Problem Relation Age of Onset  . Cancer Father   . Liver cancer Son   . Allergies     History  Substance Use Topics  . Smoking status: Former Smoker -- 0.30 packs/day for 2 years    Types: Cigarettes    Quit date: 12/12/1973  . Smokeless tobacco: Never Used  . Alcohol Use: Yes     Comment: rare   OB History   Grav Para Term Preterm Abortions TAB SAB Ect Mult Living                 Review of Systems  Constitutional: Negative for fever, activity change, appetite change and fatigue.  Eyes: Negative for visual disturbance.  Respiratory: Negative for cough, chest tightness and shortness of breath.   Cardiovascular: Positive for leg swelling.  Gastrointestinal: Negative  for nausea, vomiting and abdominal pain.  Genitourinary: Negative for dysuria and hematuria.  Musculoskeletal: Negative for arthralgias, myalgias, neck pain and neck stiffness.  Skin: Negative for rash.  Neurological: Negative for dizziness, weakness and headaches.  A complete 10 system review of systems was obtained and all systems are negative except as noted in the HPI and PMH.      Allergies  Review of patient's allergies indicates no known allergies.  Home Medications   Prior to Admission medications   Medication Sig Start Date End Date Taking? Authorizing Provider  amiodarone (PACERONE) 200  MG tablet Take 100 mg by mouth daily. 01/07/14   Thompson Grayer, MD  atorvastatin (LIPITOR) 40 MG tablet Take 40 mg by mouth daily at 6 PM.  01/21/14   Thompson Grayer, MD  calcium acetate (PHOSLO) 667 MG capsule Take 1 capsule (667 mg total) by mouth 3 (three) times daily with meals. 03/04/14   Sheila Oats, MD  carbamide peroxide (DEBROX) 6.5 % otic solution Place 5 drops into both ears 2 (two) times daily. 04/23/14   Blanchie Serve, MD  levothyroxine (SYNTHROID, LEVOTHROID) 50 MCG tablet Take 1 tablet (50 mcg total) by mouth daily before breakfast. 04/25/14   Blanchie Serve, MD  liver oil-zinc oxide (DESITIN) 40 % ointment Apply 1 application topically as needed for irritation. For wound care    Historical Provider, MD  mirtazapine (REMERON) 15 MG tablet Take 1 tablet (15 mg total) by mouth at bedtime. Take 1/2 tablet at bedtime for sleep 04/23/14   Blanchie Serve, MD  multivitamin (RENA-VIT) TABS tablet Take 1 tablet by mouth at bedtime. 03/04/14   Sheila Oats, MD  Nutritional Supplements (FEEDING SUPPLEMENT, NEPRO CARB STEADY,) LIQD Take 237 mLs by mouth 2 (two) times daily between meals. 03/04/14   Sheila Oats, MD  Ostomy Supplies (SKIN PREP WIPES) MISC by Does not apply route.    Historical Provider, MD  pantoprazole (PROTONIX) 20 MG tablet Take 1 tablet (20 mg total) by mouth daily. 03/04/14   Sheila Oats, MD   BP 115/52  Pulse 78  Temp(Src) 97.8 F (36.6 C) (Oral)  Resp 18  Ht 5' 1.81" (1.57 m)  Wt 95 lb 6.4 oz (43.273 kg)  BMI 17.56 kg/m2  SpO2 96% Physical Exam  Constitutional: She is oriented to person, place, and time. She appears well-developed and well-nourished. No distress.  HENT:  Head: Normocephalic and atraumatic.  Mouth/Throat: Oropharynx is clear and moist. No oropharyngeal exudate.  Eyes: Conjunctivae and EOM are normal. Pupils are equal, round, and reactive to light.  Neck: Normal range of motion. Neck supple.  Cardiovascular: Normal rate, regular rhythm and  normal heart sounds.   No murmur heard. Pulmonary/Chest: Effort normal and breath sounds normal.  Dialysis catheter right upper chest  Abdominal: Soft. There is no tenderness. There is no rebound and no guarding.  Musculoskeletal: Normal range of motion. She exhibits edema and tenderness.  Pitting tender edema to knees bilaterally unable to palpate DP or PT pulses  Neurological: She is alert and oriented to person, place, and time. No cranial nerve deficit. She exhibits normal muscle tone. Coordination normal.  Skin: Skin is warm.    ED Course  Procedures (including critical care time) Labs Review Labs Reviewed  CBC - Abnormal; Notable for the following:    RBC 3.72 (*)    Hemoglobin 11.3 (*)    HCT 35.9 (*)    RDW 15.9 (*)    Platelets 144 (*)  All other components within normal limits  BASIC METABOLIC PANEL - Abnormal; Notable for the following:    Sodium 134 (*)    BUN 30 (*)    Creatinine, Ser 2.30 (*)    Calcium 7.7 (*)    GFR calc non Af Amer 18 (*)    GFR calc Af Amer 21 (*)    All other components within normal limits  PRO B NATRIURETIC PEPTIDE - Abnormal; Notable for the following:    Pro B Natriuretic peptide (BNP) 3979.0 (*)    All other components within normal limits  PROTIME-INR - Abnormal; Notable for the following:    Prothrombin Time 18.5 (*)    INR 1.59 (*)    All other components within normal limits  MRSA PCR SCREENING  HEPARIN LEVEL (UNFRACTIONATED)  APTT  CBC  PROTIME-INR  BASIC METABOLIC PANEL  I-STAT TROPOININ, ED    Imaging Review Dg Chest 2 View  04/27/2014   CLINICAL DATA:  Productive cough  EXAM: CHEST  2 VIEW  COMPARISON:  None.  FINDINGS: The heart size and mediastinal contours are stable. There is no focal infiltrate or pulmonary edema. There are small bilateral pleural effusions. The lungs are hyperinflated. Dual-lumen right central venous line is unchanged. The visualized skeletal structures are stable.  IMPRESSION: No focal  pneumonia.  Small bilateral pleural effusions.  Emphysema.   Electronically Signed   By: Abelardo Diesel M.D.   On: 04/27/2014 16:19     EKG Interpretation   Date/Time:  Sunday Apr 27 2014 15:02:17 EDT Ventricular Rate:  85 PR Interval:  174 QRS Duration: 106 QT Interval:  406 QTC Calculation: 483 R Axis:   83 Text Interpretation:  Normal sinus rhythm Low voltage QRS Incomplete right  bundle branch block Nonspecific ST and T wave abnormality Abnormal ECG  Artifact No significant change was found Confirmed by Wyvonnia Dusky  MD, Donzella Carrol  [phone]) on 04/27/2014 3:55:55 PM      Preliminary report: There is acute to subacute DVT noted in the right common femoral vein and subacute DVT noted in the right femoral and popliteal veins. There is chronic DVT noted throughout the left lower extremity.  Duplex imaging of the right lower arteries reveal moderate plaque throughout with a 50% stenosis in the mid SFA with monophasic waveforms throughout the thigh and dampened monophasic waveforms in the popliteal and Anterior tibial arteries. Duplex imaging of the left lower arteries demonstrates moderate plaque throughout with an area of severe plaque noted mid-distal SFA causing a high grade stenosis. Waveforms are biphasic  Throughout the thigh and popliteal and monophasic in the PT.   MDM   Final diagnoses:  DVT of leg (deep venous thrombosis)  ESRD on dialysis   patient presents with bilateral leg swelling for the past 3 weeks recently diagnosed with DVT. Son reports increase in leg swelling over the past several days. Recent GI bleed 2 months ago.   Unable to palpate pulses but they are easily found with Doppler. No chest pain or SOB.  Patient has acute DVT in the right common femoral vein. She has chronic DVT in the left leg. Arterial waveforms are reduced bilaterally as above.  Discussed with Pilar Plate of pharmacy patient's current dose of abixaban. he states this could be appropriate dose given her  history of dialysis but data is lacking on appropriate dosing for HD patients.  Ultrasound results discussed with Dr. Kellie Simmering of vascular surgery. He agrees this is considered an eliquis failure. He recommends admission for IV  heparin. He doubts phlegmasia alba dolens as patient does have arterial flow to her lower extremities.  Admission for IV heparin d/w Dr. Alcario Drought. This will be easily titratable in case she develops bleeding again. Hemoglobin today 11.  CTPE cancelled as patient has only been on dialysis x 63month and has no chest pain or SOB.  Ezequiel Essex, MD 04/28/14 0321  Ezequiel Essex, MD 04/28/14 [phone]

## 2014-04-28 DIAGNOSIS — E039 Hypothyroidism, unspecified: Secondary | ICD-10-CM

## 2014-04-28 DIAGNOSIS — I4891 Unspecified atrial fibrillation: Secondary | ICD-10-CM

## 2014-04-28 LAB — CBC
HEMATOCRIT: 30.9 % — AB (ref 36.0–46.0)
HEMOGLOBIN: 9.9 g/dL — AB (ref 12.0–15.0)
MCH: 30.1 pg (ref 26.0–34.0)
MCHC: 32 g/dL (ref 30.0–36.0)
MCV: 93.9 fL (ref 78.0–100.0)
Platelets: 168 10*3/uL (ref 150–400)
RBC: 3.29 MIL/uL — ABNORMAL LOW (ref 3.87–5.11)
RDW: 15.9 % — AB (ref 11.5–15.5)
WBC: 7.9 10*3/uL (ref 4.0–10.5)

## 2014-04-28 LAB — PROTIME-INR
INR: 1.94 — ABNORMAL HIGH (ref 0.00–1.49)
Prothrombin Time: 21.6 seconds — ABNORMAL HIGH (ref 11.6–15.2)

## 2014-04-28 LAB — BASIC METABOLIC PANEL
BUN: 37 mg/dL — ABNORMAL HIGH (ref 6–23)
CHLORIDE: 99 meq/L (ref 96–112)
CO2: 23 meq/L (ref 19–32)
CREATININE: 2.68 mg/dL — AB (ref 0.50–1.10)
Calcium: 7.4 mg/dL — ABNORMAL LOW (ref 8.4–10.5)
GFR calc Af Amer: 18 mL/min — ABNORMAL LOW (ref >90)
GFR calc non Af Amer: 15 mL/min — ABNORMAL LOW (ref >90)
GLUCOSE: 82 mg/dL (ref 70–99)
Potassium: 4.3 mEq/L (ref 3.7–5.3)
Sodium: 133 mEq/L — ABNORMAL LOW (ref 137–147)

## 2014-04-28 LAB — HEPARIN LEVEL (UNFRACTIONATED)
Heparin Unfractionated: 1.7 IU/mL — ABNORMAL HIGH (ref 0.30–0.70)
Heparin Unfractionated: 1.22 IU/mL — ABNORMAL HIGH (ref 0.30–0.70)

## 2014-04-28 LAB — APTT
aPTT: >200 seconds (ref 24–37)
aPTT: 126 seconds — ABNORMAL HIGH (ref 24–37)

## 2014-04-28 LAB — MRSA PCR SCREENING: MRSA by PCR: NEGATIVE

## 2014-04-28 MED ORDER — HEPARIN (PORCINE) IN NACL 100-0.45 UNIT/ML-% IJ SOLN
550.0000 [IU]/h | INTRAMUSCULAR | Status: DC
  Administered 2014-04-28: 550 [IU]/h via INTRAVENOUS
  Filled 2014-04-28: qty 250

## 2014-04-28 MED ORDER — WARFARIN SODIUM 1 MG PO TABS
1.0000 mg | ORAL_TABLET | Freq: Once | ORAL | Status: AC
  Administered 2014-04-28: 1 mg via ORAL
  Filled 2014-04-28: qty 1

## 2014-04-28 MED ORDER — HEPARIN (PORCINE) IN NACL 100-0.45 UNIT/ML-% IJ SOLN
500.0000 [IU]/h | INTRAMUSCULAR | Status: DC
  Filled 2014-04-28: qty 250

## 2014-04-28 MED ORDER — BIOTENE DRY MOUTH MT LIQD
15.0000 mL | Freq: Two times a day (BID) | OROMUCOSAL | Status: DC
  Administered 2014-04-28 – 2014-05-05 (×11): 15 mL via OROMUCOSAL

## 2014-04-28 MED ORDER — PNEUMOCOCCAL VAC POLYVALENT 25 MCG/0.5ML IJ INJ
0.5000 mL | INJECTION | INTRAMUSCULAR | Status: AC
  Administered 2014-04-29: 0.5 mL via INTRAMUSCULAR
  Filled 2014-04-28: qty 0.5

## 2014-04-28 MED ORDER — CHLORHEXIDINE GLUCONATE 0.12 % MT SOLN
15.0000 mL | Freq: Two times a day (BID) | OROMUCOSAL | Status: DC
Start: 2014-04-28 — End: 2014-05-06
  Administered 2014-04-28 – 2014-05-06 (×13): 15 mL via OROMUCOSAL
  Filled 2014-04-28 (×19): qty 15

## 2014-04-28 NOTE — Progress Notes (Signed)
INITIAL NUTRITION ASSESSMENT  DOCUMENTATION CODES Per approved criteria  -Severe malnutrition in the context of chronic illness -Underweight   INTERVENTION: If patient has poor po intake while hospitalized, consider diet liberalization to assist with PO's. Continue Nepro Shake po daily. Continue 30 ml Prostat po BID. RD to continue to follow nutrition care plan.  NUTRITION DIAGNOSIS: Increased nutrient needs related to wound healing and HD as evidenced by estimated needs.   Goal: Intake to meet >90% of estimated nutrition needs.  Monitor:  weight trends, lab trends, I/O's, PO intake, supplement tolerance  Reason for Assessment: Malnutrition Screening Tool  80 y.o. female  Admitting Dx: DVT of leg (deep venous thrombosis)  ASSESSMENT: PMHx significant for dementia, HTN, stomach CA, ESRD (on HD). Admitted with BSE swelling x 3 weeks, recent blood clot in R leg. Work-up reveals DVT.  Patient is pleasantly confused. No family at bedside.  Currently ordered for Nepro Shake PO daily and 30 ml Prostat PO BID. RN currently unable to comment on patient's oral intake - tray is not in room and no meals have been documented. Of note, pt is also ordered for Remeron and Rena-Vit.  Nutrition Focused Physical Exam:   Subcutaneous Fat:  Orbital Region: moderate wasting  Upper Arm Region: severe wasting  Thoracic and Lumbar Region: severe wasting   Muscle:  Temple Region: severe wasting  Clavicle Bone Region: severe wasting  Clavicle and Acromion Bone Region: severe wasting  Scapular Bone Region: n/a  Dorsal Hand: severe wasting  Patellar Region: severe wasting  Anterior Thigh Region: severe wasting Posterior Calf Region: severe wasting   Edema: none  Pt meets criteria for severe MALNUTRITION in the context of chronic illness as evidenced by severe fat and muscle mass loss.  Sodium low at 133 Potassium WNL  Height: Ht Readings from Last 1 Encounters:  04/27/14 5' 1.81"  (1.57 m)    Weight: Wt Readings from Last 1 Encounters:  04/27/14 95 lb 6.4 oz (43.273 kg)   Adjusted body weight using SBW equation: 49 kg   Ideal Body Weight: 105 lb  % Ideal Body Weight: 90%  Wt Readings from Last 10 Encounters:  04/27/14 95 lb 6.4 oz (43.273 kg)  04/23/14 96 lb 6.4 oz (43.727 kg)  04/09/14 100 lb 12.8 oz (45.723 kg)  03/31/14 100 lb (45.36 kg)  03/21/14 103 lb 12.8 oz (47.083 kg)  03/17/14 102 lb (46.267 kg)  03/05/14 98 lb (44.453 kg)  03/03/14 97 lb 10.6 oz (44.3 kg)  03/03/14 97 lb 10.6 oz (44.3 kg)  03/03/14 97 lb 10.6 oz (44.3 kg)    Usual Body Weight: 90 - 100 lb  % Usual Body Weight: 100%  BMI:  Body mass index is 17.56 kg/(m^2). Underweight  Estimated Nutritional Needs: Kcal: 1450 - 1600 Protein: at least 59 g daily Fluid: 1.2 liters  Skin: stage II to R heel  Diet Order: Renal with 1200 ml fluid restriction  EDUCATION NEEDS: -No education needs identified at this time   Intake/Output Summary (Last 24 hours) at 04/28/14 1218 Last data filed at 04/28/14 0805  Gross per 24 hour  Intake      0 ml  Output      0 ml  Net      0 ml    Last BM:  Stage II to R heel   Labs:   Recent Labs Lab 04/23/14 1235 04/27/14 1703 04/28/14 0640  NA 140 134* 133*  K 4.7 4.3 4.3  CL 100 97 99  CO2 24 27 23   BUN 25 30* 37*  CREATININE 2.28* 2.30* 2.68*  CALCIUM 8.0* 7.7* 7.4*  GLUCOSE 67 81 82    CBG (last 3)  No results found for this basename: GLUCAP,  in the last 72 hours  Scheduled Meds: . amiodarone  100 mg Oral Daily  . atorvastatin  40 mg Oral QHS  . calcium acetate  667 mg Oral TID WC  . carbamide peroxide  5 drop Both Ears BID  . feeding supplement (NEPRO CARB STEADY)  237 mL Oral Daily  . feeding supplement (PRO-STAT SUGAR FREE 64)  30 mL Oral BID  . levothyroxine  50 mcg Oral QAC breakfast  . mirtazapine  15 mg Oral QHS  . multivitamin  1 tablet Oral QHS  . pantoprazole  20 mg Oral Daily  . sodium chloride  3 mL  Intravenous Q12H  . warfarin  1 mg Oral ONCE-1800  . Warfarin - Pharmacist Dosing Inpatient   Does not apply q1800    Continuous Infusions: . heparin 550 Units/hr (04/28/14 9147)    Past Medical History  Diagnosis Date  . Dementia   . Hypertension   . Ulcer     Hx of  . Stomach cancer   . Snoring   . Fracture, intertrochanteric, left femur 03/25/2012  . Fracture, intertrochanteric, right femur 06/2008    s/p closed reduction and internal fixation  . Nodule of left lung     followed by Dr. Elsworth Soho // LLL spiculated nodule noted per CT in 2012 1.5x1.6x2.4, PET in 06/03/2011 was low uptake.  . Paroxysmal atrial fibrillation   . Tachycardia-bradycardia     Past Surgical History  Procedure Laterality Date  . Stomach surgery      3/4 of stomach removed due to stomach cancer  . Hip surgery  2009    right  . Total hip arthroplasty  03/29/2012    Procedure: TOTAL HIP ARTHROPLASTY;  Surgeon: Johnny Bridge, MD;  Location: [facility];  Service: Orthopedics;  Laterality: Left;  Left Hip Hemiarthroplasty  . Esophagogastroduodenoscopy N/A 02/20/2014    Procedure: ESOPHAGOGASTRODUODENOSCOPY (EGD);  Surgeon: Arta Silence, MD;  Location[facility] ENDOSCOPY;  Service: Endoscopy;  Laterality: N/A;  . Insertion of dialysis catheter Right 02/24/2014    Procedure: INSERTION OF DIALYSIS CATHETER;  Surgeon: Conrad St. [place], MD;  Location: [facility];  Service: Vascular;  Laterality: Right;  . Av fistula placement Right 02/24/2014    Procedure: ARTERIOVENOUS (AV) FISTULA CREATION;  Surgeon: Conrad [place], MD;  Location: [facility];  Service: Vascular;  Laterality: Right;    Inda Coke MS, RD, LDN Inpatient Registered Dietitian Pager: [phone] After-hours pager: [phone]

## 2014-04-28 NOTE — Progress Notes (Signed)
[place] for heparin and warfarin Indication: Acute and chronic DVT  No Known Allergies  Patient Measurements: Height: 5' 1.81" (157 cm) Weight: 95 lb 6.4 oz (43.273 kg) IBW/kg (Calculated) : 49.67 Heparin Dosing Weight: 43kg  Vital Signs: Temp: 98.1 F (36.7 C) (05/18 0500) Temp src: Oral (05/18 0500) BP: 142/44 mmHg (05/18 0500) Pulse Rate: 92 (05/18 0500)  Labs:  Recent Labs  04/27/14 1703 04/28/14 0640  HGB 11.3* 9.9*  HCT 35.9* 30.9*  PLT 144* 168  APTT  --  >200*  LABPROT 18.5* 21.6*  INR 1.59* 1.94*  HEPARINUNFRC  --  1.70*  CREATININE 2.30* 2.68*    Estimated Creatinine Clearance: 10.5 ml/min (by C-G formula based on Cr of 2.68).  Medications:  Heparin @ 700 units/hr  . amiodarone  100 mg Oral Daily  . atorvastatin  40 mg Oral QHS  . calcium acetate  667 mg Oral TID WC  . carbamide peroxide  5 drop Both Ears BID  . feeding supplement (NEPRO CARB STEADY)  237 mL Oral Daily  . feeding supplement (PRO-STAT SUGAR FREE 64)  30 mL Oral BID  . levothyroxine  50 mcg Oral QAC breakfast  . mirtazapine  15 mg Oral QHS  . multivitamin  1 tablet Oral QHS  . pantoprazole  20 mg Oral Daily  . sodium chloride  3 mL Intravenous Q12H  . Warfarin - Pharmacist Dosing Inpatient   Does not apply q1800     Assessment: 81 yof presented to the ED with increasing leg swelling. She was recently started on apixaban for a new DVT. However, pt is ESRD on HD and apixaban hasn't been studied in this population. Apixaban has been discontinued and she was started on heparin drip + warfarin. Her INR this morning increased to 1.94 (from 1.5, pretty sizable increase). Heparin level was elevated at 1.7 units/mL- this is possibly reflective of apixaban. APTT was also elevated (>200 seconds). It is possible the apixaban will take longer to clear in this ESRD patient (last HD on Saturday as an outpatient). Hgb dropped to 9.9, platelets are stable. No  bleeding noted.  Goal of Therapy:  Heparin level 0.3-0.7 units/ml aPTT 66-102 seconds Monitor platelets by anticoagulation protocol: Yes   Plan:  1. Hold heparin for 1 hour (communicated to RN) 2. Resume heparin at 550 units/hr at 0930 3. Check an 8 hour heparin level and aPTT (1730) 4. Warfarin 1mg  po x1 tonight 5. Daily heparin level, aPTT and CBC 6. Follow for s/s bleeding  Larhonda Dettloff D. Manus Weedman, PharmD, BCPS Clinical Pharmacist Pager: [phone] 04/28/2014 8:20 AM

## 2014-04-28 NOTE — Progress Notes (Signed)
CRITICAL VALUE ALERT  Critical value received:  PTT >200 seconds  Date of notification:  04/28/14  Time of notification:  0758  Critical value read back:yes  Nurse who received alert:  Larena Glassman, RN  MD notified (1st page):  Dr. Carles Collet  Time of first page:  [phone]   Responding MD:  Dr. Carles Collet  Time MD responded:  [phone]    Orders received to stop heparin drip for 1 hour or until pharmacy gives further instructions. Will monitor.  Anella Nakata Donnie Aho, RN

## 2014-04-28 NOTE — Progress Notes (Signed)
ANTICOAGULANT CONSULT NOTE: Heparin  Indication: Acute and chronic DVT  Heparin level: 1.22 Goal heparin level: 0.3-0.7 APTT: 126 Goal APTT: 66-102 sec  The pt's previous use of apixaban continues to influence the heparin level. The APTT remains above the goal range also. Will have the nurse hold the heparin for an hour and restart the heparin drip at 500 units/hr. Will follow up levels with AM labs.  Arrie Senate, PharmD

## 2014-04-28 NOTE — Consult Note (Signed)
WOC wound consult note Reason for Consult: Consult requested for sacrum and heels.  Pt with multiple systemic risk factors; incontinent, immobile, emaciated.  Wound type: Sacrum with stage 4 wound, exposed bone Pressure Ulcer POA: Yes to all locations. Measurement:2X1X.1cm Wound bed: 10% yellow, 90% red Drainage (amount, consistency, odor) Mod amt tan drainage.  No odor from wound but pt has strong foul odor of unknown etiology. Periwound: Intact skin surrounding.Left heel with stage 1: 5X5cm, red and does not blanch Right heel with stage 1: 5X5cm, red and does not blanch.  Stage 2 wound in the middle; 2X1.5X.1cm, pink and moist. Dressing procedure/placement/frequency: Float heels to reduce pressure.  Foam dressing to right heel to promote healing.  Aquacel to absorb drainage and provide antimicrobial benefits to sacrum wound. Please re-consult if further assistance is needed.  Gae Dry MSN, RN, [place], [place], [place].

## 2014-04-28 NOTE — Progress Notes (Signed)
PROGRESS NOTE  [name] CNO:[id number] DOB: 04/18/1926 DOA: 04/27/2014 PCP: Robyn Haber, MD  Interim summary 80 year old female with a history of dementia, hypertension, atrial fibrillation, ESRD, stroke presents with worsening bilateral lower extremity edema. The patient was diagnosed with a right lower extremity DVT approximately 3 weeks prior to this admission. However, the actual report was not available. According to the son, there was worsening right lower extremity edema on the day of admission. Venous duplex of the lower extremity showed acute to subacute DVT of the right common femoral vein with subacute DVT of the right femoral and popliteal vein as well as chronic DVT in the left lower extremity. The patient was started on apixiban approximately on 04/10/2014. She was admitted 3/10-3/24 due to worsening anemia (Hgb 6.2) in the setting of UGI bleed and progressive renal failure (Cr 7). She was transfused with PRBCs. She was guaiac positive and underwent EGD with GI that was unrevealing. Anemia was felt to be multifactorial from slow GI losses in setting of coagulopathy (INR was 5.9 on admit), renal disease, malabsorption from prior GI surgery. GI recommended that the patient remain off of coumadin for at least 2 weeks and asked that risks vs benefits be weighed prior to resuming. She had AVF placed during her stay and hemodialysis was started. She was seen by Palliative Care and Full Code Status was continued. She was d/c to [facility].    Assessment/Plan: DVT bilateral lower extremities -Venous duplex shows acute to subacute DVT right common femoral vein, subacute DVT a right femoral and popliteal vein; chronic DVT left lower extremity -According to medical record, patient was started on apixiban approximately 04/10/2014 -Attempt to contact the patient's PCP--left message to call back -Attempted to contact the patient's son--no answer; no ability to leave  voicemail -Do not believe this represents failure of apixiban although we'll need to clarify compliance and previous duplex results -Suspect that this represents continued progression/evolution of the patient's previously diagnosed DVT -As the patient has history of recent GI bleed and ESRD, hesitate to restart apixiban -Will need to have long discussion with the patient's son regarding risks benefits of long-term anticoagulation -No present evidence of phlegmasia alba on physical examination--extremities are warm with popliteal pulses present bilateral -Continue heparin drip until further information is obtained ESRD -Consult nephrology for maintenance dialysis -Dialyzes Tuesday, Thursday, Saturday Paroxysmal atrial fibrillation -CHADS-VASc=6 -Anticoagulation previously discontinued due to GI bleed with hemoglobin down to 6 -Presently in sinus -Continue amiodarone Hypothyroidism -Continue Synthroid Hyperlipidemia -Continue statin  Family Communication:  Tried to call son x2, no ability to leave voicemail Disposition Plan:   Home when medically stable       Procedures/Studies: Dg Chest 2 View  04/27/2014   CLINICAL DATA:  Productive cough  EXAM: CHEST  2 VIEW  COMPARISON:  None.  FINDINGS: The heart size and mediastinal contours are stable. There is no focal infiltrate or pulmonary edema. There are small bilateral pleural effusions. The lungs are hyperinflated. Dual-lumen right central venous line is unchanged. The visualized skeletal structures are stable.  IMPRESSION: No focal pneumonia.  Small bilateral pleural effusions.  Emphysema.   Electronically Signed   By: Abelardo Diesel M.D.   On: 04/27/2014 16:19         Subjective: Patient is pleasantly confused. No reports of chest pain, shortness of breath, respiratory distress, vomiting, diarrhea  Objective: Filed Vitals:   04/27/14 2045 04/27/14 2100 04/27/14 2148 04/28/14 0500  BP: 131/51 109/72 115/52 142/44  Pulse: 82  81 78 92  Temp:   97.8 F (36.6 C) 98.1 F (36.7 C)  TempSrc:   Oral Oral  Resp:   18 18  Height:   5' 1.81" (1.57 m)   Weight:   43.273 kg (95 lb 6.4 oz)   SpO2: 97% 97% 96% 92%   No intake or output data in the 24 hours ending 04/28/14 0853 Weight change:  Exam:   General:  Pt is alert, follows commands appropriately, not in acute distress  HEENT: No icterus, No thrush,  [place]/AT  Cardiovascular: RRR, S1/S2, no rubs, no gallops  Respiratory: Bibasilar crackles. No wheezing. Good air movement.  Abdomen: Soft/+BS, non tender, non distended, no guarding  Extremities: 2+ edema, No lymphangitis, No petechiae, No rashes, no synovitis; right popliteal pulse palpable, left dorsalis pedis pulse palpable bilaterally feet are warm to touch  Data Reviewed: Basic Metabolic Panel:  Recent Labs Lab 04/23/14 1235 04/27/14 1703 04/28/14 0640  NA 140 134* 133*  K 4.7 4.3 4.3  CL 100 97 99  CO2 24 27 23   GLUCOSE 67 81 82  BUN 25 30* 37*  CREATININE 2.28* 2.30* 2.68*  CALCIUM 8.0* 7.7* 7.4*   Liver Function Tests:  Recent Labs Lab 04/23/14 1235  AST 36  ALT 23  ALKPHOS 132*  BILITOT 0.4  PROT 4.8*   No results found for this basename: LIPASE, AMYLASE,  in the last 168 hours No results found for this basename: AMMONIA,  in the last 168 hours CBC:  Recent Labs Lab 04/23/14 1235 04/27/14 1703 04/28/14 0640  WBC 5.7 8.1 7.9  NEUTROABS 4.0  --   --   HGB 12.2 11.3* 9.9*  HCT 39.2 35.9* 30.9*  MCV 97 96.5 93.9  PLT  --  144* 168   Cardiac Enzymes: No results found for this basename: CKTOTAL, CKMB, CKMBINDEX, TROPONINI,  in the last 168 hours BNP: No components found with this basename: POCBNP,  CBG: No results found for this basename: GLUCAP,  in the last 168 hours  Recent Results (from the past 240 hour(s))  MRSA PCR SCREENING     Status: None   Collection Time    04/27/14  9:57 PM      Result Value Ref Range Status   MRSA by PCR NEGATIVE  NEGATIVE Final    Comment:            The GeneXpert MRSA Assay (FDA     approved for NASAL specimens     only), is one component of a     comprehensive MRSA colonization     surveillance program. It is not     intended to diagnose MRSA     infection nor to guide or     monitor treatment for     MRSA infections.     Scheduled Meds: . amiodarone  100 mg Oral Daily  . atorvastatin  40 mg Oral QHS  . calcium acetate  667 mg Oral TID WC  . carbamide peroxide  5 drop Both Ears BID  . feeding supplement (NEPRO CARB STEADY)  237 mL Oral Daily  . feeding supplement (PRO-STAT SUGAR FREE 64)  30 mL Oral BID  . levothyroxine  50 mcg Oral QAC breakfast  . mirtazapine  15 mg Oral QHS  . multivitamin  1 tablet Oral QHS  . pantoprazole  20 mg Oral Daily  . sodium chloride  3 mL Intravenous Q12H  . warfarin  1 mg Oral ONCE-1800  . Warfarin - Pharmacist Dosing Inpatient   Does not apply q1800   Continuous Infusions: . heparin       Orson Eva, DO  [facility] Pager [phone]  If 7PM-7AM, please contact night-coverage [email or web address] Password [email or web address] 04/28/2014, 8:53 AM   LOS: 1 day

## 2014-04-28 NOTE — Progress Notes (Signed)
UR completed 

## 2014-04-29 LAB — BASIC METABOLIC PANEL
BUN: 50 mg/dL — AB (ref 6–23)
CHLORIDE: 98 meq/L (ref 96–112)
CO2: 24 mEq/L (ref 19–32)
Calcium: 7.3 mg/dL — ABNORMAL LOW (ref 8.4–10.5)
Creatinine, Ser: 3.4 mg/dL — ABNORMAL HIGH (ref 0.50–1.10)
GFR calc non Af Amer: 11 mL/min — ABNORMAL LOW (ref >90)
GFR, EST AFRICAN AMERICAN: 13 mL/min — AB (ref >90)
GLUCOSE: 85 mg/dL (ref 70–99)
Potassium: 4.3 mEq/L (ref 3.7–5.3)
Sodium: 131 mEq/L — ABNORMAL LOW (ref 137–147)

## 2014-04-29 LAB — CBC
HCT: 29.6 % — ABNORMAL LOW (ref 36.0–46.0)
Hemoglobin: 9.5 g/dL — ABNORMAL LOW (ref 12.0–15.0)
MCH: 30.3 pg (ref 26.0–34.0)
MCHC: 32.1 g/dL (ref 30.0–36.0)
MCV: 94.3 fL (ref 78.0–100.0)
PLATELETS: 181 10*3/uL (ref 150–400)
RBC: 3.14 MIL/uL — ABNORMAL LOW (ref 3.87–5.11)
RDW: 16.1 % — AB (ref 11.5–15.5)
WBC: 8.9 10*3/uL (ref 4.0–10.5)

## 2014-04-29 LAB — PROTIME-INR
INR: 1.48 (ref 0.00–1.49)
Prothrombin Time: 17.5 seconds — ABNORMAL HIGH (ref 11.6–15.2)

## 2014-04-29 LAB — HEPARIN LEVEL (UNFRACTIONATED): Heparin Unfractionated: 0.64 IU/mL (ref 0.30–0.70)

## 2014-04-29 LAB — APTT: aPTT: 97 seconds — ABNORMAL HIGH (ref 24–37)

## 2014-04-29 MED ORDER — DARBEPOETIN ALFA-POLYSORBATE 40 MCG/0.4ML IJ SOLN
INTRAMUSCULAR | Status: AC
  Filled 2014-04-29: qty 0.4

## 2014-04-29 MED ORDER — CALCITRIOL 0.5 MCG PO CAPS
0.5000 ug | ORAL_CAPSULE | ORAL | Status: DC
  Administered 2014-05-01 – 2014-05-03 (×2): 0.5 ug via ORAL
  Filled 2014-04-29 (×5): qty 1

## 2014-04-29 MED ORDER — SODIUM CHLORIDE 0.9 % IV SOLN: 100.0000 mL | INTRAVENOUS | Status: DC | PRN

## 2014-04-29 MED ORDER — NEPRO/CARBSTEADY PO LIQD: 237.0000 mL | ORAL | Status: DC | PRN

## 2014-04-29 MED ORDER — DARBEPOETIN ALFA-POLYSORBATE 40 MCG/0.4ML IJ SOLN
40.0000 ug | INTRAMUSCULAR | Status: DC
  Administered 2014-04-29: 40 ug via INTRAVENOUS
  Filled 2014-04-29: qty 0.4

## 2014-04-29 MED ORDER — WARFARIN SODIUM 2 MG PO TABS
2.0000 mg | ORAL_TABLET | Freq: Once | ORAL | Status: AC
  Administered 2014-04-29: 2 mg via ORAL
  Filled 2014-04-29: qty 1

## 2014-04-29 MED ORDER — HEPARIN (PORCINE) IN NACL 100-0.45 UNIT/ML-% IJ SOLN
450.0000 [IU]/h | INTRAMUSCULAR | Status: DC
  Administered 2014-04-29: 450 [IU]/h via INTRAVENOUS
  Filled 2014-04-29: qty 250

## 2014-04-29 MED ORDER — PENTAFLUOROPROP-TETRAFLUOROETH EX AERO: 1.0000 "application " | INHALATION_SPRAY | CUTANEOUS | Status: DC | PRN

## 2014-04-29 MED ORDER — LIDOCAINE HCL (PF) 1 % IJ SOLN: 5.0000 mL | INTRAMUSCULAR | Status: DC | PRN

## 2014-04-29 MED ORDER — ALTEPLASE 2 MG IJ SOLR
2.0000 mg | Freq: Once | INTRAMUSCULAR | Status: AC | PRN
  Filled 2014-04-29: qty 2

## 2014-04-29 MED ORDER — HEPARIN SODIUM (PORCINE) 1000 UNIT/ML DIALYSIS
1000.0000 [IU] | INTRAMUSCULAR | Status: DC | PRN
  Filled 2014-04-29: qty 1

## 2014-04-29 MED ORDER — LIDOCAINE-PRILOCAINE 2.5-2.5 % EX CREA: 1.0000 "application " | TOPICAL_CREAM | CUTANEOUS | Status: DC | PRN

## 2014-04-29 NOTE — Clinical Documentation Improvement (Signed)
Possible Clinical Conditions? Severe Malnutrition   Protein Calorie Malnutrition Severe Protein Calorie Malnutrition Other Condition Cannot clinically determine  Supporting Information:Nutritional Assessment by Erlene Quan, RD at 05-22-2014 12:18 PM DOCUMENTATION CODES  Per approved criteria  -Severe malnutrition in the context of chronic illness  -Underweight  Risk Factors:Admitting Dx: DVT of leg PMHx significant for dementia, HTN, stomach CA, ESRD (on HD).  Signs & Symptoms: BMI=17.56 kg/(m^2).  INTERVENTION: If patient has poor po intake while hospitalized, consider diet liberalization to assist with PO's. Continue Nepro Shake po daily. Continue 30 ml Prostat po BID. RD to continue to follow nutrition care plan.  Thank You, Alessandra Grout, RN, BSN, CCDS, Clinical Documentation Specialist:  [phone]   [phone]=[employer]- Health Information Management

## 2014-04-29 NOTE — Consult Note (Addendum)
Indication for Consultation:  Management of ESRD/hemodialysis; anemia, hypertension/volume and secondary hyperparathyroidism  HPI: [name] is a 80 y.o. female admitted for treatment of LE DVT. She receives HD TTS at [facility], history of HTN, afib, stroke and dementia. She presented to the ED with worsening LE edema and venous duplex of the lower extremity showed acute to subacute DVT of the right common femoral vein with subacute DVT of the right femoral and popliteal vein as well as chronic DVT in the left lower extremity. She had previously been on coumadin, then changed to eliquis in April. She was admitted in March for GIB/anemia and required blood transfusion at that time during that admission HD was initiated and she had an AVF placed. Will keep on schedule for HD today.  Chart review: Jul 2009-- R hip fracture after fall, had ORIF, post op anemia, d/c'd to SNF. Other dx's HTN, mild dementia, hx L4 comp fx w vertebroplasty, osteoporosis, hx gastric cancer\ Apr 2013-- mechanical fall w L hip fracture, had L THR on 4/18. On Namenda and Aricept for dementia.  CKD Cr 1.76. Family met with palliative care re: [place] given pt's dementia. At the time the son wanted pt to make all her own decisions. D/C'd to SNF.  Sept 2014-- slurred speech with acute left MCA TIA vs CVA on MRI. Sig dysphagia on swallow study. RLE and RUE paresis from CVA. Afib w RVR. Tube feeds. Improved and dc'd to CIR on coumadin Oct 2014-- Rehab stay w improved R arm and leg weakness. D/C'd home.  Mar 2015-- melena, anemia, Hb 6.2 , EGD no source of bleed, transfused and coumadin held x 2 wks per GI rec and further AC to be decided by cardiology. Had R AVF and tunneled HD cath placed and was started on dialysis, dc'd on TTS OP schedule.   Past Medical History  Diagnosis Date  . Dementia   . Hypertension   . Ulcer     Hx of  . Stomach cancer   . Snoring   . Fracture, intertrochanteric, left femur 03/25/2012  . Fracture,  intertrochanteric, right femur 06/2008    s/p closed reduction and internal fixation  . Nodule of left lung     followed by Dr. Elsworth Soho // LLL spiculated nodule noted per CT in 2012 1.5x1.6x2.4, PET in 06/03/2011 was low uptake.  . Paroxysmal atrial fibrillation   . Tachycardia-bradycardia    Past Surgical History  Procedure Laterality Date  . Stomach surgery      3/4 of stomach removed due to stomach cancer  . Hip surgery  2009    right  . Total hip arthroplasty  03/29/2012    Procedure: TOTAL HIP ARTHROPLASTY;  Surgeon: Johnny Bridge, MD;  Location: [place];  Service: Orthopedics;  Laterality: Left;  Left Hip Hemiarthroplasty  . Esophagogastroduodenoscopy N/A 02/20/2014    Procedure: ESOPHAGOGASTRODUODENOSCOPY (EGD);  Surgeon: Arta Silence, MD;  Location: [facility] ENDOSCOPY;  Service: Endoscopy;  Laterality: N/A;  . Insertion of dialysis catheter Right 02/24/2014    Procedure: INSERTION OF DIALYSIS CATHETER;  Surgeon: Conrad Parcelas Penuelas, MD;  Location: [place];  Service: Vascular;  Laterality: Right;  . Av fistula placement Right 02/24/2014    Procedure: ARTERIOVENOUS (AV) FISTULA CREATION;  Surgeon: Conrad [place], MD;  Location: [place];  Service: Vascular;  Laterality: Right;   Family History  Problem Relation Age of Onset  . Cancer Father   . Liver cancer Son   . Allergies     Social  History:  reports that she quit smoking about 40 years ago. Her smoking use included Cigarettes. She has a .6 pack-year smoking history. She has never used smokeless tobacco. She reports that she drinks alcohol. She reports that she does not use illicit drugs. No Known Allergies Prior to Admission medications   Medication Sig Start Date End Date Taking? Authorizing Provider  acetaminophen (TYLENOL) 500 MG tablet Take 500 mg by mouth 2 (two) times daily as needed (pain).   Yes Historical Provider, MD  Amino Acids-Protein Hydrolys (FEEDING SUPPLEMENT, PRO-STAT SUGAR FREE 64,) LIQD Take 30 mLs by mouth 2 (two) times daily.    Yes Historical Provider, MD  amiodarone (PACERONE) 200 MG tablet Take 100 mg by mouth daily. 01/07/14  Yes Thompson Grayer, MD  atorvastatin (LIPITOR) 40 MG tablet Take 40 mg by mouth at bedtime.  01/21/14  Yes Thompson Grayer, MD  calcium acetate (PHOSLO) 667 MG capsule Take 1 capsule (667 mg total) by mouth 3 (three) times daily with meals. 03/04/14  Yes Adeline Saralyn Pilar, MD  levothyroxine (SYNTHROID, LEVOTHROID) 25 MCG tablet Take 25 mcg by mouth daily before breakfast.   Yes Historical Provider, MD  liver oil-zinc oxide (DESITIN) 40 % ointment Apply 1 application topically daily as needed (wound care/ upper buttocks and left heel).    Yes Historical Provider, MD  mirtazapine (REMERON) 15 MG tablet Take 15 mg by mouth at bedtime.   Yes Historical Provider, MD  multivitamin (RENA-VIT) TABS tablet Take 1 tablet by mouth at bedtime. 03/04/14  Yes Adeline Saralyn Pilar, MD  Nutritional Supplements (FEEDING SUPPLEMENT, NEPRO CARB STEADY,) LIQD Take 237 mLs by mouth daily.   Yes Historical Provider, MD  OVER THE COUNTER MEDICATION Take 40 g by mouth daily. PRO V 60: add 3 oz water to Nepro Carb Steady and mix in 1 scoop of Pro V 60 and drink daily   Yes Historical Provider, MD  pantoprazole (PROTONIX) 20 MG tablet Take 1 tablet (20 mg total) by mouth daily. 03/04/14  Yes Adeline Saralyn Pilar, MD  carbamide peroxide (DEBROX) 6.5 % otic solution Place 5 drops into both ears 2 (two) times daily. 04/23/14   Blanchie Serve, MD   Current Facility-Administered Medications  Medication Dose Route Frequency Provider Last Rate Last Dose  . acetaminophen (TYLENOL) tablet 500 mg  500 mg Oral BID PRN Etta Quill, DO      . amiodarone (PACERONE) tablet 100 mg  100 mg Oral Daily Etta Quill, DO   100 mg at 04/29/14 1032  . antiseptic oral rinse (BIOTENE) solution 15 mL  15 mL Mouth Rinse q12n4p Orson Eva, MD   15 mL at 04/29/14 1327  . atorvastatin (LIPITOR) tablet 40 mg  40 mg Oral QHS Etta Quill, DO   40 mg at 04/28/14  2159  . calcitRIOL (ROCALTROL) capsule 0.5 mcg  0.5 mcg Oral Q T,Th,Sat-1800 Marlena Clipper, NP      . calcium acetate (PHOSLO) capsule 667 mg  667 mg Oral TID WC Etta Quill, DO   667 mg at 04/29/14 1322  . carbamide peroxide (DEBROX) 6.5 % otic solution 5 drop  5 drop Both Ears BID Etta Quill, DO   5 drop at 04/29/14 1035  . chlorhexidine (PERIDEX) 0.12 % solution 15 mL  15 mL Mouth Rinse BID Orson Eva, MD   15 mL at 04/29/14 1033  . feeding supplement (NEPRO CARB STEADY) liquid 237 mL  237 mL Oral Daily Etta Quill, DO  237 mL at 04/29/14 1034  . feeding supplement (PRO-STAT SUGAR FREE 64) liquid 30 mL  30 mL Oral BID Etta Quill, DO   30 mL at 04/29/14 1034  . heparin ADULT infusion 100 units/mL (25000 units/250 mL)  450 Units/hr Intravenous Continuous Saundra Shelling, RPH 4.5 mL/hr at 04/29/14 1035 450 Units/hr at 04/29/14 1035  . levothyroxine (SYNTHROID, LEVOTHROID) tablet 50 mcg  50 mcg Oral QAC breakfast Etta Quill, DO   50 mcg at 04/29/14 0553  . mirtazapine (REMERON) tablet 15 mg  15 mg Oral QHS Etta Quill, DO   15 mg at 04/28/14 2159  . multivitamin (RENA-VIT) tablet 1 tablet  1 tablet Oral QHS Etta Quill, DO   1 tablet at 04/28/14 2159  . pantoprazole (PROTONIX) EC tablet 20 mg  20 mg Oral Daily Etta Quill, DO   20 mg at 04/29/14 1033  . sodium chloride 0.9 % injection 3 mL  3 mL Intravenous Q12H Etta Quill, DO      . warfarin (COUMADIN) tablet 2 mg  2 mg Oral ONCE-1800 Thuy Polo, [facility]      . Warfarin - Pharmacist Dosing Inpatient   Does not apply [place], [facility]       Labs: Basic Metabolic Panel:  Recent Labs Lab 04/23/14 1235 04/27/14 1703 04/28/14 0640 04/29/14 0542  NA 140 134* 133* 131*  K 4.7 4.3 4.3 4.3  CL 100 97 99 98  CO2 '24 27 23 24  ' GLUCOSE 67 81 82 85  BUN 25 30* 37* 50*  CREATININE 2.28* 2.30* 2.68* 3.40*  CALCIUM 8.0* 7.7* 7.4* 7.3*  ALB 2.1*  --   --   --    Liver Function  Tests:  Recent Labs Lab 04/23/14 1235  AST 36  ALT 23  ALKPHOS 132*  BILITOT 0.4  PROT 4.8*   No results found for this basename: LIPASE, AMYLASE,  in the last 168 hours No results found for this basename: AMMONIA,  in the last 168 hours CBC:  Recent Labs Lab 04/23/14 1235 04/27/14 1703 04/28/14 0640 04/29/14 0542  WBC 5.7 8.1 7.9 8.9  NEUTROABS 4.0  --   --   --   HGB 12.2 11.3* 9.9* 9.5*  HCT 39.2 35.9* 30.9* 29.6*  MCV 97 96.5 93.9 94.3  PLT  --  144* 168 181   Cardiac Enzymes: No results found for this basename: CKTOTAL, CKMB, CKMBINDEX, TROPONINI,  in the last 168 hours CBG: No results found for this basename: GLUCAP,  in the last 168 hours [place] Studies: No results found for this basename: IRON, TIBC, TRANSFERRIN, FERRITIN,  in the last 72 hours Studies/Results: No results found.   Review of Systems: Pt answers yes to all questions, history of dementia. Unable to accurately obtain ROS   Physical Exam: Filed Vitals:   04/28/14 1730 04/28/14 2203 04/29/14 0542 04/29/14 0900  BP: 124/54 131/49 130/40 137/43  Pulse: 89 75 84 78  Temp: 99.1 F (37.3 C) 99.1 F (37.3 C) 99.5 F (37.5 C) 98.9 F (37.2 C)  TempSrc: Oral   Oral  Resp: '16 15 17 18  ' Height:      Weight:      SpO2: 93% 95% 93% 94%     General: Frail, in no acute distress. Head: Normocephalic, atraumatic, sclera non-icteric, mucus membranes are moist Neck: Supple. JVD not elevated. Lungs: Mostly clear with few/faint crackles. Unlabored.  Heart: RRR with S1 S2. No murmurs,  rubs, or gallops appreciated. Abdomen: Soft, non-tender, non-distended with normoactive bowel sounds. No rebound/guarding. No obvious abdominal masses. M-S:  Appears weak Lower extremities: + 2 edema Neuro: Alert and oriented X 3. Moves all extremities spontaneously.  Psych:  Pleasantly confused Dialysis Access: R IJ      RUA AVF maturing +bruit (placed 3/16)  Dialysis Orders: TTS @ [facility] 4 hr   43kgs  4K/2.25Ca    180   400/800    R IJ No heparin    0.5 calcitriol   No ESA or Fe (tsat 59  5/14)  Assessment/Plan: 1.  DVT- verified on venous duplex.  heparin gtt and coumadin per pharmacy. 2.  ESRD -  TTS @ [facility]. K+ 4.3 HD pending today 3.  Hypertension/volume  - 137/43, at EDW, will need to lower at DC 4.  Anemia  - hgb 9.5 (from 12.2 on 5/14) start Aranesp 40 Q Tuesday. No Fe, watch CBC 5.  Metabolic bone disease -  Corrected Ca+9. Phos 3.5. Cont calcitriol and phoslo 6.  Nutrition - alb 1.9. Poor intake per nursing. Renal diet. Nepro, multivit 7. afib- amiodarone 8. dispo- really a poor HD candidate. Would benefit from palliative care consult. Son has been insisting on HD  Shelle Iron, NP [place] [phone] 04/29/2014, 4:14 PM   Pt seen, examined and agree w A/P as above.  Kelly Splinter MD pager [phone]    cell [phone] 04/29/2014, 4:44 PM

## 2014-04-29 NOTE — Progress Notes (Signed)
PROGRESS NOTE  [name]:[id number] DOB: November 13, 1926 DOA: 04/27/2014 PCP: Robyn Haber, MD  Interim summary  80 year old female with a history of dementia, hypertension, atrial fibrillation, ESRD, stroke presents with worsening bilateral lower extremity edema. The patient was diagnosed with a right lower extremity DVT on 03/24/14. According to the son, there was worsening right lower extremity edema on the day of admission. Venous duplex  of the lower extremity at [facility] showed acute to subacute DVT of the right common femoral vein with subacute DVT of the right femoral and popliteal vein as well as chronic DVT in the left lower extremity. The patient was started on apixiban approximately on 04/10/2014. She was admitted 3/10-3/24 due to worsening anemia (Hgb 6.2) in the setting of UGI bleed and progressive renal failure (Cr 7). She was transfused with PRBCs. She was guaiac positive and underwent EGD with GI that was unrevealing. Anemia was felt to be multifactorial from slow GI losses in setting of coagulopathy (INR was 5.9 on admit), renal disease, malabsorption from prior GI surgery. GI recommended that the patient remain off of coumadin for at least 2 weeks and asked that risks vs benefits be weighed prior to resuming. She had AVF placed during her stay and hemodialysis was started. She was seen by Palliative Care and Full Code Status was continued. She was d/c to [facility].  Son took pt home from [place].   Assessment/Plan:  DVT bilateral lower extremities  -Venous duplex shows acute to subacute DVT right common femoral vein, subacute DVT a right femoral and popliteal vein; chronic DVT left lower extremity  -According to medical record, patient was started on apixiban approximately 04/10/2014  -spoke with Dr. Bubba Camp (PCP) 5/18 who did not feel pt had started apixiban as of 5/13 office visit as pt did not have the bottle of medicine -5/18-Attempted to contact the patient's sonx3--no  answer; no ability to leave voicemail  -finally spoke to son on phone 5/19 -[facility] duplex report essentially unchanged from venous duplex performed on 03/24/2014  -As the patient has history of recent GI bleed and ESRD, hesitate to restart apixiban  -long discussion with the patient's son regarding risks benefits of long-term anticoagulation--he will make final decision on IVC filter vs starting coumadin on 04/30/14 -No present evidence of phlegmasia alba on physical examination--extremities are warm with popliteal pulses present bilateral  -Continue heparin drip until son's final decision on 04/30/14 ESRD  -Consult nephrology for maintenance dialysis  -Dialyzes Tuesday, Thursday, Saturday  Paroxysmal atrial fibrillation  -CHADS-VASc=6  -Anticoagulation previously discontinued due to GI bleed with hemoglobin down to 6  -Presently in sinus  -Continue amiodarone  Hypothyroidism  -Continue Synthroid  Hyperlipidemia  -Continue statin  Family Communication: spoke with son on phone Disposition Plan: Home when medically stable       Procedures/Studies: Dg Chest 2 View  04/27/2014   CLINICAL DATA:  Productive cough  EXAM: CHEST  2 VIEW  COMPARISON:  None.  FINDINGS: The heart size and mediastinal contours are stable. There is no focal infiltrate or pulmonary edema. There are small bilateral pleural effusions. The lungs are hyperinflated. Dual-lumen right central venous line is unchanged. The visualized skeletal structures are stable.  IMPRESSION: No focal pneumonia.  Small bilateral pleural effusions.  Emphysema.   Electronically Signed   By: Abelardo Diesel M.D.   On: 04/27/2014 16:19         Subjective: Patient is pleasantly confused. Denies fevers, chills,  chest pain, shortness breath, vomiting, diarrhea.  Objective: Filed Vitals:   04/29/14 0900 04/29/14 1800 04/29/14 1830 04/29/14 1856  BP: 137/43 140/55 125/50 129/52  Pulse: 78 72 78 87  Temp: 98.9 F (37.2 C) 98 F (36.7 C)      TempSrc: Oral Oral    Resp: 18 16 16 19   Height:      Weight:  45 kg (99 lb 3.3 oz)    SpO2: 94% 90%  97%    Intake/Output Summary (Last 24 hours) at 04/29/14 1912 Last data filed at 04/29/14 1300  Gross per 24 hour  Intake    120 ml  Output      0 ml  Net    120 ml   Weight change:  Exam:   General:  Pt is alert, does not follow commands appropriately, not in acute distress  HEENT: No icterus, No thrush,[place]/AT  Cardiovascular: RRR, S1/S2, no rubs, no gallops  Respiratory: CTA bilaterally, no wheezing, no crackles, no rhonchi  Abdomen: Soft/+BS, non tender, non distended, no guarding  Extremities: 2+ LE edema, No lymphangitis, No petechiae, No rashes, no synovitis  Data Reviewed: Basic Metabolic Panel:  Recent Labs Lab 04/23/14 1235 04/27/14 1703 04/28/14 0640 04/29/14 0542  NA 140 134* 133* 131*  K 4.7 4.3 4.3 4.3  CL 100 97 99 98  CO2 24 27 23 24   GLUCOSE 67 81 82 85  BUN 25 30* 37* 50*  CREATININE 2.28* 2.30* 2.68* 3.40*  CALCIUM 8.0* 7.7* 7.4* 7.3*   Liver Function Tests:  Recent Labs Lab 04/23/14 1235  AST 36  ALT 23  ALKPHOS 132*  BILITOT 0.4  PROT 4.8*   No results found for this basename: LIPASE, AMYLASE,  in the last 168 hours No results found for this basename: AMMONIA,  in the last 168 hours CBC:  Recent Labs Lab 04/23/14 1235 04/27/14 1703 04/28/14 0640 04/29/14 0542  WBC 5.7 8.1 7.9 8.9  NEUTROABS 4.0  --   --   --   HGB 12.2 11.3* 9.9* 9.5*  HCT 39.2 35.9* 30.9* 29.6*  MCV 97 96.5 93.9 94.3  PLT  --  144* 168 181   Cardiac Enzymes: No results found for this basename: CKTOTAL, CKMB, CKMBINDEX, TROPONINI,  in the last 168 hours BNP: No components found with this basename: POCBNP,  CBG: No results found for this basename: GLUCAP,  in the last 168 hours  Recent Results (from the past 240 hour(s))  MRSA PCR SCREENING     Status: None   Collection Time    04/27/14  9:57 PM      Result Value Ref Range Status   MRSA by PCR  NEGATIVE  NEGATIVE Final   Comment:            The GeneXpert MRSA Assay (FDA     approved for NASAL specimens     only), is one component of a     comprehensive MRSA colonization     surveillance program. It is not     intended to diagnose MRSA     infection nor to guide or     monitor treatment for     MRSA infections.     Scheduled Meds: . amiodarone  100 mg Oral Daily  . antiseptic oral rinse  15 mL Mouth Rinse q12n4p  . atorvastatin  40 mg Oral QHS  . calcitRIOL  0.5 mcg Oral Q T,Th,Sat-1800  . calcium acetate  667 mg Oral TID WC  .  carbamide peroxide  5 drop Both Ears BID  . chlorhexidine  15 mL Mouth Rinse BID  . [START ON 05/06/2014] darbepoetin (ARANESP) injection - DIALYSIS  40 mcg Intravenous Q Tue-HD  . feeding supplement (NEPRO CARB STEADY)  237 mL Oral Daily  . feeding supplement (PRO-STAT SUGAR FREE 64)  30 mL Oral BID  . levothyroxine  50 mcg Oral QAC breakfast  . mirtazapine  15 mg Oral QHS  . multivitamin  1 tablet Oral QHS  . pantoprazole  20 mg Oral Daily  . sodium chloride  3 mL Intravenous Q12H  . warfarin  2 mg Oral ONCE-1800  . Warfarin - Pharmacist Dosing Inpatient   Does not apply q1800   Continuous Infusions: . heparin 450 Units/hr (04/29/14 1035)     Orson Eva, DO  [facility] Pager [phone]  If 7PM-7AM, please contact night-coverage [email or web address] Password [email or web address] 04/29/2014, 7:12 PM   LOS: 2 days

## 2014-04-29 NOTE — Progress Notes (Signed)
ANTICOAGULATION CONSULT NOTE - Follow Up Consult  Pharmacy Consult:  Heparin / Coumadin Indication:  Acute and chronic DVT  No Known Allergies  Patient Measurements: Height: 5' 1.81" (157 cm) Weight: 95 lb 6.4 oz (43.273 kg) IBW/kg (Calculated) : 49.67 Heparin Dosing Weight: 43 kg  Vital Signs: Temp: 99.5 F (37.5 C) (05/19 0542) BP: 130/40 mmHg (05/19 0542) Pulse Rate: 84 (05/19 0542)  Labs:  Recent Labs  04/27/14 1703 04/28/14 0640 04/28/14 1725 04/29/14 0542  HGB 11.3* 9.9*  --  9.5*  HCT 35.9* 30.9*  --  29.6*  PLT 144* 168  --  181  APTT  --  >200* 126* 97*  LABPROT 18.5* 21.6*  --  17.5*  INR 1.59* 1.94*  --  1.48  HEPARINUNFRC  --  1.70* 1.22* 0.64  CREATININE 2.30* 2.68*  --  3.40*    Estimated Creatinine Clearance: 8.3 ml/min (by C-G formula based on Cr of 3.4).     Assessment: 34 YOF with history of chronic left DVT and new right acute to subacute DVT to continue on heparin infusion while INR is sub-therapeutic on Coumadin.  INR has decreased; heparin level is high normal; aPTT WNL; no bleeding reported.     Goal of Therapy:  Heparin level 0.3-0.7 units/ml APTT 66 - 102 sec Monitor platelets by anticoagulation protocol: Yes    Plan:  - Decrease heparin gtt slightly to 450 units/hr - Coumadin 2mg  PO today - Daily PT / INR / HL / CBC / aPTT    Aiman Sonn D. Mina Marble, PharmD, BCPS Pager:  [phone] 04/29/2014, 7:52 AM

## 2014-04-29 NOTE — Care Management Note (Signed)
Patient is active with [facility] - HHRN/PT/HHA.  Resumption of care orders requested upon discharge.

## 2014-04-30 ENCOUNTER — Encounter (HOSPITAL_COMMUNITY): Payer: Self-pay | Admitting: Radiology

## 2014-04-30 LAB — CBC
HEMATOCRIT: 30.4 % — AB (ref 36.0–46.0)
Hemoglobin: 9.7 g/dL — ABNORMAL LOW (ref 12.0–15.0)
MCH: 30.7 pg (ref 26.0–34.0)
MCHC: 31.9 g/dL (ref 30.0–36.0)
MCV: 96.2 fL (ref 78.0–100.0)
Platelets: 155 10*3/uL (ref 150–400)
RBC: 3.16 MIL/uL — ABNORMAL LOW (ref 3.87–5.11)
RDW: 15.7 % — AB (ref 11.5–15.5)
WBC: 6.3 10*3/uL (ref 4.0–10.5)

## 2014-04-30 LAB — PROTIME-INR
INR: 1.2 (ref 0.00–1.49)
Prothrombin Time: 14.9 seconds (ref 11.6–15.2)

## 2014-04-30 LAB — HEPARIN LEVEL (UNFRACTIONATED)
Heparin Unfractionated: 0.2 IU/mL — ABNORMAL LOW (ref 0.30–0.70)
Heparin Unfractionated: 0.18 IU/mL — ABNORMAL LOW (ref 0.30–0.70)

## 2014-04-30 LAB — APTT: aPTT: 66 seconds — ABNORMAL HIGH (ref 24–37)

## 2014-04-30 MED ORDER — HEPARIN (PORCINE) IN NACL 100-0.45 UNIT/ML-% IJ SOLN
650.0000 [IU]/h | INTRAMUSCULAR | Status: DC
  Administered 2014-04-30: 500 [IU]/h via INTRAVENOUS

## 2014-04-30 MED ORDER — WARFARIN SODIUM 3 MG PO TABS
3.0000 mg | ORAL_TABLET | Freq: Once | ORAL | Status: DC
  Filled 2014-04-30: qty 1

## 2014-04-30 NOTE — Progress Notes (Signed)
ANTICOAGULATION CONSULT NOTE - Follow Up Consult  Pharmacy Consult:  Heparin / Coumadin Indication:  Acute and chronic DVT  No Known Allergies  Patient Measurements: Height: 5' 1.81" (157 cm) Weight: 95 lb 0.3 oz (43.1 kg) IBW/kg (Calculated) : 49.67 Heparin Dosing Weight: 43 kg  Vital Signs: Temp: 98.3 F (36.8 C) (05/20 0520) BP: 113/42 mmHg (05/20 1025) Pulse Rate: 75 (05/20 1025)  Labs:  Recent Labs  04/27/14 1703  04/28/14 0640 04/28/14 1725 04/29/14 0542 04/30/14 0805  HGB 11.3*  --  9.9*  --  9.5* 9.7*  HCT 35.9*  --  30.9*  --  29.6* 30.4*  PLT 144*  --  168  --  181 155  APTT  --   < > >200* 126* 97* 66*  LABPROT 18.5*  --  21.6*  --  17.5* 14.9  INR 1.59*  --  1.94*  --  1.48 1.20  HEPARINUNFRC  --   < > 1.70* 1.22* 0.64 0.20*  CREATININE 2.30*  --  2.68*  --  3.40*  --   < > = values in this interval not displayed.  Estimated Creatinine Clearance: 8.2 ml/min (by C-G formula based on Cr of 3.4).   Assessment: 19 YOF with history of chronic left DVT and new right acute to subacute DVT to continue on heparin infusion while INR is sub-therapeutic on warfarin. Have been checking aPTT along with heparin levels as patient was on apixaban PTA. Now appears these are correlating and we can just use heparin levels moving forward. Heparin level low, INR dropped further (also likely from apixaban clearing).  Hgb and plts stable, no bleeding noted.  Goal of Therapy:  Heparin level 0.3-0.7 units/ml INR 2-3 Monitor platelets by anticoagulation protocol: Yes    Plan:  1. Increase heparin to 500 units/hr 2. Check heparin level in 8 hours 3. Warfarin 3mg  po x1 tonight 4. Daily HL/CBC/INR 5. Follow for possibility to IVC filter placement  Hriday Stai D. Christyn Gutkowski, PharmD, BCPS Clinical Pharmacist Pager: [phone] 04/30/2014 10:53 AM

## 2014-04-30 NOTE — H&P (Signed)
Chief Complaint: "Lower extremity swelling." Referring Physician: Dr. Maryland Pink HPI: [name] is an 80 y.o. female with PMHX of ESRD on HD, PAF, CVA, RLE DVT, HTN, GI bleed who presented complaining of increased lower extremity swelling. Venous duplex LE study revealed an acute to subacute DVT noted in the right common femoral vein and subacute DVT noted in the right femoral and popliteal veins. There is chronic DVT noted throughout the left lower extremity. She was previously on anticoagulation, however had a GI bleed in march of this year and required blood transfusion. In march, coumadin was discontinued and eliquis was to be started. Per chart review and the patient's PCP it is unclear if she was actually taking eliquis as instructed. She does have a significant fall history and given recent GI bleed IR received request for an image guided IVC filter. History is obtained per chart review and from her son over the phone today as the patient has dementia.     Past Medical History:  Past Medical History  Diagnosis Date  . Dementia   . Hypertension   . Ulcer     Hx of  . Stomach cancer   . Snoring   . Fracture, intertrochanteric, left femur 03/25/2012  . Fracture, intertrochanteric, right femur 06/2008    s/p closed reduction and internal fixation  . Nodule of left lung     followed by Dr. Elsworth Soho // LLL spiculated nodule noted per CT in 2012 1.5x1.6x2.4, PET in 06/03/2011 was low uptake.  . Paroxysmal atrial fibrillation   . Tachycardia-bradycardia     Past Surgical History:  Past Surgical History  Procedure Laterality Date  . Stomach surgery      3/4 of stomach removed due to stomach cancer  . Hip surgery  2009    right  . Total hip arthroplasty  03/29/2012    Procedure: TOTAL HIP ARTHROPLASTY;  Surgeon: Johnny Bridge, MD;  Location: [facility];  Service: Orthopedics;  Laterality: Left;  Left Hip Hemiarthroplasty  . Esophagogastroduodenoscopy N/A 02/20/2014    Procedure:  ESOPHAGOGASTRODUODENOSCOPY (EGD);  Surgeon: Arta Silence, MD;  Location: [facility] ENDOSCOPY;  Service: Endoscopy;  Laterality: N/A;  . Insertion of dialysis catheter Right 02/24/2014    Procedure: INSERTION OF DIALYSIS CATHETER;  Surgeon: Conrad Pella, MD;  Location: [facility];  Service: Vascular;  Laterality: Right;  . Av fistula placement Right 02/24/2014    Procedure: ARTERIOVENOUS (AV) FISTULA CREATION;  Surgeon: Conrad McKinney, MD;  Location: [facility] OR;  Service: Vascular;  Laterality: Right;    Family History:  Family History  Problem Relation Age of Onset  . Cancer Father   . Liver cancer Son   . Allergies      Social History:  reports that she quit smoking about 40 years ago. Her smoking use included Cigarettes. She has a .6 pack-year smoking history. She has never used smokeless tobacco. She reports that she drinks alcohol. She reports that she does not use illicit drugs.  Allergies: No Known Allergies  Medications:   Medication List    ASK your doctor about these medications       acetaminophen 500 MG tablet  Commonly known as:  TYLENOL  Take 500 mg by mouth 2 (two) times daily as needed (pain).     amiodarone 200 MG tablet  Commonly known as:  PACERONE  Take 100 mg by mouth daily.     apixaban 2.5 MG Tabs tablet  Commonly known as:  ELIQUIS  Take 2.5 mg  by mouth 2 (two) times daily.     atorvastatin 40 MG tablet  Commonly known as:  LIPITOR  Take 40 mg by mouth at bedtime.     calcium acetate 667 MG capsule  Commonly known as:  PHOSLO  Take 1 capsule (667 mg total) by mouth 3 (three) times daily with meals.     carbamide peroxide 6.5 % otic solution  Commonly known as:  DEBROX  Place 5 drops into both ears 2 (two) times daily.     feeding supplement (NEPRO CARB STEADY) Liqd  Take 237 mLs by mouth daily.     feeding supplement (PRO-STAT SUGAR FREE 64) Liqd  Take 30 mLs by mouth 2 (two) times daily.     levothyroxine 25 MCG tablet  Commonly known as:  SYNTHROID,  LEVOTHROID  Take 25 mcg by mouth daily before breakfast.     liver oil-zinc oxide 40 % ointment  Commonly known as:  DESITIN  Apply 1 application topically daily as needed (wound care/ upper buttocks and left heel).     mirtazapine 15 MG tablet  Commonly known as:  REMERON  Take 15 mg by mouth at bedtime.     multivitamin Tabs tablet  Take 1 tablet by mouth at bedtime.     OVER THE COUNTER MEDICATION  Take 40 g by mouth daily. PRO V 60: add 3 oz water to Nepro Carb Steady and mix in 1 scoop of Pro V 60 and drink daily     pantoprazole 20 MG tablet  Commonly known as:  PROTONIX  Take 1 tablet (20 mg total) by mouth daily.       Unable to obtain ROS secondary to patient's dementia.   Physical Exam: BP 113/42  Pulse 75  Temp(Src) 98.3 F (36.8 C) (Oral)  Resp 16  Ht 5' 1.81" (1.57 m)  Wt 95 lb 0.3 oz (43.1 kg)  BMI 17.49 kg/m2  SpO2 93% Body mass index is 17.49 kg/(m^2).  General Appearance:  Alert, cooperative, no distress, seems confused.  Head:  Normocephalic, without obvious abnormality, atraumatic  Neck: Supple, symmetrical, trachea midline  Lungs:   Clear to auscultation bilaterally, no w/r/r, respirations unlabored without use of accessory muscles.  Chest Wall:  Right IJ HD catheter intact  Heart:  Regular rate and rhythm, S1, S2 normal, no murmur, rub or gallop.  Abdomen:   Soft, non-tender, non distended, (+) BS  Extremities: 2+ Pitting edema bilaterally  Pulses: Unable to palpate secondary to edema.    Results for orders placed during the hospital encounter of 04/27/14 (from the past 48 hour(s))  HEPARIN LEVEL (UNFRACTIONATED)     Status: Abnormal   Collection Time    04/28/14  5:25 PM      Result Value Ref Range   Heparin Unfractionated 1.22 (*) 0.30 - 0.70 IU/mL   Comment: RESULTS CONFIRMED BY MANUAL DILUTION                IF HEPARIN RESULTS ARE BELOW     EXPECTED VALUES, AND PATIENT     DOSAGE HAS BEEN CONFIRMED,     SUGGEST FOLLOW UP TESTING      OF ANTITHROMBIN III LEVELS.  APTT     Status: Abnormal   Collection Time    04/28/14  5:25 PM      Result Value Ref Range   aPTT 126 (*) 24 - 37 seconds   Comment:            IF BASELINE aPTT  IS ELEVATED,     SUGGEST PATIENT RISK ASSESSMENT     BE USED TO DETERMINE APPROPRIATE     ANTICOAGULANT THERAPY.  CBC     Status: Abnormal   Collection Time    04/29/14  5:42 AM      Result Value Ref Range   WBC 8.9  4.0 - 10.5 K/uL   RBC 3.14 (*) 3.87 - 5.11 MIL/uL   Hemoglobin 9.5 (*) 12.0 - 15.0 g/dL   HCT 29.6 (*) 36.0 - 46.0 %   MCV 94.3  78.0 - 100.0 fL   MCH 30.3  26.0 - 34.0 pg   MCHC 32.1  30.0 - 36.0 g/dL   RDW 16.1 (*) 11.5 - 15.5 %   Platelets 181  150 - 400 K/uL  APTT     Status: Abnormal   Collection Time    04/29/14  5:42 AM      Result Value Ref Range   aPTT 97 (*) 24 - 37 seconds   Comment:            IF BASELINE aPTT IS ELEVATED,     SUGGEST PATIENT RISK ASSESSMENT     BE USED TO DETERMINE APPROPRIATE     ANTICOAGULANT THERAPY.  HEPARIN LEVEL (UNFRACTIONATED)     Status: None   Collection Time    04/29/14  5:42 AM      Result Value Ref Range   Heparin Unfractionated 0.64  0.30 - 0.70 IU/mL   Comment:            IF HEPARIN RESULTS ARE BELOW     EXPECTED VALUES, AND PATIENT     DOSAGE HAS BEEN CONFIRMED,     SUGGEST FOLLOW UP TESTING     OF ANTITHROMBIN III LEVELS.  PROTIME-INR     Status: Abnormal   Collection Time    04/29/14  5:42 AM      Result Value Ref Range   Prothrombin Time 17.5 (*) 11.6 - 15.2 seconds   INR 1.48  0.00 - 0.03  BASIC METABOLIC PANEL     Status: Abnormal   Collection Time    04/29/14  5:42 AM      Result Value Ref Range   Sodium 131 (*) 137 - 147 mEq/L   Potassium 4.3  3.7 - 5.3 mEq/L   Chloride 98  96 - 112 mEq/L   CO2 24  19 - 32 mEq/L   Glucose, Bld 85  70 - 99 mg/dL   BUN 50 (*) 6 - 23 mg/dL   Creatinine, Ser 3.40 (*) 0.50 - 1.10 mg/dL   Calcium 7.3 (*) 8.4 - 10.5 mg/dL   GFR calc non Af Amer 11 (*) >90 mL/min   GFR calc  Af Amer 13 (*) >90 mL/min   Comment: (NOTE)     The eGFR has been calculated using the CKD EPI equation.     This calculation has not been validated in all clinical situations.     eGFR's persistently <90 mL/min signify possible Chronic Kidney     Disease.  CBC     Status: Abnormal   Collection Time    04/30/14  8:05 AM      Result Value Ref Range   WBC 6.3  4.0 - 10.5 K/uL   RBC 3.16 (*) 3.87 - 5.11 MIL/uL   Hemoglobin 9.7 (*) 12.0 - 15.0 g/dL   HCT 30.4 (*) 36.0 - 46.0 %   MCV 96.2  78.0 - 100.0 fL  MCH 30.7  26.0 - 34.0 pg   MCHC 31.9  30.0 - 36.0 g/dL   RDW 15.7 (*) 11.5 - 15.5 %   Platelets 155  150 - 400 K/uL  APTT     Status: Abnormal   Collection Time    04/30/14  8:05 AM      Result Value Ref Range   aPTT 66 (*) 24 - 37 seconds   Comment:            IF BASELINE aPTT IS ELEVATED,     SUGGEST PATIENT RISK ASSESSMENT     BE USED TO DETERMINE APPROPRIATE     ANTICOAGULANT THERAPY.  HEPARIN LEVEL (UNFRACTIONATED)     Status: Abnormal   Collection Time    04/30/14  8:05 AM      Result Value Ref Range   Heparin Unfractionated 0.20 (*) 0.30 - 0.70 IU/mL   Comment:            IF HEPARIN RESULTS ARE BELOW     EXPECTED VALUES, AND PATIENT     DOSAGE HAS BEEN CONFIRMED,     SUGGEST FOLLOW UP TESTING     OF ANTITHROMBIN III LEVELS.  PROTIME-INR     Status: None   Collection Time    04/30/14  8:05 AM      Result Value Ref Range   Prothrombin Time 14.9  11.6 - 15.2 seconds   INR 1.20  0.00 - 1.49   No results found.  Assessment/Plan Dementia Paroxysmal atrial fibrillation, previously on anticoagulation discontinued secondary to GI bleed.  ESRD on HD TTS, right IJ intact and RUE AVF maturing History of RLE DVT was previously on anticoagulation. Worsening lower extremity edema, bilateral DVT on IV heparin now.  Fall risk  Recent GI bleed in march.  Request for image guided inferior vena cava filter placement with minimal sedation Patient will be NPO after midnight,  labs reviewed Risks and Benefits discussed with the patient's son over the phone. All questions were answered, patient's son is agreeable to proceed. Consent signed and in chart.   Hedy Jacob PA-C 04/30/2014, 2:43 PM

## 2014-04-30 NOTE — Evaluation (Signed)
Clinical/Bedside Swallow Evaluation Patient Details  Name: [name] MRN: [id number] Date of Birth: 1925-12-18  Today's Date: 04/30/2014 Time: 9323-5573 SLP Time Calculation (min): 27 min  Past Medical History:  Past Medical History  Diagnosis Date  . Dementia   . Hypertension   . Ulcer     Hx of  . Stomach cancer   . Snoring   . Fracture, intertrochanteric, left femur 03/25/2012  . Fracture, intertrochanteric, right femur 06/2008    s/p closed reduction and internal fixation  . Nodule of left lung     followed by Dr. Elsworth Soho // LLL spiculated nodule noted per CT in 2012 1.5x1.6x2.4, PET in 06/03/2011 was low uptake.  . Paroxysmal atrial fibrillation   . Tachycardia-bradycardia    Past Surgical History:  Past Surgical History  Procedure Laterality Date  . Stomach surgery      3/4 of stomach removed due to stomach cancer  . Hip surgery  2009    right  . Total hip arthroplasty  03/29/2012    Procedure: TOTAL HIP ARTHROPLASTY;  Surgeon: Johnny Bridge, MD;  Location: [place];  Service: Orthopedics;  Laterality: Left;  Left Hip Hemiarthroplasty  . Esophagogastroduodenoscopy N/A 02/20/2014    Procedure: ESOPHAGOGASTRODUODENOSCOPY (EGD);  Surgeon: Arta Silence, MD;  Location: [facility] ENDOSCOPY;  Service: Endoscopy;  Laterality: N/A;  . Insertion of dialysis catheter Right 02/24/2014    Procedure: INSERTION OF DIALYSIS CATHETER;  Surgeon: Conrad Rockingham, MD;  Location: [place];  Service: Vascular;  Laterality: Right;  . Av fistula placement Right 02/24/2014    Procedure: ARTERIOVENOUS (AV) FISTULA CREATION;  Surgeon: Conrad [place], MD;  Location: [place];  Service: Vascular;  Laterality: Right;   HPI:  [name] is a 80 y.o. female who presents with son to ED with BLE swelling for past 3 weeks.  Diagnosed with blood clot in R leg about 3 weeks ago, started on elequis. Despite elequis her swelling seemed worse to son when he woke her up 04/27/14. Patient has no CP no SOB. Pt has a h/o dysphagia  requiring altered solids/liquids, with most recent MBS 10/16/13 recommending regular textures and thin liquids with a chin tuck. Pt reports that she has no longer been utilizing a chin tuck, and there is no evidence of PNA on CXR.   Assessment / Plan / Recommendation Clinical Impression  Pt presents with no overt s/s pf aspiration at bedside, and denies recent utilization of chin tuck strategy recommended to reduce silent penetration on most recent MBS 10/2013. No difference is noted between thin liquids with and without chin tuck at the bedside. Recommend to continue current diet with SLP f/u to assess tolerance given h/o dysphagia.     Aspiration Risk  Moderate    Diet Recommendation Regular;Thin liquid   Liquid Administration via: Cup;No straw Medication Administration: Whole meds with puree Supervision: Patient able to self feed;Full supervision/cueing for compensatory strategies Compensations: Slow rate;Small sips/bites Postural Changes and/or Swallow Maneuvers: Seated upright 90 degrees    Other  Recommendations Oral Care Recommendations: Oral care BID   Follow Up Recommendations  Other (comment) (none anticipated at this time)    Frequency and Duration        Pertinent Vitals/Pain N/A    SLP Swallow Goals     Swallow Study Prior Functional Status       General Date of Onset:  (chronic) HPI: [name] is a 80 y.o. female who presents with son to ED with BLE swelling  for past 3 weeks.  Diagnosed with blood clot in R leg about 3 weeks ago, started on elequis. Despite elequis her swelling seemed worse to son when he woke her up 04/27/14. Patient has no CP no SOB. Pt has a h/o dysphagia requiring altered solids/liquids, with most recent MBS 10/16/13 recommending regular textures and thin liquids with a chin tuck. Pt reports that she has no longer been utilizing a chin tuck, and there is no evidence of PNA on CXR. Type of Study: Bedside swallow evaluation Previous Swallow  Assessment: see H&P Diet Prior to this Study: Regular;Thin liquids Temperature Spikes Noted: No Respiratory Status: Nasal cannula (2L) History of Recent Intubation: No Behavior/Cognition: Alert;Cooperative;Pleasant mood Oral Cavity - Dentition: Adequate natural dentition Self-Feeding Abilities: Able to feed self Patient Positioning: Upright in bed Baseline Vocal Quality: Low vocal intensity Volitional Cough: Strong    Oral/Motor/Sensory Function Overall Oral Motor/Sensory Function: Appears within functional limits for tasks assessed   Ice Chips Ice chips: Not tested   Thin Liquid Thin Liquid: Impaired Presentation: Cup;Self Fed Pharyngeal  Phase Impairments: Suspected delayed Swallow    Nectar Thick Nectar Thick Liquid: Not tested   Honey Thick Honey Thick Liquid: Not tested   Puree Puree: Not tested   Solid   GO    Solid: Within functional limits Presentation: Self Fed        Germain Osgood, M.A. CCC-SLP [phone]  Germain Osgood 04/30/2014,3:23 PM

## 2014-04-30 NOTE — Progress Notes (Signed)
Subjective:   Pleasantly confused. No complaints  Objective Filed Vitals:   04/29/14 2057 04/29/14 2059 04/29/14 2143 04/30/14 0520  BP: 71/54 102/53 140/51 128/36  Pulse: 97 96 85 79  Temp:  97.5 F (36.4 C) 97.9 F (36.6 C) 98.3 F (36.8 C)  TempSrc:  Oral    Resp: 19 16 17 16   Height:      Weight:  43.1 kg (95 lb 0.3 oz)    SpO2:   93% 93%   Physical Exam General: Alert, easily distracted. No acute distress.  Heart: RRR  Lungs: CTA, unlabored Abdomen: soft, nontender +BS Extremities: +3 LE edema moderate pitting UE and LE edema Dialysis Access: R IJ RUA AVF maturing +bruit (placed 3/16)  Dialysis Orders: TTS @ [facility]  4 hr 43kgs 4K/2.25Ca 180 400/800 R IJ  No heparin 0.5 calcitriol No ESA or Fe (tsat 59 5/14)   Assessment/Plan:  1. DVT- verified on venous duplex. heparin gtt and coumadin per pharmacy. Possible IVC filter- awaiting decision from son 2. ESRD - TTS @ [facility]. K+ 4.3 HD tomorrow. On 4K bath- watch 3. Hypertension/volume - 128/36, at EDW, will need to lower at DC. 1.8 L removed yesterday 4. Anemia - hgb 9.7 (from 12.2 on 5/14) start Aranesp 40 Q Tuesday. No Fe, watch CBC 5. Metabolic bone disease - Corrected Ca+9. Phos 3.5. Cont calcitriol and phoslo 6. Nutrition - alb 1.9. Poor intake per nursing- liberalize diet. Nepro, multivit 7. afib- amiodarone 8. dispo- really a poor HD candidate. Would benefit from palliative care consult. Son has been insisting on HD   Shelle Iron, NP [place] [phone] 04/30/2014,10:06 AM  LOS: 3 days   Pt seen, examined, agree w assess/plan as above with additions as indicated.  Kelly Splinter MD pager [phone]    cell [phone] 04/30/2014, 1:57 PM     Additional Objective Labs: Basic Metabolic Panel:  Recent Labs Lab 04/23/14 1235 04/27/14 1703 04/28/14 0640 04/29/14 0542  NA 140 134* 133* 131*  K 4.7 4.3 4.3 4.3  CL 100 97 99 98  CO2 24 27 23 24   GLUCOSE 67 81 82 85  BUN 25 30* 37* 50*   CREATININE 2.28* 2.30* 2.68* 3.40*  CALCIUM 8.0* 7.7* 7.4* 7.3*  ALB 2.1*  --   --   --    Liver Function Tests:  Recent Labs Lab 04/23/14 1235  AST 36  ALT 23  ALKPHOS 132*  BILITOT 0.4  PROT 4.8*   No results found for this basename: LIPASE, AMYLASE,  in the last 168 hours CBC:  Recent Labs Lab 04/23/14 1235  04/27/14 1703 04/28/14 0640 04/29/14 0542 04/30/14 0805  WBC 5.7  < > 8.1 7.9 8.9 6.3  NEUTROABS 4.0  --   --   --   --   --   HGB 12.2  --  11.3* 9.9* 9.5* 9.7*  HCT 39.2  --  35.9* 30.9* 29.6* 30.4*  MCV 97  --  96.5 93.9 94.3 96.2  PLT  --   < > 144* 168 181 155  < > = values in this interval not displayed. Blood Culture    Component Value Date/Time   SDES URINE, CATHETERIZED 02/18/2014 1259   SPECREQUEST NONE 02/18/2014 1259   CULT  Value: ESCHERICHIA COLI Performed at [facility] 02/18/2014 1259   REPTSTATUS 02/20/2014 FINAL 02/18/2014 1259    Cardiac Enzymes: No results found for this basename: CKTOTAL, CKMB, CKMBINDEX, TROPONINI,  in the last 168 hours CBG:  No results found for this basename: GLUCAP,  in the last 168 hours [place] Studies: No results found for this basename: IRON, TIBC, TRANSFERRIN, FERRITIN,  in the last 72 hours @lablastinr3 @ Studies/Results: No results found. Medications: . heparin 450 Units/hr (04/29/14 1035)   . amiodarone  100 mg Oral Daily  . antiseptic oral rinse  15 mL Mouth Rinse q12n4p  . atorvastatin  40 mg Oral QHS  . calcitRIOL  0.5 mcg Oral Q T,Th,Sat-1800  . calcium acetate  667 mg Oral TID WC  . carbamide peroxide  5 drop Both Ears BID  . chlorhexidine  15 mL Mouth Rinse BID  . [START ON 05/06/2014] darbepoetin (ARANESP) injection - DIALYSIS  40 mcg Intravenous Q Tue-HD  . feeding supplement (NEPRO CARB STEADY)  237 mL Oral Daily  . feeding supplement (PRO-STAT SUGAR FREE 64)  30 mL Oral BID  . levothyroxine  50 mcg Oral QAC breakfast  . mirtazapine  15 mg Oral QHS  . multivitamin  1 tablet Oral QHS  .  pantoprazole  20 mg Oral Daily  . sodium chloride  3 mL Intravenous Q12H  . Warfarin - Pharmacist Dosing Inpatient   Does not apply [phone]

## 2014-04-30 NOTE — Progress Notes (Signed)
ANTICOAGULATION CONSULT NOTE - Follow Up Consult  Pharmacy Consult:  Heparin / Coumadin Indication:  Acute and chronic DVT  No Known Allergies  Patient Measurements: Height: 5' 1.81" (157 cm) Weight: 95 lb 0.3 oz (43.1 kg) IBW/kg (Calculated) : 49.67 Heparin Dosing Weight: 43 kg  Vital Signs: BP: 113/42 mmHg (05/20 1025) Pulse Rate: 75 (05/20 1025)  Labs:  Recent Labs  04/28/14 0640 04/28/14 1725 04/29/14 0542 04/30/14 0805 04/30/14 1854  HGB 9.9*  --  9.5* 9.7*  --   HCT 30.9*  --  29.6* 30.4*  --   PLT 168  --  181 155  --   APTT >200* 126* 97* 66*  --   LABPROT 21.6*  --  17.5* 14.9  --   INR 1.94*  --  1.48 1.20  --   HEPARINUNFRC 1.70* 1.22* 0.64 0.20* 0.18*  CREATININE 2.68*  --  3.40*  --   --     Estimated Creatinine Clearance: 8.2 ml/min (by C-G formula based on Cr of 3.4).   Assessment: 89 YOF with history of chronic left DVT and new right acute to subacute DVT to continue on heparin infusion while INR is sub-therapeutic on warfarin. Heparin level 0.18 remains low.  Goal of Therapy:  Heparin level 0.3-0.7 units/ml (aim for lower end d/t hx GIB) INR 2-3 Monitor platelets by anticoagulation protocol: Yes   Plan:  Increase IV heparin to 650 units/hr Heparin level and CBC in AM  Nikita Surman S. Alford Highland, PharmD, [facility] Clinical Staff Pharmacist Pager [phone]  04/30/2014 7:37 PM

## 2014-04-30 NOTE — Progress Notes (Signed)
BP 128/36.  Asymptomatic.  Notified NP on call.  Will continue to monitor.

## 2014-04-30 NOTE — Progress Notes (Signed)
PROGRESS NOTE  [name] ZOX:[id number] DOB: [birth year] DOA: 04/27/2014 PCP: Robyn Haber, MD  Interim summary  80 year old female with a history of dementia, hypertension, atrial fibrillation, ESRD, stroke presents with worsening bilateral lower extremity edema. The patient was diagnosed with a right lower extremity DVT on 03/24/14. According to the son, there was worsening right lower extremity edema on the day of admission. Venous duplex  of the lower extremity at [facility] showed acute to subacute DVT of the right common femoral vein with subacute DVT of the right femoral and popliteal vein as well as chronic DVT in the left lower extremity. The patient was started on apixiban approximately on 04/10/2014. She was admitted 3/10-3/24 due to worsening anemia (Hgb 6.2) in the setting of UGI bleed and progressive renal failure (Cr 7). She was transfused with PRBCs. She was guaiac positive and underwent EGD with GI that was unrevealing. Anemia was felt to be multifactorial from slow GI losses in setting of coagulopathy (INR was 5.9 on admit), renal disease, malabsorption from prior GI surgery. GI recommended that the patient remain off of coumadin for at least 2 weeks and asked that risks vs benefits be weighed prior to resuming. She had AVF placed during her stay and hemodialysis was started. She was seen by Palliative Care and Full Code Status was continued. She was d/c to [facility].  Son took pt home from [place].   Assessment/Plan:  DVT bilateral lower extremities  -Venous duplex shows acute to subacute DVT right common femoral vein, subacute DVT a right femoral and popliteal vein; chronic DVT left lower extremity  -According to medical record, patient was started on apixiban approximately 04/10/2014  -spoke with Dr. Bubba Camp (PCP) 5/18 who did not feel pt had started apixiban as of 5/13 office visit as pt did not have the bottle of medicine -5/18-Attempted to contact the patient's sonx3--no  answer; no ability to leave voicemail  -finally spoke to son on phone 5/19 -[place] duplex report essentially unchanged from venous duplex performed on 03/24/2014  -As the patient has history of recent GI bleed and ESRD, hesitate to restart apixiban  -long discussion with the patient's son regarding risks benefits of long-term anticoagulation--son has decided on IVC filter. Have stopped Coumadin. Consult interventional radiology to see, possibly tomorrow to do -No present evidence of phlegmasia alba on physical examination--extremities are warm with popliteal pulses present bilateral  -Continue heparin drip.  ESRD  -Consult nephrology for maintenance dialysis  -Dialyzes Tuesday, Thursday, Saturday  Paroxysmal atrial fibrillation  -CHADS-VASc=6  -Anticoagulation previously discontinued due to GI bleed with hemoglobin down to 6  -Presently in sinus  -Continue amiodarone  Hypothyroidism  -Continue Synthroid  Hyperlipidemia  -Continue statin  Family Communication: spoke with son on phone Disposition Plan: Home after filter placed   Procedures/Studies: Dg Chest 2 View  04/27/2014   CLINICAL DATA:  Productive cough  EXAM: CHEST  2 VIEW  COMPARISON:  None.  FINDINGS: The heart size and mediastinal contours are stable. There is no focal infiltrate or pulmonary edema. There are small bilateral pleural effusions. The lungs are hyperinflated. Dual-lumen right central venous line is unchanged. The visualized skeletal structures are stable.  IMPRESSION: No focal pneumonia.  Small bilateral pleural effusions.  Emphysema.   Electronically Signed   By: Abelardo Diesel M.D.   On: 04/27/2014 16:19         Subjective: Patient is currently somnolent  Objective: Filed Vitals:   04/29/14  2059 04/29/14 2143 04/30/14 0520 04/30/14 1025  BP: 102/53 140/51 128/36 113/42  Pulse: 96 85 79 75  Temp: 97.5 F (36.4 C) 97.9 F (36.6 C) 98.3 F (36.8 C)   TempSrc: Oral     Resp: 16 17 16    Height:        Weight: 43.1 kg (95 lb 0.3 oz)     SpO2:  93% 93%     Intake/Output Summary (Last 24 hours) at 04/30/14 1406 Last data filed at 04/30/14 0900  Gross per 24 hour  Intake     60 ml  Output   1829 ml  Net  -1769 ml   Weight change:  Exam:   General:  Pt currently somnolent, no acute distress  Cardiovascular: RRR, S1/S2  Respiratory: CTA bilaterally  Abdomen: Soft, nontender, nondistended, hypoactive bowel sounds  Extremities: No clubbing or cyanosis, 2+ pitting edema from knees down bilaterally  Data Reviewed: Basic Metabolic Panel:  Recent Labs Lab 04/27/14 1703 04/28/14 0640 04/29/14 0542  NA 134* 133* 131*  K 4.3 4.3 4.3  CL 97 99 98  CO2 27 23 24   GLUCOSE 81 82 85  BUN 30* 37* 50*  CREATININE 2.30* 2.68* 3.40*  CALCIUM 7.7* 7.4* 7.3*   Liver Function Tests: No results found for this basename: AST, ALT, ALKPHOS, BILITOT, PROT, ALBUMIN,  in the last 168 hours No results found for this basename: LIPASE, AMYLASE,  in the last 168 hours No results found for this basename: AMMONIA,  in the last 168 hours CBC:  Recent Labs Lab 04/27/14 1703 04/28/14 0640 04/29/14 0542 04/30/14 0805  WBC 8.1 7.9 8.9 6.3  HGB 11.3* 9.9* 9.5* 9.7*  HCT 35.9* 30.9* 29.6* 30.4*  MCV 96.5 93.9 94.3 96.2  PLT 144* 168 181 155   Cardiac Enzymes: No results found for this basename: CKTOTAL, CKMB, CKMBINDEX, TROPONINI,  in the last 168 hours BNP: No components found with this basename: POCBNP,  CBG: No results found for this basename: GLUCAP,  in the last 168 hours  Recent Results (from the past 240 hour(s))  MRSA PCR SCREENING     Status: None   Collection Time    04/27/14  9:57 PM      Result Value Ref Range Status   MRSA by PCR NEGATIVE  NEGATIVE Final   Comment:            The GeneXpert MRSA Assay (FDA     approved for NASAL specimens     only), is one component of a     comprehensive MRSA colonization     surveillance program. It is not     intended to diagnose  MRSA     infection nor to guide or     monitor treatment for     MRSA infections.     Scheduled Meds: . amiodarone  100 mg Oral Daily  . antiseptic oral rinse  15 mL Mouth Rinse q12n4p  . atorvastatin  40 mg Oral QHS  . calcitRIOL  0.5 mcg Oral Q T,Th,Sat-1800  . calcium acetate  667 mg Oral TID WC  . carbamide peroxide  5 drop Both Ears BID  . chlorhexidine  15 mL Mouth Rinse BID  . [START ON 05/06/2014] darbepoetin (ARANESP) injection - DIALYSIS  40 mcg Intravenous Q Tue-HD  . feeding supplement (NEPRO CARB STEADY)  237 mL Oral Daily  . feeding supplement (PRO-STAT SUGAR FREE 64)  30 mL Oral BID  . levothyroxine  50 mcg Oral QAC  breakfast  . mirtazapine  15 mg Oral QHS  . multivitamin  1 tablet Oral QHS  . pantoprazole  20 mg Oral Daily  . sodium chloride  3 mL Intravenous Q12H   Continuous Infusions: . heparin 500 Units/hr (04/30/14 1137)     Annita Brod, M.D.  [facility] Pager [phone]  If 7PM-7AM, please contact night-coverage [email or web address] Password [email or web address] 04/30/2014, 2:06 PM   LOS: 3 days

## 2014-05-01 ENCOUNTER — Inpatient Hospital Stay (HOSPITAL_COMMUNITY): Payer: Medicare Other

## 2014-05-01 ENCOUNTER — Ambulatory Visit: Payer: Self-pay | Admitting: Internal Medicine

## 2014-05-01 DIAGNOSIS — F0390 Unspecified dementia without behavioral disturbance: Secondary | ICD-10-CM

## 2014-05-01 LAB — GLUCOSE, CAPILLARY
GLUCOSE-CAPILLARY: 54 mg/dL — AB (ref 70–99)
GLUCOSE-CAPILLARY: 55 mg/dL — AB (ref 70–99)
GLUCOSE-CAPILLARY: 81 mg/dL (ref 70–99)
Glucose-Capillary: 69 mg/dL — ABNORMAL LOW (ref 70–99)
Glucose-Capillary: 93 mg/dL (ref 70–99)

## 2014-05-01 LAB — HEPARIN LEVEL (UNFRACTIONATED): HEPARIN UNFRACTIONATED: 1.14 [IU]/mL — AB (ref 0.30–0.70)

## 2014-05-01 LAB — RENAL FUNCTION PANEL
Albumin: 1.3 g/dL — ABNORMAL LOW (ref 3.5–5.2)
BUN: 39 mg/dL — AB (ref 6–23)
CHLORIDE: 99 meq/L (ref 96–112)
CO2: 25 mEq/L (ref 19–32)
Calcium: 7.8 mg/dL — ABNORMAL LOW (ref 8.4–10.5)
Creatinine, Ser: 3.11 mg/dL — ABNORMAL HIGH (ref 0.50–1.10)
GFR calc Af Amer: 15 mL/min — ABNORMAL LOW (ref >90)
GFR calc non Af Amer: 13 mL/min — ABNORMAL LOW (ref >90)
GLUCOSE: 43 mg/dL — AB (ref 70–99)
Phosphorus: 2.8 mg/dL (ref 2.3–4.6)
Potassium: 3.4 mEq/L — ABNORMAL LOW (ref 3.7–5.3)
Sodium: 136 mEq/L — ABNORMAL LOW (ref 137–147)

## 2014-05-01 LAB — CBC
HCT: 32.1 % — ABNORMAL LOW (ref 36.0–46.0)
HEMOGLOBIN: 10.7 g/dL — AB (ref 12.0–15.0)
MCH: 31.2 pg (ref 26.0–34.0)
MCHC: 33.3 g/dL (ref 30.0–36.0)
MCV: 93.6 fL (ref 78.0–100.0)
Platelets: 200 10*3/uL (ref 150–400)
RBC: 3.43 MIL/uL — ABNORMAL LOW (ref 3.87–5.11)
RDW: 15.9 % — ABNORMAL HIGH (ref 11.5–15.5)
WBC: 5.8 10*3/uL (ref 4.0–10.5)

## 2014-05-01 LAB — HEPATITIS B SURFACE ANTIGEN: Hepatitis B Surface Ag: NEGATIVE

## 2014-05-01 MED ORDER — LIDOCAINE-PRILOCAINE 2.5-2.5 % EX CREA: 1.0000 "application " | TOPICAL_CREAM | CUTANEOUS | Status: DC | PRN

## 2014-05-01 MED ORDER — MIDAZOLAM HCL 2 MG/2ML IJ SOLN
INTRAMUSCULAR | Status: AC
  Filled 2014-05-01: qty 4

## 2014-05-01 MED ORDER — SODIUM CHLORIDE 0.9 % IV SOLN: 100.0000 mL | INTRAVENOUS | Status: DC | PRN

## 2014-05-01 MED ORDER — ALTEPLASE 2 MG IJ SOLR
2.0000 mg | Freq: Once | INTRAMUSCULAR | Status: AC | PRN
  Filled 2014-05-01: qty 2

## 2014-05-01 MED ORDER — DEXTROSE 50 % IV SOLN
INTRAVENOUS | Status: AC
  Filled 2014-05-01: qty 50

## 2014-05-01 MED ORDER — NEPRO/CARBSTEADY PO LIQD
237.0000 mL | ORAL | Status: DC | PRN
Start: 2014-05-01 — End: 2014-05-03

## 2014-05-01 MED ORDER — FENTANYL CITRATE 0.05 MG/ML IJ SOLN
INTRAMUSCULAR | Status: AC | PRN
  Administered 2014-05-01: 25 ug via INTRAVENOUS

## 2014-05-01 MED ORDER — FENTANYL CITRATE 0.05 MG/ML IJ SOLN
INTRAMUSCULAR | Status: AC
  Filled 2014-05-01: qty 4

## 2014-05-01 MED ORDER — DEXTROSE 50 % IV SOLN
25.0000 mL | Freq: Once | INTRAVENOUS | Status: AC
  Administered 2014-05-05: 25 mL via INTRAVENOUS
  Filled 2014-05-01: qty 50

## 2014-05-01 MED ORDER — PENTAFLUOROPROP-TETRAFLUOROETH EX AERO: 1.0000 "application " | INHALATION_SPRAY | CUTANEOUS | Status: DC | PRN

## 2014-05-01 MED ORDER — IOHEXOL 300 MG/ML  SOLN
100.0000 mL | Freq: Once | INTRAMUSCULAR | Status: AC | PRN
  Administered 2014-05-01: 50 mL via INTRAVENOUS

## 2014-05-01 MED ORDER — IOHEXOL 300 MG/ML  SOLN: 100.0000 mL | Freq: Once | INTRAMUSCULAR | Status: AC | PRN

## 2014-05-01 MED ORDER — MIDAZOLAM HCL 2 MG/2ML IJ SOLN
INTRAMUSCULAR | Status: AC | PRN
  Administered 2014-05-01: 0.5 mg via INTRAVENOUS

## 2014-05-01 MED ORDER — HEPARIN SODIUM (PORCINE) 1000 UNIT/ML DIALYSIS
1000.0000 [IU] | INTRAMUSCULAR | Status: DC | PRN
  Filled 2014-05-01: qty 1

## 2014-05-01 MED ORDER — LIDOCAINE HCL (PF) 1 % IJ SOLN
5.0000 mL | INTRAMUSCULAR | Status: DC | PRN
Start: 2014-05-01 — End: 2014-05-03

## 2014-05-01 NOTE — Progress Notes (Signed)
ANTICOAGULATION CONSULT NOTE - Follow Up Consult  Pharmacy Consult:  Heparin Indication:  Acute and chronic DVT  No Known Allergies  Patient Measurements: Height: 5' 1.81" (157 cm) Weight: 95 lb 3.8 oz (43.2 kg) IBW/kg (Calculated) : 49.67 Heparin Dosing Weight: 43 kg  Vital Signs: Temp: 96.8 F (36 C) (05/21 1254) Temp src: Axillary (05/21 1254) BP: 119/54 mmHg (05/21 1330) Pulse Rate: 72 (05/21 1330)  Labs:  Recent Labs  04/28/14 1725  04/29/14 0542 04/30/14 0805 04/30/14 1854 05/01/14 0500 05/01/14 1257  HGB  --   < > 9.5* 9.7*  --  10.7*  --   HCT  --   --  29.6* 30.4*  --  32.1*  --   PLT  --   --  181 155  --  200  --   APTT 126*  --  97* 66*  --   --   --   LABPROT  --   --  17.5* 14.9  --   --   --   INR  --   --  1.48 1.20  --   --   --   HEPARINUNFRC 1.22*  --  0.64 0.20* 0.18* 1.14*  --   CREATININE  --   --  3.40*  --   --   --  3.11*  < > = values in this interval not displayed.  Estimated Creatinine Clearance: 9 ml/min (by C-G formula based on Cr of 3.11).   Assessment: 35 YOF with history of chronic left DVT and new right acute to subacute DVT to continue on heparin infusion. She is s/p IVC filter placement this morning. Her daily heparin level was not drawn this morning as she was to go to HD and dialysis will obtain those labs. However, she went for her IVC filter placement this morning- heparin was turned off and then restarted at 650 units/hr 1232 by Kendrick Fries, 6E RN.  A heparin level was drawn at 1257 (per phlebotomy records, the computer states it was drawn at 0500 which is NOT correct) by the HD RN. It resulted at 1.14, but this is not accurate as it was drawn from her HD port which is heparinized.  Hgb and plts stable. No bleeding noted.  Goal of Therapy:  Heparin level 0.3-0.7 units/ml Monitor platelets by anticoagulation protocol: Yes   Plan:  1. Continue heparin at 650 units/hr 2. Check heparin level at 2030 tonight  3. Daily heparin  level and CBC while on therapy  Manly Nestle D. Brennan Karam, PharmD, BCPS Clinical Pharmacist Pager: [phone] 05/01/2014 2:06 PM

## 2014-05-01 NOTE — Progress Notes (Signed)
Subjective:  No cos, "at [facility] hosp" otherwise pleasantly Confused  Objective Vital signs in last 24 hours: Filed Vitals:   04/30/14 0520 04/30/14 1025 04/30/14 2019 05/01/14 0433  BP: 128/36 113/42 130/71 134/66  Pulse: 79 75 69 78  Temp: 98.3 F (36.8 C)  97.8 F (36.6 C)   TempSrc:      Resp: 16  15 16   Height:      Weight:   42.1 kg (92 lb 13 oz)   SpO2: 93%  96% 93%   Weight change: -2.9 kg (-6 lb 6.3 oz)  Physical Exam: General:awoken from sleep , alert and pleasantly confused, NAD, cachectic Heart: RRR , soft 1/6 sem lsb, no rub Lungs:poor resp. Effort and grossly CTA bilat. Abdomen: BS pos.  soft, nontender , nondistended  Extremities: +2 LE bilat. pitting  Edema, Rue swelling at avf. Non tender/ foul smeeling bilat. Feet wounds with clean bandages  Small 1.5 x 1.5 cm sacral decub with some spreading erythema, no drainage Dialysis Access: R IJ perm cath  and RUA AVF +bruit (placed 3/16)  Dialysis Orders: TTS @ [facility]  4 hr 43kgs 4K/2.25Ca 180 400/800 R IJ  No heparin 0.5 calcitriol No ESA or Fe (tsat 59 5/14)   Assessment/Plan:  1. DVT- R Lower ext. Subacute and L Lower ext chronic/ on  heparin gtt per pharmacy. This am IVC filter-per  Son' s decision yesterday 2. ESRD - TTS @ [place].HD today. On 4K bath- ck labs pre hd 3. Hypertension/volume - 134/66, wt 42.1 sl below  at EDW 43  BUT BED WTS. IN HOSP.,Has bipedal edema but with DVTs' will need to lower at DC. 4. Anemia - hgb 9.7 (from 12.2 on 5/14) start Aranesp 40 Q Tuesday. No Fe,moniter CBC 5. Metabolic bone disease - Corrected Ca+9. Phos 3.5. Cont calcitriol and phoslo 6. Nutrition - alb 1.9.liberalizing  Diet with poor po intake/ Nepro, multivit 7. afib- amiodarone/ no coumadin sec.  In past Gi bleed on apixiban 8. HO GI Bleed- stable hgb currently  9. dispo- Seen in past by  palliative care consult and  Son wanted to continue on HD  Ernest Haber, PA-C [place] [phone] 05/01/2014,8:03 AM  LOS: 4 days   Pt seen, examined and agree w A/P as above.  Kelly Splinter MD pager [phone]    cell [phone] 05/01/2014, 11:52 AM  Labs: Basic Metabolic Panel:  Recent Labs Lab 04/27/14 1703 04/28/14 0640 04/29/14 0542  NA 134* 133* 131*  K 4.3 4.3 4.3  CL 97 99 98  CO2 27 23 24   GLUCOSE 81 82 85  BUN 30* 37* 50*  CREATININE 2.30* 2.68* 3.40*  CALCIUM 7.7* 7.4* 7.3*   CBC:  Recent Labs Lab 04/27/14 1703 04/28/14 0640 04/29/14 0542 04/30/14 0805  WBC 8.1 7.9 8.9 6.3  HGB 11.3* 9.9* 9.5* 9.7*  HCT 35.9* 30.9* 29.6* 30.4*  MCV 96.5 93.9 94.3 96.2  PLT 144* 168 181 155   Studies/Results: No results found. Medications: . heparin 650 Units/hr (04/30/14 1942)   . amiodarone  100 mg Oral Daily  . antiseptic oral rinse  15 mL Mouth Rinse q12n4p  . atorvastatin  40 mg Oral QHS  . calcitRIOL  0.5 mcg Oral Q T,Th,Sat-1800  . calcium acetate  667 mg Oral TID WC  . carbamide peroxide  5 drop Both Ears BID  . chlorhexidine  15 mL Mouth Rinse BID  . [START ON 05/06/2014] darbepoetin (ARANESP) injection - DIALYSIS  40 mcg Intravenous  Q Tue-HD  . feeding supplement (NEPRO CARB STEADY)  237 mL Oral Daily  . feeding supplement (PRO-STAT SUGAR FREE 64)  30 mL Oral BID  . levothyroxine  50 mcg Oral QAC breakfast  . mirtazapine  15 mg Oral QHS  . multivitamin  1 tablet Oral QHS  . pantoprazole  20 mg Oral Daily  . sodium chloride  3 mL Intravenous Q12H

## 2014-05-01 NOTE — Progress Notes (Signed)
PROGRESS NOTE  [name] DGU:[id number] DOB: 1926/05/15 DOA: 04/27/2014 PCP: Robyn Haber, MD  Interim summary  80 year old female with a history of dementia, atrial fibrillation, ESRD, stroke presented 5/17 with worsening bilateral lower extremity edema.Pt admitted 3/10-3/24 due to worsening anemia (Hgb 6.2) in the setting of UGI bleed and progressive renal failure (Cr 7). She was transfused with PRBCs. She was guaiac positive and underwent EGD with GI that was unrevealing. Anemia was felt to be multifactorial from slow GI losses in setting of coagulopathy (INR was 5.9 on admit), renal disease, malabsorption from prior GI surgery. GI recommended that the patient remain off of coumadin for at least 2 weeks and asked that risks vs benefits be weighed prior to resuming. She had AVF placed during her stay and hemodialysis was started. She was seen by Palliative Care and Full Code Status was continued. She was d/c to [facility].  Son took pt home from [place].     The patient was diagnosed with a right lower extremity DVT on 03/24/14. According to the son, there was worsening right lower extremity edema on the day of admission. Venous duplex  of the lower extremity at [facility] showed acute to subacute DVT of the right common femoral vein with subacute DVT of the right femoral and popliteal vein as well as chronic DVT in the left lower extremity. The patient was started on apixiban approximately on 04/10/2014.   After extensive review, it is felt that actually patient's DVT was not acute and this was more subacute from previous. After some discussion with patient's son, he decided on 5/20 to avoid anticoagulation and went with IVC filter.  Interventional radiology placed filter on 5/21.  Assessment/Plan:  DVT bilateral lower extremities  -Venous duplex shows acute to subacute DVT right common femoral vein, subacute DVT a right femoral and popliteal vein; chronic DVT left lower extremity    -According to medical record, patient was started on apixiban approximately 04/10/2014  -spoke with Dr. Bubba Camp (PCP) 5/18 who did not feel pt had started apixiban as of 5/13 office visit as pt did not have the bottle of medicine -5/18-Attempted to contact the patient's sonx3--no answer; no ability to leave voicemail  -finally spoke to son on phone 5/19 -[facility] duplex report essentially unchanged from venous duplex performed on 03/24/2014  -As the patient has history of recent GI bleed and ESRD, hesitate to restart apixiban  -long discussion with the patient's son regarding risks benefits of long-term anticoagulation--son has decided on IVC filter, which was placed by interventional radiology on 5/21 -No present evidence of phlegmasia alba on physical examination--extremities are warm with popliteal pulses present bilateral  -Heparin drip discontinued after filter placed  ESRD  -Consult nephrology for maintenance dialysis  -Dialyzes Tuesday, Thursday, Saturday  Paroxysmal atrial fibrillation  -CHADS-VASc=6  -Anticoagulation previously discontinued due to GI bleed with hemoglobin down to 6  -Presently in sinus  -Continue amiodarone  Hypothyroidism  -Continue Synthroid  Hyperlipidemia  -Continue statin  Family Communication: Updated son by phone Disposition Plan: Home in the next few days   Procedures/Studies: Dg Chest 2 View  04/27/2014   CLINICAL DATA:  Productive cough  EXAM: CHEST  2 VIEW  COMPARISON:  None.  FINDINGS: The heart size and mediastinal contours are stable. There is no focal infiltrate or pulmonary edema. There are small bilateral pleural effusions. The lungs are hyperinflated. Dual-lumen right central venous line is unchanged. The visualized skeletal structures are  stable.  IMPRESSION: No focal pneumonia.  Small bilateral pleural effusions.  Emphysema.   Electronically Signed   By: Abelardo Diesel M.D.   On: 04/27/2014 16:19         Subjective: Patient doing okay, no  complaints, no shortness of breath  Objective: Filed Vitals:   05/01/14 1600 05/01/14 1630 05/01/14 1700 05/01/14 1707  BP: 96/51 108/51 121/52 126/52  Pulse: 81 77 72 75  Temp:    98.2 F (36.8 C)  TempSrc:    Axillary  Resp:    16  Height:      Weight:    40.9 kg (90 lb 2.7 oz)  SpO2:    98%    Intake/Output Summary (Last 24 hours) at 05/01/14 1812 Last data filed at 05/01/14 1707  Gross per 24 hour  Intake      0 ml  Output   1214 ml  Net  -1214 ml   Weight change: -2.9 kg (-6 lb 6.3 oz) Exam:   General:  Alert and oriented x1, no acute distress  Cardiovascular: RRR, S1/S2  Respiratory: CTA bilaterally  Abdomen: Soft, nontender, nondistended, hypoactive bowel sounds  Extremities: No clubbing or cyanosis, 2+ pitting edema from knees down bilaterally  Data Reviewed: Basic Metabolic Panel:  Recent Labs Lab 04/27/14 1703 04/28/14 0640 04/29/14 0542 05/01/14 1257  NA 134* 133* 131* 136*  K 4.3 4.3 4.3 3.4*  CL 97 99 98 99  CO2 27 23 24 25   GLUCOSE 81 82 85 43*  BUN 30* 37* 50* 39*  CREATININE 2.30* 2.68* 3.40* 3.11*  CALCIUM 7.7* 7.4* 7.3* 7.8*  PHOS  --   --   --  2.8   Liver Function Tests:  Recent Labs Lab 05/01/14 1257  ALBUMIN 1.3*   No results found for this basename: LIPASE, AMYLASE,  in the last 168 hours No results found for this basename: AMMONIA,  in the last 168 hours CBC:  Recent Labs Lab 04/27/14 1703 04/28/14 0640 04/29/14 0542 04/30/14 0805 05/01/14 0500  WBC 8.1 7.9 8.9 6.3 5.8  HGB 11.3* 9.9* 9.5* 9.7* 10.7*  HCT 35.9* 30.9* 29.6* 30.4* 32.1*  MCV 96.5 93.9 94.3 96.2 93.6  PLT 144* 168 181 155 200   Cardiac Enzymes: No results found for this basename: CKTOTAL, CKMB, CKMBINDEX, TROPONINI,  in the last 168 hours BNP: No components found with this basename: POCBNP,  CBG:  Recent Labs Lab 05/01/14 1353 05/01/14 1430  GLUCAP 69* 93    Recent Results (from the past 240 hour(s))  MRSA PCR SCREENING     Status: None    Collection Time    04/27/14  9:57 PM      Result Value Ref Range Status   MRSA by PCR NEGATIVE  NEGATIVE Final   Comment:            The GeneXpert MRSA Assay (FDA     approved for NASAL specimens     only), is one component of a     comprehensive MRSA colonization     surveillance program. It is not     intended to diagnose MRSA     infection nor to guide or     monitor treatment for     MRSA infections.     Scheduled Meds: . amiodarone  100 mg Oral Daily  . antiseptic oral rinse  15 mL Mouth Rinse q12n4p  . atorvastatin  40 mg Oral QHS  . calcitRIOL  0.5 mcg Oral Q T,Th,Sat-1800  .  calcium acetate  667 mg Oral TID WC  . carbamide peroxide  5 drop Both Ears BID  . chlorhexidine  15 mL Mouth Rinse BID  . [START ON 05/06/2014] darbepoetin (ARANESP) injection - DIALYSIS  40 mcg Intravenous Q Tue-HD  . dextrose  25 mL Intravenous Once  . dextrose      . feeding supplement (NEPRO CARB STEADY)  237 mL Oral Daily  . feeding supplement (PRO-STAT SUGAR FREE 64)  30 mL Oral BID  . fentaNYL      . levothyroxine  50 mcg Oral QAC breakfast  . midazolam      . mirtazapine  15 mg Oral QHS  . multivitamin  1 tablet Oral QHS  . pantoprazole  20 mg Oral Daily  . sodium chloride  3 mL Intravenous Q12H   Continuous Infusions: . heparin 650 Units/hr (05/01/14 1232)     [name], M.D.  [facility] Pager [phone]  If 7PM-7AM, please contact night-coverage [email or web address] Password [email or web address] 05/01/2014, 6:12 PM   LOS: 4 days

## 2014-05-01 NOTE — Progress Notes (Signed)
Patient with 2 hypoglycemic episodes. Patient asymptomatic. Patient given a snack and cranberry juice. Dr. Maryland Pink notified. Will recheck CBG.   Analiah Drum Donnie Aho, RN.

## 2014-05-01 NOTE — Procedures (Signed)
IVC filter

## 2014-05-01 NOTE — Care Management Note (Signed)
CARE MANAGEMENT NOTE 05/01/2014  Patient:  [name], [name]   Account Number:  [id number]  Date Initiated:  04/28/2014  Documentation initiated by:  Lizabeth Leyden  Subjective/Objective Assessment:   admitted with lower extremity DVT  medical hx: DVT last month, ESRD/HD  05/01/14 Request my Dr Jonnie Finner for family meeting.     Action/Plan:   progression of care and discharge planning  05/01/2014 Spoke with son re family meeting ,scheduled for Friday, May 22 @ 2:30pm per son's wishes.   Anticipated DC Date:  05/02/2014   Anticipated DC Plan:  [place]  CM consult      Choice offered to / List presented to:             Status of service:  In process, will continue to follow Medicare Important Message given?   (If response is "NO", the following Medicare IM given date fields will be blank) Date Medicare IM given:   Date Additional Medicare IM given:    Discharge Disposition:    Per UR Regulation:    If discussed at Long Length of Stay Meetings, dates discussed:    Comments:

## 2014-05-01 NOTE — Progress Notes (Signed)
SLP Cancellation Note  Patient Details Name: [name] MRN: [id number] DOB: 03/23/1926   Cancelled treatment:       Reason Eval/Treat Not Completed: Patient at procedure or test/unavailable. Pt has been NPO for procedure today. Will reassess on next date.    Germain Osgood, M.A. CCC-SLP [phone]  Germain Osgood 05/01/2014, 3:39 PM

## 2014-05-01 NOTE — Procedures (Signed)
I was present at this session.  I have reviewed the session itself and made appropriate changes.  BP 90-110.  Cath flow 400.  Joyice Faster Devone Tousley 5/21/20154:35 PM

## 2014-05-02 DIAGNOSIS — I10 Essential (primary) hypertension: Secondary | ICD-10-CM

## 2014-05-02 LAB — CBC
HCT: 33.2 % — ABNORMAL LOW (ref 36.0–46.0)
HEMOGLOBIN: 10.6 g/dL — AB (ref 12.0–15.0)
MCH: 30.5 pg (ref 26.0–34.0)
MCHC: 31.9 g/dL (ref 30.0–36.0)
MCV: 95.4 fL (ref 78.0–100.0)
Platelets: 149 10*3/uL — ABNORMAL LOW (ref 150–400)
RBC: 3.48 MIL/uL — ABNORMAL LOW (ref 3.87–5.11)
RDW: 15.6 % — AB (ref 11.5–15.5)
WBC: 6.4 10*3/uL (ref 4.0–10.5)

## 2014-05-02 LAB — GLUCOSE, CAPILLARY
GLUCOSE-CAPILLARY: 75 mg/dL (ref 70–99)
Glucose-Capillary: 86 mg/dL (ref 70–99)
Glucose-Capillary: 115 mg/dL — ABNORMAL HIGH (ref 70–99)

## 2014-05-02 MED ORDER — HEPARIN SODIUM (PORCINE) 5000 UNIT/ML IJ SOLN
5000.0000 [IU] | Freq: Three times a day (TID) | INTRAMUSCULAR | Status: DC
  Administered 2014-05-02 – 2014-05-06 (×10): 5000 [IU] via SUBCUTANEOUS
  Filled 2014-05-02 (×15): qty 1

## 2014-05-02 NOTE — Plan of Care (Signed)
Problem: Phase I Progression Outcomes Goal: OOB as tolerated unless otherwise ordered Outcome: Progressing Patient sat in chair for 90 minutes today.

## 2014-05-02 NOTE — Progress Notes (Signed)
NUTRITION FOLLOW-UP  DOCUMENTATION CODES Per approved criteria  -Severe malnutrition in the context of chronic illness -Underweight   Pt meets criteria for severe MALNUTRITION in the context of chronic illness as evidenced by severe fat and muscle mass loss.  INTERVENTION: Agree with Regular, liberalized diet. Change Nepro Shake to pr prn. Continue 30 ml Prostat po BID. If aggressive nutrition therapy desired, recommend initiation of nutrition support given ongoing poor oral intake. RD to continue to follow nutrition care plan.  NUTRITION DIAGNOSIS: Increased nutrient needs related to wound healing and HD as evidenced by estimated needs. Ongoing.  Goal: Intake to meet >90% of estimated nutrition needs. Unmet.  Monitor:  weight trends, lab trends, I/O's, PO intake, supplement tolerance  ASSESSMENT: PMHx significant for dementia, HTN, stomach CA, ESRD (on HD). Admitted with BSE swelling x 3 weeks, recent blood clot in R leg. Work-up reveals DVT.  BSE completed by SLP with recommendations for Regular consistency diet and thin liquids. Placed on Regular, liberalized diet. Underwent IVC filter placement on 5/21.  Per chart, Dr. Jonnie Finner with renal team has requested family meeting, this is planned for today at 2:30 pm.  Currently ordered for Nepro Shake PO daily and 30 ml Prostat PO BID. Continues with orders for Remeron and Rena-Vit. RN reports that pt is hardly eating, taking bites of meals at best. Refusing Nepro however did take her Prostat this morning.  Sodium low at 136 Potassium low at 3.4 Phosphorus WNL CBG's: 54 - 81  Height: Ht Readings from Last 1 Encounters:  04/27/14 5' 1.81" (1.57 m)    Weight: Wt Readings from Last 1 Encounters:  05/01/14 89 lb 8.1 oz (40.6 kg)   Adjusted body weight using SBW equation: 49 kg   BMI:  Body mass index is 16.47 kg/(m^2). Underweight  Estimated Nutritional Needs: Kcal: 1450 - 1600 Protein: at least 73 g daily Fluid: 1.2  liters  Skin: Stage IV to sacrum Stage I to L heel Stage I to R heel Stage II to middle R heel  Diet Order: General     Intake/Output Summary (Last 24 hours) at 05/02/14 1021 Last data filed at 05/01/14 1906  Gross per 24 hour  Intake    145 ml  Output   1214 ml  Net  -1069 ml    Last BM: 5/17   Labs:   Recent Labs Lab 04/28/14 0640 04/29/14 0542 05/01/14 1257  NA 133* 131* 136*  K 4.3 4.3 3.4*  CL 99 98 99  CO2 23 24 25   BUN 37* 50* 39*  CREATININE 2.68* 3.40* 3.11*  CALCIUM 7.4* 7.3* 7.8*  PHOS  --   --  2.8  GLUCOSE 82 85 43*    CBG (last 3)   Recent Labs  05/01/14 1808 05/01/14 1837 05/01/14 1903  GLUCAP 54* 55* 81    Scheduled Meds: . amiodarone  100 mg Oral Daily  . antiseptic oral rinse  15 mL Mouth Rinse q12n4p  . atorvastatin  40 mg Oral QHS  . calcitRIOL  0.5 mcg Oral Q T,Th,Sat-1800  . calcium acetate  667 mg Oral TID WC  . carbamide peroxide  5 drop Both Ears BID  . chlorhexidine  15 mL Mouth Rinse BID  . [START ON 05/06/2014] darbepoetin (ARANESP) injection - DIALYSIS  40 mcg Intravenous Q Tue-HD  . dextrose  25 mL Intravenous Once  . feeding supplement (NEPRO CARB STEADY)  237 mL Oral Daily  . feeding supplement (PRO-STAT SUGAR FREE 64)  30 mL  Oral BID  . heparin subcutaneous  5,000 Units Subcutaneous 3 times per day  . levothyroxine  50 mcg Oral QAC breakfast  . mirtazapine  15 mg Oral QHS  . multivitamin  1 tablet Oral QHS  . pantoprazole  20 mg Oral Daily  . sodium chloride  3 mL Intravenous Q12H    Continuous Infusions:    Inda Coke MS, RD, LDN Inpatient Registered Dietitian Pager: [phone] After-hours pager: [phone]

## 2014-05-02 NOTE — Progress Notes (Signed)
Subjective:   No complaints. Eating breakfast  Objective Filed Vitals:   05/01/14 1707 05/01/14 1800 05/01/14 2033 05/02/14 0517  BP: 126/52 128/41 108/46 128/50  Pulse: 75 71 79 76  Temp: 98.2 F (36.8 C) 97.6 F (36.4 C) 98.1 F (36.7 C) 98.3 F (36.8 C)  TempSrc: Axillary Oral Oral Oral  Resp: 16 18 18 18   Height:      Weight: 40.9 kg (90 lb 2.7 oz)  40.6 kg (89 lb 8.1 oz)   SpO2: 98% 93% 94%    Physical Exam General: Alert, sitting up. Pleasantly confused Heart: RRR Lungs: CTA, dim, unlabored Abdomen: soft, nontender +BS Extremities: +2 LE edema, RUA edema.  Dialysis Access: R IJ perm cath and RUA AVF +bruit (placed 3/16)  Dialysis Orders: TTS @ [facility]  4 hr 43kgs 4K/2.25Ca 180 400/800 R IJ  No heparin 0.5 calcitriol No ESA or Fe (tsat 59 5/14)   Assessment/Plan:  1. DVT- R Lower ext. Subacute and L Lower ext chronic. IVC filter placed 5/21 2. ESRD - TTS @ [place].HD tomorrow On 4K bath- ck labs pre hd 3. Hypertension/volume - 128/50, wt 40.6 below EDW Has bipedal edema but with DVTs' will need to lower at DC. 4. Anemia - hgb 10.7, improving. start Aranesp 40 Q Tuesday. No Fe,moniter CBC 5. Metabolic bone disease - Corrected Ca+10. Phos 2.8. Cont calcitriol and phoslo 6. Nutrition - alb 1.3 .liberalizing Diet with poor po intake/ Nepro, multivit 7. Afib- amiodarone/ no coumadin sec. In past Gi bleed on apixiban 8. HO GI Bleed- stable hgb currently  9. Dispo- Seen in past by palliative care consult and Son wanted to continue on HD   Shelle Iron, NP [place] [phone] 05/02/2014,9:26 AM  LOS: 5 days   Pt seen, examined and agree w A/P as above.  Kelly Splinter MD pager [phone]    cell [phone] 05/02/2014, 1:02 PM  Additional Objective Labs: Basic Metabolic Panel:  Recent Labs Lab 04/28/14 0640 04/29/14 0542 05/01/14 1257  NA 133* 131* 136*  K 4.3 4.3 3.4*  CL 99 98 99  CO2 23 24 25   GLUCOSE 82 85 43*  BUN 37* 50* 39*  CREATININE  2.68* 3.40* 3.11*  CALCIUM 7.4* 7.3* 7.8*  PHOS  --   --  2.8   Liver Function Tests:  Recent Labs Lab 05/01/14 1257  ALBUMIN 1.3*   No results found for this basename: LIPASE, AMYLASE,  in the last 168 hours CBC:  Recent Labs Lab 04/27/14 1703 04/28/14 0640 04/29/14 0542 04/30/14 0805 05/01/14 0500  WBC 8.1 7.9 8.9 6.3 5.8  HGB 11.3* 9.9* 9.5* 9.7* 10.7*  HCT 35.9* 30.9* 29.6* 30.4* 32.1*  MCV 96.5 93.9 94.3 96.2 93.6  PLT 144* 168 181 155 200   Blood Culture    Component Value Date/Time   SDES URINE, CATHETERIZED 02/18/2014 1259   SPECREQUEST NONE 02/18/2014 1259   CULT  Value: ESCHERICHIA COLI Performed at [facility] 02/18/2014 1259   REPTSTATUS 02/20/2014 FINAL 02/18/2014 1259    Cardiac Enzymes: No results found for this basename: CKTOTAL, CKMB, CKMBINDEX, TROPONINI,  in the last 168 hours CBG:  Recent Labs Lab 05/01/14 1353 05/01/14 1430 05/01/14 1808 05/01/14 1837 05/01/14 1903  GLUCAP 69* 93 54* 55* 81   Iron Studies: No results found for this basename: IRON, TIBC, TRANSFERRIN, FERRITIN,  in the last 72 hours @lablastinr3 @ Studies/Results: Ir Ivc Filter Plmt / S&i /img Guid/mod Sed  05/02/2014   CLINICAL DATA:  Extensive  DVT. Anemia has occurred after anti coagulation. The source of the anemia is not readily available. The patient must be taken off anticoagulation.  EXAM: IVC FILTER,INFERIOR VENA CAVOGRAM  FLUOROSCOPY TIME:  1 min and 12 seconds.  MEDICATIONS AND MEDICAL HISTORY: Versed 0.5 mg, Fentanyl 25 mcg.  Additional Medications: None.  ANESTHESIA/SEDATION: Moderate sedation time: 10 minutes  CONTRAST:  25 cc Omnipaque 300  PROCEDURE: The procedure, risks, benefits, and alternatives were explained to the patient. Questions regarding the procedure were encouraged and answered. The patient understands and consents to the procedure.  The right neck was prepped with Betadine in a sterile fashion, and a sterile drape was applied covering the  operative field. A sterile gown and sterile gloves were used for the procedure.  The right internal jugular vein was noted to be patent initially with ultrasound. Under sonographic guidance, a micropuncture needle was inserted into the right internal jugular vein (Ultrasound image documentation was performed). It was removed over an 018 wire which was upsized to a [place]. The sheath was inserted over the wire and into the IVC. IVC venography was performed.  The temporary filter was then deployed in the infrarenal IVC. The sheath was removed and hemostasis was achieved with direct pressure.  FINDINGS: The image demonstrates placement of an IVC filter with its tip at the mid L1 vertebral body.  COMPLICATIONS: None  IMPRESSION: Successful infrarenal IVC filter placement. This is a temporary filter. It can be removed or remain in place to become permanent.   Electronically Signed   By: Maryclare Bean M.D.   On: 05/02/2014 08:40   Medications:   . amiodarone  100 mg Oral Daily  . antiseptic oral rinse  15 mL Mouth Rinse q12n4p  . atorvastatin  40 mg Oral QHS  . calcitRIOL  0.5 mcg Oral Q T,Th,Sat-1800  . calcium acetate  667 mg Oral TID WC  . carbamide peroxide  5 drop Both Ears BID  . chlorhexidine  15 mL Mouth Rinse BID  . [START ON 05/06/2014] darbepoetin (ARANESP) injection - DIALYSIS  40 mcg Intravenous Q Tue-HD  . dextrose  25 mL Intravenous Once  . feeding supplement (NEPRO CARB STEADY)  237 mL Oral Daily  . feeding supplement (PRO-STAT SUGAR FREE 64)  30 mL Oral BID  . heparin subcutaneous  5,000 Units Subcutaneous 3 times per day  . levothyroxine  50 mcg Oral QAC breakfast  . mirtazapine  15 mg Oral QHS  . multivitamin  1 tablet Oral QHS  . pantoprazole  20 mg Oral Daily  . sodium chloride  3 mL Intravenous Q12H

## 2014-05-02 NOTE — Evaluation (Signed)
Physical Therapy Evaluation Patient Details Name: [name] MRN: [id number] DOB: [birth year] Today's Date: 05/02/2014   History of Present Illness  Pt with ESRD and HD T Th and Sat.  Pt admitted with right DVT - IVC filter placed on 05-01-14. Unsure of prior status/living arrangements and pt not good historian and son not present for eval.  PMH includes CVA, HTN, GI bleed, dementia, right femur fracture 09 and left femur fracture in 13, lung cancer.  Clinical Impression  Pt unable to stand with walker with +1 assist.  Pt mod to max assist with getting from supine to sitting positoin.  Pt max assist stand pivot transfer to chair. Pt limited by ankle ROM and fear of falling.  Unsure of assist at home.  From my observations pt would benefit from SNF placement for mobilty and wound care    Follow Up Recommendations SNF;Home health PT (Unsure of prior status and available help at home.  Family coming for family conference with Engineer, site later today to discuss)    Equipment Recommendations   (unsure of equipment needs as family not present)    Recommendations for Other Services       Precautions / Restrictions Precautions Precautions: Fall Restrictions Weight Bearing Restrictions: No      Mobility  Bed Mobility Overal bed mobility: Needs Assistance Bed Mobility: Supine to Sit     Supine to sit: Max assist;Mod assist     General bed mobility comments:  (pt able to get legs off teh bed.  Needed help to lift shoulders and to get to upright.  Pt unable to scoot forward and put feet on floot.  Needed total assist to postion and then pt able to maintain static stiting  with UE support)  Transfers Overall transfer level: Needs assistance Equipment used: Rolling walker (2 wheeled)             General transfer comment: attempted stnading with RW from high bed but pt unable to get to erect posture - fear of falling/ stayed in flexed positoin (? due to tight heelcords).  Pt then  received stand pivot transfer with max assist - pt got to standing position and stood for 30 sec to allow stretch of heelcords.  pt then lowered into chair  Ambulation/Gait Ambulation/Gait assistance:  (Pt unabel to ambulate today with +1 assist)              Stairs            Wheelchair Mobility    Modified Rankin (Stroke Patients Only)       Balance                                             Pertinent Vitals/Pain Pt complaining of buttock pain from positioning in bed. I got pt up sitting with skin pressure relief and she said she felt better    Home Living                        Prior Function                 Hand Dominance        Extremity/Trunk Assessment               Lower Extremity Assessment: RLE deficits/detail;LLE deficits/detail RLE Deficits / Details:  (pt with grossly  3/5 strength - able to do long arc quad.   tight heelcords - DF to netural - on both legs)    Cervical / Trunk Assessment: Kyphotic  Communication   Communication:  (inconsistent and delayed with responses)  Cognition Arousal/Alertness: Awake/alert Behavior During Therapy: Flat affect Overall Cognitive Status: Difficult to assess       Memory: Decreased short-term memory              General Comments General comments (skin integrity, edema, etc.): Pt with flat affect and delayed responses.  unusre of prior status as pt with conflicting stories and not family present. Pt needed mod to max assist to get into the chair but happy to be up once up.  Pt with weak legs, tight heelcords and fear of falling that limit her.  Pt with patches on both feet and sacral area - pt positoned for pressure relief    Exercises Total Joint Exercises Long Arc Quad: AROM;Both;10 reps;Seated      Assessment/Plan    PT Assessment Patient needs continued PT services  PT Diagnosis Difficulty walking;Generalized weakness;Altered mental status   PT  Problem List Decreased strength;Decreased range of motion;Decreased activity tolerance;Decreased balance;Decreased mobility;Decreased cognition;Decreased knowledge of use of DME;Decreased safety awareness;Decreased skin integrity;Pain  PT Treatment Interventions DME instruction;Gait training;Therapeutic activities;Therapeutic exercise;Functional mobility training;Balance training;Patient/family education   PT Goals (Current goals can be found in the Care Plan section) Acute Rehab PT Goals PT Goal Formulation: Patient unable to participate in goal setting Time For Goal Achievement: 05/16/14 Potential to Achieve Goals: Fair    Frequency Min 3X/week   Barriers to discharge   unsure at this time as family not present    Co-evaluation               End of Session Equipment Utilized During Treatment: Gait belt Activity Tolerance: Treatment limited secondary to medical complications (Comment) Patient left: in chair;with call bell/phone within reach (pt nursing aide aware of transfer status) Nurse Communication: Mobility status         Time: 7588-3254 PT Time Calculation (min): 40 min   Charges:   PT Evaluation $Initial PT Evaluation Tier I: 1 Procedure PT Treatments $Therapeutic Activity: 23-37 mins   PT G Codes:          Loyal Buba 05/02/2014, 10:19 AM 05/02/2014   [name], PT

## 2014-05-02 NOTE — Progress Notes (Signed)
Patient moved back to bed from recliner. Patient had been in chair for approx 90 minutes and had almost slid out of the chair four times. Unsure if patient is doing this intentionally. Will continue to monitor.  Librado Guandique Donnie Aho, RN.

## 2014-05-02 NOTE — Progress Notes (Signed)
PROGRESS NOTE  [name] ZOX:[id number] DOB: September 24, 1926 DOA: 04/27/2014 PCP: Robyn Haber, MD  Interim summary  80 year old female with a history of dementia, atrial fibrillation, ESRD, stroke presented 5/17 with worsening bilateral lower extremity edema.Pt admitted 3/10-3/24 due to worsening anemia (Hgb 6.2) in the setting of UGI bleed and progressive renal failure (Cr 7). She was transfused with PRBCs. She was guaiac positive and underwent EGD with GI that was unrevealing. Anemia was felt to be multifactorial from slow GI losses in setting of coagulopathy (INR was 5.9 on admit), renal disease, malabsorption from prior GI surgery. GI recommended that the patient remain off of coumadin for at least 2 weeks and asked that risks vs benefits be weighed prior to resuming. She had AVF placed during her stay and hemodialysis was started. She was seen by Palliative Care and Full Code Status was continued. She was d/c to [facility].  Son took pt home from [place].     The patient was diagnosed with a right lower extremity DVT on 03/24/14. According to the son, there was worsening right lower extremity edema on the day of admission. Venous duplex  of the lower extremity at [facility] showed acute to subacute DVT of the right common femoral vein with subacute DVT of the right femoral and popliteal vein as well as chronic DVT in the left lower extremity. The patient was started on apixiban approximately on 04/10/2014.   After extensive review, it is felt that actually patient's DVT was not acute and this was more subacute from previous. After some discussion with patient's son, he decided on 5/20 to avoid anticoagulation and went with IVC filter.  Interventional radiology placed filter on 5/21.  Assessment/Plan:  DVT bilateral lower extremities  -Venous duplex shows acute to subacute DVT right common femoral vein, subacute DVT a right femoral and popliteal vein; chronic DVT left lower extremity    -According to medical record, patient was started on apixiban approximately 04/10/2014  -spoke with Dr. Bubba Camp (PCP) 5/18 who did not feel pt had started apixiban as of 5/13 office visit as pt did not have the bottle of medicine -5/18-Attempted to contact the patient's sonx3--no answer; no ability to leave voicemail  -finally spoke to son on phone 5/19 -[facility] duplex report essentially unchanged from venous duplex performed on 03/24/2014  -As the patient has history of recent GI bleed and ESRD, hesitate to restart apixiban  - Dr. Carles Collet had long discussion with the patient's son regarding risks benefits of long-term anticoagulation--son has decided on IVC filter, which was placed by interventional radiology on 5/21 -No present evidence of phlegmasia alba on physical examination--extremities are warm with popliteal pulses present bilateral  -Heparin drip discontinued after filter placed  ESRD  -Consult nephrology for maintenance dialysis  -Dialyzes Tuesday, Thursday, Saturday  - Dr. Jonnie Finner has requested family meeting for 05/02/14.  Paroxysmal atrial fibrillation  -CHADS-VASc=6  -Anticoagulation previously discontinued due to GI bleed with hemoglobin down to 6  -Presently in sinus  -Continue amiodarone   Hypothyroidism  -Continue Synthroid   Hyperlipidemia  -Continue statin   Hypertension - Controlled.  Anemia - Stable  Hypoglycemia - Low CBGs on 5/21. Not on hypoglycemics.? Secondary to renal failure and poor oral intake. Continue to monitor CBGs.  Family Communication: No family at bedside Disposition Plan: Home in the next few days   Procedures/Studies: Dg Chest 2 View  04/27/2014   CLINICAL DATA:  Productive cough  EXAM: CHEST  2 VIEW  COMPARISON:  None.  FINDINGS: The heart size and mediastinal contours are stable. There is no focal infiltrate or pulmonary edema. There are small bilateral pleural effusions. The lungs are hyperinflated. Dual-lumen right central venous line is  unchanged. The visualized skeletal structures are stable.  IMPRESSION: No focal pneumonia.  Small bilateral pleural effusions.  Emphysema.   Electronically Signed   By: Abelardo Diesel M.D.   On: 04/27/2014 16:19         Subjective: Patient denies complaints. Denies pain. Wants to get out of bed.  Objective: Filed Vitals:   05/01/14 1800 05/01/14 2033 05/02/14 0517 05/02/14 0920  BP: 128/41 108/46 128/50 122/65  Pulse: 71 79 76 85  Temp: 97.6 F (36.4 C) 98.1 F (36.7 C) 98.3 F (36.8 C) 98.7 F (37.1 C)  TempSrc: Oral Oral Oral Oral  Resp: 18 18 18 18   Height:      Weight:  40.6 kg (89 lb 8.1 oz)    SpO2: 93% 94%      Intake/Output Summary (Last 24 hours) at 05/02/14 1103 Last data filed at 05/01/14 1906  Gross per 24 hour  Intake    145 ml  Output   1214 ml  Net  -1069 ml   Weight change: 1.1 kg (2 lb 6.8 oz) Exam:   General:  Alert and oriented x2. no acute distress. Pleasant and comfortable elderly frail female lying comfortably supine in bed.  Cardiovascular: RRR, S1/S2  Respiratory: CTA bilaterally  Abdomen: Soft, nontender, nondistended, hypoactive bowel sounds  Extremities: No clubbing or cyanosis, 2+ pitting edema from knees down bilaterally  Data Reviewed: Basic Metabolic Panel:  Recent Labs Lab 04/27/14 1703 04/28/14 0640 04/29/14 0542 05/01/14 1257  NA 134* 133* 131* 136*  K 4.3 4.3 4.3 3.4*  CL 97 99 98 99  CO2 27 23 24 25   GLUCOSE 81 82 85 43*  BUN 30* 37* 50* 39*  CREATININE 2.30* 2.68* 3.40* 3.11*  CALCIUM 7.7* 7.4* 7.3* 7.8*  PHOS  --   --   --  2.8   Liver Function Tests:  Recent Labs Lab 05/01/14 1257  ALBUMIN 1.3*   No results found for this basename: LIPASE, AMYLASE,  in the last 168 hours No results found for this basename: AMMONIA,  in the last 168 hours CBC:  Recent Labs Lab 04/27/14 1703 04/28/14 0640 04/29/14 0542 04/30/14 0805 05/01/14 0500  WBC 8.1 7.9 8.9 6.3 5.8  HGB 11.3* 9.9* 9.5* 9.7* 10.7*  HCT  35.9* 30.9* 29.6* 30.4* 32.1*  MCV 96.5 93.9 94.3 96.2 93.6  PLT 144* 168 181 155 200   Cardiac Enzymes: No results found for this basename: CKTOTAL, CKMB, CKMBINDEX, TROPONINI,  in the last 168 hours BNP: No components found with this basename: POCBNP,  CBG:  Recent Labs Lab 05/01/14 1353 05/01/14 1430 05/01/14 1808 05/01/14 1837 05/01/14 1903  GLUCAP 69* 93 54* 55* 81    Recent Results (from the past 240 hour(s))  MRSA PCR SCREENING     Status: None   Collection Time    04/27/14  9:57 PM      Result Value Ref Range Status   MRSA by PCR NEGATIVE  NEGATIVE Final   Comment:            The GeneXpert MRSA Assay (FDA     approved for NASAL specimens     only), is one component of a     comprehensive MRSA colonization     surveillance program.  It is not     intended to diagnose MRSA     infection nor to guide or     monitor treatment for     MRSA infections.     Scheduled Meds: . amiodarone  100 mg Oral Daily  . antiseptic oral rinse  15 mL Mouth Rinse q12n4p  . atorvastatin  40 mg Oral QHS  . calcitRIOL  0.5 mcg Oral Q T,Th,Sat-1800  . calcium acetate  667 mg Oral TID WC  . carbamide peroxide  5 drop Both Ears BID  . chlorhexidine  15 mL Mouth Rinse BID  . [START ON 05/06/2014] darbepoetin (ARANESP) injection - DIALYSIS  40 mcg Intravenous Q Tue-HD  . dextrose  25 mL Intravenous Once  . feeding supplement (PRO-STAT SUGAR FREE 64)  30 mL Oral BID  . heparin subcutaneous  5,000 Units Subcutaneous 3 times per day  . levothyroxine  50 mcg Oral QAC breakfast  . mirtazapine  15 mg Oral QHS  . multivitamin  1 tablet Oral QHS  . pantoprazole  20 mg Oral Daily  . sodium chloride  3 mL Intravenous Q12H   Continuous Infusions:    Modena Jansky, MD, FACP, [facility]. [facility] Pager [phone]  If 7PM-7AM, please contact night-coverage [email or web address] Password [email or web address] 05/02/2014, 11:13 AM   LOS: 5 days

## 2014-05-02 NOTE — Progress Notes (Signed)
Speech Language Pathology Treatment: Dysphagia  Patient Details Name: [name] MRN: [id number] DOB: August 19, 1926 Today's Date: 05/02/2014 Time: 9242-6834 SLP Time Calculation (min): 21 min  Assessment / Plan / Recommendation Clinical Impression  Pt demonstrated increased signs of aspiration today, including a delayed and immediate cough with small cup sips of thin liquid. Pt was unable to effectively perform a chin tuck despite Max cueing. Cup sips of nectar thick liquids appeared to increase airway protection, as no further s/s of aspiration were observed. Will downgrade to nectar thick liquids at this time, with continued SLP f/u for tolerance and possible advancement.   HPI HPI: [name] is a 80 y.o. female who presents with son to ED with BLE swelling for past 3 weeks.  Diagnosed with blood clot in R leg about 3 weeks ago, started on elequis. Despite elequis her swelling seemed worse to son when he woke her up 04/27/14. Patient has no CP no SOB. Pt has a h/o dysphagia requiring altered solids/liquids, with most recent MBS 10/16/13 recommending regular textures and thin liquids with a chin tuck. Pt reports that she has no longer been utilizing a chin tuck, and there is no evidence of PNA on CXR.   Pertinent Vitals afebrile  SLP Plan  Continue with current plan of care    Recommendations Diet recommendations: Regular;Nectar-thick liquid Liquids provided via: Cup;No straw Medication Administration: Whole meds with puree Supervision: Patient able to self feed;Full supervision/cueing for compensatory strategies Compensations: Slow rate;Small sips/bites Postural Changes and/or Swallow Maneuvers: Seated upright 90 degrees       Oral Care Recommendations: Oral care BID Follow up Recommendations: Home health SLP Plan: Continue with current plan of care    GO      Germain Osgood, M.A. CCC-SLP [phone]  Germain Osgood 05/02/2014, 1:33 PM

## 2014-05-03 DIAGNOSIS — D649 Anemia, unspecified: Secondary | ICD-10-CM

## 2014-05-03 LAB — CBC
HCT: 32.7 % — ABNORMAL LOW (ref 36.0–46.0)
Hemoglobin: 10.3 g/dL — ABNORMAL LOW (ref 12.0–15.0)
MCH: 30.4 pg (ref 26.0–34.0)
MCHC: 31.5 g/dL (ref 30.0–36.0)
MCV: 96.5 fL (ref 78.0–100.0)
PLATELETS: 135 10*3/uL — AB (ref 150–400)
RBC: 3.39 MIL/uL — ABNORMAL LOW (ref 3.87–5.11)
RDW: 15.8 % — AB (ref 11.5–15.5)
WBC: 7.3 10*3/uL (ref 4.0–10.5)

## 2014-05-03 LAB — RENAL FUNCTION PANEL
Albumin: 1.3 g/dL — ABNORMAL LOW (ref 3.5–5.2)
BUN: 34 mg/dL — AB (ref 6–23)
CHLORIDE: 98 meq/L (ref 96–112)
CO2: 27 meq/L (ref 19–32)
Calcium: 7.8 mg/dL — ABNORMAL LOW (ref 8.4–10.5)
Creatinine, Ser: 3.27 mg/dL — ABNORMAL HIGH (ref 0.50–1.10)
GFR calc Af Amer: 14 mL/min — ABNORMAL LOW (ref >90)
GFR calc non Af Amer: 12 mL/min — ABNORMAL LOW (ref >90)
Glucose, Bld: 108 mg/dL — ABNORMAL HIGH (ref 70–99)
Phosphorus: 2.3 mg/dL (ref 2.3–4.6)
Potassium: 3.4 mEq/L — ABNORMAL LOW (ref 3.7–5.3)
Sodium: 134 mEq/L — ABNORMAL LOW (ref 137–147)

## 2014-05-03 LAB — GLUCOSE, CAPILLARY
Glucose-Capillary: 94 mg/dL (ref 70–99)
Glucose-Capillary: 78 mg/dL (ref 70–99)
Glucose-Capillary: 101 mg/dL — ABNORMAL HIGH (ref 70–99)

## 2014-05-03 MED ORDER — SODIUM CHLORIDE 0.9 % IV SOLN: 100.0000 mL | INTRAVENOUS | Status: DC | PRN

## 2014-05-03 MED ORDER — HEPARIN SODIUM (PORCINE) 1000 UNIT/ML DIALYSIS: 1000.0000 [IU] | INTRAMUSCULAR | Status: DC | PRN

## 2014-05-03 MED ORDER — LIDOCAINE HCL (PF) 1 % IJ SOLN: 5.0000 mL | INTRAMUSCULAR | Status: DC | PRN

## 2014-05-03 MED ORDER — PENTAFLUOROPROP-TETRAFLUOROETH EX AERO: 1.0000 "application " | INHALATION_SPRAY | CUTANEOUS | Status: DC | PRN

## 2014-05-03 MED ORDER — NEPRO/CARBSTEADY PO LIQD
237.0000 mL | ORAL | Status: DC | PRN
  Filled 2014-05-03: qty 237

## 2014-05-03 MED ORDER — ALTEPLASE 2 MG IJ SOLR
2.0000 mg | Freq: Once | INTRAMUSCULAR | Status: DC | PRN
  Filled 2014-05-03: qty 2

## 2014-05-03 MED ORDER — COLLAGENASE 250 UNIT/GM EX OINT
TOPICAL_OINTMENT | Freq: Every day | CUTANEOUS | Status: DC
  Administered 2014-05-04 – 2014-05-06 (×3): via TOPICAL
  Filled 2014-05-03 (×2): qty 30

## 2014-05-03 MED ORDER — LIDOCAINE-PRILOCAINE 2.5-2.5 % EX CREA
1.0000 "application " | TOPICAL_CREAM | CUTANEOUS | Status: DC | PRN
  Filled 2014-05-03: qty 5

## 2014-05-03 NOTE — Progress Notes (Signed)
Subjective:   No complaints  Objective Filed Vitals:   05/02/14 1717 05/02/14 1901 05/02/14 2146 05/03/14 0552  BP: 135/70  110/58 138/59  Pulse: 89  74 78  Temp: 98.1 F (36.7 C) 98.4 F (36.9 C) 98.3 F (36.8 C) 98.6 F (37 C)  TempSrc: Oral  Oral Oral  Resp: 18  18 16   Height:      Weight:   41.6 kg (91 lb 11.4 oz)   SpO2: 96%  94% 93%   Physical Exam General: no distress, thin, emaciated Heart: RRR Lungs: CTA, dim, unlabored Abdomen: soft, nontender +BS Extremities: +2 LE edema, RUA edema.  Dialysis Access: R IJ perm cath and RUA AVF +bruit (placed 3/16)  Dialysis Orders: TTS @ [facility]  4 hr 43kgs 4K/2.25Ca 180 400/800 R IJ  No heparin 0.5 calcitriol No ESA or Fe (tsat 59 5/14)   Assessment/Plan:  1. DVT- R Lower ext. Subacute and L Lower ext chronic. IVC filter placed 5/21. No AC due to hx GIB 2. ESRD - on HD 3. Hypertension/volume - 128/50, wt 40.6 below EDW Has bipedal edema with DVTS. Lower dry wt at DC. 4. Anemia - hgb 10.7, improving. start Aranesp 40 Q Tuesday. No Fe,moniter CBC 5. Metabolic bone disease - Corrected Ca+10. Phos 2.8. Cont calcitriol and phoslo 6. Nutrition - alb 1.3 .liberalizing Diet with poor po intake/ Nepro, multivit 7. Afib- amiodarone/ no coumadin sec. In past Gi bleed on apixiban 8. HO GI Bleed- stable hgb currently 9. Stage IV sacral decub- this is new , the exit wound is small but probably deep ulcer with necrosis judging by the odor. WC consult ordered 10. EOL- I had family meeting with son and daughter yesterday. Pt has very poor prognosis, poor QOL and progressive FTT in spite of initiation of HD which hasn't helped. Pt now DNR. Consideration of comfort care is in process.   Kelly Splinter MD pager [phone]    cell [phone] 05/03/2014, 9:00 AM  Additional Objective Labs: Basic Metabolic Panel:  Recent Labs Lab 04/28/14 0640 04/29/14 0542 05/01/14 1257  NA 133* 131* 136*  K 4.3 4.3 3.4*  CL 99 98 99  CO2 23 24 25   GLUCOSE 82  85 43*  BUN 37* 50* 39*  CREATININE 2.68* 3.40* 3.11*  CALCIUM 7.4* 7.3* 7.8*  PHOS  --   --  2.8   Liver Function Tests:  Recent Labs Lab 05/01/14 1257  ALBUMIN 1.3*   No results found for this basename: LIPASE, AMYLASE,  in the last 168 hours CBC:  Recent Labs Lab 04/29/14 0542 04/30/14 0805 05/01/14 0500 05/02/14 1300 05/03/14 0720  WBC 8.9 6.3 5.8 6.4 7.3  HGB 9.5* 9.7* 10.7* 10.6* 10.3*  HCT 29.6* 30.4* 32.1* 33.2* 32.7*  MCV 94.3 96.2 93.6 95.4 96.5  PLT 181 155 200 149* 135*   Blood Culture    Component Value Date/Time   SDES URINE, CATHETERIZED 02/18/2014 1259   SPECREQUEST NONE 02/18/2014 1259   CULT  Value: ESCHERICHIA COLI Performed at [facility] 02/18/2014 1259   REPTSTATUS 02/20/2014 FINAL 02/18/2014 1259    Cardiac Enzymes: No results found for this basename: CKTOTAL, CKMB, CKMBINDEX, TROPONINI,  in the last 168 hours CBG:  Recent Labs Lab 05/01/14 1903 05/02/14 1131 05/02/14 1734 05/02/14 2255 05/03/14 0744  GLUCAP 81 86 75 115* 94   Iron Studies: No results found for this basename: IRON, TIBC, TRANSFERRIN, FERRITIN,  in the last 72 hours @lablastinr3 @ Studies/Results: Ir Ivc Filter Plmt / S&i /  img Guid/mod Sed  05/02/2014   CLINICAL DATA:  Extensive DVT. Anemia has occurred after anti coagulation. The source of the anemia is not readily available. The patient must be taken off anticoagulation.  EXAM: IVC FILTER,INFERIOR VENA CAVOGRAM  FLUOROSCOPY TIME:  1 min and 12 seconds.  MEDICATIONS AND MEDICAL HISTORY: Versed 0.5 mg, Fentanyl 25 mcg.  Additional Medications: None.  ANESTHESIA/SEDATION: Moderate sedation time: 10 minutes  CONTRAST:  25 cc Omnipaque 300  PROCEDURE: The procedure, risks, benefits, and alternatives were explained to the patient. Questions regarding the procedure were encouraged and answered. The patient understands and consents to the procedure.  The right neck was prepped with Betadine in a sterile fashion, and a sterile  drape was applied covering the operative field. A sterile gown and sterile gloves were used for the procedure.  The right internal jugular vein was noted to be patent initially with ultrasound. Under sonographic guidance, a micropuncture needle was inserted into the right internal jugular vein (Ultrasound image documentation was performed). It was removed over an 018 wire which was upsized to a Pala. The sheath was inserted over the wire and into the IVC. IVC venography was performed.  The temporary filter was then deployed in the infrarenal IVC. The sheath was removed and hemostasis was achieved with direct pressure.  FINDINGS: The image demonstrates placement of an IVC filter with its tip at the mid L1 vertebral body.  COMPLICATIONS: None  IMPRESSION: Successful infrarenal IVC filter placement. This is a temporary filter. It can be removed or remain in place to become permanent.   Electronically Signed   By: Maryclare Bean M.D.   On: 05/02/2014 08:40   Medications:   . amiodarone  100 mg Oral Daily  . antiseptic oral rinse  15 mL Mouth Rinse q12n4p  . atorvastatin  40 mg Oral QHS  . calcitRIOL  0.5 mcg Oral Q T,Th,Sat-1800  . calcium acetate  667 mg Oral TID WC  . carbamide peroxide  5 drop Both Ears BID  . chlorhexidine  15 mL Mouth Rinse BID  . [START ON 05/06/2014] darbepoetin (ARANESP) injection - DIALYSIS  40 mcg Intravenous Q Tue-HD  . dextrose  25 mL Intravenous Once  . feeding supplement (PRO-STAT SUGAR FREE 64)  30 mL Oral BID  . heparin subcutaneous  5,000 Units Subcutaneous 3 times per day  . levothyroxine  50 mcg Oral QAC breakfast  . mirtazapine  15 mg Oral QHS  . multivitamin  1 tablet Oral QHS  . pantoprazole  20 mg Oral Daily  . sodium chloride  3 mL Intravenous Q12H

## 2014-05-03 NOTE — Consult Note (Addendum)
WOC wound consult note Reason for Consult: Unstageable Pressure Ulcer (POA) to sacrum. Documentation indicates that this ulceration was first noted on 02/22/14 and that it is approximately the same size then as it is now. Patient seen by my partner on 04/28/14 and wound has changed in appearance since that time and is no longer 90% red. FOul odor persists. Patient is seen in HD today with the assistance of the HD nurse. Wound type:Pressure Pressure Ulcer POA: Yes Measurement:  2cm x 1.0cm x indeterminate depth.  Depth is obscured by the presence of necrotic tissue. Wound bed: 90% covered by the presence of grey/black soft eschar and 10% (at periphery) of wound is red and moist Drainage (amount, consistency, odor) the wound presents with a strong odor, perhaps due to the autolytically debriding eschar and the fact that the soft silicone foam dressing is wet with urine. This will be changed now (along with the wet underpad beneath her) by the HD nurse. Periwound: Intact, macerated Dressing procedure/placement/frequency: I will discontinue the silver hydrofiber dressing currently in place and implement an enzymatic debriding agent (collagenase/Santyl) for use daily in conjunction with repositioning off of her back at all times with the exception of meals.  A mattress replacement is provided to allow for drying of tissues and to further redistribute pressure on this thin and frail patient.  Bilateral pressure redistribution boots are provided as well and for the same reason; the Stage I pressure ulcer on the R heel has resolved, but she remains at high risk for PrU formation.  The soft siliceon dressing to this heel is continued.  Guidance for timely incontinence care is provided with recommendation to use our house incontince care products (cleanser, moisturizer/conditioniner and skin protectant). [place] nursing team will not follow routinely, but will remain available to this patient, the nursing and medical  teams.  Please re-consult if needed or if wound status is not consistent with patient's overall status. Thanks, Maudie Flakes, MSN, RN, [place], [place], [place] [phone])

## 2014-05-03 NOTE — Progress Notes (Signed)
PROGRESS NOTE  [name] SVX:[id number] DOB: 1926-02-01 DOA: 04/27/2014 PCP: Robyn Haber, MD  Interim summary  80 year old female with a history of dementia, atrial fibrillation, ESRD, stroke presented 5/17 with worsening bilateral lower extremity edema.Pt admitted 3/10-3/24 due to worsening anemia (Hgb 6.2) in the setting of UGI bleed and progressive renal failure (Cr 7). She was transfused with PRBCs. She was guaiac positive and underwent EGD with GI that was unrevealing. Anemia was felt to be multifactorial from slow GI losses in setting of coagulopathy (INR was 5.9 on admit), renal disease, malabsorption from prior GI surgery. GI recommended that the patient remain off of coumadin for at least 2 weeks and asked that risks vs benefits be weighed prior to resuming. She had AVF placed during her stay and hemodialysis was started. She was seen by Palliative Care and Full Code Status was continued. She was d/c to [facility].  Son took pt home from [place].     The patient was diagnosed with a right lower extremity DVT on 03/24/14. According to the son, there was worsening right lower extremity edema on the day of admission. Venous duplex  of the lower extremity at [facility] showed acute to subacute DVT of the right common femoral vein with subacute DVT of the right femoral and popliteal vein as well as chronic DVT in the left lower extremity. The patient was started on apixiban approximately on 04/10/2014.   After extensive review, it is felt that actually patient's DVT was not acute and this was more subacute from previous. After some discussion with patient's son, he decided on 5/20 to avoid anticoagulation and went with IVC filter.  Interventional radiology placed filter on 5/21.  Assessment/Plan:  DVT bilateral lower extremities  -Venous duplex shows acute to subacute DVT right common femoral vein, subacute DVT a right femoral and popliteal vein; chronic DVT left lower extremity    -According to medical record, patient was started on apixiban approximately 04/10/2014  -spoke with Dr. Bubba Camp (PCP) 5/18 who did not feel pt had started apixiban as of 5/13 office visit as pt did not have the bottle of medicine -5/18-Attempted to contact the patient's sonx3--no answer; no ability to leave voicemail  -finally spoke to son on phone 5/19 -[facility] duplex report essentially unchanged from venous duplex performed on 03/24/2014  -As the patient has history of recent GI bleed and ESRD, hesitate to restart apixiban  - Dr. Carles Collet had long discussion with the patient's son regarding risks benefits of long-term anticoagulation--son has decided on IVC filter, which was placed by interventional radiology on 5/21 -No present evidence of phlegmasia alba on physical examination--extremities are warm with popliteal pulses present bilateral  -Heparin drip discontinued after filter placed  ESRD on HD -Consult nephrology for maintenance dialysis  -Dialyzes Tuesday, Thursday, Saturday  - Dr. Jonnie Finner met with the family including son and daughter on 5/21 and outlined patient's very poor prognosis, poor quality of life and progressive failure to thrive in spite of initiation of hemodialysis which hasn't helped. Family is instead changed CODE STATUS to DO NOT RESUSCITATE and patient's son today indicated to me that he is strongly considering [facility] after today's dialysis.  Paroxysmal atrial fibrillation  -CHADS-VASc=6  -Anticoagulation previously discontinued due to GI bleed with hemoglobin down to 6  -Presently in sinus  -Continue amiodarone   Hypothyroidism  -Continue Synthroid   Hyperlipidemia  -Continue statin   Hypertension - Controlled.  Anemia - Stable  Hypoglycemia - Low CBGs on 5/21. Not on hypoglycemics.? Secondary to renal failure and poor oral intake. Continue to monitor CBGs-No further episodes.  Failure to thrive  Sacral decubitus ulcer - Wound care consultation  Family  Communication:  discussed with son at bedside.  Disposition Plan: Home in the next few days   Procedures/Studies: Dg Chest 2 View  04/27/2014   CLINICAL DATA:  Productive cough  EXAM: CHEST  2 VIEW  COMPARISON:  None.  FINDINGS: The heart size and mediastinal contours are stable. There is no focal infiltrate or pulmonary edema. There are small bilateral pleural effusions. The lungs are hyperinflated. Dual-lumen right central venous line is unchanged. The visualized skeletal structures are stable.  IMPRESSION: No focal pneumonia.  Small bilateral pleural effusions.  Emphysema.   Electronically Signed   By: Abelardo Diesel M.D.   On: 04/27/2014 16:19         Subjective: Patient denies complaints.   Objective: Filed Vitals:   05/02/14 1901 05/02/14 2146 05/03/14 0552 05/03/14 0931  BP:  110/58 138/59 155/48  Pulse:  74 78 92  Temp: 98.4 F (36.9 C) 98.3 F (36.8 C) 98.6 F (37 C) 98.9 F (37.2 C)  TempSrc:  Oral Oral Oral  Resp:  '18 16 16  ' Height:      Weight:  41.6 kg (91 lb 11.4 oz)    SpO2:  94% 93% 96%    Intake/Output Summary (Last 24 hours) at 05/03/14 1200 Last data filed at 05/02/14 2300  Gross per 24 hour  Intake    180 ml  Output      0 ml  Net    180 ml   Weight change: -1.6 kg (-3 lb 8.4 oz) Exam:   General:  Alert and oriented x2. no acute distress. Pleasant and comfortable elderly frail female lying comfortably supine in bed.  Cardiovascular: RRR, S1/S2  Respiratory: CTA bilaterally  Abdomen: Soft, nontender, nondistended, hypoactive bowel sounds  Extremities: No clubbing or cyanosis, 2+ pitting edema from knees down bilaterally  Skin: Approximately 2 cm diameter stage III mid sacral decubitus with minimal surrounding erythema and tenderness. No obvious drainage.   Data Reviewed: Basic Metabolic Panel:  Recent Labs Lab 04/27/14 1703 04/28/14 0640 04/29/14 0542 05/01/14 1257  NA 134* 133* 131* 136*  K 4.3 4.3 4.3 3.4*  CL 97 99 98 99  CO2  '27 23 24 25  ' GLUCOSE 81 82 85 43*  BUN 30* 37* 50* 39*  CREATININE 2.30* 2.68* 3.40* 3.11*  CALCIUM 7.7* 7.4* 7.3* 7.8*  PHOS  --   --   --  2.8   Liver Function Tests:  Recent Labs Lab 05/01/14 1257  ALBUMIN 1.3*   No results found for this basename: LIPASE, AMYLASE,  in the last 168 hours No results found for this basename: AMMONIA,  in the last 168 hours CBC:  Recent Labs Lab 04/29/14 0542 04/30/14 0805 05/01/14 0500 05/02/14 1300 05/03/14 0720  WBC 8.9 6.3 5.8 6.4 7.3  HGB 9.5* 9.7* 10.7* 10.6* 10.3*  HCT 29.6* 30.4* 32.1* 33.2* 32.7*  MCV 94.3 96.2 93.6 95.4 96.5  PLT 181 155 200 149* 135*   Cardiac Enzymes: No results found for this basename: CKTOTAL, CKMB, CKMBINDEX, TROPONINI,  in the last 168 hours BNP: No components found with this basename: POCBNP,  CBG:  Recent Labs Lab 05/01/14 1903 05/02/14 1131 05/02/14 1734 05/02/14 2255 05/03/14 0744  GLUCAP 81 86 75 115* 94    Recent Results (from the past  240 hour(s))  MRSA PCR SCREENING     Status: None   Collection Time    04/27/14  9:57 PM      Result Value Ref Range Status   MRSA by PCR NEGATIVE  NEGATIVE Final   Comment:            The GeneXpert MRSA Assay (FDA     approved for NASAL specimens     only), is one component of a     comprehensive MRSA colonization     surveillance program. It is not     intended to diagnose MRSA     infection nor to guide or     monitor treatment for     MRSA infections.     Scheduled Meds: . amiodarone  100 mg Oral Daily  . antiseptic oral rinse  15 mL Mouth Rinse q12n4p  . atorvastatin  40 mg Oral QHS  . calcitRIOL  0.5 mcg Oral Q T,Th,Sat-1800  . calcium acetate  667 mg Oral TID WC  . carbamide peroxide  5 drop Both Ears BID  . chlorhexidine  15 mL Mouth Rinse BID  . [START ON 05/06/2014] darbepoetin (ARANESP) injection - DIALYSIS  40 mcg Intravenous Q Tue-HD  . dextrose  25 mL Intravenous Once  . feeding supplement (PRO-STAT SUGAR FREE 64)  30 mL Oral  BID  . heparin subcutaneous  5,000 Units Subcutaneous 3 times per day  . levothyroxine  50 mcg Oral QAC breakfast  . mirtazapine  15 mg Oral QHS  . multivitamin  1 tablet Oral QHS  . pantoprazole  20 mg Oral Daily  . sodium chloride  3 mL Intravenous Q12H   Continuous Infusions:    Modena Jansky, MD, FACP, [facility]. [facility] Pager [phone]  If 7PM-7AM, please contact night-coverage [email or web address] Password [email or web address] 05/03/2014, 12:00 PM   LOS: 6 days

## 2014-05-04 LAB — GLUCOSE, CAPILLARY
GLUCOSE-CAPILLARY: 87 mg/dL (ref 70–99)
GLUCOSE-CAPILLARY: 67 mg/dL — AB (ref 70–99)
GLUCOSE-CAPILLARY: 83 mg/dL (ref 70–99)
Glucose-Capillary: 81 mg/dL (ref 70–99)

## 2014-05-04 LAB — CBC
HCT: 26.4 % — ABNORMAL LOW (ref 36.0–46.0)
HEMOGLOBIN: 9 g/dL — AB (ref 12.0–15.0)
MCH: 31.4 pg (ref 26.0–34.0)
MCHC: 34.1 g/dL (ref 30.0–36.0)
MCV: 92 fL (ref 78.0–100.0)
PLATELETS: 144 10*3/uL — AB (ref 150–400)
RBC: 2.87 MIL/uL — AB (ref 3.87–5.11)
RDW: 15.5 % (ref 11.5–15.5)
WBC: 8.7 10*3/uL (ref 4.0–10.5)

## 2014-05-04 NOTE — Progress Notes (Signed)
PROGRESS NOTE  [name]:[id number] DOB: 1925-12-19 DOA: 04/27/2014 PCP: Robyn Haber, MD  Interim summary  80 year old female with a history of dementia, atrial fibrillation, ESRD, stroke presented 5/17 with worsening bilateral lower extremity edema.Pt admitted 3/10-3/24 due to worsening anemia (Hgb 6.2) in the setting of UGI bleed and progressive renal failure (Cr 7). She was transfused with PRBCs. She was guaiac positive and underwent EGD with GI that was unrevealing. Anemia was felt to be multifactorial from slow GI losses in setting of coagulopathy (INR was 5.9 on admit), renal disease, malabsorption from prior GI surgery. GI recommended that the patient remain off of coumadin for at least 2 weeks and asked that risks vs benefits be weighed prior to resuming. She had AVF placed during her stay and hemodialysis was started. She was seen by Palliative Care and Full Code Status was continued. She was d/c to [facility].  Son took pt home from [place].     The patient was diagnosed with a right lower extremity DVT on 03/24/14. According to the son, there was worsening right lower extremity edema on the day of admission. Venous duplex  of the lower extremity at [facility] showed acute to subacute DVT of the right common femoral vein with subacute DVT of the right femoral and popliteal vein as well as chronic DVT in the left lower extremity. The patient was started on apixiban approximately on 04/10/2014.   After extensive review, it is felt that actually patient's DVT was not acute and this was more subacute from previous. After some discussion with patient's son, he decided on 5/20 to avoid anticoagulation and went with IVC filter.  Interventional radiology placed filter on 5/21.  Palliative care discussion was done by Dr.Schertz, Nephrology and reiterated by this MD. Son was receptive- changed to DNR and stated no further HD after last one 5/23. Palliative care consulted on 5/24 for  hospice transition.  Assessment/Plan:  DVT bilateral lower extremities  -Venous duplex shows acute to subacute DVT right common femoral vein, subacute DVT a right femoral and popliteal vein; chronic DVT left lower extremity  -According to medical record, patient was started on apixiban approximately 04/10/2014  -spoke with Dr. Bubba Camp (PCP) 5/18 who did not feel pt had started apixiban as of 5/13 office visit as pt did not have the bottle of medicine -5/18-Attempted to contact the patient's sonx3--no answer; no ability to leave voicemail  -finally spoke to son on phone 5/19 -[facility] duplex report essentially unchanged from venous duplex performed on 03/24/2014  -As the patient has history of recent GI bleed and ESRD, hesitate to restart apixiban  - Dr. Carles Collet had long discussion with the patient's son regarding risks benefits of long-term anticoagulation--son has decided on IVC filter, which was placed by interventional radiology on 5/21 -No present evidence of phlegmasia alba on physical examination--extremities are warm with popliteal pulses present bilateral  -Heparin drip discontinued after filter placed  ESRD on HD -Consult nephrology for maintenance dialysis  -Dialyzes Tuesday, Thursday, Saturday  - Dr. Jonnie Finner met with the family including son and daughter on 5/21 and outlined patient's very poor prognosis, poor quality of life and progressive failure to thrive in spite of initiation of hemodialysis which hasn't helped. Family changed CODE STATUS to DO NOT RESUSCITATE and patient's son today indicated no further HD after last HD on 5/23. Palliative care consulted by nephrology on 5/24  Paroxysmal atrial fibrillation  -CHADS-VASc=6  -Anticoagulation previously discontinued  due to GI bleed with hemoglobin down to 6  -Presently in sinus  -Continue amiodarone   Hypothyroidism  -Continue Synthroid   Hyperlipidemia  -Continue statin   Hypertension - Controlled.  Anemia -  Stable  Hypoglycemia - Low CBGs on 5/21. Not on hypoglycemics.? Secondary to renal failure and poor oral intake. Continue to monitor CBGs-No further episodes.  Failure to thrive  Sacral decubitus ulcer - Wound care consultation note from 5/23 appreciated.  Family Communication:  discussed with son at bedside on 5/23.  Disposition Plan: To be determined.   Procedures/Studies: Dg Chest 2 View  04/27/2014   CLINICAL DATA:  Productive cough  EXAM: CHEST  2 VIEW  COMPARISON:  None.  FINDINGS: The heart size and mediastinal contours are stable. There is no focal infiltrate or pulmonary edema. There are small bilateral pleural effusions. The lungs are hyperinflated. Dual-lumen right central venous line is unchanged. The visualized skeletal structures are stable.  IMPRESSION: No focal pneumonia.  Small bilateral pleural effusions.  Emphysema.   Electronically Signed   By: Abelardo Diesel M.D.   On: 04/27/2014 16:19        Subjective: Patient denies complaints.   Objective: Filed Vitals:   05/03/14 1800 05/03/14 1807 05/03/14 2151 05/04/14 0638  BP: 102/55 107/67 128/50 124/41  Pulse: 96 99 90 78  Temp:  97.9 F (36.6 C) 98.1 F (36.7 C) 98.7 F (37.1 C)  TempSrc:  Oral Oral Oral  Resp:  '18 17 16  ' Height:      Weight:  39.9 kg (87 lb 15.4 oz) 38.919 kg (85 lb 12.8 oz)   SpO2:  95% 95% 95%    Intake/Output Summary (Last 24 hours) at 05/04/14 1135 Last data filed at 05/04/14 0600  Gross per 24 hour  Intake    240 ml  Output   2132 ml  Net  -1892 ml   Weight change: 0.4 kg (14.1 oz) Exam:   General:  Alert and oriented x2. no acute distress. Pleasant and comfortable elderly frail female lying comfortably supine in bed.  Cardiovascular: RRR, S1/S2  Respiratory: CTA bilaterally  Abdomen: Soft, nontender, nondistended, hypoactive bowel sounds  Extremities: No clubbing or cyanosis, 2+ pitting edema from knees down bilaterally  Skin: Unstable sacral pressure ulcer exam as  per wound care nurse on 5/23.  Data Reviewed: Basic Metabolic Panel:  Recent Labs Lab 04/27/14 1703 04/28/14 0640 04/29/14 0542 05/01/14 1257 05/03/14 1426  NA 134* 133* 131* 136* 134*  K 4.3 4.3 4.3 3.4* 3.4*  CL 97 99 98 99 98  CO2 '27 23 24 25 27  ' GLUCOSE 81 82 85 43* 108*  BUN 30* 37* 50* 39* 34*  CREATININE 2.30* 2.68* 3.40* 3.11* 3.27*  CALCIUM 7.7* 7.4* 7.3* 7.8* 7.8*  PHOS  --   --   --  2.8 2.3   Liver Function Tests:  Recent Labs Lab 05/01/14 1257 05/03/14 1426  ALBUMIN 1.3* 1.3*   No results found for this basename: LIPASE, AMYLASE,  in the last 168 hours No results found for this basename: AMMONIA,  in the last 168 hours CBC:  Recent Labs Lab 04/30/14 0805 05/01/14 0500 05/02/14 1300 05/03/14 0720 05/04/14 0935  WBC 6.3 5.8 6.4 7.3 8.7  HGB 9.7* 10.7* 10.6* 10.3* 9.0*  HCT 30.4* 32.1* 33.2* 32.7* 26.4*  MCV 96.2 93.6 95.4 96.5 92.0  PLT 155 200 149* 135* 144*   Cardiac Enzymes: No results found for this basename: CKTOTAL, CKMB, CKMBINDEX, TROPONINI,  in the  last 168 hours BNP: No components found with this basename: POCBNP,  CBG:  Recent Labs Lab 05/02/14 1734 05/02/14 2255 05/03/14 0744 05/03/14 1146 05/03/14 2145  GLUCAP 75 115* 94 78 101*    Recent Results (from the past 240 hour(s))  MRSA PCR SCREENING     Status: None   Collection Time    04/27/14  9:57 PM      Result Value Ref Range Status   MRSA by PCR NEGATIVE  NEGATIVE Final   Comment:            The GeneXpert MRSA Assay (FDA     approved for NASAL specimens     only), is one component of a     comprehensive MRSA colonization     surveillance program. It is not     intended to diagnose MRSA     infection nor to guide or     monitor treatment for     MRSA infections.     Scheduled Meds: . amiodarone  100 mg Oral Daily  . antiseptic oral rinse  15 mL Mouth Rinse q12n4p  . atorvastatin  40 mg Oral QHS  . calcitRIOL  0.5 mcg Oral Q T,Th,Sat-1800  . calcium acetate   667 mg Oral TID WC  . carbamide peroxide  5 drop Both Ears BID  . chlorhexidine  15 mL Mouth Rinse BID  . collagenase   Topical Daily  . [START ON 05/06/2014] darbepoetin (ARANESP) injection - DIALYSIS  40 mcg Intravenous Q Tue-HD  . dextrose  25 mL Intravenous Once  . feeding supplement (PRO-STAT SUGAR FREE 64)  30 mL Oral BID  . heparin subcutaneous  5,000 Units Subcutaneous 3 times per day  . levothyroxine  50 mcg Oral QAC breakfast  . mirtazapine  15 mg Oral QHS  . multivitamin  1 tablet Oral QHS  . pantoprazole  20 mg Oral Daily  . sodium chloride  3 mL Intravenous Q12H   Continuous Infusions:    Modena Jansky, MD, FACP, [facility]. [facility] Pager [phone]  If 7PM-7AM, please contact night-coverage [email or web address] Password [facility] 05/04/2014, 11:35 AM   LOS: 7 days

## 2014-05-04 NOTE — Progress Notes (Signed)
Subjective:   Pt asleep  Objective Filed Vitals:   05/03/14 1800 05/03/14 1807 05/03/14 2151 05/04/14 0638  BP: 102/55 107/67 128/50 124/41  Pulse: 96 99 90 78  Temp:  97.9 F (36.6 C) 98.1 F (36.7 C) 98.7 F (37.1 C)  TempSrc:  Oral Oral Oral  Resp:  18 17 16   Height:      Weight:  39.9 kg (87 lb 15.4 oz) 38.919 kg (85 lb 12.8 oz)   SpO2:  95% 95% 95%   Physical Exam General: no distress, thin, emaciated Heart: RRR Lungs: CTA, dim, unlabored Abdomen: soft, nontender +BS Extremities: +2 LE edema, RUA edema.  Dialysis Access: R IJ perm cath and RUA AVF +bruit (placed 3/16)  Dialysis Orders: TTS @ [facility]  4 hr 43kgs 4K/2.25Ca 180 400/800 R IJ  No heparin 0.5 calcitriol No ESA or Fe (tsat 59 5/14)   Assessment:  1. DVT- R Lower ext. Subacute and L Lower ext chronic. IVC filter placed 5/21. No AC due to hx GIB 2. ESRD - on HD 3. Hypertension/volume - 128/50, wt 40.6 below EDW Has bipedal edema with DVTS. Lower dry wt at DC. 4. Anemia - hgb 10.7, improving. start Aranesp 40 Q Tuesday. No Fe,moniter CBC 5. Metabolic bone disease - Corrected Ca+10. Phos 2.8. Cont calcitriol and phoslo 6. Nutrition - alb 1.3 .liberalizing Diet with poor po intake/ Nepro, multivit 7. Afib- amiodarone/ no coumadin sec. In past Gi bleed on apixiban 8. HO GI Bleed- stable hgb currently 9. Stage IV sacral decub- this is new, WC consulted  Plan- Family meeting was held on Friday with son and daughter and comfort care for [name] was recommended due to severe comorbidities and progressive FTT.  A long discussion was held and questions were answered. Yesterday the son requested that Saturday's dialysis would be the last.  I am consulting palliative care to assist with transition to hospice care. No further HD.  Have d/w primary MD.   Kelly Splinter MD pager [phone]    cell [phone] 05/04/2014, 8:11 AM  Additional Objective Labs: Basic Metabolic Panel:  Recent Labs Lab 04/29/14 0542 05/01/14 1257  05/03/14 1426  NA 131* 136* 134*  K 4.3 3.4* 3.4*  CL 98 99 98  CO2 24 25 27   GLUCOSE 85 43* 108*  BUN 50* 39* 34*  CREATININE 3.40* 3.11* 3.27*  CALCIUM 7.3* 7.8* 7.8*  PHOS  --  2.8 2.3   Liver Function Tests:  Recent Labs Lab 05/01/14 1257 05/03/14 1426  ALBUMIN 1.3* 1.3*   No results found for this basename: LIPASE, AMYLASE,  in the last 168 hours CBC:  Recent Labs Lab 04/29/14 0542 04/30/14 0805 05/01/14 0500 05/02/14 1300 05/03/14 0720  WBC 8.9 6.3 5.8 6.4 7.3  HGB 9.5* 9.7* 10.7* 10.6* 10.3*  HCT 29.6* 30.4* 32.1* 33.2* 32.7*  MCV 94.3 96.2 93.6 95.4 96.5  PLT 181 155 200 149* 135*   Blood Culture    Component Value Date/Time   SDES URINE, CATHETERIZED 02/18/2014 1259   SPECREQUEST NONE 02/18/2014 1259   CULT  Value: ESCHERICHIA COLI Performed at [facility] 02/18/2014 1259   REPTSTATUS 02/20/2014 FINAL 02/18/2014 1259    Cardiac Enzymes: No results found for this basename: CKTOTAL, CKMB, CKMBINDEX, TROPONINI,  in the last 168 hours CBG:  Recent Labs Lab 05/02/14 1734 05/02/14 2255 05/03/14 0744 05/03/14 1146 05/03/14 2145  GLUCAP 75 115* 94 78 101*   Iron Studies: No results found for this basename: IRON, TIBC, TRANSFERRIN,  FERRITIN,  in the last 72 hours @lablastinr3 @ Studies/Results: No results found. Medications:   . amiodarone  100 mg Oral Daily  . antiseptic oral rinse  15 mL Mouth Rinse q12n4p  . atorvastatin  40 mg Oral QHS  . calcitRIOL  0.5 mcg Oral Q T,Th,Sat-1800  . calcium acetate  667 mg Oral TID WC  . carbamide peroxide  5 drop Both Ears BID  . chlorhexidine  15 mL Mouth Rinse BID  . collagenase   Topical Daily  . [START ON 05/06/2014] darbepoetin (ARANESP) injection - DIALYSIS  40 mcg Intravenous Q Tue-HD  . dextrose  25 mL Intravenous Once  . feeding supplement (PRO-STAT SUGAR FREE 64)  30 mL Oral BID  . heparin subcutaneous  5,000 Units Subcutaneous 3 times per day  . levothyroxine  50 mcg Oral QAC breakfast  .  mirtazapine  15 mg Oral QHS  . multivitamin  1 tablet Oral QHS  . pantoprazole  20 mg Oral Daily  . sodium chloride  3 mL Intravenous Q12H

## 2014-05-05 LAB — GLUCOSE, CAPILLARY
GLUCOSE-CAPILLARY: 69 mg/dL — AB (ref 70–99)
Glucose-Capillary: 86 mg/dL (ref 70–99)
Glucose-Capillary: 81 mg/dL (ref 70–99)
Glucose-Capillary: 90 mg/dL (ref 70–99)

## 2014-05-05 MED ORDER — DEXTROSE 50 % IV SOLN
INTRAVENOUS | Status: AC
  Filled 2014-05-05: qty 50

## 2014-05-05 MED ORDER — WHITE PETROLATUM GEL
Status: AC
  Administered 2014-05-05: 0.2
  Filled 2014-05-05: qty 5

## 2014-05-05 NOTE — Progress Notes (Signed)
Patient ID: [name], female   DOB: 09-24-1926, 80 y.o.   MRN: [id number]  [facility] Progress Note    Subjective:   Lethargic and nonverbal   Objective:   BP 133/54  Pulse 91  Temp(Src) 99.1 F (37.3 C) (Oral)  Resp 16  Ht 5' 1.81" (1.57 m)  Wt 38.919 kg (85 lb 12.8 oz)  BMI 15.79 kg/m2  SpO2 95%  Intake/Output: I/O last 3 completed shifts: In: 600 [P.O.:600] Out: 0    Intake/Output this shift:    Weight change:   Physical Exam: MBW:GYKZL, cachectic WF CVS:no rub Resp:scattered rhonchi DJT:TSVXBL Ext:no edema  Labs: BMET  Recent Labs Lab 04/29/14 0542 05/01/14 1257 05/03/14 1426  NA 131* 136* 134*  K 4.3 3.4* 3.4*  CL 98 99 98  CO2 24 25 27   GLUCOSE 85 43* 108*  BUN 50* 39* 34*  CREATININE 3.40* 3.11* 3.27*  ALBUMIN  --  1.3* 1.3*  CALCIUM 7.3* 7.8* 7.8*  PHOS  --  2.8 2.3   CBC  Recent Labs Lab 05/01/14 0500 05/02/14 1300 05/03/14 0720 05/04/14 0935  WBC 5.8 6.4 7.3 8.7  HGB 10.7* 10.6* 10.3* 9.0*  HCT 32.1* 33.2* 32.7* 26.4*  MCV 93.6 95.4 96.5 92.0  PLT 200 149* 135* 144*    @IMGRELPRIORS @ Medications:    . amiodarone  100 mg Oral Daily  . antiseptic oral rinse  15 mL Mouth Rinse q12n4p  . atorvastatin  40 mg Oral QHS  . calcitRIOL  0.5 mcg Oral Q T,Th,Sat-1800  . calcium acetate  667 mg Oral TID WC  . carbamide peroxide  5 drop Both Ears BID  . chlorhexidine  15 mL Mouth Rinse BID  . collagenase   Topical Daily  . [START ON 05/06/2014] darbepoetin (ARANESP) injection - DIALYSIS  40 mcg Intravenous Q Tue-HD  . dextrose  25 mL Intravenous Once  . feeding supplement (PRO-STAT SUGAR FREE 64)  30 mL Oral BID  . heparin subcutaneous  5,000 Units Subcutaneous 3 times per day  . levothyroxine  50 mcg Oral QAC breakfast  . mirtazapine  15 mg Oral QHS  . multivitamin  1 tablet Oral QHS  . pantoprazole  20 mg Oral Daily  . sodium chloride  3 mL Intravenous Q12H   Dialysis Orders: TTS @ [facility]  4 hr 43kgs 4K/2.25Ca 180  400/800 R IJ  No heparin 0.5 calcitriol No ESA or Fe (tsat 59 5/14)   Assessment/ Plan:   Assessment:  1. FTT- pt with steady decline in overall condition.  Poor nutrition, poor functional status, poor mentation.  Agree with plans for CMO as she is not appropriate for outpt dialysis. 2. DVT- R Lower ext. Subacute and L Lower ext chronic. IVC filter placed 5/21. No AC due to hx GIB 3. ESRD - with FTT and decline in mental and functional status 4. Hypertension/volume - stable 5. Nutrition - alb 1.3 .liberalizing Diet with poor po intake/ Nepro, multivit 6. Afib- amiodarone/ no coumadin sec. In past Gi bleed on apixiban 7. HO GI Bleed- stable hgb currently 8. Stage IV sacral d ecub- this is new, WC consulted 9. Dispo- as above agree with transition to hospice and [place] 05/05/2014, 3:21 PM

## 2014-05-05 NOTE — Progress Notes (Signed)
Speech Language Pathology Treatment: Dysphagia  Patient Details Name: NALIA HONEYCUTT MRN: [id number] DOB: 01/05/1926 Today's Date: 05/05/2014 Time: 6503-5465 SLP Time Calculation (min): 9 min  Assessment / Plan / Recommendation Clinical Impression  Pt consumed minimal amount of food from lunch tray, including Dys 3 textures and thin liquids via cup sips. Pt with no overt s/s of aspiration with thin liquids, although she did demonstrate multiple swallows and a suspected delayed swallow initiation. Pt fatigued quickly and requested to rest. Given limited observation and increased fatigue, recommend to continue with current textures at this time. Note that the son has chosen to shift the focus of treatment toward a comfort approach with further discussion with Palliative team scheduled. Will continue to follow pending discussion regarding [place].   HPI HPI: JHERI MITTER is a 80 y.o. female who presents with son to ED with BLE swelling for past 3 weeks.  Diagnosed with blood clot in R leg about 3 weeks ago, started on elequis. Despite elequis her swelling seemed worse to son when he woke her up 04/27/14. Patient has no CP no SOB. Pt has a h/o dysphagia requiring altered solids/liquids, with most recent MBS 10/16/13 recommending regular textures and thin liquids with a chin tuck. Pt reports that she has no longer been utilizing a chin tuck, and there is no evidence of PNA on CXR.   Pertinent Vitals N/A  SLP Plan  Continue with current plan of care    Recommendations Diet recommendations: Regular;Nectar-thick liquid Liquids provided via: Cup;No straw Medication Administration: Whole meds with puree Supervision: Patient able to self feed;Full supervision/cueing for compensatory strategies Compensations: Slow rate;Small sips/bites Postural Changes and/or Swallow Maneuvers: Seated upright 90 degrees       Oral Care Recommendations: Oral care BID Follow up Recommendations: Home health SLP Plan: Continue with  current plan of care    GO      Germain Osgood, M.A. CCC-SLP [phone]  Germain Osgood 05/05/2014, 2:37 PM

## 2014-05-05 NOTE — Progress Notes (Signed)
PROGRESS NOTE  TERRIAH REGGIO EXB:[id number] DOB: [birth year] DOA: 04/27/2014 PCP: Robyn Haber, MD  Interim summary  80 year old female with a history of dementia, atrial fibrillation, ESRD, stroke presented 5/17 with worsening bilateral lower extremity edema.Pt admitted 3/10-3/24 due to worsening anemia (Hgb 6.2) in the setting of UGI bleed and progressive renal failure (Cr 7). She was transfused with PRBCs. She was guaiac positive and underwent EGD with GI that was unrevealing. Anemia was felt to be multifactorial from slow GI losses in setting of coagulopathy (INR was 5.9 on admit), renal disease, malabsorption from prior GI surgery. GI recommended that the patient remain off of coumadin for at least 2 weeks and asked that risks vs benefits be weighed prior to resuming. She had AVF placed during her stay and hemodialysis was started. She was seen by Palliative Care and Full Code Status was continued. She was d/c to [facility].  Son took pt home from [place].     The patient was diagnosed with a right lower extremity DVT on 03/24/14. According to the son, there was worsening right lower extremity edema on the day of admission. Venous duplex  of the lower extremity at [facility] showed acute to subacute DVT of the right common femoral vein with subacute DVT of the right femoral and popliteal vein as well as chronic DVT in the left lower extremity. The patient was started on apixiban approximately on 04/10/2014.   After extensive review, it is felt that actually patient's DVT was not acute and this was more subacute from previous. After some discussion with patient's son, he decided on 5/20 to avoid anticoagulation and went with IVC filter.  Interventional radiology placed filter on 5/21.  Palliative care discussion was done by Dr.Schertz, Nephrology and reiterated by this MD. Son was receptive- changed to DNR and stated no further HD after last one 5/23. Palliative care consulted on 5/24 for  hospice transition- input pending.  Assessment/Plan:  DVT bilateral lower extremities  -Venous duplex shows acute to subacute DVT right common femoral vein, subacute DVT a right femoral and popliteal vein; chronic DVT left lower extremity  -According to medical record, patient was started on apixiban approximately 04/10/2014  -[facility] duplex report essentially unchanged from venous duplex performed on 03/24/2014  -As the patient has history of recent GI bleed and ESRD, hesitate to restart apixiban  - Dr. Carles Collet had long discussion with the patient's son regarding risks benefits of long-term anticoagulation--son has decided on IVC filter, which was placed by interventional radiology on 5/21 -No present evidence of phlegmasia alba on physical examination--extremities are warm with popliteal pulses present bilateral  -Heparin drip discontinued after filter placed  ESRD - no further HD -Consulted nephrology for maintenance dialysis  -Dialyzed Tuesday, Thursday, Saturday  - Dr. Jonnie Finner met with the family including son and daughter on 5/21 and outlined patient's very poor prognosis, poor quality of life and progressive failure to thrive in spite of initiation of hemodialysis which hasn't helped. Family changed CODE STATUS to DO NOT RESUSCITATE and patient's son subsequently indicated no further HD after last HD was on 5/23. Palliative care consulted by nephrology on 5/24- input pending  Paroxysmal atrial fibrillation  -CHADS-VASc=6  -Anticoagulation previously discontinued due to GI bleed with hemoglobin down to 6  -Presently in sinus  -Continue amiodarone   Hypothyroidism  -Continue Synthroid   Hyperlipidemia  -Continue statin   Hypertension - Controlled.  Anemia - Stable  Hypoglycemia - has  periodic hypoglycemic episodes, likely due to renal failure and poor PO intake. Not on hypoglycemics.  Failure to thrive  Sacral decubitus ulcer - Wound care consultation note from 5/23  appreciated.  Family Communication:  discussed with son at bedside on 5/23.  Disposition Plan: To be determined. ? SNF with hospice.   Procedures/Studies: Dg Chest 2 View  04/27/2014   CLINICAL DATA:  Productive cough  EXAM: CHEST  2 VIEW  COMPARISON:  None.  FINDINGS: The heart size and mediastinal contours are stable. There is no focal infiltrate or pulmonary edema. There are small bilateral pleural effusions. The lungs are hyperinflated. Dual-lumen right central venous line is unchanged. The visualized skeletal structures are stable.  IMPRESSION: No focal pneumonia.  Small bilateral pleural effusions.  Emphysema.   Electronically Signed   By: Abelardo Diesel M.D.   On: 04/27/2014 16:19        Subjective: Patient denies complaints. Denies pain.  Objective: Filed Vitals:   05/04/14 1851 05/04/14 2159 05/05/14 0530 05/05/14 0847  BP: 125/51 142/42 139/53 133/54  Pulse: 84 79 88 91  Temp: 98 F (36.7 C) 98.1 F (36.7 C) 98.6 F (37 C) 99.1 F (37.3 C)  TempSrc: Oral Oral Oral Oral  Resp: _0 Height:      Weight:      SpO2:  99% 96% 95%    Intake/Output Summary (Last 24 hours) at 05/05/14 1401 Last data filed at 05/04/14 2300  Gross per 24 hour  Intake    120 ml  Output      0 ml  Net    120 ml   Weight change:  Exam:   General:  Alert and oriented x2. no acute distress. Pleasant and comfortable elderly frail female lying comfortably supine in bed.  Cardiovascular: RRR, S1/S2  Respiratory: CTA bilaterally  Abdomen: Soft, nontender, nondistended, hypoactive bowel sounds  Extremities: Moving all extremities appropriately.  Skin: Unstable sacral pressure ulcer exam as per wound care nurse on 5/23.  Data Reviewed: Basic Metabolic Panel:  Recent Labs Lab 04/29/14 0542 05/01/14 1257 05/03/14 1426  NA 131* 136* 134*  K 4.3 3.4* 3.4*  CL 98 99 98  CO2 _1 GLUCOSE 85 43* 108*  BUN 50* 39* 34*  CREATININE 3.40* 3.11* 3.27*  CALCIUM 7.3* 7.8*  7.8*  PHOS  --  2.8 2.3   Liver Function Tests:  Recent Labs Lab 05/01/14 1257 05/03/14 1426  ALBUMIN 1.3* 1.3*   No results found for this basename: LIPASE, AMYLASE,  in the last 168 hours No results found for this basename: AMMONIA,  in the last 168 hours CBC:  Recent Labs Lab 04/30/14 0805 05/01/14 0500 05/02/14 1300 05/03/14 0720 05/04/14 0935  WBC 6.3 5.8 6.4 7.3 8.7  HGB 9.7* 10.7* 10.6* 10.3* 9.0*  HCT 30.4* 32.1* 33.2* 32.7* 26.4*  MCV 96.2 93.6 95.4 96.5 92.0  PLT 155 200 149* 135* 144*   Cardiac Enzymes: No results found for this basename: CKTOTAL, CKMB, CKMBINDEX, TROPONINI,  in the last 168 hours BNP: No components found with this basename: POCBNP,  CBG:  Recent Labs Lab 05/04/14 1158 05/04/14 1711 05/04/14 2155 05/05/14 0728 05/05/14 1141  GLUCAP 87 67* 83 86 81    Recent Results (from the past 240 hour(s))  MRSA PCR SCREENING     Status: None   Collection Time    04/27/14  9:57 PM      Result Value Ref Range Status   MRSA by  PCR NEGATIVE  NEGATIVE Final   Comment:            The GeneXpert MRSA Assay (FDA     approved for NASAL specimens     only), is one component of a     comprehensive MRSA colonization     surveillance program. It is not     intended to diagnose MRSA     infection nor to guide or     monitor treatment for     MRSA infections.     Scheduled Meds: . amiodarone  100 mg Oral Daily  . antiseptic oral rinse  15 mL Mouth Rinse q12n4p  . atorvastatin  40 mg Oral QHS  . calcitRIOL  0.5 mcg Oral Q T,Th,Sat-1800  . calcium acetate  667 mg Oral TID WC  . carbamide peroxide  5 drop Both Ears BID  . chlorhexidine  15 mL Mouth Rinse BID  . collagenase   Topical Daily  . [START ON 05/06/2014] darbepoetin (ARANESP) injection - DIALYSIS  40 mcg Intravenous Q Tue-HD  . dextrose  25 mL Intravenous Once  . feeding supplement (PRO-STAT SUGAR FREE 64)  30 mL Oral BID  . heparin subcutaneous  5,000 Units Subcutaneous 3 times per day   . levothyroxine  50 mcg Oral QAC breakfast  . mirtazapine  15 mg Oral QHS  . multivitamin  1 tablet Oral QHS  . pantoprazole  20 mg Oral Daily  . sodium chloride  3 mL Intravenous Q12H   Continuous Infusions:    Modena Jansky, MD, FACP, [facility]. [facility] Pager [phone]  If 7PM-7AM, please contact night-coverage [email or web address] Password [email or web address] 05/05/2014, 2:01 PM   LOS: 8 days

## 2014-05-05 NOTE — Progress Notes (Signed)
PT Cancellation Note  Patient Details Name: [name] MRN: [id number] DOB: Jul 09, 1926   Cancelled Treatment:    Reason Eval/Treat Not Completed: Other (comment), noted HD stopped 05/03/14 and family to discuss palliative care this afternoon. Will await outcome of that discussion to determine whether PT still appropriate.    [place] 05/05/2014, 2:33 PM

## 2014-05-06 ENCOUNTER — Telehealth: Payer: Self-pay

## 2014-05-06 DIAGNOSIS — Z515 Encounter for palliative care: Secondary | ICD-10-CM

## 2014-05-06 DIAGNOSIS — R52 Pain, unspecified: Secondary | ICD-10-CM | POA: Diagnosis present

## 2014-05-06 LAB — GLUCOSE, CAPILLARY
Glucose-Capillary: 109 mg/dL — ABNORMAL HIGH (ref 70–99)
Glucose-Capillary: 81 mg/dL (ref 70–99)

## 2014-05-06 MED ORDER — MORPHINE SULFATE (CONCENTRATE) 10 MG /0.5 ML PO SOLN: 5.0000 mg | ORAL | Status: DC | PRN

## 2014-05-06 MED ORDER — BISACODYL 10 MG RE SUPP: 10.0000 mg | Freq: Every day | RECTAL | Status: DC | PRN

## 2014-05-06 MED ORDER — LORAZEPAM 1 MG PO TABS: 1.0000 mg | ORAL_TABLET | ORAL | Status: DC | PRN

## 2014-05-06 MED ORDER — MORPHINE SULFATE (CONCENTRATE) 10 MG /0.5 ML PO SOLN: 5.0000 mg | ORAL | Status: AC | PRN

## 2014-05-06 MED ORDER — COLLAGENASE 250 UNIT/GM EX OINT: TOPICAL_OINTMENT | Freq: Every day | CUTANEOUS | Status: AC

## 2014-05-06 NOTE — Discharge Summary (Signed)
Physician Discharge Summary  [name]:[id number] DOB: 1926-08-31 DOA: 04/27/2014  PCP: Leslie Haber, MD  Admit date: 04/27/2014 Discharge date: 05/06/2014  Recommendations for Outpatient Follow-up:  Discharge home with hospice per family request    Discharge Diagnoses:  Principal Problem:   DVT of leg (deep venous thrombosis) Active Problems:   ESRD on dialysis   Palliative care encounter   Pain, generalized    Discharge Condition: stable  Diet recommendation: per family wishes as tolerated  History of present illness:  80 year-old female with a history of dementia, atrial fibrillation, ESRD, stroke who presented to [facility] ED 04/27/14 with worsening bilateral lower extremity edema. Pt had recent hospitalization, 3/10-3/24 for worsening anemia (Hgb 6.2) in the setting of UGI bleed and progressive renal failure (Cr 7). She was transfused with PRBCs. She was guaiac positive and underwent EGD which was unrevealing. Anemia was felt to be multifactorial from slow GI losses in setting of coagulopathy (INR was 5.9 on admit), renal disease, malabsorption from prior GI surgery. GI recommended that the patient remain off of coumadin for at least 2 weeks and asked that risks vs benefits be weighed prior to resuming. She had AVF placed during her stay and hemodialysis was started. She was seen by Palliative Care and Full Code Status was continued. She was d/c to [facility]. Son took pt home from [place].  The patient was diagnosed with a right lower extremity DVT on 03/24/14. According to the son, there was worsening right lower extremity edema on the day of admission. Venous duplex of the lower extremity at [facility] showed acute to subacute DVT of the right common femoral vein with subacute DVT of the right femoral and popliteal vein as well as chronic DVT in the left lower extremity. The patient was started on apixiban approximately on 04/10/2014.  After extensive review, it is felt that actually  patient's DVT was not acute and this was more subacute from previous. After some discussion with patient's son, he decided on 5/20 to avoid anticoagulation and went with IVC filter. Interventional radiology placed filter on 5/21.  Palliative care again met with the family adn goals of care again discussed with the pt son who agreed to have hospice for his mom, code status was changed to DNR.  Assessment/Plan:   DVT bilateral lower extremities  - Venous duplex showed acute to subacute DVT right common femoral vein, subacute DVT a right femoral and popliteal vein; chronic DVT left lower extremity  - Patient was started on apixiban approximately 04/10/2014 which was later stopped due to high risk of bleed. IVC filter was placed by IR 05/01/2014  ESRD  - per family discussion with renal, decision was made to stop further HD to ensure comfort - pt will go home with hospice Paroxysmal atrial fibrillation  - AC stopped as pt is high risk for bleed - IVC filter placed - family did not want amiodarone; focus is on comfort and to minimize any meds possible Hypothyroidism  - all meds stopped to ensure comfort Severe protein calorie malnutrition, failure to thrive - secondary to multiple co-morbid conditions - PO intake very poor de to poor mental status - diet as pt tolerates    Family Communication: discussed with son at bedside on 5/23.  Disposition Plan: To be determined. ? SNF with hospice.    Procedures/Studies:  Dg Chest 2 View  04/27/2014 IMPRESSION: No focal pneumonia. Small bilateral pleural effusions. Emphysema.   Signed:  Robbie Lis, MD  [facility] 05/06/2014, 4:07 PM  Pager #: [phone]   Discharge Exam: Filed Vitals:   05/06/14 1005  BP: 131/45  Pulse: 87  Temp: 99.7 F (37.6 C)  Resp: 12   Filed Vitals:   05/05/14 1738 05/05/14 2053 05/06/14 0441 05/06/14 1005  BP: 1'27/69 68/26 86/24 ' 131/45  Pulse: 76 80 81 87  Temp: 98.6 F (37 C) 98 F (36.7 C)  98.2 F (36.8 C) 99.7 F (37.6 C)  TempSrc: Axillary Oral Oral Oral  Resp: '15 16 16 12  ' Height:      Weight:      SpO2: 94% 93% 96% 95%    General: Pt is sleeping, not in acute distress Cardiovascular: Regular rate and rhythm, S1/S2 appreciated Respiratory: bilateral air entry Abdominal: Soft, non tender, non distended Extremities: Pulses palpable bilaterally DP and PT Neuro: Grossly nonfocal  Discharge Instructions  Discharge Instructions   Call MD for:  difficulty breathing, headache or visual disturbances    Complete by:  As directed      Call MD for:  persistant dizziness or light-headedness    Complete by:  As directed      Call MD for:  persistant nausea and vomiting    Complete by:  As directed      Call MD for:  severe uncontrolled pain    Complete by:  As directed      Diet - low sodium heart healthy    Complete by:  As directed      Discharge instructions    Complete by:  As directed      Increase activity slowly    Complete by:  As directed             Medication List    STOP taking these medications       amiodarone 200 MG tablet  Commonly known as:  PACERONE     apixaban 2.5 MG Tabs tablet  Commonly known as:  ELIQUIS     atorvastatin 40 MG tablet  Commonly known as:  LIPITOR     calcium acetate 667 MG capsule  Commonly known as:  PHOSLO     carbamide peroxide 6.5 % otic solution  Commonly known as:  DEBROX     feeding supplement (NEPRO CARB STEADY) Liqd     feeding supplement (PRO-STAT SUGAR FREE 64) Liqd     levothyroxine 25 MCG tablet  Commonly known as:  SYNTHROID, LEVOTHROID     liver oil-zinc oxide 40 % ointment  Commonly known as:  DESITIN     mirtazapine 15 MG tablet  Commonly known as:  REMERON     multivitamin Tabs tablet     OVER THE COUNTER MEDICATION     pantoprazole 20 MG tablet  Commonly known as:  PROTONIX      TAKE these medications       acetaminophen 500 MG tablet  Commonly known as:  TYLENOL  Take 500  mg by mouth 2 (two) times daily as needed (pain).     collagenase ointment  Commonly known as:  SANTYL  Apply topically daily.     morphine CONCENTRATE 10 mg / 0.5 ml concentrated solution  Take 0.25 mLs (5 mg total) by mouth every hour as needed for moderate pain or severe pain (dyspnea).           Follow-up Information   Follow up with Leslie Haber, MD. (As needed, If symptoms worsen)    Specialty:  Family Medicine   Contact information:  [address] [place] [address] [phone]        The results of significant diagnostics from this hospitalization (including imaging, microbiology, ancillary and laboratory) are listed below for reference.    Significant Diagnostic Studies: Dg Chest 2 View 04/27/2014    IMPRESSION: No focal pneumonia.  Small bilateral pleural effusions.  Emphysema.   Electronically Signed   By: Abelardo Diesel M.D.   On: 04/27/2014 16:19   Ir Ivc Filter Plmt / S&i /img Guid/mod Sed 05/02/2014   IMPRESSION: Successful infrarenal IVC filter placement. This is a temporary filter. It can be removed or remain in place to become permanent.   Electronically Signed   By: Maryclare Bean M.D.   On: 05/02/2014 08:40    Microbiology: MRSA PCR SCREENING     Status: None   Collection Time    04/27/14  9:57 PM      Result Value Ref Range Status   MRSA by PCR NEGATIVE  NEGATIVE Final     Labs: Basic Metabolic Panel:  Recent Labs Lab 05/01/14 1257 05/03/14 1426  NA 136* 134*  K 3.4* 3.4*  CL 99 98  CO2 25 27  GLUCOSE 43* 108*  BUN 39* 34*  CREATININE 3.11* 3.27*  CALCIUM 7.8* 7.8*  PHOS 2.8 2.3   Liver Function Tests:  Recent Labs Lab 05/01/14 1257 05/03/14 1426  ALBUMIN 1.3* 1.3*   No results found for this basename: LIPASE, AMYLASE,  in the last 168 hours No results found for this basename: AMMONIA,  in the last 168 hours CBC:  Recent Labs Lab 04/30/14 0805 05/01/14 0500 05/02/14 1300 05/03/14 0720 05/04/14 0935  WBC 6.3 5.8 6.4  7.3 8.7  HGB 9.7* 10.7* 10.6* 10.3* 9.0*  HCT 30.4* 32.1* 33.2* 32.7* 26.4*  MCV 96.2 93.6 95.4 96.5 92.0  PLT 155 200 149* 135* 144*   Cardiac Enzymes: No results found for this basename: CKTOTAL, CKMB, CKMBINDEX, TROPONINI,  in the last 168 hours BNP: BNP (last 3 results)  Recent Labs  04/27/14 1703  PROBNP 3979.0*   CBG:  Recent Labs Lab 05/05/14 1141 05/05/14 1736 05/05/14 2104 05/05/14 2344 05/06/14 0745  GLUCAP 81 90 69* 109* 81    Time coordinating discharge: Over 30 minutes

## 2014-05-06 NOTE — Consult Note (Signed)
Patient [name]      DOB: [birth year]      MIW:[id number]     Consult Note from the Palliative Medicine Team at [place] Requested by: SDr Algis Liming     PCP: Leslie Haber, MD Reason for Consultation:Cl;arification of [place] and options     Phone [phone]  Assessment of patients Current state:  Overall failure to thrive.  Family faced with advanced directive decisions and anticipatory care needs   Consult is for review of medical treatment options, clarification of goals of care and end of life issues, disposition and options, and symptom recommendation.  This NP Wadie Lessen reviewed medical records, received report from team, assessed the patient and then meet at the patient's bedside along with her son and duaghter  to discuss diagnosis prognosis, GOC, EOL wishes disposition and options.  A detailed discussion was had today regarding advanced directives.  Concepts specific to code status, artifical feeding and hydration, continued IV antibiotics and rehospitalization was had.  The difference between a aggressive medical intervention path  and a palliative comfort care path for this patient at this time was had.  Values and goals of care important to patient and family were attempted to be elicited.  Concept of Hospice and Palliative Care were discussed  Natural trajectory and expectations at EOL were discussed.  Questions and concerns addressed.  Hard Choices booklet left for review. Family encouraged to call with questions or concerns.  PMT will continue to support holistically.   Goals of Care: 1.  Code Status:DNR/DNI-comfort is main focus of care   2. Scope of Treatment: 1. Vital Signs: daily  2. Dialysis-no further treatments 3. Respiratory/Oxygen:for comfort only 4. Nutritional Support/Tube Feeds:no artificial feeding now or in the future 5. Antibiotics:oral only 6. GNO:IBBC once discahrged 7. Review of Medications to be discontinued:minimize  for comfort   3. Disposition:  Home with hospice services, will write for choice.  Will be going to her daughter's home [name])   4. Symptom Management:   1. Anxiety/Agitation: Ativan 1 mg po every 6 hrs prn 2. Pain/Dyspnea: Roxanol 5 mg po every 1 hr prn 3. Bowel Regimen:Dulcolax supp pr prn  5. Psychosocial:  Emotional support to family at bedside.  All understand the limited prognosis    Brief HPI:  [name] is an 80 y.o. female with PMHX of ESRD on HD, PAF, CVA, RLE DVT, HTN, GI bleed who presented complaining of increased lower extremity swelling. Venous duplex LE study revealed an acute to subacute DVT noted in the right common femoral vein and subacute DVT noted in the right femoral and popliteal veins. There is chronic DVT noted throughout the left lower extremity. She was previously on anticoagulation, however had a GI bleed in march of this year and required blood transfusion. In march, coumadin was discontinued and eliquis was to be started. Per chart review and the patient's PCP it is unclear if she was actually taking eliquis as instructed. She does have a significant fall history and given recent GI bleed IR received request for an image guided IVC filter. History is obtained per chart review and from her son over the phone today as the patient has dementia.  Chart review:  Jul 2009-- R hip fracture after fall, had ORIF, post op anemia, d/c'd to SNF. Other dx's HTN, mild dementia, hx L4 comp fx w vertebroplasty, osteoporosis, hx gastric cancer\  Apr 2013-- mechanical fall w L hip fracture, had L  THR on 4/18. On Namenda and Aricept for dementia. CKD Cr 1.76. Family met with palliative care re: [place] given pt's dementia. At the time the son wanted pt to make all her own decisions. D/C'd to SNF.  Sept 2014-- slurred speech with acute left MCA TIA vs CVA on MRI. Sig dysphagia on swallow study. RLE and RUE paresis from CVA. Afib w RVR. Tube feeds. Improved  and dc'd to CIR on coumadin  Oct 2014-- Rehab stay w improved R arm and leg weakness. D/C'd home.  Mar 2015-- melena, anemia, Hb 6.2 , EGD no source of bleed, transfused and coumadin held x 2 wks per GI rec and further AC to be decided by cardiology. Had R AVF and tunneled HD cath placed and was started on dialysis, dc'd on TTS OP schedule.   Family now on comfort care path, no further medical interventions to prolong life     ROS: unable to illicit due to decreased cognition   PMH:  Past Medical History  Diagnosis Date  . Dementia   . Hypertension   . Ulcer     Hx of  . Stomach cancer   . Snoring   . Fracture, intertrochanteric, left femur 03/25/2012  . Fracture, intertrochanteric, right femur 06/2008    s/p closed reduction and internal fixation  . Nodule of left lung     followed by Dr. Elsworth Soho // LLL spiculated nodule noted per CT in 2012 1.5x1.6x2.4, PET in 06/03/2011 was low uptake.  . Paroxysmal atrial fibrillation   . Tachycardia-bradycardia      PSH: Past Surgical History  Procedure Laterality Date  . Stomach surgery      3/4 of stomach removed due to stomach cancer  . Hip surgery  2009    right  . Total hip arthroplasty  03/29/2012    Procedure: TOTAL HIP ARTHROPLASTY;  Surgeon: Johnny Bridge, MD;  Location: [place];  Service: Orthopedics;  Laterality: Left;  Left Hip Hemiarthroplasty  . Esophagogastroduodenoscopy N/A 02/20/2014    Procedure: ESOPHAGOGASTRODUODENOSCOPY (EGD);  Surgeon: Arta Silence, MD;  Location: [facility] ENDOSCOPY;  Service: Endoscopy;  Laterality: N/A;  . Insertion of dialysis catheter Right 02/24/2014    Procedure: INSERTION OF DIALYSIS CATHETER;  Surgeon: Conrad Oilton, MD;  Location: [place];  Service: Vascular;  Laterality: Right;  . Av fistula placement Right 02/24/2014    Procedure: ARTERIOVENOUS (AV) FISTULA CREATION;  Surgeon: Conrad Arenac, MD;  Location: [facility] OR;  Service: Vascular;  Laterality: Right;   I have reviewed the [place] and SH and  If  appropriate update it with new information. No Known Allergies Scheduled Meds: . collagenase   Topical Daily  . sodium chloride  3 mL Intravenous Q12H   Continuous Infusions:  PRN Meds:.LORazepam, morphine CONCENTRATE    BP 86/24  Pulse 81  Temp(Src) 98.2 F (36.8 C) (Oral)  Resp 16  Ht 5' 1.81" (1.57 m)  Wt 38.919 kg (85 lb 12.8 oz)  BMI 15.79 kg/m2  SpO2 96%   PPS: 30 % at best   Intake/Output Summary (Last 24 hours) at 05/06/14 0830 Last data filed at 05/05/14 1841  Gross per 24 hour  Intake      0 ml  Output      0 ml  Net      0 ml    Physical Exam:  General: chronically ill appearing, NAD HEENT:  Moist membranes, no exudate Chest:   Decreased in bases  CTA CVS: RRR Abdomen: soft NT +BS  Ext:  BLE +1 edema Neuro: orietned to person only  Labs: CBC    Component Value Date/Time   WBC 8.7 05/04/2014 0935   WBC 5.7 04/23/2014 1235   WBC 6.7 09/18/2007 1403   RBC 2.87* 05/04/2014 0935   RBC 4.06 04/23/2014 1235   RBC 2.04* 02/18/2014 1337   RBC 4.12 09/18/2007 1403   HGB 9.0* 05/04/2014 0935   HGB 12.7 09/18/2007 1403   HCT 26.4* 05/04/2014 0935   HCT 37.1 09/18/2007 1403   PLT 144* 05/04/2014 0935   PLT 310 09/18/2007 1403   MCV 92.0 05/04/2014 0935   MCV 90.2 09/18/2007 1403   MCH 31.4 05/04/2014 0935   MCH 30.0 04/23/2014 1235   MCH 30.8 09/18/2007 1403   MCHC 34.1 05/04/2014 0935   MCHC 31.1* 04/23/2014 1235   MCHC 34.1 09/18/2007 1403   RDW 15.5 05/04/2014 0935   RDW 16.8* 04/23/2014 1235   RDW 12.3 09/18/2007 1403   LYMPHSABS 1.0 04/23/2014 1235   LYMPHSABS 0.6* 02/18/2014 1205   LYMPHSABS 0.8* 09/18/2007 1403   MONOABS 0.5 02/18/2014 1205   MONOABS 0.5 09/18/2007 1403   EOSABS 0.0 04/23/2014 1235   EOSABS 0.0 02/18/2014 1205   BASOSABS 0.0 04/23/2014 1235   BASOSABS 0.0 02/18/2014 1205   BASOSABS 0.1 09/18/2007 1403    BMET    Component Value Date/Time   NA 134* 05/03/2014 1426   NA 140 04/23/2014 1235   K 3.4* 05/03/2014 1426   CL 98 05/03/2014 1426   CO2 27  05/03/2014 1426   GLUCOSE 108* 05/03/2014 1426   GLUCOSE 67 04/23/2014 1235   BUN 34* 05/03/2014 1426   BUN 25 04/23/2014 1235   CREATININE 3.27* 05/03/2014 1426   CALCIUM 7.8* 05/03/2014 1426   GFRNONAA 12* 05/03/2014 1426   GFRAA 14* 05/03/2014 1426    CMP     Component Value Date/Time   NA 134* 05/03/2014 1426   NA 140 04/23/2014 1235   K 3.4* 05/03/2014 1426   CL 98 05/03/2014 1426   CO2 27 05/03/2014 1426   GLUCOSE 108* 05/03/2014 1426   GLUCOSE 67 04/23/2014 1235   BUN 34* 05/03/2014 1426   BUN 25 04/23/2014 1235   CREATININE 3.27* 05/03/2014 1426   CALCIUM 7.8* 05/03/2014 1426   PROT 4.8* 04/23/2014 1235   PROT 5.2* 02/19/2014 0521   ALBUMIN 1.3* 05/03/2014 1426   AST 36 04/23/2014 1235   ALT 23 04/23/2014 1235   ALKPHOS 132* 04/23/2014 1235   BILITOT 0.4 04/23/2014 1235   GFRNONAA 12* 05/03/2014 1426   GFRAA 14* 05/03/2014 1426    Time In Time Out Total Time Spent with Patient Total Overall Time  0745 0900 70 min 75 min    Greater than 50%  of this time was spent counseling and coordinating care related to the above assessment and plan.   Wadie Lessen NP  Palliative Medicine Team Team Phone # [phone] Pager [phone]  Discussed with Dr Charlies Silvers

## 2014-05-06 NOTE — Clinical Social Work Note (Signed)
Patient discharging to her daughter's home - [address], [place] today. CSW arranged ambulance transportation for patient. Daughter aware of ambulance transport.   Myrtie Leuthold Givens, MSW, LCSW [phone]

## 2014-05-06 NOTE — Care Management Note (Addendum)
CARE MANAGEMENT NOTE 05/06/2014  Patient:  [name], [name]   Account Number:  [id number]  Date Initiated:  04/28/2014  Documentation initiated by:  Lizabeth Leyden  Subjective/Objective Assessment:   admitted with lower extremity DVT  medical hx: DVT last month, ESRD/HD  05/01/14 Request my Dr Jonnie Finner for family meeting.     Action/Plan:   progression of care and discharge planning  05/01/2014 Spoke with son re family meeting ,scheduled for Friday, May 22 @ 2:30pm per son's wishes.   Anticipated DC Date:  05/06/2014   Anticipated DC Plan:  [place]  CM consult      Choice offered to / List presented to:  C-4 Adult Children           HH agency  [facility]   Status of service:  Completed, signed off Medicare Important Message given?  NO, please see note below. (If response is "NO", the following Medicare IM given date fields will be blank) Date Medicare IM given:   Date Additional Medicare IM given:    Discharge Disposition:  [place]  Per UR Regulation:    If discussed at Long Length of Stay Meetings, dates discussed:    Comments:   05/06/2014 Pt unresponsive and pt son has asked not to be disturbed, therefore IM not presented. Family awaiting delivery of DME to daughters home and plan to d/c pt to home today with Hospice services.  CRoyal RN MPH, case manager , [phone]   05/06/2014 family selected [facility], [name] notifed and this CM spoke with son re needs. [name] notifed of immediated need for hospital bed and alternating pressure mattress. She later visited with the pt son and arranged delivery of DME to home of pt daughter, [name] @ [address] [place], [place]. Ambulance transportation will be provided after DME is delivered. Per family request CSW [name] will request that the ambulance transport this pt past her home , prior to taking her the daughters home. Jasmine Pang RN MPH,  case manager, [phone]

## 2014-05-06 NOTE — Progress Notes (Signed)
Pt prepared for d/c to Home with Hospice. IV d/c'd, HD cath left in place. Skin intact except as most recently charted. Vitals are stable. Pt to be transported by ambulance service.  Jillyn Ledger, MBA, BS, RN

## 2014-05-06 NOTE — Telephone Encounter (Signed)
Evette from Hospice called to ask if Dr L would be willing to be attending MD for pt who is being DCd from [place] today on Hospice care. Spoke to Dr L, who stated that he has recently referred pt on to another PCP bc he didn't feel that he was best fit for pt's care any more. Pt just est care w/Dr Blanchie Serve. CB and advised Evette who will call Dr Bubba Camp.

## 2014-05-06 NOTE — Progress Notes (Signed)
I have seen and examined this patient and agree with plan as outlined by D. Zeyfang, PA-C.  Will sign off. Appreciate palliative care assistance. Broadus John A Jehan Ranganathan,MD 05/06/2014 11:08 AM

## 2014-05-06 NOTE — Progress Notes (Signed)
Nutrition Brief Note  Chart reviewed. Pt now transitioning to comfort care.  No further nutrition interventions warranted at this time.  Please re-consult as needed.   Inda Coke MS, RD, LDN Inpatient Registered Dietitian Pager: [phone] After-hours pager: [phone]

## 2014-05-06 NOTE — Progress Notes (Addendum)
Notified by Malachy Mood [facility] of family request for services of Hospice and [facility]) after discharge. Patient information reviewed with Dr. Alferd Patee, [place] Director, hospice elegible with dx of ESRD. Spoke with patient's daughter Vonette Grosso [phone]) via phone and initiated education related to hospice services, philosophy and team approach to care.  Per Malachy Mood and chart review pt is set to discharge today via non emergent transport. DME has been requested for delivery to pt's daughter Lisa's  home as soon as possible: Package D with half rails and BSC. Per Lattie Haw patient already has a rolling walker and cane. Patient's PCP is Dr. Jerilynn Mages. Pandey.  Pt needs OOF DNR and prescriptions for liquid morphine and lorazepam as per MAR.  Daughter Lattie Haw made aware that prescriptions will be sent with pt. [facility] referral center is aware of referral and need for admission today. Completed D/C summary will need to be faxed to [facility] referral center [phone] when final. Please notify [facility] when patient is ready to discharge [phone],( or [phone] if after 5pm)  [facility] contact information also given to [facility] via phone, and paper work with contact information left on patient's shadow chart.   Flo Shanks, RN 05/06/14, 12:20pm [facility] RN Liaison [phone]

## 2014-05-06 NOTE — Progress Notes (Signed)
Subjective:  No cos/ "not running any [place] today"/ hosp/pall care seeing for dc /No HD  Objective Vital signs in last 24 hours: Filed Vitals:   05/05/14 0847 05/05/14 1738 05/05/14 2053 05/06/14 0441  BP: 133/54 127/69 68/26 86/24   Pulse: 91 76 80 81  Temp: 99.1 F (37.3 C) 98.6 F (37 C) 98 F (36.7 C) 98.2 F (36.8 C)  TempSrc: Oral Axillary Oral Oral  Resp: 16 15 16 16   Height:      Weight:      SpO2: 95% 94% 93% 96%   Weight change:  Physical Exam  General: elderly WF thin, emaciated , NAD Heart: RRR  Lungs: CTA, decr. All fields, unlabored  Abdomen: soft, nontender +BS  Extremities: +2 LE edema, with bilat. Feet not unwrapped/RUA edema with AVF Dialysis Access: R IJ perm cath and RUA AVF +bruit (placed 3/16)   Dialysis Orders: TTS @ [facility]  4 hr 43kgs 4K/2.25Ca 180 400/800 R IJ  No heparin 0.5 calcitriol No ESA or Fe (tsat 59 5/14)   Assessment:  Assessment:  1. FTT- pt with steady decline in overall condition.Hospice/ Palliative care seeing , As noted per Dr. Jonnie Finner on Sunday 05/04/14 dw Family+  No further HD/ CMO. 2. DVT- R Lower ext. Subacute and L Lower ext chronic. IVC filter placed 5/21. No AC due to hx GIB 3. ESRD -n further HD / last HD  Sat 05/03/14 4. Hypertension/volume - bp 86/24 5. Nutrition - alb 1.3 .liberalizing Diet with poor po intake/ Nepro, multivit 6. Afib- amiodarone/ no coumadin sec. In past Gi bleed on apixiban 7. HO GI Bleed- stable hgb currently 8. Stage IV sacral d ecub- this is new, WC consulted 9. Dispo- as above agree with transition to hospice and Roland, PA-C [facility] Beeper [phone] 05/06/2014,8:00 AM  LOS: 9 days   Labs: Basic Metabolic Panel:  Recent Labs Lab 05/01/14 1257 05/03/14 1426  NA 136* 134*  K 3.4* 3.4*  CL 99 98  CO2 25 27  GLUCOSE 43* 108*  BUN 39* 34*  CREATININE 3.11* 3.27*  CALCIUM 7.8* 7.8*  PHOS 2.8 2.3   Liver Function Tests:  Recent Labs Lab 05/01/14 1257  05/03/14 1426  ALBUMIN 1.3* 1.3*    Recent Labs Lab 04/30/14 0805 05/01/14 0500 05/02/14 1300 05/03/14 0720 05/04/14 0935  WBC 6.3 5.8 6.4 7.3 8.7  HGB 9.7* 10.7* 10.6* 10.3* 9.0*  HCT 30.4* 32.1* 33.2* 32.7* 26.4*  MCV 96.2 93.6 95.4 96.5 92.0  PLT 155 200 149* 135* 144*   Cardiac Enzymes: No results found for this basename: CKTOTAL, CKMB, CKMBINDEX, TROPONINI,  in the last 168 hours CBG:  Recent Labs Lab 05/05/14 0728 05/05/14 1141 05/05/14 1736 05/05/14 2104 05/05/14 2344  GLUCAP 86 81 90 69* 109*    Studies/Results: No results found. Medications:   . amiodarone  100 mg Oral Daily  . antiseptic oral rinse  15 mL Mouth Rinse q12n4p  . atorvastatin  40 mg Oral QHS  . calcitRIOL  0.5 mcg Oral Q T,Th,Sat-1800  . calcium acetate  667 mg Oral TID WC  . carbamide peroxide  5 drop Both Ears BID  . chlorhexidine  15 mL Mouth Rinse BID  . collagenase   Topical Daily  . darbepoetin (ARANESP) injection - DIALYSIS  40 mcg Intravenous Q Tue-HD  . feeding supplement (PRO-STAT SUGAR FREE 64)  30 mL Oral BID  . heparin subcutaneous  5,000 Units Subcutaneous 3 times per day  .  levothyroxine  50 mcg Oral QAC breakfast  . mirtazapine  15 mg Oral QHS  . multivitamin  1 tablet Oral QHS  . pantoprazole  20 mg Oral Daily  . sodium chloride  3 mL Intravenous Q12H

## 2014-05-07 DIAGNOSIS — E039 Hypothyroidism, unspecified: Secondary | ICD-10-CM

## 2014-05-12 ENCOUNTER — Ambulatory Visit: Payer: Self-pay | Admitting: Pulmonary Disease

## 2014-05-12 ENCOUNTER — Inpatient Hospital Stay: Admission: RE | Admit: 2014-05-12 | Payer: Self-pay | Source: Ambulatory Visit

## 2014-05-12 DEATH — deceased

## 2014-05-21 ENCOUNTER — Encounter: Payer: Medicare Other | Admitting: Internal Medicine

## 2014-06-04 NOTE — Consult Note (Signed)
I have reviewed and discussed the care of this patient in detail with the nurse practitioner including pertinent patient records, physical exam findings and data. I agree with details of this encounter.  

## 2014-06-19 ENCOUNTER — Ambulatory Visit: Payer: Self-pay | Admitting: Physician Assistant

## 2014-07-21 ENCOUNTER — Ambulatory Visit: Payer: BLUE CROSS/BLUE SHIELD | Admitting: Nurse Practitioner
